# Patient Record
Sex: Female | Born: 1955 | Race: White | Hispanic: No | Smoking: Former smoker
Health system: Southern US, Community
[De-identification: ages and names within clinical notes are randomized; demographics above are authoritative.]

## PROBLEM LIST (undated history)

## (undated) DIAGNOSIS — Z803 Family history of malignant neoplasm of breast: Secondary | ICD-10-CM

## (undated) DIAGNOSIS — F329 Major depressive disorder, single episode, unspecified: Secondary | ICD-10-CM

## (undated) DIAGNOSIS — Z8051 Family history of malignant neoplasm of kidney: Secondary | ICD-10-CM

## (undated) DIAGNOSIS — F909 Attention-deficit hyperactivity disorder, unspecified type: Secondary | ICD-10-CM

## (undated) DIAGNOSIS — T4145XA Adverse effect of unspecified anesthetic, initial encounter: Secondary | ICD-10-CM

## (undated) DIAGNOSIS — D649 Anemia, unspecified: Secondary | ICD-10-CM

## (undated) DIAGNOSIS — T8859XA Other complications of anesthesia, initial encounter: Secondary | ICD-10-CM

## (undated) DIAGNOSIS — F419 Anxiety disorder, unspecified: Secondary | ICD-10-CM

## (undated) DIAGNOSIS — Z8041 Family history of malignant neoplasm of ovary: Secondary | ICD-10-CM

## (undated) DIAGNOSIS — Z800 Family history of malignant neoplasm of digestive organs: Secondary | ICD-10-CM

## (undated) DIAGNOSIS — R112 Nausea with vomiting, unspecified: Secondary | ICD-10-CM

## (undated) DIAGNOSIS — Z808 Family history of malignant neoplasm of other organs or systems: Secondary | ICD-10-CM

## (undated) DIAGNOSIS — Z9889 Other specified postprocedural states: Secondary | ICD-10-CM

## (undated) DIAGNOSIS — K589 Irritable bowel syndrome without diarrhea: Secondary | ICD-10-CM

## (undated) DIAGNOSIS — I10 Essential (primary) hypertension: Secondary | ICD-10-CM

## (undated) DIAGNOSIS — C801 Malignant (primary) neoplasm, unspecified: Secondary | ICD-10-CM

## (undated) DIAGNOSIS — F32A Depression, unspecified: Secondary | ICD-10-CM

## (undated) HISTORY — PX: FRACTURE SURGERY: SHX138

## (undated) HISTORY — DX: Anxiety disorder, unspecified: F41.9

## (undated) HISTORY — DX: Family history of malignant neoplasm of breast: Z80.3

## (undated) HISTORY — PX: APPENDECTOMY: SHX54

## (undated) HISTORY — DX: Major depressive disorder, single episode, unspecified: F32.9

## (undated) HISTORY — DX: Irritable bowel syndrome, unspecified: K58.9

## (undated) HISTORY — DX: Depression, unspecified: F32.A

## (undated) HISTORY — PX: ABDOMINAL HYSTERECTOMY: SHX81

## (undated) HISTORY — PX: BREAST SURGERY: SHX581

## (undated) HISTORY — DX: Family history of malignant neoplasm of ovary: Z80.41

## (undated) HISTORY — DX: Family history of malignant neoplasm of digestive organs: Z80.0

## (undated) HISTORY — DX: Family history of malignant neoplasm of other organs or systems: Z80.8

## (undated) HISTORY — PX: CHOLESTEATOMA EXCISION: SHX1345

## (undated) HISTORY — DX: Malignant (primary) neoplasm, unspecified: C80.1

## (undated) HISTORY — DX: Anemia, unspecified: D64.9

## (undated) HISTORY — DX: Family history of malignant neoplasm of kidney: Z80.51

## (undated) HISTORY — PX: TONSILLECTOMY: SUR1361

---
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
## 1993-04-19 HISTORY — PX: KIDNEY STONE SURGERY: SHX686

## 1993-04-19 HISTORY — PX: CHOLECYSTECTOMY: SHX55

## 1998-10-31 ENCOUNTER — Other Ambulatory Visit: Admission: RE | Admit: 1998-10-31 | Discharge: 1998-10-31 | Payer: Self-pay | Admitting: Obstetrics and Gynecology

## 1999-06-08 ENCOUNTER — Other Ambulatory Visit: Admission: RE | Admit: 1999-06-08 | Discharge: 1999-06-08 | Payer: Self-pay | Admitting: Obstetrics and Gynecology

## 1999-06-08 ENCOUNTER — Encounter (INDEPENDENT_AMBULATORY_CARE_PROVIDER_SITE_OTHER): Payer: Self-pay | Admitting: Specialist

## 1999-06-16 ENCOUNTER — Encounter (INDEPENDENT_AMBULATORY_CARE_PROVIDER_SITE_OTHER): Payer: Self-pay | Admitting: Specialist

## 1999-06-16 ENCOUNTER — Observation Stay (HOSPITAL_COMMUNITY): Admission: RE | Admit: 1999-06-16 | Discharge: 1999-06-17 | Payer: Self-pay | Admitting: Obstetrics and Gynecology

## 1999-11-17 ENCOUNTER — Other Ambulatory Visit: Admission: RE | Admit: 1999-11-17 | Discharge: 1999-11-17 | Payer: Self-pay | Admitting: Obstetrics and Gynecology

## 2000-03-17 ENCOUNTER — Encounter: Admission: RE | Admit: 2000-03-17 | Discharge: 2000-03-17 | Payer: Self-pay | Admitting: Obstetrics and Gynecology

## 2000-03-17 ENCOUNTER — Encounter: Payer: Self-pay | Admitting: Obstetrics and Gynecology

## 2000-03-18 ENCOUNTER — Encounter: Admission: RE | Admit: 2000-03-18 | Discharge: 2000-03-18 | Payer: Self-pay | Admitting: Obstetrics and Gynecology

## 2000-03-18 ENCOUNTER — Encounter: Payer: Self-pay | Admitting: Obstetrics and Gynecology

## 2000-08-12 ENCOUNTER — Encounter: Payer: Self-pay | Admitting: Obstetrics and Gynecology

## 2000-08-12 ENCOUNTER — Encounter: Admission: RE | Admit: 2000-08-12 | Discharge: 2000-08-12 | Payer: Self-pay | Admitting: Obstetrics and Gynecology

## 2000-09-07 ENCOUNTER — Encounter: Payer: Self-pay | Admitting: Obstetrics and Gynecology

## 2000-09-07 ENCOUNTER — Encounter: Admission: RE | Admit: 2000-09-07 | Discharge: 2000-09-07 | Payer: Self-pay | Admitting: Obstetrics and Gynecology

## 2001-01-09 ENCOUNTER — Encounter: Admission: RE | Admit: 2001-01-09 | Discharge: 2001-01-09 | Payer: Self-pay | Admitting: Surgery

## 2001-01-09 ENCOUNTER — Encounter: Payer: Self-pay | Admitting: Surgery

## 2007-09-22 ENCOUNTER — Encounter: Admission: RE | Admit: 2007-09-22 | Discharge: 2007-09-22 | Payer: Self-pay | Admitting: Obstetrics and Gynecology

## 2007-09-26 ENCOUNTER — Encounter: Admission: RE | Admit: 2007-09-26 | Discharge: 2007-09-26 | Payer: Self-pay | Admitting: Obstetrics and Gynecology

## 2007-09-27 ENCOUNTER — Ambulatory Visit (HOSPITAL_COMMUNITY): Admission: RE | Admit: 2007-09-27 | Discharge: 2007-09-28 | Payer: Self-pay | Admitting: Obstetrics and Gynecology

## 2008-10-16 ENCOUNTER — Encounter: Admission: RE | Admit: 2008-10-16 | Discharge: 2008-10-16 | Payer: Self-pay | Admitting: Obstetrics and Gynecology

## 2009-02-11 ENCOUNTER — Ambulatory Visit (HOSPITAL_COMMUNITY): Admission: RE | Admit: 2009-02-11 | Discharge: 2009-02-12 | Payer: Self-pay | Admitting: Orthopedic Surgery

## 2010-05-10 ENCOUNTER — Encounter: Payer: Self-pay | Admitting: Obstetrics and Gynecology

## 2010-07-23 LAB — CBC
HCT: 38.5 % (ref 36.0–46.0)
Hemoglobin: 13.3 g/dL (ref 12.0–15.0)
Platelets: 251 10*3/uL (ref 150–400)
WBC: 7.1 10*3/uL (ref 4.0–10.5)

## 2010-07-23 LAB — BASIC METABOLIC PANEL
CO2: 27 mEq/L (ref 19–32)
Calcium: 9.9 mg/dL (ref 8.4–10.5)
Creatinine, Ser: 0.76 mg/dL (ref 0.4–1.2)
GFR calc Af Amer: >60 mL/min (ref >60)
GFR calc non Af Amer: >60 mL/min (ref >60)
Potassium: 4 mEq/L (ref 3.5–5.1)
Sodium: 137 mEq/L (ref 135–145)

## 2010-09-01 NOTE — Op Note (Signed)
NAMELIBA, HULSEY              ACCOUNT NO.:  [id number]   MEDICAL RECORD NO.:  [id number]          PATIENT TYPE:  OIB   LOCATION:  9312                          FACILITY:  [facility]   PHYSICIAN:  Janine Limbo, M.D.DATE OF BIRTH:  02-17-1956   DATE OF PROCEDURE:  09/27/2007  DATE OF DISCHARGE:                               OPERATIVE REPORT   PREOPERATIVE DIAGNOSES:  1. 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  2. Stress urinary incontinence.   POSTOPERATIVE DIAGNOSES:  1. 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  2. Stress urinary incontinence.   PROCEDURE:  1. Anterior and posterior colporrhaphy.  2. Tension-free vaginal tape using the TVT Secur system.   SURGEON:  Janine Limbo, MD   FIRST ASSISTANT:  Naima A. Dillard, MD   ANESTHETIC:  General.   DISPOSITION:  Ms. Robertshaw is a 55 year old female, para 1-0-1-1, who  presents with the above-mentioned diagnosis.  She understands the  indications for her surgical procedure and she also understands the  alternative treatment options.  She accepts the risks, but not limited  to, anesthetic complications, bleeding, infections, and possible damage  to the surrounding organs.  She understands that there is a risk of  urinary retention and long-term catheterization.  She also understands  that there is a risk of mesh erosion.  The patient was given vaginal  estrogen to use prior to her surgery, but she did not use the medication  saying that she was too nervous.  We had previously discussed the risk  and benefits associated with vaginal estrogen and these risk and  benefits were again discussed immediately preoperatively.  The patient  agreed that she would use the medication postoperatively.   FINDINGS:  The patient is status post hysterectomy and therefore the  cervix and uterus were not present.  The patient had a moderate  to large  cystocele and a large rectocele present.  There was loss of  urethrovesical angle.  No adnexal masses were appreciated.   PROCEDURE:  The patient was taken to the operating room where a general  anesthetic was given.  The patient's abdomen, perineum, and vagina were  prepped with multiple layers of Betadine.  The patient was sterilely  draped.  Examination under anesthesia was performed.  A Foley catheter  was placed in the bladder.  The cystocele was then inspected.  The  vaginal mucosa beneath the bladder was then injected with a diluted  solution of Pitressin, saline, and 0.25% Marcaine.  An incision was made  in the vaginal mucosa and the vaginal mucosa was bluntly and sharply  separated from the underlying supporting fascia and bladder.  Care was  taken not to damage the bladder itself.  The dissection was carried up  to the level of urethrovesical angle.  The urethrovesical angle was then  supported using figure-of-eight sutures of 0 Vicryl.  The fascia was  reapproximated in the midline using interrupted sutures of 2-0 chromic  and 0 chromic.  At  the end of our procedure, the bladder was noted to be  well supported.  The excess vaginal mucosa was excised.  The anterior  vaginal mucosa was closed using interrupted sutures of 0 Vicryl.  Attention was then turned to the rectocele.  The vaginal mucosa that  supports the rectum was then injected with a diluted solution of  Pitressin, saline, and 0.25% Marcaine.  An incision was made in the  perineal body and the vaginal mucosa were sharply and bluntly dissected  from the underlying fascia that supports the rectum.  The fascia was  then reapproximated in the midline using interrupted sutures of 0  chromic.  The perineal body was supported using interrupted sutures of 0  Vicryl.  The excess vaginal mucosa was excised.  The vaginal mucosa was  then closed using a running suture of 0 Vicryl to the level of the  introitus.  We  then used interrupted sutures on the perineal body to  close the skin of the perineum.  The vagina was noted to be the width of  2 fingers.  Hemostasis was noted to be adequate.  We were now ready to  turn our attention to the tension-free vaginal tape portion of the  procedure.  An incision was made 1 cm from the urethral meatus for a  length of 1-1-1/2 cm.  The patient's hips were placed at a 90 degrees  angle.  The vagina was undermined at the level of the incision for  approximately 1-1/2 cm.  We then used the TVT Secur system and we angled  the first arm of the device toward the ischial pubic ramus.  We then  came in contact with the anterior edge of the pubic ramus and we  advanced the TVT Secur device into the obturator internus muscle.  An  identical procedure was carried out on the opposite side.  We checked  the tension of the vaginal tape and it was felt to be appropriate.  It  was noted to be not too loose nor too tight.  The device was then  disconnected from the inserting arms.  A final inspection was made and  again the device was felt to be in an appropriate position with  appropriate tension.  The vaginal mucosa was closed using interrupted  sutures of 0 Vicryl.  Hemostasis was noted to be adequate at this point.  The patient was given an ampule of indigo carmine dye.  The cystoscope  was inserted and the bladder was carefully inspected.  There was no  evidence of damage to the bladder and there was no evidence of mesh  material within the bladder.  Dye was noted to quickly and easily pass  through both ureteral orifices.  The Foley catheter was reinserted.  The  vagina was then packed with gauze that was soaked with estrogen vaginal  cream.  The patient was returned to the supine position.  She was  awakened from her anesthetic without difficulty.  She tolerated her  procedure well.  Sponge, needle, and instrument counts were correct on 2  occasions.  The estimated blood  loss for the procedure was 150 mL.  The  patient was noted to drain a slightly green tinged urine at this point  (blue dye plus yellow urine).  There were no specimens to be sent to  pathology.      Janine Limbo, M.D.  Electronically Signed     AVS/MEDQ  D:  09/27/2007  T:  09/28/2007  Job:  [id number]

## 2010-09-01 NOTE — H&P (Signed)
Catherine Cole, Catherine Cole              ACCOUNT NO.:  [id number]   MEDICAL RECORD NO.:  [id number]          PATIENT TYPE:  AMB   LOCATION:  SDC                           FACILITY:  [facility]   PHYSICIAN:  Janine Limbo, M.D.DATE OF BIRTH:  1955-08-31   DATE OF ADMISSION:  DATE OF DISCHARGE:                              HISTORY & PHYSICAL   HISTORY OF PRESENT ILLNESS:  Ms. Deupree is a 55 year old female, para  1-0-1-1, who presents for an anterior and posterior colporrhaphy with  placement of a TVT SECUR System .  The patient has been followed at the  [facility].  The patient complains of stress urinary incontinence  with pelvic pressure symptoms.  The patient is status post vaginal  hysterectomy from 2001.  The patient had a cholecystectomy in 1993.  She  has also had a dilatation and curettage in the past for irregular  bleeding.  The patient has had a negative urine culture.  Her gonorrhea  and chlamydia cultures are negative.   PAST MEDICAL HISTORY:  The patient has a history of anxiety and a  history of attention deficit disorder.  She has had a tonsillectomy in  the past.  The patient currently takes Valtrex for a herpes infection.   DRUG ALLERGIES:  The patient reports that she is allergic to DEMEROL,  CODEINE, and NEOSPORIN.   SOCIAL HISTORY:  The patient is a cigarette smoker.  She drinks alcohol  socially.  She denies other recreational drug uses.   OBSTETRICAL HISTORY:  The patient has had 1 vaginal delivery at term.   REVIEW OF SYSTEMS:  Please see history of present illness.   FAMILY HISTORY:  The patient's aunt had throat cancer.   PHYSICAL EXAM:  VITAL SIGNS: Weight is 167 pounds.  HEENT: Within normal limits.  CHEST: Clear.  HEART: Regular rate and rhythm.  BREASTS: Without masses.  ABDOMEN: Nontender.  EXTREMITIES: Grossly normal.  NEUROLOGIC: Grossly normal.  PELVIC: External genitalia  is normal. The vagina shows a cystocele and  rectocele with loss of urethrovesical angle. There are slight atrophic  changes present.  The cervix is surgically absent.  The uterus is  surgically absent.  Adnexa, no masses.  Rectovaginal exam confirmed and  there is good muscle tone to the rectal sphincter.   ASSESSMENT:  1. Pelvic relaxation.  2. Stress urinary incontinence.  3. Cystocele and rectocele with loss of urethrovesical angle.   PLAN:  The patient will undergo an anterior and posterior colporrhaphy  as well as placement of a tension free vaginal tape using the secured  system.  She understands the indications for her surgical procedure and  she accepts the risks, but not limited to, anesthetic complications,  bleeding, infections, and possible damage to the surrounding organs.  She understands that there is a risk for urinary retention and that she  may require a longer period of catheterization.  She also understands  that there is a risk of mesh erosion of the vagina associated with her  TVT SECUR System .  We have  started the patient on vaginal estrogen with  hopes of improving her surgical outcome and minimizing the chance of  vaginal erosion.  She will continue this postoperatively.  The patient  was also given her postoperative medications which include Darvocet-N  100 and she will take 1 tablet every 4 to 6 hours as needed for pain.  She was given 1 refill.  She was given a total of 50 tablets.  She was  given Motrin 800 mg and she will take 1 tablet q. 8h. as needed for mild  to moderate pain.  She was given 50 tablets of 1 refill.  She was given  Estrace vaginal cream 0.01%.  She was given 1 tube and she will use a  quarter an applicator each day for 2 weeks after her surgery and then  every other day for 2-4 weeks after her surgery.  She was given her  choice of Phenergan 25 mg q. 6h. p.r.n. nausea or Zofran 8 mg q. 8h.  p.r.n. nausea.      Janine Limbo,  M.D.  Electronically Signed     AVS/MEDQ  D:  09/26/2007  T:  09/27/2007  Job:  [id number]

## 2010-09-04 NOTE — Discharge Summary (Signed)
[facility]  Patient:    Catherine Cole, Catherine Cole                     MRN: [id number] Adm. Date:  [id number] Disc. Date: [id number] Attending:  Leonard Schwartz Dictator:   Henreitta Leber, P.A.                           Discharge Summary  DISCHARGE DIAGNOSES:          Fibroid uterus, menorrhagia, dysmenorrhea, and anemia.  PROCEDURE:                    On the day of admission, patient underwent a total vaginal hysterectomy with uterine morcellation.  HISTORY OF PRESENT ILLNESS:   Catherine Cole is a 55 year old female para 1-0-1-1 who presents for a vaginal hysterectomy because of menorrhagia and fibroids. Please see patients dictated History and Physical Examination for details.  PHYSICAL EXAMINATION:  HEIGHT AND WEIGHT:            Weight 152 pounds.  Height 5 feet six inches tall.  GENERAL:                      Within normal limits.  PELVIC:                       EG/BUS is within normal limits.  Vagina is normal  except there is a small cystocele and small rectocele.  The cervix is nontender and there is no lesion.  The uterus is 10-12 week size, irregular and firm.  Adnexa  without masses.  Rectovaginal exam confirms.  HOSPITAL COURSE:              On date of admission, patient underwent a total vaginal hysterectomy with uterine morcellation, tolerating procedure well. Postoperative course was unremarkable, with patient quickly tolerating a regular diet and resuming bowel and bladder functions by postoperative day #1. Postoperative hemoglobin was 9.0 (preoperative hemoglobin 11.1).  Patient was felt to have received maximum benefit of hospital stay and was therefore discharged n postoperative day #1 to home.  DISCHARGE MEDICATIONS:        1. Darvocet-N 100 1 tablet q.4-6h. for pain.                               2. Iron 325 mg 1 tablet b.i.d. for six weeks.                               3. Phenergan 25 mg 1 tablet q.6h. as needed                             for nausea.  FOLLOW-UP:                    Patient is to call [facility] for an appointment with Dr. Stefano Gaul in six weeks for postoperative evaluation.  DISCHARGE INSTRUCTIONS:       Patient was given a copy of [facility] postoperative instruction sheet.  She was further advised to call for a temperature greater 101 or any severe problems or questions.  She was further advised to avoid driving for one week, heavy lifting  for four weeks, and intercourse for six weeks.  FINAL PATHOLOGY:              A uterine cervix with benign ectocervical and endocervical tissue, benign endocervical inclusion cyst, uterine corpus with proliferative endometrium, intramural leiomyomata - multiple, and adenomyosis. DD:  07/20/99 TD:  07/20/99 Job: [id number]

## 2010-09-04 NOTE — Discharge Summary (Signed)
NAMESHELBI, VACCARO              ACCOUNT NO.:  [id number]   MEDICAL RECORD NO.:  [id number]          PATIENT TYPE:  OIB   LOCATION:  9312                          FACILITY:  [facility]   PHYSICIAN:  Janine Limbo, M.D.DATE OF BIRTH:  Jul 06, 1955   DATE OF ADMISSION:  09/27/2007  DATE OF DISCHARGE:  09/28/2007                               DISCHARGE SUMMARY   DISCHARGE DIAGNOSES:  1. Pelvic relaxation.  2. Stress urinary incontinence.  3. Cystocele and rectocele.   OPERATION:  On the day of admission, the patient underwent an anterior  and posterior colporrhaphy and placement of tension-free vaginal tape  using the SECUR systems tolerating all procedures well.   HISTORY OF PRESENT ILLNESS:  Catherine Cole is a 55 year old female para 1-  0-1-1, presenting for anterior-posterior colporrhaphy with placement of  tension-free vaginal tape using the SECUR system due to symptomatic  pelvic relaxation.  Please see the patient's dictated history and  physical examination for details.   PREOPERATIVE PHYSICAL EXAM:  Weight is 167 pounds.  General exam was  within normal limits.  Pelvic exam external genitalia was normal.  Vagina showed a cystocele and rectocele with loss of the urethrovesical  angle.  There was slight atrophic changes present.  The cervix was  surgically absent.  Uterus was surgically absent.  Adnexa did not reveal  any masses and the rectovaginal exam confirmed that there was good  muscle tone to the rectal sphincter.   HOSPITAL COURSE:  On the day of admission, the patient underwent  aforementioned procedures tolerating them all well.  The patient  tolerated postop hemoglobin of 11.2 (preop hemoglobin 13.9) and had  resumed bowel and bladder function by postop day #1.  Having received  the maximum benefit of her hospital stay, the patient was therefore  discharged on postop day #1.   DISCHARGE MEDICATIONS:  1. Motrin 800 mg 1 tablet every 8 hours as needed.  2.  Darvocet-N 100 one tablet every 4-6 hours as needed for pain.  3. Zofran 8 mg 1 tablet every 8 hours as needed for nausea.  4. Estrace vaginal cream, the patient is to use 1/4 applicator full      placed on her finger applied inside the vagina day and night for 2      weeks, then every other day for 4 weeks.   FOLLOWUP:  The patient is to call [facility] at [phone] to  schedule a 6 weeks postoperative visit with Dr. Stefano Cole.   DISCHARGE INSTRUCTIONS:  1. The patient was given a copy of [facility]      postoperative instruction sheet.  2. She was further advised to avoid driving for 1-2 weeks, heavy      lifting for 4 weeks, intercourse for 6 weeks, but      she may shower, she may walk up steps.  3. She should increase her activities slowly.  4. Her diet was without restriction.      Catherine Cole.      Janine Limbo, M.D.  Electronically Signed    EJP/MEDQ  D:  10/12/2007  T:  10/13/2007  Job:  [id number]

## 2010-09-04 NOTE — Op Note (Signed)
[facility]  Patient:    Catherine Cole, Catherine Cole                     MRN: [id number] Proc. Date: 06/16/99 Adm. Date:  [id number] Attending:  Leonard Schwartz                           Operative Report  PREOPERATIVE DIAGNOSIS:       Fibroid uterus.  Menorrhagia.  Dysmenorrhea. Anemia (hemoglobin 11.1).  POSTOPERATIVE DIAGNOSIS:      Fibroid uterus.  Menorrhagia.  Dysmenorrhea. Anemia (hemoglobin 11.1).  OPERATION:                    Total vaginal hysterectomy with uterine morsilation.  SURGEON:                      Janine Limbo, M.D.  ASSISTANT:                    Henreitta Leber, P.A.  ANESTHESIA:                   Epidural.  ESTIMATED BLOOD LOSS:  INDICATIONS:                  [name] is a 55 year old who presents with the abovementioned diagnoses.  Her endometrial biopsy was negative.  She understands the indications for her procedure and she accepts the risks of, but not limited to, anesthetic complications, bleeding, infection, and possible damage to the surrounding organs.  FINDINGS:                     The uterus was 12 to 14 weeks size and it contained multiple fibroids.  The fallopian tubes and ovaries appeared normal.  DESCRIPTION OF PROCEDURE:     The patient was taken to the operating room where an epidural catheter was placed.  The patients abdomen, perineum, and vagina were prepped with multiple layers of Betadine.  A Foley catheter was placed in the bladder.  The patient was sterilely draped.  The cervix was injected with a diluted solution of Pitressin and saline.  A circumferential incision was made around the cervix and the mucosa was advanced both anteriorly and posteriorly.  The anterior cul-de-sac and then the posterior cul-de-sac were sharply entered.  Alternating  from left to right, the uterosacral ligaments, paracervical tissues, parametrial tissues, and uterine arteries were clamped, cut,  sutured, and tied securely. We were unable to invert the uterus through the posterior colpotomy.  Progressive uterine morsilation was required with removal of fibroids and uterine tissue so  that we could remove the large uterus.  Bleeding was scant.  We were finally able to invert the uterus through the posterior colpotomy and the upper pedicles were secured.  The uterus was transected from our operative field.  The upper pedicles were then free-tied, and suture ligated.  Hemostasis was noted to be adequate. The sutures attached to the uterosacral ligaments were brought out through the vaginal angles and tied securely.  A McCall culdoplasty suture was placed in the posterior cul-de-sac incorporating the uterosacral ligaments and the posterior peritoneum.  A final check was made for hemostasis and again hemostasis was noted to be adequate. Sponge, needle, and instrument counts were correct x 2 occasions.  All instruments were removed.  The vaginal cuff was closed using figure-of-eight sutures.  The [employer]  culdoplasty suture was tied securely and the apex of the vagina was noted to elevate into the midpelvis.  0 Vicryl was the suture material used throughout the procedure.  The patient was noted to drain clear yellow urine at the end of her  procedure.  The patient was taken to the recovery room in stable condition. She tolerated her procedure well. DD:  06/16/99 TD:  06/16/99 Job: [id number]

## 2011-01-14 LAB — BASIC METABOLIC PANEL
BUN: 15
Chloride: 98
GFR calc Af Amer: >60
GFR calc non Af Amer: >60
Potassium: 4

## 2011-01-14 LAB — URINALYSIS, ROUTINE W REFLEX MICROSCOPIC
Nitrite: NEGATIVE
Protein, ur: NEGATIVE
Urobilinogen, UA: 0.2
pH: 5

## 2011-01-14 LAB — CBC
HCT: 39.3
Hemoglobin: 11.2 — ABNORMAL LOW
MCHC: 35.5
MCV: 90.8
RBC: 3.47 — ABNORMAL LOW
RDW: 12.4
WBC: 10.3
WBC: 6.7

## 2011-01-14 LAB — URINE MICROSCOPIC-ADD ON

## 2011-07-01 ENCOUNTER — Other Ambulatory Visit: Payer: Self-pay | Admitting: Physician Assistant

## 2011-07-01 MED ORDER — ESCITALOPRAM OXALATE 20 MG PO TABS: 20.0000 mg | ORAL_TABLET | Freq: Every day | ORAL | Status: DC

## 2011-07-07 ENCOUNTER — Ambulatory Visit (INDEPENDENT_AMBULATORY_CARE_PROVIDER_SITE_OTHER): Payer: BC Managed Care – PPO | Admitting: Physician Assistant

## 2011-07-07 VITALS — BP 155/92 | HR 74 | Temp 98.5°F | Resp 16 | Ht 65.0 in | Wt 180.2 lb

## 2011-07-07 DIAGNOSIS — F339 Major depressive disorder, recurrent, unspecified: Secondary | ICD-10-CM

## 2011-07-07 DIAGNOSIS — F909 Attention-deficit hyperactivity disorder, unspecified type: Secondary | ICD-10-CM

## 2011-07-07 DIAGNOSIS — F419 Anxiety disorder, unspecified: Secondary | ICD-10-CM

## 2011-07-07 DIAGNOSIS — F988 Other specified behavioral and emotional disorders with onset usually occurring in childhood and adolescence: Secondary | ICD-10-CM

## 2011-07-07 DIAGNOSIS — F411 Generalized anxiety disorder: Secondary | ICD-10-CM

## 2011-07-07 DIAGNOSIS — F329 Major depressive disorder, single episode, unspecified: Secondary | ICD-10-CM

## 2011-07-07 MED ORDER — AMPHETAMINE-DEXTROAMPHETAMINE 10 MG PO TABS: 10.0000 mg | ORAL_TABLET | Freq: Two times a day (BID) | ORAL | Status: DC

## 2011-07-07 NOTE — Progress Notes (Signed)
  Subjective:    Patient ID: Catherine Cole, female    DOB: 17-Jul-1955, 56 y.o.   MRN: [id number]  HPI  Catherine Cole comes in today to discuss reinitiating ADD meds. Currently on Lexapro and feels "happy" but overwhelmed all the time.  She had been tested ~ 20 yrs ago for ADD (no documentation provided) and put on Ritalin and then switched to Adderall up until ~ 6yrs ago.  Husband died 9 yrs ago and lost everything.  She had to stop all her meds for depression and ADD because of financial reasons.  She is now doing very well and is working and going to school at [employer] time.  She will graduate this May.  While she is doing well with lexapro, she feels that she has been unable to focus and accomplish tasks and assignments.  She is not sleeping well. In addition to Lexapro, she was also put on Wellbutrin and was on all 3 meds at one time. She had also used Vitamins that she says helped.  She had stopped taking HCTZ for pedal edema as she made diet changes that reduced her Sodium intake.  She states she has never had HTN.    Review of Systems  Constitutional: Positive for fatigue and unexpected weight change. Negative for appetite change.  Cardiovascular: Positive for leg swelling. Negative for chest pain and palpitations.  Neurological: Negative for dizziness and light-headedness.  Psychiatric/Behavioral: Positive for sleep disturbance. Negative for suicidal ideas and self-injury. The patient is nervous/anxious.        Objective:   Physical Exam  Constitutional: She is oriented to person, place, and time. She appears well-developed and well-nourished.  Cardiovascular: Normal rate and regular rhythm.   Pulmonary/Chest: Effort normal.  Neurological: She is alert and oriented to person, place, and time.  Skin: Skin is warm.        Assessment & Plan:  ADD Depression Anxiety  Continue Lexapro Add Adderall 10 mg bid.  Pt to call for RF in one month and notify us of her status.   She will need CPE in the next 2-3 months when classes finish. She is aggreable to this.

## 2011-07-07 NOTE — Patient Instructions (Signed)
Call in one month for refill and status check Plan on scheduling a complete physical in 2-3 months.

## 2011-07-15 ENCOUNTER — Telehealth: Payer: Self-pay

## 2011-07-15 NOTE — Telephone Encounter (Signed)
Pt pick up rx for adderall on Thursday of last week and went to pharmacy and pharmacy states they need authorization from her insurance this info was faxed to [facility], pt trying to figure out whats the hold up.Marland Kitchen

## 2011-07-19 NOTE — Telephone Encounter (Signed)
Called [employer] and prior Berkley Harvey was approved [id number]).  Patient notfied and will notify pharmacy.

## 2011-07-19 NOTE — Telephone Encounter (Signed)
Yes it is attached to her chart.

## 2011-08-22 ENCOUNTER — Telehealth: Payer: Self-pay

## 2011-08-22 ENCOUNTER — Ambulatory Visit (INDEPENDENT_AMBULATORY_CARE_PROVIDER_SITE_OTHER): Payer: BC Managed Care – PPO | Admitting: Family Medicine

## 2011-08-22 ENCOUNTER — Encounter: Payer: Self-pay | Admitting: Family Medicine

## 2011-08-22 VITALS — BP 131/79 | HR 87 | Temp 98.4°F | Resp 16 | Ht 66.0 in | Wt 177.0 lb

## 2011-08-22 DIAGNOSIS — R609 Edema, unspecified: Secondary | ICD-10-CM

## 2011-08-22 DIAGNOSIS — F411 Generalized anxiety disorder: Secondary | ICD-10-CM

## 2011-08-22 DIAGNOSIS — R600 Localized edema: Secondary | ICD-10-CM

## 2011-08-22 DIAGNOSIS — F419 Anxiety disorder, unspecified: Secondary | ICD-10-CM

## 2011-08-22 DIAGNOSIS — F988 Other specified behavioral and emotional disorders with onset usually occurring in childhood and adolescence: Secondary | ICD-10-CM

## 2011-08-22 MED ORDER — AMPHETAMINE-DEXTROAMPHETAMINE 10 MG PO TABS: 10.0000 mg | ORAL_TABLET | Freq: Two times a day (BID) | ORAL | Status: DC

## 2011-08-22 MED ORDER — HYDROCHLOROTHIAZIDE 12.5 MG PO CAPS: 12.5000 mg | ORAL_CAPSULE | Freq: Every day | ORAL | Status: DC

## 2011-08-22 NOTE — Telephone Encounter (Signed)
Pt was told she would need to come for a refill on adderall due to her having finals for her classed and working a ft job she has'nt been able to make an appt and would like to see if she could get a rx refilll on her adderall and make an appt at a later date.

## 2011-08-22 NOTE — Progress Notes (Signed)
  Subjective:    Patient ID: Catherine Cole, female    DOB: 05/07/1955, 56 y.o.   MRN: [id number]  HPI Catherine Cole is a 56 y.o. female Here for multiple concerns today;  See last ov 07/07/11: ADD, Depression, Anxiety.  Continued Lexapro Added Adderall 10 mg bid.    Per last office visit: overwhelmed all the time.  She had been tested ~ 20 yrs ago for ADD (no documentation provided) and put on Ritalin and then switched to Adderall up until ~ 36yrs ago.  Husband died 9 yrs ago and lost everything.  She had to stop all her meds for depression and ADD because of financial reasons.  She is now doing very well and is working and going to school at [employer] time.  She will graduate this May.  While she is doing well with lexapro, she feels that she has been unable to focus and accomplish tasks and assignments.  She is not sleeping well. In addition to Lexapro, she was also put on Wellbutrin and was on all 3 meds at one time. She had also used Vitamins that she says helped. Per last note plan for CPE as classes finish. Working and school full time. [employer]. Has been doing well on current dose of Adderall.  Able to focus much better, more calm.  No side effects.  No appetite difficulty. No insomnia, no chest pain/palpitations.  Sleeping better. Has been able to lose a few pounds as not eating late at night.  wiser food choices.  Fluid retention in legs - prior started on HCTZ for pedal edema, had normal doppler in 2010. No recent hctz - off x 6 months.  Noticing some fluid in ankles.  Trying to prop up feet during day.  Anxiety - on Lexapro 10mg  QD. Doing well.  No depression, no SI. Not feeling anxiety and panic symptoms as in past. No manic symptoms.    Review of Systems  Constitutional: Negative for fever and chills.  Respiratory: Negative for chest tightness and shortness of breath.   Cardiovascular: Positive for leg swelling. Negative for palpitations.       Swelling at ankles.   and as per HPI.    Objective:   Physical Exam  Constitutional: She is oriented to person, place, and time. She appears well-developed and well-nourished.  HENT:  Head: Normocephalic and atraumatic.  Cardiovascular: Normal rate, regular rhythm, normal heart sounds and intact distal pulses.   No murmur heard. Pulmonary/Chest: Effort normal and breath sounds normal.  Musculoskeletal:       Trace/minimal edema at ankles only. No pretibial edema or rash.  Neurological: She is alert and oriented to person, place, and time.  Skin: Skin is warm and dry.  Psychiatric: She has a normal mood and affect. Her behavior is normal.       Assessment & Plan:    ADD - well controlled  By report.  Cont adderall at current dose - 10mg  BID. 2 months Rx given.  Plan to follow up for CPE for continued RX's.  Can continue as helping at work as well.   Anxiety - well controlled by report.  Cont lexapro at current dose.  Continue healthy habits for weight loss.  Trace pedal edema - minimal sx's currently.  Can continue to elevate legs, and if not improving as less walking/standing can restart HCTZ 12.5mg  QD prn. SED, orthostatic precautions.  Recheck next 2 months for CPE.

## 2011-08-22 NOTE — Telephone Encounter (Signed)
ADVISED PT THAT SHE CAN COME IN TODAY AND BE SEEN. PT AGREED

## 2011-10-21 ENCOUNTER — Other Ambulatory Visit: Payer: Self-pay | Admitting: Family Medicine

## 2011-11-25 ENCOUNTER — Telehealth: Payer: Self-pay

## 2011-11-25 NOTE — Telephone Encounter (Signed)
PT REQUESTING ADDERALL REFILL  BEST PHONE [phone]

## 2011-11-26 MED ORDER — AMPHETAMINE-DEXTROAMPHETAMINE 10 MG PO TABS: 10.0000 mg | ORAL_TABLET | Freq: Two times a day (BID) | ORAL | Status: DC

## 2011-11-26 NOTE — Telephone Encounter (Signed)
Patient is due for follow up according to Dr Neva Seat note she needs a physical, I have spoken to her and she has no plans for this due to busy time at work, would be September before she could get in for this, I told her I will check and see if we can renew until then. Please advise.

## 2011-11-26 NOTE — Telephone Encounter (Signed)
Pt called again, she scheduled an appt with Dr. Audria Nine on September 4 for CPE, but would like to still get the Adderall refilled.  Pt has 2 numbers, [phone] or [phone].

## 2011-11-26 NOTE — Telephone Encounter (Signed)
LMOM notifying patient rx in pick up drawer.

## 2011-11-26 NOTE — Telephone Encounter (Signed)
At TL desk 

## 2011-12-07 ENCOUNTER — Telehealth: Payer: Self-pay

## 2011-12-07 ENCOUNTER — Ambulatory Visit (INDEPENDENT_AMBULATORY_CARE_PROVIDER_SITE_OTHER): Payer: BC Managed Care – PPO | Admitting: Family Medicine

## 2011-12-07 ENCOUNTER — Ambulatory Visit: Payer: BC Managed Care – PPO

## 2011-12-07 VITALS — BP 136/80 | HR 88 | Temp 98.7°F | Resp 18 | Ht 65.5 in | Wt 164.8 lb

## 2011-12-07 DIAGNOSIS — R5383 Other fatigue: Secondary | ICD-10-CM

## 2011-12-07 DIAGNOSIS — R61 Generalized hyperhidrosis: Secondary | ICD-10-CM

## 2011-12-07 DIAGNOSIS — R079 Chest pain, unspecified: Secondary | ICD-10-CM

## 2011-12-07 DIAGNOSIS — IMO0001 Reserved for inherently not codable concepts without codable children: Secondary | ICD-10-CM

## 2011-12-07 DIAGNOSIS — R5381 Other malaise: Secondary | ICD-10-CM

## 2011-12-07 DIAGNOSIS — R0989 Other specified symptoms and signs involving the circulatory and respiratory systems: Secondary | ICD-10-CM

## 2011-12-07 DIAGNOSIS — R0609 Other forms of dyspnea: Secondary | ICD-10-CM

## 2011-12-07 DIAGNOSIS — Z13220 Encounter for screening for lipoid disorders: Secondary | ICD-10-CM

## 2011-12-07 DIAGNOSIS — R0600 Dyspnea, unspecified: Secondary | ICD-10-CM

## 2011-12-07 DIAGNOSIS — R110 Nausea: Secondary | ICD-10-CM

## 2011-12-07 LAB — LIPID PANEL
Cholesterol: 219 mg/dL — ABNORMAL HIGH (ref 0–200)
HDL: 43 mg/dL (ref >39)
LDL Cholesterol: 142 mg/dL — ABNORMAL HIGH (ref 0–99)
Total CHOL/HDL Ratio: 5.1 ratio
Triglycerides: 170 mg/dL — ABNORMAL HIGH (ref <150)
VLDL: 34 mg/dL (ref 0–40)

## 2011-12-07 LAB — COMPREHENSIVE METABOLIC PANEL WITH GFR
BUN: 16 mg/dL (ref 6–23)
Calcium: 9.8 mg/dL (ref 8.4–10.5)
Chloride: 100 meq/L (ref 96–112)
Creat: 0.76 mg/dL (ref 0.50–1.10)
Sodium: 139 meq/L (ref 135–145)
Total Bilirubin: 0.5 mg/dL (ref 0.3–1.2)

## 2011-12-07 LAB — POCT CBC
Granulocyte percent: 61.9 % (ref 37–80)
HCT, POC: 41 % (ref 37.7–47.9)
Hemoglobin: 12.7 g/dL (ref 12.2–16.2)
Lymph, poc: 2.1 (ref 0.6–3.4)
MCH, POC: 27.7 pg (ref 27–31.2)
MCHC: 31 g/dL — AB (ref 31.8–35.4)
MCV: 89.6 fL (ref 80–97)
MID (cbc): 0.5 (ref 0–0.9)
MPV: 9.6 fL (ref 0–99.8)
POC Granulocyte: 4.2 (ref 2–6.9)
POC LYMPH PERCENT: 30.8 % (ref 10–50)
POC MID %: 7.3 %M (ref 0–12)
Platelet Count, POC: 282 10*3/uL (ref 142–424)
RBC: 4.58 M/uL (ref 4.04–5.48)
RDW, POC: 13.7 %
WBC: 6.8 10*3/uL (ref 4.6–10.2)

## 2011-12-07 LAB — COMPREHENSIVE METABOLIC PANEL
ALT: 12 U/L (ref 0–35)
AST: 11 U/L (ref 0–37)
Albumin: 4.5 g/dL (ref 3.5–5.2)
Alkaline Phosphatase: 79 U/L (ref 39–117)
CO2: 29 mEq/L (ref 19–32)
Glucose, Bld: 92 mg/dL (ref 70–99)
Potassium: 4.2 mEq/L (ref 3.5–5.3)
Total Protein: 6.8 g/dL (ref 6.0–8.3)

## 2011-12-07 LAB — TSH: TSH: 1.372 u[IU]/mL (ref 0.350–4.500)

## 2011-12-07 MED ORDER — CYCLOBENZAPRINE HCL 10 MG PO TABS: 10.0000 mg | ORAL_TABLET | Freq: Three times a day (TID) | ORAL | Status: AC | PRN

## 2011-12-07 MED ORDER — MELOXICAM 7.5 MG PO TABS: 7.5000 mg | ORAL_TABLET | Freq: Every day | ORAL | Status: DC

## 2011-12-07 NOTE — Progress Notes (Signed)
[facility]:  Office Visit  Chief Complaint:  Chief Complaint  Patient presents with  . Back Pain    Upper back tight, chills, nausea. Cold sweats. Think she had heart attack 1  week ago.    HPI: Catherine Cole is a 56 y.o. female who complains of multiple medical issues: 1. Back spasms, bilateral shoulder spasms. Described as intermittent tightness.  2. Woke up Friday night with HA, bilateral. HA is now dull ache.  3. High blood pressure 180/ 89 on CV[facility] BP cuff, however, here it is normal. This is associated with localized chest pressure at rest, x1, lasting seconds-minutes. Has had acid reflux symptoms.  + nausea, dizziness, HAs 1 week ago on Monday while she was working on a ladder. No recent illnesses. Denies stroke like sxs. Patient takes HCTZ for pedal edema Denies h/o hyperlipidemia, diabetes, thyroid disease FHx: Heart attack mother at age 24 or late 44s.   Past Medical History  Diagnosis Date  . Depression   . Anxiety    History reviewed. No pertinent past surgical history. History   Social History  . Marital Status: Widowed    Spouse Name: N/A    Number of Children: N/A  . Years of Education: N/A   Social History Main Topics  . Smoking status: Former Games developer  . Smokeless tobacco: None  . Alcohol Use: None  . Drug Use: None  . Sexually Active: None   Other Topics Concern  . None   Social History Narrative  . None   Family History  Problem Relation Age of Onset  . Heart disease Mother   . Alcohol abuse Father   . Diabetes Father    Allergies  Allergen Reactions  . Demerol Nausea And Vomiting  . Erythromycin Itching and Rash  . Neosporin (Neomycin-Polymyxin B Gu) Itching and Rash   Prior to Admission medications   Medication Sig Start Date End Date Taking? Authorizing Provider  amphetamine-dextroamphetamine (ADDERALL) 10 MG tablet Take 1 tablet (10 mg total) by mouth 2 (two) times daily. 11/26/11  Yes Morrell Riddle, PA-C    escitalopram (LEXAPRO) 20 MG tablet Take 20 mg by mouth daily.   Yes Historical Provider, MD  hydrochlorothiazide (MICROZIDE) 12.5 MG capsule TAKE ONE CAPSULE EVERY DAY 10/21/11  Yes Heather M Marte, PA-C  amphetamine-dextroamphetamine (ADDERALL, 10MG ,) 10 MG tablet Take 1 tablet (10 mg total) by mouth 2 (two) times daily. 07/07/11 08/06/11  Pattricia Boss, PA-C  escitalopram (LEXAPRO) 20 MG tablet Take 1 tablet (20 mg total) by mouth daily. 07/01/11 09/29/11  Raymon Mutton Dunn, PA-C     ROS: The patient denies fevers, chills, night sweats, unintentional weight loss, wheezing, dyspnea on exertion,  vomiting, abdominal pain, dysuria, hematuria, melena, numbness, weakness, or tingling.   All other systems have been reviewed and were otherwise negative with the exception of those mentioned in the HPI and as above.    PHYSICAL EXAM: Filed Vitals:   12/07/11 1354  BP: 136/80  Pulse: 88  Temp: 98.7 F (37.1 C)  Resp: 18   Filed Vitals:   12/07/11 1354  Height: 5' 5.5" (1.664 m)  Weight: 164 lb 12.8 oz (74.753 kg)   Body mass index is 27.01 kg/(m^2).  General: Alert, no acute distress HEENT:  Normocephalic, atraumatic, oropharynx patent. TM normal. EOMI, PERRLA, fundoscopic exam nl Cardiovascular:  Regular rate and rhythm, no rubs murmurs or gallops.  No Carotid bruits, radial pulse intact. No pedal edema.  Respiratory: Clear to  auscultation bilaterally.  No wheezes, rales, or rhonchi.  No cyanosis, no use of accessory musculature GI: No organomegaly, abdomen is soft and non-tender, positive bowel sounds.  No masses. Skin: No rashes. Neurologic: Facial musculature symmetric. CN 2-12 nl Psychiatric: Patient is appropriate throughout our interaction. Lymphatic: No cervical lymphadenopathy. No thyroid megaly Musculoskeletal: Gait intact.   LABS: Results for orders placed in visit on 12/07/11  POCT CBC      Component Value Range   WBC 6.8  4.6 - 10.2 K/uL   Lymph, poc 2.1  0.6 - 3.4   POC  LYMPH PERCENT 30.8  10 - 50 %L   MID (cbc) 0.5  0 - 0.9   POC MID % 7.3  0 - 12 %M   POC Granulocyte 4.2  2 - 6.9   Granulocyte percent 61.9  37 - 80 %G   RBC 4.58  4.04 - 5.48 M/uL   Hemoglobin 12.7  12.2 - 16.2 g/dL   HCT, POC 45.4  09.8 - 47.9 %   MCV 89.6  80 - 97 fL   MCH, POC 27.7  27 - 31.2 pg   MCHC 31.0 (*) 31.8 - 35.4 g/dL   RDW, POC 11.9     Platelet Count, POC 282  142 - 424 K/uL   MPV 9.6  0 - 99.8 fL     EKG/XRAY:   Primary read interpreted by Dr. Conley Rolls at [facility] Negative fx/dislocation on CXR EKG override read, no ST-T wave elevation/depression   ASSESSMENT/PLAN: Encounter Diagnoses  Name Primary?  . Chest pain Yes  . Dyspnea   . Fatigue   . Nausea   . Sweating   . Screening cholesterol level    56 year old white female with  multiple medical complaints. Her biggest concern is that she might have had a heart attack based on her sxs and " what I read on the internet" 1 week ago. She denies risk factors ie HTN, DM, XOl. + h/o prior tobacco use and ? Mother with MI in 50s/60s.    1. Msk sprain/strain shoulders-Rx Flexeril, Mobic 2. Chest Pain-EKG nl, and CXR nl. Possibly related to GERD, anxiety or msk pain. Advise that if she starts feeling like that again and/or have worsening sxs then she needs to go to ER.  Labs pending    Rockne Coons, DO 12/07/2011 3:56 PM

## 2011-12-10 ENCOUNTER — Telehealth: Payer: Self-pay | Admitting: Family Medicine

## 2011-12-10 NOTE — Telephone Encounter (Signed)
LM regarding lab results. Cholesterol high, would recommend diet and exercise. If no improvement in 6 months then consider statin

## 2011-12-21 ENCOUNTER — Encounter: Payer: Self-pay | Admitting: Family Medicine

## 2011-12-22 ENCOUNTER — Encounter: Payer: BC Managed Care – PPO | Admitting: Family Medicine

## 2012-01-11 ENCOUNTER — Other Ambulatory Visit: Payer: Self-pay | Admitting: Physician Assistant

## 2012-01-28 ENCOUNTER — Other Ambulatory Visit: Payer: Self-pay | Admitting: Obstetrics and Gynecology

## 2012-01-28 DIAGNOSIS — Z1231 Encounter for screening mammogram for malignant neoplasm of breast: Secondary | ICD-10-CM

## 2012-02-06 ENCOUNTER — Telehealth: Payer: Self-pay

## 2012-02-06 NOTE — Telephone Encounter (Signed)
Pt is requesting rx refill on adderall please call when ready for pick-up [phone]

## 2012-02-07 MED ORDER — AMPHETAMINE-DEXTROAMPHETAMINE 10 MG PO TABS: 10.0000 mg | ORAL_TABLET | Freq: Two times a day (BID) | ORAL | Status: DC

## 2012-02-07 NOTE — Telephone Encounter (Signed)
Patient advised. She will come in before this runs out.

## 2012-02-07 NOTE — Telephone Encounter (Signed)
Rx done and ready for pickup. Needs office visit for additional refills

## 2012-02-14 ENCOUNTER — Ambulatory Visit
Admission: RE | Admit: 2012-02-14 | Discharge: 2012-02-14 | Disposition: A | Discharge status: Home / Self Care | Payer: BC Managed Care – PPO | Source: Ambulatory Visit | Attending: Obstetrics and Gynecology | Admitting: Obstetrics and Gynecology

## 2012-02-14 DIAGNOSIS — Z1231 Encounter for screening mammogram for malignant neoplasm of breast: Secondary | ICD-10-CM

## 2012-02-16 ENCOUNTER — Encounter: Payer: Self-pay | Admitting: Obstetrics and Gynecology

## 2012-02-23 ENCOUNTER — Ambulatory Visit: Payer: Self-pay | Admitting: Obstetrics and Gynecology

## 2012-04-15 ENCOUNTER — Other Ambulatory Visit: Payer: Self-pay | Admitting: Family Medicine

## 2012-04-17 ENCOUNTER — Telehealth: Payer: Self-pay

## 2012-04-17 NOTE — Telephone Encounter (Signed)
Advised pt that she should have another 90 day RF of her Lexapro at pharmacy. Pt agreed to call pharmacy and let us know if there are any problems getting this.  Dr Audria Nine, I advised pt that she needs OV before more RFs of Adderall (she was notified of this in Oct when last RFd). Pt stated that she has not been able to RTC before now d/t financial reasons and has tried to make her Adderall last, but she is out. Pt has an appt set up w/you on 04/25/12, but requests a RF of the Adderall now, if possible.

## 2012-04-17 NOTE — Telephone Encounter (Signed)
Pt would like a refill on adderall and lexapro. Best# [phone]

## 2012-04-18 NOTE — Telephone Encounter (Signed)
I will wait to address Adderall refill at office visit (doing a refill now will create confusion re: refills going forward).

## 2012-04-19 NOTE — Telephone Encounter (Signed)
Called patient to advise  °

## 2012-04-25 ENCOUNTER — Encounter: Payer: Self-pay | Admitting: Family Medicine

## 2012-04-25 ENCOUNTER — Ambulatory Visit (INDEPENDENT_AMBULATORY_CARE_PROVIDER_SITE_OTHER): Payer: BC Managed Care – PPO | Admitting: Family Medicine

## 2012-04-25 VITALS — BP 144/90 | HR 90 | Temp 99.2°F | Resp 16 | Ht 65.75 in | Wt 167.8 lb

## 2012-04-25 DIAGNOSIS — F909 Attention-deficit hyperactivity disorder, unspecified type: Secondary | ICD-10-CM

## 2012-04-25 DIAGNOSIS — F341 Dysthymic disorder: Secondary | ICD-10-CM

## 2012-04-25 DIAGNOSIS — F418 Other specified anxiety disorders: Secondary | ICD-10-CM

## 2012-04-25 DIAGNOSIS — L299 Pruritus, unspecified: Secondary | ICD-10-CM

## 2012-04-25 MED ORDER — AMPHETAMINE-DEXTROAMPHETAMINE 10 MG PO TABS: 10.0000 mg | ORAL_TABLET | Freq: Two times a day (BID) | ORAL | Status: DC

## 2012-04-25 MED ORDER — ESCITALOPRAM OXALATE 20 MG PO TABS: ORAL_TABLET | ORAL | Status: DC

## 2012-04-25 MED ORDER — PRAMOXINE-HC-CHLOROXYLENOL AQ 10-10-1 MG/ML OT SOLN: OTIC | Status: DC

## 2012-04-26 DIAGNOSIS — F909 Attention-deficit hyperactivity disorder, unspecified type: Secondary | ICD-10-CM | POA: Insufficient documentation

## 2012-04-26 DIAGNOSIS — F418 Other specified anxiety disorders: Secondary | ICD-10-CM | POA: Insufficient documentation

## 2012-04-26 NOTE — Progress Notes (Signed)
S: his 57 y.o. Cauc female has Depression/Anxiety which is under good control w/ Lexapro (generic). Pt her husband died and she now lives alone; she recently took in a stray cat that her sister gave her.  She also has ADHD, treated w/ Adderall 10 mg bid most days of the week, depending on schedule. She works for [employer] in [employer]. She had no medication side effects such as fatigue, unexplained weight loss, CP or tightness, palpitations, SOB or DOE, GI upset or decreased appetite, HA, dizziness, weakness, tremor or syncope. Sleep is adequate.  Pt does reports some disorder and clutter in her home; she plans to address this by boxing up items that are scattered about on counters, table surfaces and elsewhere. She acknowledges that is is very disorganized in her home but states, 'It really doesn't bother me". This came about because she down-sized after her husband's death; she still needs to sort through her belongings to decide what to keep and what to discard.  ROS: As per HPI. 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  O:  Filed Vitals:   04/25/12 1621  BP: 144/90  Pulse: 90  Temp: 99.2 F (37.3 C)  Resp: 16   GEN: In NAD; WN,WD. HENT: [place]/AT; EOMI w/ clear conj and sclerae. Oroph clear and moist. EACs w/ no cerumen; TMs dull. NECK: Supple. COR: RRR. LUNGS: Normal resp rate and effort. NEURO: A&O x 3; CNs intact. Mentation- pt is conversant but does tend to jump from one topic to another. Behaviior is normal; she does not exhibit any abnormal thoughts; judgement is normal.  A/P:  1. ADHD (attention deficit hyperactivity disorder)   RFs: Adderall 10 mg  1 tab bid x 3 months.  2. Depression with anxiety    Continue Lexapro.  3. Localized pruritus              RX: Cortane -B Aqueous Otic gtts; pt plans to sch f/u w/ Dr. Ermalinda Barrios.   Pt requests Influenza vaccine but had "flu" at Christmas time; advised return in  2-3 weeks for immunization.

## 2012-04-26 NOTE — Progress Notes (Signed)
Completed prior auth for pt's adderall 10 mg over the phone and received approval from 04/19/12 - 05/10/13. Faxed approval notice to pharmacy and notified pt through reply to MyChart email.

## 2012-05-23 ENCOUNTER — Other Ambulatory Visit: Payer: Self-pay | Admitting: Family Medicine

## 2012-06-03 ENCOUNTER — Other Ambulatory Visit: Payer: Self-pay

## 2012-07-15 ENCOUNTER — Other Ambulatory Visit: Payer: Self-pay | Admitting: Physician Assistant

## 2012-07-25 ENCOUNTER — Ambulatory Visit: Payer: Self-pay | Admitting: Family Medicine

## 2012-07-27 ENCOUNTER — Ambulatory Visit (INDEPENDENT_AMBULATORY_CARE_PROVIDER_SITE_OTHER): Payer: BC Managed Care – PPO | Admitting: Family Medicine

## 2012-07-27 ENCOUNTER — Encounter: Payer: Self-pay | Admitting: Family Medicine

## 2012-07-27 VITALS — BP 142/78 | HR 89 | Temp 98.8°F | Resp 16 | Ht 65.5 in | Wt 170.0 lb

## 2012-07-27 DIAGNOSIS — L989 Disorder of the skin and subcutaneous tissue, unspecified: Secondary | ICD-10-CM

## 2012-07-27 DIAGNOSIS — Z760 Encounter for issue of repeat prescription: Secondary | ICD-10-CM

## 2012-07-27 DIAGNOSIS — F988 Other specified behavioral and emotional disorders with onset usually occurring in childhood and adolescence: Secondary | ICD-10-CM

## 2012-07-27 DIAGNOSIS — F909 Attention-deficit hyperactivity disorder, unspecified type: Secondary | ICD-10-CM

## 2012-07-27 MED ORDER — AMPHETAMINE-DEXTROAMPHETAMINE 10 MG PO TABS: 10.0000 mg | ORAL_TABLET | Freq: Two times a day (BID) | ORAL | Status: DC

## 2012-07-27 MED ORDER — HYDROCHLOROTHIAZIDE 12.5 MG PO CAPS: ORAL_CAPSULE | ORAL | Status: DC

## 2012-07-27 MED ORDER — VALACYCLOVIR HCL 500 MG PO TABS: ORAL_TABLET | ORAL | Status: DC

## 2012-07-27 MED ORDER — PRAMOXINE-HC-CHLOROXYLENOL AQ 10-10-1 MG/ML OT SOLN: OTIC | Status: DC

## 2012-07-30 NOTE — Progress Notes (Signed)
S:  This 57 y.o. Cauc female has ADHD, requiring medication for efficiency and productivity at work. She feels no adverse effects and maintains a positive and "upbeat" attitude in the workplace. She still has occasional difficulty staying focused because she has so many  Interests and a desire to pursue them all. She is progressing at home with organization. Nutrition is healthier as is sleep hygiene.  Pt  Has concerns about a chronic skin discoloration on bridge of her nose; her sister works in a [employer] office and she would like a referral for evaluation.  ROS: As per HPI.  O:  Filed Vitals:   07/27/12 1546  BP: 142/78  Pulse: 89  Temp: 98.8 F (37.1 C)  Resp: 16   GEN: In NAD; WN,WD. HENT: [place]/AT; EOMI w/ clear conj/ sclerae. EACs/ nose mormal. Oroph clear and moist. SKIN: Nasal bridge- faintly blue slightly raised lesion resembling a dilated vein on L. COR: RRR. LUNGS: Normal resp rate and effort. NEURO: A&O x 3; CNs intact. Nonfocal.  A/P: ADHD (attention deficit hyperactivity disorder)- stable on current medication dose; RFs x 4 months.  Issue of repeat prescriptions  Skin lesion of face - Plan: Ambulatory referral to Dermatology

## 2012-08-28 ENCOUNTER — Telehealth: Payer: Self-pay

## 2012-08-28 NOTE — Telephone Encounter (Signed)
Please advise on Dermatology appt

## 2012-08-28 NOTE — Telephone Encounter (Signed)
Patient states a referral to a dermatologist was being made. States that her sister,who is a Engineer, civil (consulting), told her the dermatology office would most likely call her to set up an appointment. I called the referrals desk and no one answered. Please call patient at [phone].

## 2012-11-28 ENCOUNTER — Encounter: Payer: Self-pay | Admitting: Family Medicine

## 2012-11-28 ENCOUNTER — Ambulatory Visit (INDEPENDENT_AMBULATORY_CARE_PROVIDER_SITE_OTHER): Payer: BC Managed Care – PPO | Admitting: Family Medicine

## 2012-11-28 VITALS — BP 140/92 | HR 93 | Temp 98.5°F | Resp 18 | Ht 66.0 in | Wt 172.0 lb

## 2012-11-28 DIAGNOSIS — F418 Other specified anxiety disorders: Secondary | ICD-10-CM

## 2012-11-28 DIAGNOSIS — F341 Dysthymic disorder: Secondary | ICD-10-CM

## 2012-11-28 DIAGNOSIS — Z760 Encounter for issue of repeat prescription: Secondary | ICD-10-CM

## 2012-11-28 DIAGNOSIS — F909 Attention-deficit hyperactivity disorder, unspecified type: Secondary | ICD-10-CM

## 2012-11-28 MED ORDER — VALACYCLOVIR HCL 500 MG PO TABS: ORAL_TABLET | ORAL | Status: DC

## 2012-11-28 MED ORDER — AMPHETAMINE-DEXTROAMPHETAMINE 10 MG PO TABS: 10.0000 mg | ORAL_TABLET | Freq: Two times a day (BID) | ORAL | Status: DC

## 2012-11-28 MED ORDER — ESCITALOPRAM OXALATE 20 MG PO TABS: ORAL_TABLET | ORAL | Status: DC

## 2012-11-28 NOTE — Progress Notes (Signed)
S:  This 57 y.o. Cauc female is here for medication refills for ADHD and depression w/ anxiety. Her family recently suffered a tragedy and pt reports other things going on that her made her feel "scattered". She does not want to change any medications at this time and hopes to be back to baseline within a few months. She is compliant w/ medications and does not report any adverse effects such as fatigue, loss of appetite, diaphoresis, CP or tightness, palpitations, SOB or DOE, GI problems, HA, dizziness, weakness, syncope, agitation, behavior problems or sleep disturbance.  Recent DERM evaluation resulted in referral to ENT specialist for nasal bridge lesion; pt had other skin lesions removed and is awaiting biopsy results. DERM suspects basal cell lesion as well as possible melanoma but path is pending.  Patient Active Problem List   Diagnosis Date Noted  . Depression with anxiety 04/26/2012  . ADHD (attention deficit hyperactivity disorder) 04/26/2012   PMHx, Soc Hx and Fam Hx reviewed. Medications reconciled.  ROS: As per HPI; otherwise noncontributory.  O:  Filed Vitals:   11/28/12 1627  BP: 140/92  Pulse: 93  Temp: 98.5 F (36.9 C)  Resp: 18   GEN: In NAD; WN,WD. HENT: [place]/AT; EOMI w/ clear conj/sclerae. Discolored lesion on nasal bridge. Otherwise unremarkable. COR: RRR. Trace ankle edema. LUNGS: Normal resp rate and effort. SKIN: W&D; no rashes or pallor. MS: MAEs; no joint deformities, cyanosis or clubbing. NEURO: A&O x 3; Cns intact. Nonfocal. PSYCH: Tearful but calm and attentive. Not labile or agitated. Speech pattern and thought content normal. Judgement sound.  A/P: ADHD (attention deficit hyperactivity disorder)- Refill medication x 4 months.  Depression with anxiety- Stable on current medications.  Issue of repeat prescriptions  Meds ordered this encounter  Medications  . escitalopram (LEXAPRO) 20 MG tablet    Sig: TAKE 1 TABLET (20 MG TOTAL) BY MOUTH DAILY.    Dispense:  30 tablet    Refill:  5  . valACYclovir (VALTREX) 500 MG tablet    Sig: Take 1 tablet daily or as directed.    Dispense:  30 tablet    Refill:  5  . DISCONTD: amphetamine-dextroamphetamine (ADDERALL) 10 MG tablet    Sig: Take 1 tablet (10 mg total) by mouth 2 (two) times daily.    Dispense:  60 tablet    Refill:  0  . DISCONTD: amphetamine-dextroamphetamine (ADDERALL) 10 MG tablet    Sig: Take 1 tablet (10 mg total) by mouth 2 (two) times daily.    Dispense:  60 tablet    Refill:  0    Do not fill before Sept 12, 2014.  Marland Kitchen DISCONTD: amphetamine-dextroamphetamine (ADDERALL) 10 MG tablet    Sig: Take 1 tablet (10 mg total) by mouth 2 (two) times daily.    Dispense:  60 tablet    Refill:  0    Do not fill before Jan 28, 2013.  Marland Kitchen amphetamine-dextroamphetamine (ADDERALL) 10 MG tablet    Sig: Take 1 tablet (10 mg total) by mouth 2 (two) times daily.    Dispense:  60 tablet    Refill:  0    Do not fill before Nov. 12, 2014.

## 2013-01-18 ENCOUNTER — Other Ambulatory Visit: Payer: Self-pay

## 2013-01-18 DIAGNOSIS — Z1231 Encounter for screening mammogram for malignant neoplasm of breast: Secondary | ICD-10-CM

## 2013-01-25 ENCOUNTER — Ambulatory Visit: Payer: BC Managed Care – PPO | Admitting: Family Medicine

## 2013-02-23 ENCOUNTER — Ambulatory Visit
Admission: RE | Admit: 2013-02-23 | Discharge: 2013-02-23 | Disposition: A | Discharge status: Home / Self Care | Payer: BC Managed Care – PPO | Source: Ambulatory Visit

## 2013-02-23 DIAGNOSIS — Z1231 Encounter for screening mammogram for malignant neoplasm of breast: Secondary | ICD-10-CM

## 2013-02-26 ENCOUNTER — Other Ambulatory Visit: Payer: Self-pay | Admitting: Family Medicine

## 2013-02-26 DIAGNOSIS — R928 Other abnormal and inconclusive findings on diagnostic imaging of breast: Secondary | ICD-10-CM

## 2013-03-12 ENCOUNTER — Other Ambulatory Visit: Payer: Self-pay

## 2013-03-12 ENCOUNTER — Other Ambulatory Visit: Payer: Self-pay | Admitting: Family Medicine

## 2013-03-12 DIAGNOSIS — R928 Other abnormal and inconclusive findings on diagnostic imaging of breast: Secondary | ICD-10-CM

## 2013-03-16 ENCOUNTER — Ambulatory Visit
Admission: RE | Admit: 2013-03-16 | Discharge: 2013-03-16 | Disposition: A | Discharge status: Home / Self Care | Payer: BC Managed Care – PPO | Source: Ambulatory Visit | Attending: Family Medicine | Admitting: Family Medicine

## 2013-03-16 DIAGNOSIS — R928 Other abnormal and inconclusive findings on diagnostic imaging of breast: Secondary | ICD-10-CM

## 2013-03-21 ENCOUNTER — Telehealth: Payer: Self-pay | Admitting: *Deleted

## 2013-03-21 NOTE — Telephone Encounter (Signed)
Faxed signed order to [facility] for diagnostic study, per Dr Audria Nine. Confirmation page received at 8:12 am.

## 2013-03-30 ENCOUNTER — Ambulatory Visit (INDEPENDENT_AMBULATORY_CARE_PROVIDER_SITE_OTHER): Payer: BC Managed Care – PPO | Admitting: Family Medicine

## 2013-03-30 ENCOUNTER — Encounter: Payer: Self-pay | Admitting: Family Medicine

## 2013-03-30 VITALS — BP 150/84 | HR 101 | Temp 98.2°F | Resp 16 | Ht 66.0 in | Wt 172.0 lb

## 2013-03-30 DIAGNOSIS — F418 Other specified anxiety disorders: Secondary | ICD-10-CM

## 2013-03-30 DIAGNOSIS — F909 Attention-deficit hyperactivity disorder, unspecified type: Secondary | ICD-10-CM

## 2013-03-30 DIAGNOSIS — K589 Irritable bowel syndrome without diarrhea: Secondary | ICD-10-CM

## 2013-03-30 DIAGNOSIS — F341 Dysthymic disorder: Secondary | ICD-10-CM

## 2013-03-30 MED ORDER — AMPHETAMINE-DEXTROAMPHETAMINE 10 MG PO TABS: 10.0000 mg | ORAL_TABLET | Freq: Two times a day (BID) | ORAL | Status: DC

## 2013-03-30 MED ORDER — ESCITALOPRAM OXALATE 20 MG PO TABS: ORAL_TABLET | ORAL | Status: DC

## 2013-03-30 MED ORDER — ESCITALOPRAM OXALATE 10 MG PO TABS: ORAL_TABLET | ORAL | Status: DC

## 2013-03-30 NOTE — Patient Instructions (Signed)
Irritable Bowel Syndrome °Irritable Bowel Syndrome (IBS) is caused by a disturbance of normal bowel function. Other terms used are spastic colon, mucous colitis, and irritable colon. It does not require surgery, nor does it lead to cancer. There is no cure for IBS. But with proper diet, stress reduction, and medication, you will find that your problems (symptoms) will gradually disappear or improve. IBS is a common digestive disorder. It usually appears in late adolescence or early adulthood. Women develop it twice as often as men. °CAUSES  °After food has been digested and absorbed in the small intestine, waste material is moved into the colon (large intestine). In the colon, water and salts are absorbed from the undigested products coming from the small intestine. The remaining residue, or fecal material, is held for elimination. Under normal circumstances, gentle, rhythmic contractions on the bowel walls push the fecal material along the colon towards the rectum. In IBS, however, these contractions are irregular and poorly coordinated. The fecal material is either retained too long, resulting in constipation, or expelled too soon, producing diarrhea. °SYMPTOMS  °The most common symptom of IBS is pain. It is typically in the lower left side of the belly (abdomen). But it may occur anywhere in the abdomen. It can be felt as heartburn, backache, or even as a dull pain in the arms or shoulders. The pain comes from excessive bowel-muscle spasms and from the buildup of gas and fecal material in the colon. This pain: °· Can range from sharp belly (abdominal) cramps to a dull, continuous ache. °· Usually worsens soon after eating. °· Is typically relieved by having a bowel movement or passing gas. °Abdominal pain is usually accompanied by constipation. But it may also produce diarrhea. The diarrhea typically occurs right after a meal or upon arising in the morning. The stools are typically soft and watery. They are often  flecked with secretions (mucus). °Other symptoms of IBS include: °· Bloating. °· Loss of appetite. °· Heartburn. °· Feeling sick to your stomach (nausea). °· Belching °· Vomiting °· Gas. °IBS may also cause a number of symptoms that are unrelated to the digestive system: °· Fatigue. °· Headaches. °· Anxiety °· Shortness of breath °· Difficulty in concentrating. °· Dizziness. °These symptoms tend to come and go. °DIAGNOSIS  °The symptoms of IBS closely mimic the symptoms of other, more serious digestive disorders. So your caregiver may wish to perform a variety of additional tests to exclude these disorders. He/she wants to be certain of learning what is wrong (diagnosis). The nature and purpose of each test will be explained to you. °TREATMENT °A number of medications are available to help correct bowel function and/or relieve bowel spasms and abdominal pain. Among the drugs available are: °· Mild, non-irritating laxatives for severe constipation and to help restore normal bowel habits. °· Specific anti-diarrheal medications to treat severe or prolonged diarrhea. °· Anti-spasmodic agents to relieve intestinal cramps. °· Your caregiver may also decide to treat you with a mild tranquilizer or sedative during unusually stressful periods in your life. °The important thing to remember is that if any drug is prescribed for you, make sure that you take it exactly as directed. Make sure that your caregiver knows how well it worked for you. °HOME CARE INSTRUCTIONS  °· Avoid foods that are high in fat or oils. Some examples are:heavy cream, butter, frankfurters, sausage, and other fatty meats. °· Avoid foods that have a laxative effect, such as fruit, fruit juice, and dairy products. °· Cut out   carbonated drinks, chewing gum, and "gassy" foods, such as beans and cabbage. This may help relieve bloating and belching. °· Bran taken with plenty of liquids may help relieve constipation. °· Keep track of what foods seem to trigger  your symptoms. °· Avoid emotionally charged situations or circumstances that produce anxiety. °· Start or continue exercising. °· Get plenty of rest and sleep. °MAKE SURE YOU:  °· Understand these instructions. °· Will watch your condition. °· Will get help right away if you are not doing well or get worse. °Document Released: 04/05/2005 Document Revised: 06/28/2011 Document Reviewed: 11/24/2007 °[employer]® Patient Information ©2014 [employer]. ° °

## 2013-04-03 ENCOUNTER — Other Ambulatory Visit: Payer: Self-pay

## 2013-04-03 DIAGNOSIS — K580 Irritable bowel syndrome with diarrhea: Secondary | ICD-10-CM | POA: Insufficient documentation

## 2013-04-03 MED ORDER — HYDROCHLOROTHIAZIDE 12.5 MG PO CAPS: ORAL_CAPSULE | ORAL | Status: DC

## 2013-04-03 NOTE — Progress Notes (Signed)
Subjective:    Patient ID: Catherine Cole, female    DOB: 1955-09-30, 57 y.o.   MRN: [id number]  HPI  This 57 y.o. Cauc female has Adult ADHD and is here for medication refills. Medication is good w/o adverse effects. Pt does not report diaphoresis, appetite loss, significant weight change, CP or tightness, palpitations, SOB or DOE, HA, dizziness, weakness or tremor.  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  Chronic depression and anxiety are stable on current medication; she and her family are still grieving the loss of her nephew to suicide earlier this year. The holidays are difficult but family are all supportive. Pt has minor sleep disturbance; she does not voice thoughts of self-harm, SI/HI. She thinks she would benefit from a dose increase of current medication.  Pt c/o GI upset w/ mild nausea and loose stools. She is afebrile, has no HA, dizziness, sore throat or sinus trouble, cough, vomiting or rashes. PMHx is remarkable for mild to moderate IBS. Pt cannot recall any particular food intolerances.   PMHx, Surg Hx, Soc and Fam Hx reviewed.  Medications reconciled.   Review of Systems  Constitutional: Positive for appetite change. Negative for fever, chills, diaphoresis and fatigue.  HENT: Negative.   Eyes: Negative.   Respiratory: Negative.   Cardiovascular: Negative.   Gastrointestinal: Positive for nausea, diarrhea and abdominal distention. Negative for vomiting, abdominal pain, constipation and blood in stool.  Endocrine: Negative.   Genitourinary: Negative.   Musculoskeletal: Negative.   Neurological: Negative.   Psychiatric/Behavioral: Positive for decreased concentration. Negative for suicidal ideas, confusion, self-injury and agitation. The patient is nervous/anxious. The patient is not hyperactive.       Objective:   Physical Exam  Nursing note and vitals reviewed. Constitutional: She is oriented to  person, place, and time. She appears well-developed and well-nourished. No distress.  HENT:  Head: Normocephalic and atraumatic.  Right Ear: External ear normal.  Left Ear: External ear normal.  Nose: Nose normal.  Mouth/Throat: Oropharynx is clear and moist. No oropharyngeal exudate.  Eyes: Conjunctivae and EOM are normal. Pupils are equal, round, and reactive to light. No scleral icterus.  Neck: Normal range of motion. Neck supple. No thyromegaly present.  Cardiovascular: Normal rate, regular rhythm and normal heart sounds.  Exam reveals no friction rub.   No murmur heard. Pulmonary/Chest: Effort normal and breath sounds normal. No respiratory distress.  Abdominal: Soft. Normal appearance. She exhibits no distension and no mass. Bowel sounds are increased. There is no hepatosplenomegaly. There is tenderness in the right lower quadrant, epigastric area and left lower quadrant. There is no rebound, no guarding and no CVA tenderness.  Musculoskeletal: Normal range of motion. She exhibits no tenderness.  Lymphadenopathy:       Head (right side): No submental, no submandibular, no tonsillar, no posterior auricular and no occipital adenopathy present.       Head (left side): No submental, no submandibular, no tonsillar, no posterior auricular and no occipital adenopathy present.    She has no cervical adenopathy.       Right cervical: No superficial cervical adenopathy present.      Left cervical: No superficial cervical adenopathy present.       Right: No supraclavicular adenopathy present.       Left: No supraclavicular adenopathy present.  Neurological: She is alert and oriented to person, place, and time. She has normal strength. She displays no atrophy and no tremor. No  cranial nerve deficit or sensory deficit. She exhibits normal muscle tone. Coordination and gait normal.  Skin: She is not diaphoretic.      Assessment & Plan:  Depression with anxiety- Stable given loss of family member;  continue current medication but increase Lexapro generic to 30 mg/daily (20 mg + 10 mg tablets).  ADHD (attention deficit hyperactivity disorder)- Stable, continue Adderall generic  IBS  (Irritable bowel syndrome)- Symptomatic treatment.   Meds ordered this encounter  Medications  . escitalopram (LEXAPRO) 20 MG tablet    Sig: Take a 20 mg tablet with a 10 mg tablet daily.    Dispense:  30 tablet    Refill:  5  . escitalopram (LEXAPRO) 10 MG tablet    Sig: Take a 10 mg tablet with a 20 mg tablet daily.    Dispense:  30 tablet    Refill:  5  . DISCONTD: amphetamine-dextroamphetamine (ADDERALL) 10 MG tablet    Sig: Take 1 tablet (10 mg total) by mouth 2 (two) times daily.    Dispense:  60 tablet    Refill:  0  . DISCONTD: amphetamine-dextroamphetamine (ADDERALL) 10 MG tablet    Sig: Take 1 tablet (10 mg total) by mouth 2 (two) times daily.    Dispense:  60 tablet    Refill:  0    Do not fill before an 10, 2015.  Marland Kitchen DISCONTD: amphetamine-dextroamphetamine (ADDERALL) 10 MG tablet    Sig: Take 1 tablet (10 mg total) by mouth 2 (two) times daily.    Dispense:  60 tablet    Refill:  0    Do not fill before Apr 28, 2013.  Marland Kitchen DISCONTD: amphetamine-dextroamphetamine (ADDERALL) 10 MG tablet    Sig: Take 1 tablet (10 mg total) by mouth 2 (two) times daily.    Dispense:  60 tablet    Refill:  0    Do not fill before May 28, 2013.  Marland Kitchen amphetamine-dextroamphetamine (ADDERALL) 10 MG tablet    Sig: Take 1 tablet (10 mg total) by mouth 2 (two) times daily.    Dispense:  60 tablet    Refill:  0    Do not fill before June 27, 2013.

## 2013-06-08 NOTE — Progress Notes (Signed)
PA approved for adderall 10 BID through 06/08/14. Notified pharm.

## 2013-06-26 ENCOUNTER — Telehealth: Payer: Self-pay

## 2013-06-26 ENCOUNTER — Ambulatory Visit (INDEPENDENT_AMBULATORY_CARE_PROVIDER_SITE_OTHER): Payer: BC Managed Care – PPO | Admitting: Emergency Medicine

## 2013-06-26 VITALS — BP 140/81 | HR 97 | Temp 99.5°F | Resp 18 | Wt 177.0 lb

## 2013-06-26 DIAGNOSIS — M549 Dorsalgia, unspecified: Secondary | ICD-10-CM

## 2013-06-26 DIAGNOSIS — L723 Sebaceous cyst: Secondary | ICD-10-CM

## 2013-06-26 NOTE — Telephone Encounter (Signed)
Pt states she had some "green thick" drainage from the cyst. Advised pt to RTC. Area seems to be irritated now. It is right under her bra strap. She states the area is "hollowed out" and might fill back up. Advised her of our walkin clinic hours. Pt states she will be in.

## 2013-06-26 NOTE — Progress Notes (Signed)
[facility] [address] [place] [address] [phone]  Date:  06/26/2013   Name:  Catherine Cole   DOB:  11/26/1955   MRN:  [id number]  PCP:  No primary provider on file.    Chief Complaint: Cyst   History of Present Illness:  Catherine Cole is a 58 y.o. very pleasant female patient who presents with the following:  Noticed a mass under her bra on the back.  Says it itches and is annoying and now painful due pressure when sitting.  No fever or chills.  Obtained moderate drainage on Sunday when she manipulated it.  History of previous lesion infected with MRSA.  No improvement with over the counter medications or other home remedies. Denies other complaint or health concern today.   Patient Active Problem List   Diagnosis Date Noted  . IBS (irritable bowel syndrome) 04/03/2013  . Depression with anxiety 04/26/2012  . ADHD (attention deficit hyperactivity disorder) 04/26/2012    Past Medical History  Diagnosis Date  . Depression   . Anxiety   . Anemia     Past Surgical History  Procedure Laterality Date  . Appendectomy    . Cholecystectomy    . Fracture surgery      History  Substance Use Topics  . Smoking status: Former Research scientist (life sciences)  . Smokeless tobacco: Not on file  . Alcohol Use: Not on file    Family History  Problem Relation Age of Onset  . Heart disease Mother   . Alcohol abuse Father   . Diabetes Father     Allergies  Allergen Reactions  . Codeine   . Demerol Nausea And Vomiting  . Erythromycin Itching and Rash  . Neosporin [Neomycin-Polymyxin B Gu] Itching and Rash    Medication list has been reviewed and updated.  Current Outpatient Prescriptions on File Prior to Visit  Medication Sig Dispense Refill  . amphetamine-dextroamphetamine (ADDERALL) 10 MG tablet Take 1 tablet (10 mg total) by mouth 2 (two) times daily.  60 tablet  0  . escitalopram (LEXAPRO) 10 MG tablet Take a 10 mg tablet with a 20 mg tablet daily.  30  tablet  5  . escitalopram (LEXAPRO) 20 MG tablet Take a 20 mg tablet with a 10 mg tablet daily.  30 tablet  5  . hydrochlorothiazide (MICROZIDE) 12.5 MG capsule TAKE ONE CAPSULE EVERY DAY  30 capsule  2  . Pramoxine-HC-Chloroxylenol Aq 10-10-1 MG/ML SOLN 4-5 drops in each ear twice a day for 7 days.  1 Bottle  0  . valACYclovir (VALTREX) 500 MG tablet Take 1 tablet daily or as directed.  30 tablet  5   No current facility-administered medications on file prior to visit.    Review of Systems:  As per HPI, otherwise negative.    Physical Examination: Filed Vitals:   06/26/13 1702  BP: 140/81  Pulse: 97  Temp: 99.5 F (37.5 C)  Resp: 18   Filed Vitals:   06/26/13 1702  Weight: 177 lb (80.287 kg)   Body mass index is 28.58 kg/(m^2). Ideal Body Weight:     GEN: WDWN, NAD, Non-toxic, Alert & Oriented x 3 HEENT: Atraumatic, Normocephalic.  Ears and Nose: No external deformity. EXTR: No clubbing/cyanosis/edema NEURO: Normal gait.  PSYCH: Normally interactive. Conversant. Not depressed or anxious appearing.  Calm demeanor.  BACK:  3 cm diameter sebaceous cyst mid back.  No cellulitis.  Not tender, some drainage.     Assessment and Plan:  Sebaceous cyst.  Signed,  Ellison Carwin, MD

## 2013-06-26 NOTE — Telephone Encounter (Signed)
PT STATES SHE HAVE A CYST ON HER BACK AND IT IS IRRITATED , WOULD LIKE TO SPEAK WITH DR [facility] OR HER ASST ABOUT IT [place] [phone]

## 2013-06-26 NOTE — Patient Instructions (Signed)
Clean daily with soap and water.  Keep drsg on the wound to prevent bleeding onto clothes.  Keep covered until healed.

## 2013-06-26 NOTE — Progress Notes (Signed)
Procedure:  Consent obtained.  Local anesthesia with 2% lido with epi.  Betadine prep.  3 mm punch biopsy around pore - purulence drainage and small amount of sebaceous material expressed.  Portion of the cyst wall is removed. Drsg placed.  Wound care d/w pt.

## 2013-07-27 ENCOUNTER — Ambulatory Visit (INDEPENDENT_AMBULATORY_CARE_PROVIDER_SITE_OTHER): Payer: BC Managed Care – PPO | Admitting: Family Medicine

## 2013-07-27 ENCOUNTER — Encounter: Payer: Self-pay | Admitting: Family Medicine

## 2013-07-27 VITALS — BP 142/84 | HR 90 | Resp 18 | Ht 65.5 in | Wt 177.0 lb

## 2013-07-27 DIAGNOSIS — F418 Other specified anxiety disorders: Secondary | ICD-10-CM

## 2013-07-27 DIAGNOSIS — F909 Attention-deficit hyperactivity disorder, unspecified type: Secondary | ICD-10-CM

## 2013-07-27 DIAGNOSIS — F341 Dysthymic disorder: Secondary | ICD-10-CM

## 2013-07-27 MED ORDER — VALACYCLOVIR HCL 500 MG PO TABS: ORAL_TABLET | ORAL | Status: DC

## 2013-07-27 MED ORDER — AMPHETAMINE-DEXTROAMPHETAMINE 10 MG PO TABS
10.0000 mg | ORAL_TABLET | Freq: Two times a day (BID) | ORAL | Status: DC
Start: 2013-07-27 — End: 2013-07-27

## 2013-07-27 MED ORDER — ESCITALOPRAM OXALATE 10 MG PO TABS: ORAL_TABLET | ORAL | Status: DC

## 2013-07-27 MED ORDER — AMPHETAMINE-DEXTROAMPHETAMINE 10 MG PO TABS: 10.0000 mg | ORAL_TABLET | Freq: Two times a day (BID) | ORAL | Status: DC

## 2013-07-31 NOTE — Progress Notes (Signed)
S: This 58 y.o. Cauc female is here for follow-up re: chronic depression w/ anxiety. This condition is well controlled on Citalopram 30 mg daily; pt requests medication be prescribed differently to save pharmacy costs. She continues to work full time and is productive with Adderall 10 mg taken twice daily. Pt has not adverse medication effects; she denies diaphoresis, fatigue, vision disturbances, CP or tightness, palpitations, SOB or DOE, GI problems, anorexia, agitation, confusion, lack of concentration, sleep disturbance or dysphoric mood.   Pt's sister and pt continue to grieve the death of pt's nephew (died of a drug OD). Pt' sister also experienced significant job related stress and harrassment; pt was major source of support through this difficult time. Sister now in a new employment situation and much happier. Pt and sister planning to move into an apartment which is located close to their mother. Pt is confident that this is a good solution at this time in their lives; they will be close to their mother who has severe DJD and has been postponing a much-needed joint replacement operation. With this move, the pt feels that she will be in a position to be the main support person for her mother and her sister.  Patient Active Problem List   Diagnosis Date Noted  . IBS (irritable bowel syndrome) 04/03/2013  . Depression with anxiety 04/26/2012  . ADHD (attention deficit hyperactivity disorder) 04/26/2012   PMHx, Surg Hx, Soc and Fam Hx reviewed.  MEDICATIONS reconciled.  ROS: As per HPI.  O: Filed Vitals:   07/27/13 1637  BP: 142/84  Pulse: 90  Resp: 18   GEN: In NAD; WN,WD. HENT: [place]/AT; EOMI w/ clear conj/sclerae. Wears corrective lenses. Otherwise unremarkable. NECK: Supple w/o LAN.  COR: RRR. No edema. LUNGS: Normal resp rate and effort. SKIN: W&D; intact w/o erythema, lesions or pallor. MS: MAEs; no deformities, c/c/e. NEURO: A&O x 3; CNs intact. Nonfocal. Speech somwhat pressured  and occasionally tangential thought content. Pt is pleasant and conversant, somewhat anxious but not inappropriate or labile. Cognition is sound w/o memory impairment and judgement is sound.  A/P: ADHD (attention deficit hyperactivity disorder)  Depression with anxiety- Stable on current medication dose; continue same.  Meds ordered this encounter  Medications  . escitalopram (LEXAPRO) 10 MG tablet    Sig: Take  3 10- mg tablets (=30 mg) once a day daily.    Dispense:  100 tablet    Refill:  5  . DISCONTD: amphetamine-dextroamphetamine (ADDERALL) 10 MG tablet    Sig: Take 1 tablet (10 mg total) by mouth 2 (two) times daily.    Dispense:  60 tablet    Refill:  0  . DISCONTD: amphetamine-dextroamphetamine (ADDERALL) 10 MG tablet    Sig: Take 1 tablet (10 mg total) by mouth 2 (two) times daily.    Dispense:  60 tablet    Refill:  0    May fill on or after Aug 26, 2013.  Marland Kitchen DISCONTD: amphetamine-dextroamphetamine (ADDERALL) 10 MG tablet    Sig: Take 1 tablet (10 mg total) by mouth 2 (two) times daily.    Dispense:  60 tablet    Refill:  0    May fill on or after September 26, 2013.  Marland Kitchen amphetamine-dextroamphetamine (ADDERALL) 10 MG tablet    Sig: Take 1 tablet (10 mg total) by mouth 2 (two) times daily.    Dispense:  60 tablet    Refill:  0    May fill on or after October 26, 2013.  Marland Kitchen  valACYclovir (VALTREX) 500 MG tablet    Sig: Take 1 tablet daily or as directed.    Dispense:  30 tablet    Refill:  5

## 2013-08-13 ENCOUNTER — Other Ambulatory Visit: Payer: Self-pay | Admitting: Family Medicine

## 2013-08-13 NOTE — Telephone Encounter (Signed)
Dr Leward Quan, you just saw pt for check-up but this med not discussed. Do you want to give RFs?

## 2013-08-13 NOTE — Telephone Encounter (Signed)
HCTZ 12.5 mg capsules refilled. Pt will have labs at CPE visit in Aug 2015.

## 2013-12-21 ENCOUNTER — Telehealth: Payer: Self-pay

## 2013-12-21 NOTE — Telephone Encounter (Signed)
Patient states she has misplaced her written prescription for "Adderral". Patient feels it is in her house somewhere but cant find it. Patient requesting another prescription to be written for her to pick up. Patient has 3 days left. Patients call back number [phone]

## 2013-12-21 NOTE — Telephone Encounter (Signed)
Patient called back to let our office know she just now found the original prescription. Please cancel request for new prescription for "Adderral"

## 2014-01-28 ENCOUNTER — Other Ambulatory Visit: Payer: Self-pay | Admitting: Family Medicine

## 2014-01-28 ENCOUNTER — Telehealth: Payer: Self-pay

## 2014-01-28 MED ORDER — AMPHETAMINE-DEXTROAMPHETAMINE 10 MG PO TABS: 10.0000 mg | ORAL_TABLET | Freq: Two times a day (BID) | ORAL | Status: DC

## 2014-01-28 NOTE — Telephone Encounter (Signed)
Adderall prescription at [facility] [address] for pick-up on or after Tuesday, Jan 29, 2014.

## 2014-01-28 NOTE — Telephone Encounter (Signed)
Pt would like a refill on her adderall, she does have an appt scheduled for 02/05/2014. Best# [phone]

## 2014-01-29 NOTE — Telephone Encounter (Signed)
Advised patient Rx ready for pick up .

## 2014-02-05 ENCOUNTER — Ambulatory Visit: Payer: BC Managed Care – PPO | Admitting: Family Medicine

## 2014-02-24 ENCOUNTER — Other Ambulatory Visit: Payer: Self-pay | Admitting: Family Medicine

## 2014-02-26 ENCOUNTER — Ambulatory Visit (INDEPENDENT_AMBULATORY_CARE_PROVIDER_SITE_OTHER): Payer: BC Managed Care – PPO | Admitting: Family Medicine

## 2014-02-26 ENCOUNTER — Encounter: Payer: Self-pay | Admitting: Family Medicine

## 2014-02-26 VITALS — BP 138/70 | HR 88 | Resp 16 | Ht 65.5 in | Wt 186.0 lb

## 2014-02-26 DIAGNOSIS — Z6831 Body mass index (BMI) 31.0-31.9, adult: Secondary | ICD-10-CM

## 2014-02-26 DIAGNOSIS — F902 Attention-deficit hyperactivity disorder, combined type: Secondary | ICD-10-CM

## 2014-02-26 MED ORDER — AMPHETAMINE-DEXTROAMPHETAMINE 10 MG PO TABS: 10.0000 mg | ORAL_TABLET | Freq: Two times a day (BID) | ORAL | Status: DC

## 2014-02-26 NOTE — Progress Notes (Signed)
S: This 58 y.o. Cauc female has Adult ADHD, effectively treated w/ Adderall 10 mg. She had been taking 1 tablet daily and wondering why she lost focus in the afternoon. She then looked at the label and realized that the dose is bid. She had been taking 2 tablets every morning but changed dosing for some reason. Since last visit, her mother had major hip surgery and has recovered w/o complications.  This past weekend, pt attended wedding of the daughter of her boss; it was quite an elaborate affair, bassd in the [place] tradition. Pt states she is exhausted from the event.   Patient Active Problem List   Diagnosis Date Noted  . IBS (irritable bowel syndrome) 04/03/2013  . Depression with anxiety 04/26/2012  . ADHD (attention deficit hyperactivity disorder) 04/26/2012    Prior to Admission medications   Medication Sig Start Date End Date Taking? Authorizing Provider  amphetamine-dextroamphetamine (ADDERALL) 10 MG tablet Take 1 tablet (10 mg total) by mouth 2 (two) times daily. 02/26/14  Yes Barton Fanny, MD  escitalopram (LEXAPRO) 10 MG tablet TAKE 3 TABLETS (30MG ) ONCE A DAY 02/24/14  Yes Mancel Bale, PA-C  Pramoxine-HC-Chloroxylenol Aq 10-10-1 MG/ML SOLN 4-5 drops in each ear twice a day for 7 days. 07/27/12  Yes Barton Fanny, MD  valACYclovir (VALTREX) 500 MG tablet Take 1 tablet daily or as directed. 07/27/13  Yes Barton Fanny, MD   ROS: Noncontributory. Negative for diaphoresis, anorexia, CP or palpitations, SOB, GI problems, HA, dizziness, tremors, weakness or syncope. She has no sleep problems.  O: Filed Vitals:   02/26/14 1432  BP: 138/70  Pulse: 88  Resp: 16   GEN: In NAD; WN/WD. HENT: [place]/AT; EOMI w/ clear conj/sclerae. Otherwise unremarkable. COR: RRR. LUNGS: Unlabored resp. SKIN; W&D. NEURO: A&O x 3; CNs intact. Nonfocal.  A/P: Attention deficit hyperactivity disorder (ADHD), combined type- Continue Adderall 10 mg 1 tab bid. RTC as sch for  CPE/labs.  Body mass index (BMI) of 31.0-31.9 in adult

## 2014-03-26 ENCOUNTER — Ambulatory Visit (INDEPENDENT_AMBULATORY_CARE_PROVIDER_SITE_OTHER): Payer: BC Managed Care – PPO | Admitting: Family Medicine

## 2014-03-26 ENCOUNTER — Ambulatory Visit (INDEPENDENT_AMBULATORY_CARE_PROVIDER_SITE_OTHER): Payer: BC Managed Care – PPO

## 2014-03-26 ENCOUNTER — Encounter: Payer: Self-pay | Admitting: Family Medicine

## 2014-03-26 VITALS — BP 158/84 | HR 85 | Temp 98.0°F | Resp 16 | Ht 66.0 in | Wt 183.0 lb

## 2014-03-26 DIAGNOSIS — I10 Essential (primary) hypertension: Secondary | ICD-10-CM

## 2014-03-26 DIAGNOSIS — Z1329 Encounter for screening for other suspected endocrine disorder: Secondary | ICD-10-CM

## 2014-03-26 DIAGNOSIS — Z13228 Encounter for screening for other metabolic disorders: Secondary | ICD-10-CM

## 2014-03-26 DIAGNOSIS — Z13820 Encounter for screening for osteoporosis: Secondary | ICD-10-CM

## 2014-03-26 DIAGNOSIS — Z1159 Encounter for screening for other viral diseases: Secondary | ICD-10-CM

## 2014-03-26 DIAGNOSIS — Z0000 Encounter for general adult medical examination without abnormal findings: Secondary | ICD-10-CM

## 2014-03-26 DIAGNOSIS — Z136 Encounter for screening for cardiovascular disorders: Secondary | ICD-10-CM

## 2014-03-26 DIAGNOSIS — Z23 Encounter for immunization: Secondary | ICD-10-CM

## 2014-03-26 DIAGNOSIS — Z131 Encounter for screening for diabetes mellitus: Secondary | ICD-10-CM

## 2014-03-26 DIAGNOSIS — Z130 Encounter for screening for diseases of the blood and blood-forming organs and certain disorders involving the immune mechanism: Secondary | ICD-10-CM

## 2014-03-26 DIAGNOSIS — E782 Mixed hyperlipidemia: Secondary | ICD-10-CM

## 2014-03-26 LAB — POCT UA - MICROSCOPIC ONLY
CRYSTALS, UR, HPF, POC: NEGATIVE
Casts, Ur, LPF, POC: NEGATIVE
MUCUS UA: NEGATIVE
WBC, Ur, HPF, POC: NEGATIVE
Yeast, UA: NEGATIVE

## 2014-03-26 LAB — POCT URINALYSIS DIPSTICK
Bilirubin, UA: NEGATIVE
GLUCOSE UA: NEGATIVE
Ketones, UA: NEGATIVE
Nitrite, UA: NEGATIVE
PROTEIN UA: NEGATIVE
RBC UA: NEGATIVE
SPEC GRAV UA: 1.015
Urobilinogen, UA: 0.2
pH, UA: 5

## 2014-03-26 LAB — COMPLETE METABOLIC PANEL WITH GFR
ALT: 16 U/L (ref 0–35)
AST: 16 U/L (ref 0–37)
Albumin: 4.2 g/dL (ref 3.5–5.2)
Alkaline Phosphatase: 76 U/L (ref 39–117)
BUN: 17 mg/dL (ref 6–23)
CALCIUM: 9.6 mg/dL (ref 8.4–10.5)
CO2: 25 mEq/L (ref 19–32)
Chloride: 103 mEq/L (ref 96–112)
Creat: 0.79 mg/dL (ref 0.50–1.10)
GFR, Est Non African American: 83 mL/min
Glucose, Bld: 93 mg/dL (ref 70–99)
POTASSIUM: 4.4 meq/L (ref 3.5–5.3)
SODIUM: 140 meq/L (ref 135–145)
TOTAL PROTEIN: 7.2 g/dL (ref 6.0–8.3)
Total Bilirubin: 0.4 mg/dL (ref 0.2–1.2)

## 2014-03-26 LAB — LIPID PANEL
Cholesterol: 200 mg/dL (ref 0–200)
HDL: 50 mg/dL (ref >39)
LDL CALC: 135 mg/dL — AB (ref 0–99)
Total CHOL/HDL Ratio: 4 Ratio
Triglycerides: 76 mg/dL (ref <150)
VLDL: 15 mg/dL (ref 0–40)

## 2014-03-26 LAB — POCT GLYCOSYLATED HEMOGLOBIN (HGB A1C): Hemoglobin A1C: 5.5

## 2014-03-26 LAB — CBC
HCT: 38 % (ref 36.0–46.0)
HEMOGLOBIN: 13.1 g/dL (ref 12.0–15.0)
MCH: 28.9 pg (ref 26.0–34.0)
MCHC: 34.5 g/dL (ref 30.0–36.0)
MCV: 83.9 fL (ref 78.0–100.0)
MPV: 9.6 fL (ref 9.4–12.4)
Platelets: 277 10*3/uL (ref 150–400)
RBC: 4.53 MIL/uL (ref 3.87–5.11)
RDW: 13.6 % (ref 11.5–15.5)
WBC: 6.7 10*3/uL (ref 4.0–10.5)

## 2014-03-26 LAB — THYROID PANEL WITH TSH
FREE THYROXINE INDEX: 1.8 (ref 1.4–3.8)
T3 Uptake: 26 % (ref 22–35)
T4, Total: 7.1 ug/dL (ref 4.5–12.0)
TSH: 1.535 u[IU]/mL (ref 0.350–4.500)

## 2014-03-26 MED ORDER — LISINOPRIL-HYDROCHLOROTHIAZIDE 10-12.5 MG PO TABS: 1.0000 | ORAL_TABLET | Freq: Every day | ORAL | Status: DC

## 2014-03-26 NOTE — Patient Instructions (Addendum)
You are being treated for hypertension.  You will take the lisinopril-hydrochlorothiazide 1 tablet daily.  Please return if you have any new dizziness, or shortness of breath, or chest pains.   Hypertension Hypertension, commonly called high blood pressure, is when the force of blood pumping through your arteries is too strong. Your arteries are the blood vessels that carry blood from your heart throughout your body. A blood pressure reading consists of a higher number over a lower number, such as 110/72. The higher number (systolic) is the pressure inside your arteries when your heart pumps. The lower number (diastolic) is the pressure inside your arteries when your heart relaxes. Ideally you want your blood pressure below 120/80. Hypertension forces your heart to work harder to pump blood. Your arteries may become narrow or stiff. Having hypertension puts you at risk for heart disease, stroke, and other problems.  RISK FACTORS Some risk factors for high blood pressure are controllable. Others are not.  Risk factors you cannot control include:  1. Race. You may be at higher risk if you are African American. 2. Age. Risk increases with age. 3. Gender. Men are at higher risk than women before age 96 years. After age 87, women are at higher risk than men. Risk factors you can control include: 1. Not getting enough exercise or physical activity. 2. Being overweight. 3. Getting too much fat, sugar, calories, or salt in your diet. 4. Drinking too much alcohol. SIGNS AND SYMPTOMS Hypertension does not usually cause signs or symptoms. Extremely high blood pressure (hypertensive crisis) may cause headache, anxiety, shortness of breath, and nosebleed. DIAGNOSIS  To check if you have hypertension, your health care provider will measure your blood pressure while you are seated, with your arm held at the level of your heart. It should be measured at least twice using the same arm. Certain conditions can cause  a difference in blood pressure between your right and left arms. A blood pressure reading that is higher than normal on one occasion does not mean that you need treatment. If one blood pressure reading is high, ask your health care provider about having it checked again. TREATMENT  Treating high blood pressure includes making lifestyle changes and possibly taking medicine. Living a healthy lifestyle can help lower high blood pressure. You may need to change some of your habits. Lifestyle changes may include: 1. Following the DASH diet. This diet is high in fruits, vegetables, and whole grains. It is low in salt, red meat, and added sugars. 2. Getting at least 2 hours of brisk physical activity every week. 3. Losing weight if necessary. 4. Not smoking. 5. Limiting alcoholic beverages. 6. Learning ways to reduce stress. If lifestyle changes are not enough to get your blood pressure under control, your health care provider may prescribe medicine. You may need to take more than one. Work closely with your health care provider to understand the risks and benefits. HOME CARE INSTRUCTIONS 1. Have your blood pressure rechecked as directed by your health care provider.  2. Take medicines only as directed by your health care provider. Follow the directions carefully. Blood pressure medicines must be taken as prescribed. The medicine does not work as well when you skip doses. Skipping doses also puts you at risk for problems.  3. Do not smoke.  4. Monitor your blood pressure at home as directed by your health care provider. SEEK MEDICAL CARE IF:   You think you are having a reaction to medicines taken.  You  have recurrent headaches or feel dizzy.  You have swelling in your ankles.  You have trouble with your vision. SEEK IMMEDIATE MEDICAL CARE IF:  You develop a severe headache or confusion.  You have unusual weakness, numbness, or feel faint.  You have severe chest or abdominal  pain.  You vomit repeatedly.  You have trouble breathing. MAKE SURE YOU:   Understand these instructions.  Will watch your condition.  Will get help right away if you are not doing well or get worse. Document Released: 04/05/2005 Document Revised: 08/20/2013 Document Reviewed: 01/26/2013 [facility] Patient Information 2015 [place], [place]. This information is not intended to replace advice given to you by your health care provider. Make sure you discuss any questions you have with your health care provider.     Keeping You Healthy  Get These Tests  Blood Pressure- Have your blood pressure checked by your healthcare provider at least once a year.  Normal blood pressure is 120/80.  Weight- Have your body mass index (BMI) calculated to screen for obesity.  BMI is a measure of body fat based on height and weight.  You can calculate your own BMI at [employer].it  Cholesterol- Have your cholesterol checked every year.  Diabetes- Have your blood sugar checked every year if you have high blood pressure, high cholesterol, a family history of diabetes or if you are overweight.  Pap Smear- Have a pap smear every 1 to 3 years if you have been sexually active.  If you are older than 65 and recent pap smears have been normal you may not need additional pap smears.  In addition, if you have had a hysterectomy  For benign disease additional pap smears are not necessary.  Mammogram-Yearly mammograms are essential for early detection of breast cancer  Screening for Colon Cancer- Colonoscopy starting at age 45. Screening may begin sooner depending on your family history and other health conditions.  Follow up colonoscopy as directed by your Gastroenterologist.  Screening for Osteoporosis- Screening begins at age 83 with bone density scanning, sooner if you are at higher risk for developing Osteoporosis.  Get these medicines  Calcium with Vitamin D- Your body requires 1200-1500 mg of  Calcium a day and [phone] IU of Vitamin D a day.  You can only absorb 500 mg of Calcium at a time therefore Calcium must be taken in 2 or 3 separate doses throughout the day.  Hormones- Hormone therapy has been associated with increased risk for certain cancers and heart disease.  Talk to your healthcare provider about if you need relief from menopausal symptoms.  Aspirin- Ask your healthcare provider about taking Aspirin to prevent Heart Disease and Stroke.  Get these Immuniztions  Flu shot- Every fall  Pneumonia shot- Once after the age of 52; if you are younger ask your healthcare provider if you need a pneumonia shot.  Tetanus- Every ten years.  Zostavax- Once after the age of 70 to prevent shingles.  Take these steps  Don't smoke- Your healthcare provider can help you quit. For tips on how to quit, ask your healthcare provider or go to www.smokefree.gov or call 1-800 QUIT-NOW.  Be physically active- Exercise 5 days a week for a minimum of 30 minutes.  If you are not already physically active, start slow and gradually work up to 30 minutes of moderate physical activity.  Try walking, dancing, bike riding, swimming, etc.  Eat a healthy diet- Eat a variety of healthy foods such as fruits, vegetables, whole grains, low  fat milk, low fat cheeses, yogurt, lean meats, chicken, fish, eggs, dried beans, tofu, etc.  For more information go to www.thenutritionsource.org  Dental visit- Brush and floss teeth twice daily; visit your dentist twice a year.  Eye exam- Visit your Optometrist or Ophthalmologist yearly.  Drink alcohol in moderation- Limit alcohol intake to one drink or less a day.  Never drink and drive.  Depression- Your emotional health is as important as your physical health.  If you're feeling down or losing interest in things you normally enjoy, please talk to your healthcare provider.  Seat Belts- can save your life; always wear one  Smoke/Carbon Monoxide detectors- These  detectors need to be installed on the appropriate level of your home.  Replace batteries at least once a year.  Violence- If anyone is threatening or hurting you, please tell your healthcare provider.  Living Will/ Health care power of attorney- Discuss with your healthcare provider and family.

## 2014-03-26 NOTE — Progress Notes (Addendum)
Subjective:    Patient ID: Catherine Cole, female    DOB: Apr 19, 1956, 58 y.o.   MRN: [id number]  HPI This 58 y.o. Cauc female is here for CPE. She has ADHD for which she takes adderall 10 mg; she was taking 2 tablets every morning w/ good results but changed dosing to 10 mg twice a day and finds herself unable to stay on task and "feels scattered". No adverse effects are reported.  Pt reports an episode of pain in her back after loading supplies into her car after craft expo this past weekend. She was busy the entire weekend and nutrition suffered; she tried to maintain hydration but over-exerted herself. Once she got in the car, she felt relief that the busy weekend was done but then felt a twinge under her ribs and in her mid-back. Pt reports feeling mildly but briefly diaphoretic but no nausea, CP or tightness, palpitations, SOB, cough, n/v, HA, dizziness, lightheadedness or weakness. Current mattress > 57 years old. Recent DEXA shows osteoporosis/ low bone mass per pt.   HCM:  PAP- GYN (Dr. Raphael Gibney).            MMG- Pt to schedule.            CRS-  Current (2012 w/ recall @ 10 years).            IMM- Current.            Vision- Current; wears corrective lenses.  Patient Active Problem List   Diagnosis Date Noted  . Mixed dyslipidemia 03/26/2014  . IBS (irritable bowel syndrome) 04/03/2013  . Depression with anxiety 04/26/2012  . ADHD (attention deficit hyperactivity disorder) 04/26/2012    Prior to Admission medications   Medication Sig Start Date End Date Taking? Authorizing Provider  amphetamine-dextroamphetamine (ADDERALL) 10 MG tablet Take 1 tablet (10 mg total) by mouth 2 (two) times daily. 02/26/14  Yes Barton Fanny, MD  calcium-vitamin D 250-100 MG-UNIT per tablet Take 1 tablet by mouth 2 (two) times daily.   Yes Historical Provider, MD  escitalopram (LEXAPRO) 10 MG tablet TAKE 3 TABLETS (30MG ) ONCE A DAY 02/24/14  Yes Mancel Bale, PA-C  valACYclovir (VALTREX) 500  MG tablet Take 1 tablet daily or as directed. 07/27/13  Yes Barton Fanny, MD    History   Social History  . Marital Status: Widowed    Spouse Name: N/A    Number of Children: N/A  . Years of Education: N/A   Occupational History  . Not on file.   Social History Main Topics  . Smoking status: Former Research scientist (life sciences)  . Smokeless tobacco: Not on file  . Alcohol Use: No  . Drug Use: No  . Sexual Activity: No   Other Topics Concern  . Not on file   Social History Narrative    Family History  Problem Relation Age of Onset  . Heart disease Mother 74    Heart attack  . Alcohol abuse Father   . Diabetes Father                 Review of Systems  Constitutional: Negative.   HENT: Negative.   Eyes: Negative.   Respiratory: Negative.   Cardiovascular: Negative.   Gastrointestinal: Positive for abdominal pain.       Chronic IBS.  Endocrine: Negative.   Genitourinary: Negative.        Exam per GYN.  Musculoskeletal: Positive for back pain and arthralgias. Negative for myalgias, joint swelling  and gait problem.       Minor joint discomfort in hands.  Skin: Negative.   Allergic/Immunologic: Negative.   Psychiatric/Behavioral: Positive for decreased concentration. Negative for suicidal ideas, behavioral problems, confusion, sleep disturbance, self-injury and dysphoric mood. The patient is nervous/anxious.       Objective:   Physical Exam  Constitutional: She is oriented to person, place, and time. She appears well-developed and well-nourished. No distress.  HENT:  Head: Normocephalic and atraumatic.  Right Ear: Hearing and external ear normal.  Left Ear: Hearing and external ear normal.  Nose: Nose normal. No nasal deformity or septal deviation.  Mouth/Throat: Oropharynx is clear and moist and mucous membranes are normal. Normal dentition.  Eyes: Conjunctivae, EOM and lids are normal. Pupils are equal, round, and reactive to light. No scleral icterus.  Neck: Trachea normal,  normal range of motion, full passive range of motion without pain and phonation normal. Neck supple. No spinous process tenderness and no muscular tenderness present. No thyroid mass and no thyromegaly present.  Cardiovascular: Normal rate, S1 normal, S2 normal, intact distal pulses and normal pulses.   No extrasystoles are present. PMI is not displaced.  Exam reveals no gallop and no friction rub.   Murmur heard.  Crescendo systolic murmur is present with a grade of 2/6  SOft blowing murmur heard at RUSB; nonradiating.  Pulmonary/Chest: Effort normal and breath sounds normal. No respiratory distress. She has no decreased breath sounds. She has no wheezes. She has no rales.  Abdominal: Soft. Normal appearance and bowel sounds are normal. She exhibits no distension, no abdominal bruit, no pulsatile midline mass and no mass. There is no hepatosplenomegaly. There is generalized tenderness. There is no rigidity, no rebound, no guarding and no CVA tenderness.  Genitourinary:  Deferred.  Musculoskeletal:       Cervical back: Normal.       Thoracic back: She exhibits tenderness and spasm. She exhibits no bony tenderness, no deformity and no pain.       Lumbar back: Normal.  Remainder of exam unremarkable.  Lymphadenopathy:       Head (right side): No submental, no submandibular, no tonsillar, no preauricular, no posterior auricular and no occipital adenopathy present.       Head (left side): No submental, no submandibular, no tonsillar, no preauricular, no posterior auricular and no occipital adenopathy present.    She has no cervical adenopathy.       Right: No supraclavicular adenopathy present.       Left: No supraclavicular adenopathy present.  Neurological: She is alert and oriented to person, place, and time. She has normal strength. She displays no atrophy and no tremor. No cranial nerve deficit or sensory deficit. She exhibits normal muscle tone. Coordination and gait normal.  Skin: Skin is  warm, dry and intact. No ecchymosis and no rash noted. She is not diaphoretic. No cyanosis or erythema. No pallor. Nails show no clubbing.  Thinning scalp hair.  Psychiatric: She has a normal mood and affect. Her behavior is normal. Judgment and thought content normal. Her speech is tangential. Her speech is not rapid and/or pressured. Cognition and memory are normal. She is communicative.  Nursing note and vitals reviewed.   Results for orders placed or performed in visit on 03/26/14  POCT UA - Microscopic Only  Result Value Ref Range   WBC, Ur, HPF, POC Negative    RBC, urine, microscopic 1-4    Bacteria, U Microscopic 1+    Mucus, UA Negative  Epithelial cells, urine per micros 1-2    Crystals, Ur, HPF, POC Negative    Casts, Ur, LPF, POC Negative    Yeast, UA Negative   POCT urinalysis dipstick  Result Value Ref Range   Color, UA Yellow    Clarity, UA Slightly Cloudy    Glucose, UA Negative    Bilirubin, UA Negative    Ketones, UA Negative    Spec Grav, UA 1.015    Blood, UA Negative    pH, UA 5.0    Protein, UA Negative    Urobilinogen, UA 0.2    Nitrite, UA Negative    Leukocytes, UA Trace   POCT glycosylated hemoglobin (Hb A1C)  Result Value Ref Range   Hemoglobin A1C 5.5    ECG: NSR; poor R wave progression.  No acute ST-TW changes.    Assessment & Plan:  Annual physical exam - Plan: DG Chest 2 View  Laboratory examination ordered as part of a routine general medical examination - Plan: POCT urinalysis dipstick  Screening for deficiency anemia - Plan: CBC  Essential hypertension - Elevated w/ mild symptoms. Start anti-hypertensive. Plan: DG Chest 2 View, Ambulatory referral to Cardiology, lisinopril-hydrochlorothiazide (PRINZIDE,ZESTORETIC) 10-12.5 MG per tablet  Screening for metabolic disorder - Plan: COMPLETE METABOLIC PANEL WITH GFR  Screening for thyroid disorder - Plan: Thyroid Panel With TSH  Screening for diabetes mellitus (DM) - Normal A1c;  encourage improved lifestyle. Plan: POCT UA - Microscopic Only, POCT glycosylated hemoglobin (Hb A1C)  Screening for cardiovascular condition - Plan: EKG 12-Lead  Need for Tdap vaccination - Plan: Tdap vaccine greater than or equal to 7yo IM  Need for hepatitis C screening test - Plan: Hepatitis C antibody  Screening for osteoporosis - Plan: Vit D  25 hydroxy (rtn osteoporosis monitoring)  Mixed dyslipidemia - Plan: Lipid panel  Meds ordered this encounter  Medications  . lisinopril-hydrochlorothiazide (PRINZIDE,ZESTORETIC) 10-12.5 MG per tablet    Sig: Take 1 tablet by mouth daily.    Dispense:  30 tablet    Refill:  2    Barton Fanny, MD [facility]

## 2014-03-26 NOTE — Progress Notes (Signed)
MRN: [id number] DOB: [birth year]  Subjective:   Catherine Cole is a 58 y.o. female presenting for a complete physical exam.  She complains of pain and decreased strength, specifically with opening jars.  She has tenderness with her left hand, and along the dorsal hand.  She had crushed her hand about 3 years ago in a skating accident.  She notes no masses or current bony deformities. She also complains of foot pain when she is working.  She wears flats, and sandals that had little soles.  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    She also complains that her adderrall may not be working.  She takes 10mg  BID, and notes that she feels scattered, as if she can not get anything done.  The adderrall worked better when she was taking the 20mg  in the morning.  She denies abnormal appetite, n/v, dizziness, or chest pains.  She also complains of an anxiety attack yesterday, after loading her car with boxes.  Moments after, she had an intense back pain.  This was sharp and caused sob, though she denies pain with inspiration.  She denies diaphoresis, nausea/vomiting with this episode.  She does endorse episodes of indigestion.  She claims that over the 1.5 months, she has increased agitation.     Exercise: None, she states that she walks about 272ft every day.  Diet consist of some grilled and steamed vegetables.  She eats a lot of gluten free chips, and salty foods.     Works at [employer] and [employer] and [employer].    She is currently not sexually active.  Last eye exam was this year.  She did a flu vaccine in late October at her job.    No smoking, no alcohol.  Meds LAMIRACLE CHAIDEZ has a current medication list which includes the following prescription(s): amphetamine-dextroamphetamine, calcium-vitamin d, escitalopram, and valacyclovir.  Allergies DAYJAH SELMAN is allergic to codeine; demerol; erythromycin; and neosporin.  PMH GORDIE BELVIN   has a past medical history of Depression; Anxiety; and Anemia.   SH Also has past surgical history that includes Appendectomy; Cholecystectomy; and Fracture surgery. She does not consume etoh, or alcohol.  FH  Problem Relation Age of Onset  . Heart disease Mother 23    Heart attack  . Alcohol abuse Father   . Diabetes Father     ROS As in subjective.  Objective:   Vitals: BP 158/84 mmHg  Pulse 85  Temp(Src) 98 F (36.7 C)  Resp 16  Ht 5\' 6"  (1.676 m)  Wt 183 lb (83.008 kg)  BMI 29.55 kg/m2  SpO2 96%  Physical Exam  Constitutional: She is oriented to person, place, and time and well-developed, well-nourished, and in no distress.  HENT:  Head: Normocephalic and atraumatic.  Eyes: Conjunctivae are normal. Pupils are equal, round, and reactive to light.  Neck: Normal range of motion. Neck supple. No thyromegaly present.  Cardiovascular: Normal rate, regular rhythm, normal heart sounds and intact distal pulses.  Exam reveals no gallop and no friction rub.   Pulmonary/Chest: Effort normal and breath sounds normal. No respiratory distress. She has no wheezes.  Abdominal: Soft. Bowel sounds are normal. There is no tenderness.  Lymphadenopathy:    She has no cervical adenopathy.  Neurological: She is alert and oriented to person, place, and time. No cranial nerve deficit. Gait normal. Coordination normal.  Skin: Skin is warm and dry. No  erythema.  Psychiatric: Mood, memory, affect and judgment normal.  Tangential     Wt Readings from Last 3 Encounters:  03/26/14 183 lb (83.008 kg)  02/26/14 186 lb (84.369 kg)  07/27/13 177 lb (80.287 kg)   Results for orders placed or performed in visit on 03/26/14  POCT UA - Microscopic Only  Result Value Ref Range   WBC, Ur, HPF, POC Negative    RBC, urine, microscopic 1-4    Bacteria, U Microscopic 1+    Mucus, UA Negative    Epithelial cells, urine per micros 1-2    Crystals, Ur, HPF, POC Negative    Casts, Ur, LPF, POC  Negative    Yeast, UA Negative   POCT urinalysis dipstick  Result Value Ref Range   Color, UA Yellow    Clarity, UA Slightly Cloudy    Glucose, UA Negative    Bilirubin, UA Negative    Ketones, UA Negative    Spec Grav, UA 1.015    Blood, UA Negative    pH, UA 5.0    Protein, UA Negative    Urobilinogen, UA 0.2    Nitrite, UA Negative    Leukocytes, UA Trace   POCT glycosylated hemoglobin (Hb A1C)  Result Value Ref Range   Hemoglobin A1C 5.5    EKG reviewed by Dr. Leward Quan: Normal sinus rhythm, no acute ST changes, Poor R wave progression. [facility] reading (PRIMARY) by  Dr. Leward Quan: No acute cardiac/pulmonary findings. Cardiac silhouette is clear.  Vertebral appears osteopenic.   Assessment and Plan :  58 year old female with PMH of IBS, ADHD, and depression is here for a CPE  Annual Physical Exam  Essential hypertension While chest xray and ekgs did not demonstrate any acute changes, elevated BPs in this older overweight female with familial cardiac disease warrants further work up.  Cardiology consult would be appreciated at this time.  DG Chest 2 View, Ambulatory referral to Cardiology, lisinopril-hydrochlorothiazide (PRINZIDE,ZESTORETIC) 10-12.5 MG per tablet  EKG 12-Lead Other back pain  Screening for cardiovascular condition Chest 2 View, Ambulatory referral to Cardiology  Need for Tdap vaccination  Tdap vaccine greater than or equal to 7yo IM  Laboratory examination ordered as part of a routine general medical examination POCT urinalysis dipstick  Screening for deficiency anemia  CBC  Essential hypertension Ambulatory referral to Cardiology  Screening for metabolic disorder  Plan: COMPLETE METABOLIC PANEL WITH GFR  Screening for thyroid disorder  Thyroid Panel With TSH  Mixed dyslipidemia Screening for lipid disorders Lipid panel  Screening for diabetes mellitus (DM)  POCT UA - Microscopic Only, POCT glycosylated hemoglobin (Hb A1C)  Need for  hepatitis C screening test  Hepatitis C antibody  Screening for osteoporosis Vit D  25 hydroxy (rtn osteoporosis monitoring)    Ivar Drape, PA-C [facility] 12/8/20153:55 PM

## 2014-03-27 LAB — VITAMIN D 25 HYDROXY (VIT D DEFICIENCY, FRACTURES): Vit D, 25-Hydroxy: 20 ng/mL — ABNORMAL LOW (ref 30–100)

## 2014-03-27 LAB — HEPATITIS C ANTIBODY: HCV AB: NEGATIVE

## 2014-03-27 MED ORDER — VITAMIN D (ERGOCALCIFEROL) 1.25 MG (50000 UNIT) PO CAPS: ORAL_CAPSULE | ORAL | Status: DC

## 2014-03-27 NOTE — Addendum Note (Signed)
Addended by: Ellsworth Lennox B on: 03/27/2014 03:18 PM   Modules accepted: Orders

## 2014-03-27 NOTE — Progress Notes (Signed)
Quick Note:  Please advise pt regarding following labs... Recent lab result are as follows; LDL ("bad") cholesterol is above normal but remainder of lipid panel is normal. Work on nutritional modifications and regular physical activity. Hepatitis C antibody test is negative. Thyroid tests are normal.  Blood counts are normal. Metabolic labs are normal (sodium, potassium, chloride, calcium, blood sugar, kidney and liver functions). Vitamin D level is low; I am prescribing Vitamin D 50000 units to be taken once a week for several months.  Contact the clinic if you have questions or concerns.  Copy to pt. ______

## 2014-03-28 ENCOUNTER — Telehealth: Payer: Self-pay | Admitting: Radiology

## 2014-03-28 ENCOUNTER — Encounter: Payer: Self-pay | Admitting: Radiology

## 2014-03-28 NOTE — Telephone Encounter (Signed)
-----   Message from Barton Fanny, MD sent at 03/27/2014  3:18 PM EST ----- Please advise pt regarding following labs... Recent lab result are as follows; LDL ("bad") cholesterol is above normal but remainder of lipid panel is normal. Work on nutritional modifications and regular physical activity. Hepatitis C antibody test is negative. Thyroid tests are normal.  Blood counts are normal. Metabolic labs are normal (sodium, potassium, chloride, calcium, blood sugar, kidney and liver functions). Vitamin D level is low; I am prescribing Vitamin D 50000 units to be taken once a week for several months.  Contact the clinic if you have questions or concerns.  Copy to pt.

## 2014-04-01 ENCOUNTER — Telehealth: Payer: Self-pay | Admitting: *Deleted

## 2014-04-01 NOTE — Telephone Encounter (Signed)
Called patient no answer left message in voice mail to call back.

## 2014-04-01 NOTE — Telephone Encounter (Signed)
Patient called back after receiving the message. Patient would like an appointment sooner than Dec 28th with the Cardiologist because she is having panic attacks. She is afraid something will happen because of the question Dr Leward Quan asked her; has anyone in her family suddenly dropped dead. Patient will call Cardiologist office to see if she can get an appointment sooner. She had symptoms this past weekend of nausea, SOB, and light headiness but did not go to the ED.

## 2014-04-01 NOTE — Telephone Encounter (Signed)
Dr. Leward Quan referred Patient to Cardiologist.  Cardiologist 1st appt was on Dec 28th with a PA.   She is very concerned, since Dr. Leward Quan did not like the results of the EKG she had.  This weekend patient was having nausea, light headiness, and shortness of breath, but decided to not go to the ER.  Pt [phone]

## 2014-04-02 ENCOUNTER — Other Ambulatory Visit: Payer: Self-pay

## 2014-04-02 ENCOUNTER — Other Ambulatory Visit: Payer: Self-pay | Admitting: Radiology

## 2014-04-02 DIAGNOSIS — N6002 Solitary cyst of left breast: Secondary | ICD-10-CM

## 2014-04-02 DIAGNOSIS — N6320 Unspecified lump in the left breast, unspecified quadrant: Secondary | ICD-10-CM

## 2014-04-03 NOTE — Telephone Encounter (Signed)
Spoke to pt she states she has an appointment 04/04/2014 in the morning

## 2014-04-04 ENCOUNTER — Telehealth: Payer: Self-pay

## 2014-04-04 NOTE — Telephone Encounter (Signed)
Pt called and wants to speak with Dr. Leward Quan about the appt she had with her cardiologist today. Please return call and advise. CB # [id number]

## 2014-04-04 NOTE — Telephone Encounter (Signed)
Cardiology facility thought she had an appt for her BP instead of her abnormal EKG. Pt saw Dr. Einar Gip. Pt would really like to have Dr. Leward Quan call her back.

## 2014-04-05 NOTE — Telephone Encounter (Signed)
I called and left a message for pt; I have reviewed the office note faxed to [place] after pt's cardiology evaluation. Pt was seen by NP in consultation w/ Dr. Einar Gip. I expressed confidence in the management plan and agreed w/ PPI treatment for reflux. Advised evaluation at 102 if symptoms recur. Will try to contact pt  within the next 3 days.

## 2014-04-09 NOTE — Telephone Encounter (Signed)
Pt states that she was disappointed about Cardiology [facility]; it seemed unclear to staff there as to why she needed evaluation. Pt was evaluated by nurse practitioner and then seen by Dr. Einar Gip. (Referral diagnosis entered by PA >> HTN). Pt not sure why Dr. Einar Gip insisted that she take PPI; she denies reflux symptoms. Pt states she insisted that stress test be done, otherwise she feels that she would not have been fully evaluated. She needs repeat fasting lipids done; Dr. Einar Gip thinks she may need medication for elevated lipids. Pt states she "started freaking out" when she had panic attack and thought she had to wait too long for referral visit. She still gets anxious and family is concerned that she is not getting appropriately evaluated. I reviewed notes w/ her and reassured her that evaluation was appropriate, though the referral diagnosis was not.   Pt also c/o sore throat, congestion and low grade fever. Denies snoring or mouth-breathing. Took dose of Nyquil but no other remedies. Advised Tylenol, honey and lemon or salt water gargle and OTC elderberry lozenges for sore throat.

## 2014-04-16 ENCOUNTER — Ambulatory Visit (INDEPENDENT_AMBULATORY_CARE_PROVIDER_SITE_OTHER): Payer: BC Managed Care – PPO | Admitting: Family Medicine

## 2014-04-16 ENCOUNTER — Other Ambulatory Visit: Payer: Self-pay | Admitting: Family Medicine

## 2014-04-16 ENCOUNTER — Telehealth: Payer: Self-pay

## 2014-04-16 ENCOUNTER — Ambulatory Visit (INDEPENDENT_AMBULATORY_CARE_PROVIDER_SITE_OTHER): Payer: BC Managed Care – PPO

## 2014-04-16 VITALS — BP 138/80 | HR 90 | Temp 97.8°F | Resp 18 | Ht 66.0 in | Wt 180.0 lb

## 2014-04-16 DIAGNOSIS — J3489 Other specified disorders of nose and nasal sinuses: Secondary | ICD-10-CM

## 2014-04-16 DIAGNOSIS — N6002 Solitary cyst of left breast: Secondary | ICD-10-CM

## 2014-04-16 DIAGNOSIS — I10 Essential (primary) hypertension: Secondary | ICD-10-CM

## 2014-04-16 DIAGNOSIS — R059 Cough, unspecified: Secondary | ICD-10-CM

## 2014-04-16 DIAGNOSIS — R05 Cough: Secondary | ICD-10-CM

## 2014-04-16 DIAGNOSIS — J988 Other specified respiratory disorders: Secondary | ICD-10-CM

## 2014-04-16 DIAGNOSIS — J22 Unspecified acute lower respiratory infection: Secondary | ICD-10-CM

## 2014-04-16 MED ORDER — HYDROCOD POLST-CHLORPHEN POLST 10-8 MG/5ML PO LQCR: 5.0000 mL | Freq: Two times a day (BID) | ORAL | Status: DC | PRN

## 2014-04-16 MED ORDER — BENZONATATE 100 MG PO CAPS: 200.0000 mg | ORAL_CAPSULE | Freq: Two times a day (BID) | ORAL | Status: DC | PRN

## 2014-04-16 MED ORDER — LEVOFLOXACIN 500 MG PO TABS: 500.0000 mg | ORAL_TABLET | Freq: Every day | ORAL | Status: DC

## 2014-04-16 NOTE — Telephone Encounter (Signed)
Spoke to pt. Told pt she should come to the clinic to be evaluated.

## 2014-04-16 NOTE — Telephone Encounter (Signed)
Patient has questions regarding her medication and she has a cough and was told to call immediately as the cough could be caused by the medication.    Thinks she may have the flu.  Does not know.      [phone]

## 2014-04-16 NOTE — Patient Instructions (Signed)
Levofloxacin tablets What is this medicine? LEVOFLOXACIN (lee voe FLOX a sin) is a quinolone antibiotic. It is used to treat certain kinds of bacterial infections. It will not work for colds, flu, or other viral infections. This medicine may be used for other purposes; ask your health care provider or pharmacist if you have questions. COMMON BRAND NAME(S): Levaquin, Levaquin Leva-Pak What should I tell my health care provider before I take this medicine? They need to know if you have any of these conditions: -cerebral disease -irregular heartbeat -kidney disease -seizure disorder -an unusual or allergic reaction to levofloxacin, other antibiotics or medicines, foods, dyes, or preservatives -pregnant or trying to get pregnant -breast-feeding How should I use this medicine? Take this medicine by mouth with a full glass of water. Follow the directions on the prescription label. This medicine can be taken with or without food. Take your medicine at regular intervals. Do not take your medicine more often than directed. Do not skip doses or stop your medicine early even if you feel better. Do not stop taking except on your doctor's advice. A special MedGuide will be given to you by the pharmacist with each prescription and refill. Be sure to read this information carefully each time. Talk to your pediatrician regarding the use of this medicine in children. While this drug may be prescribed for children as young as 6 months for selected conditions, precautions do apply. Overdosage: If you think you have taken too much of this medicine contact a poison control center or emergency room at once. NOTE: This medicine is only for you. Do not share this medicine with others. What if I miss a dose? If you miss a dose, take it as soon as you remember. If it is almost time for your next dose, take only that dose. Do not take double or extra doses. What may interact with this medicine? Do not take this medicine  with any of the following medications: -arsenic trioxide -chloroquine -droperidol -medicines for irregular heart rhythm like amiodarone, disopyramide, dofetilide, flecainide, quinidine, procainamide, sotalol -some medicines for depression or mental problems like phenothiazines, pimozide, and ziprasidone This medicine may also interact with the following medications: -amoxapine -antacids -birth control pills -cisapride -dairy products -didanosine (ddI) buffered tablets or powder -haloperidol -multivitamins -NSAIDS, medicines for pain and inflammation, like ibuprofen or naproxen -retinoid products like tretinoin or isotretinoin -risperidone -some other antibiotics like clarithromycin or erythromycin -sucralfate -theophylline -warfarin This list may not describe all possible interactions. Give your health care provider a list of all the medicines, herbs, non-prescription drugs, or dietary supplements you use. Also tell them if you smoke, drink alcohol, or use illegal drugs. Some items may interact with your medicine. What should I watch for while using this medicine? Tell your doctor or health care professional if your symptoms do not improve or if they get worse. Drink several glasses of water a day and cut down on drinks that contain caffeine. You must not get dehydrated while taking this medicine. You may get drowsy or dizzy. Do not drive, use machinery, or do anything that needs mental alertness until you know how this medicine affects you. Do not sit or stand up quickly, especially if you are an older patient. This reduces the risk of dizzy or fainting spells. This medicine can make you more sensitive to the sun. Keep out of the sun. If you cannot avoid being in the sun, wear protective clothing and use a sunscreen. Do not use sun lamps or tanning beds/booths.   Contact your doctor if you get a sunburn. If you are a diabetic monitor your blood glucose carefully. If you get an unusual  reading stop taking this medicine and call your doctor right away. Do not treat diarrhea with over-the-counter products. Contact your doctor if you have diarrhea that lasts more than 2 days or if the diarrhea is severe and watery. Avoid antacids, calcium, iron, and zinc products for 2 hours before and 2 hours after taking a dose of this medicine. What side effects may I notice from receiving this medicine? Side effects that you should report to your doctor or health care professional as soon as possible: -allergic reactions like skin rash or hives, swelling of the face, lips, or tongue -changes in vision -confusion, nightmares or hallucinations -difficulty breathing -irregular heartbeat, chest pain -joint, muscle or tendon pain -pain or difficulty passing urine -persistent headache with or without blurred vision -redness, blistering, peeling or loosening of the skin, including inside the mouth -seizures -unusual pain, numbness, tingling, or weakness -vaginal irritation, discharge Side effects that usually do not require medical attention (report to your doctor or health care professional if they continue or are bothersome): -diarrhea -dry mouth -headache -stomach upset, nausea -trouble sleeping This list may not describe all possible side effects. Call your doctor for medical advice about side effects. You may report side effects to FDA at 1-800-FDA-1088. Where should I keep my medicine? Keep out of the reach of children. Store at room temperature between 15 and 30 degrees C (59 and 86 degrees F). Keep in a tightly closed container. Throw away any unused medicine after the expiration date. NOTE: This sheet is a summary. It may not cover all possible information. If you have questions about this medicine, talk to your doctor, pharmacist, or health care provider.  2015, Elsevier/Gold Standard. (2012-11-10 07:45:07)    Pneumonia Pneumonia is an infection of the lungs.   CAUSES Pneumonia may be caused by bacteria or a virus. Usually, these infections are caused by breathing infectious particles into the lungs (respiratory tract). SIGNS AND SYMPTOMS   Cough.  Fever.  Chest pain.  Increased rate of breathing.  Wheezing.  Mucus production. DIAGNOSIS  If you have the common symptoms of pneumonia, your health care provider will typically confirm the diagnosis with a chest X-ray. The X-ray will show an abnormality in the lung (pulmonary infiltrate) if you have pneumonia. Other tests of your blood, urine, or sputum may be done to find the specific cause of your pneumonia. Your health care provider may also do tests (blood gases or pulse oximetry) to see how well your lungs are working. TREATMENT  Some forms of pneumonia may be spread to other people when you cough or sneeze. You may be asked to wear a mask before and during your exam. Pneumonia that is caused by bacteria is treated with antibiotic medicine. Pneumonia that is caused by the influenza virus may be treated with an antiviral medicine. Most other viral infections must run their course. These infections will not respond to antibiotics.  HOME CARE INSTRUCTIONS   Cough suppressants may be used if you are losing too much rest. However, coughing protects you by clearing your lungs. You should avoid using cough suppressants if you can.  Your health care provider may have prescribed medicine if he or she thinks your pneumonia is caused by bacteria or influenza. Finish your medicine even if you start to feel better.  Your health care provider may also prescribe an expectorant. This loosens   the mucus to be coughed up.  Take medicines only as directed by your health care provider.  Do not smoke. Smoking is a common cause of bronchitis and can contribute to pneumonia. If you are a smoker and continue to smoke, your cough may last several weeks after your pneumonia has cleared.  A cold steam vaporizer or  humidifier in your room or home may help loosen mucus.  Coughing is often worse at night. Sleeping in a semi-upright position in a recliner or using a couple pillows under your head will help with this.  Get rest as you feel it is needed. Your body will usually let you know when you need to rest. PREVENTION A pneumococcal shot (vaccine) is available to prevent a common bacterial cause of pneumonia. This is usually suggested for:  People over 65 years old.  Patients on chemotherapy.  People with chronic lung problems, such as bronchitis or emphysema.  People with immune system problems. If you are over 65 or have a high risk condition, you may receive the pneumococcal vaccine if you have not received it before. In some countries, a routine influenza vaccine is also recommended. This vaccine can help prevent some cases of pneumonia.You may be offered the influenza vaccine as part of your care. If you smoke, it is time to quit. You may receive instructions on how to stop smoking. Your health care provider can provide medicines and counseling to help you quit. SEEK MEDICAL CARE IF: You have a fever. SEEK IMMEDIATE MEDICAL CARE IF:   Your illness becomes worse. This is especially true if you are elderly or weakened from any other disease.  You cannot control your cough with suppressants and are losing sleep.  You begin coughing up blood.  You develop pain which is getting worse or is uncontrolled with medicines.  Any of the symptoms which initially brought you in for treatment are getting worse rather than better.  You develop shortness of breath or chest pain. MAKE SURE YOU:   Understand these instructions.  Will watch your condition.  Will get help right away if you are not doing well or get worse. Document Released: 04/05/2005 Document Revised: 08/20/2013 Document Reviewed: 06/25/2010 ExitCare Patient Information 2015 [employer]. This information is not intended to replace  advice given to you by your health care provider. Make sure you discuss any questions you have with your health care provider.  

## 2014-04-16 NOTE — Progress Notes (Signed)
Chief Complaint:  Chief Complaint  Patient presents with  . Cough    x 1 week    HPI: Catherine Cole is a 58 y.o. female who is here for  7 day history of sinus draiange and tenderess and productive green now clear cough , she has had no SOB or wheezing or CP but feels her chest is tight, recenly put on lisinopril hctz, not sure if cough related to ACEI, she has tried otc meds, she ahs tried honey and cough drops withotu releif. Wants to make sure she doe snot have any walking PNA.   BP Readings from Last 3 Encounters:  04/16/14 138/80  03/26/14 158/84  02/26/14 138/70   SpO2 Readings from Last 3 Encounters:  04/16/14 95%  03/26/14 96%  02/26/14 96%     Past Medical History  Diagnosis Date  . Depression   . Anxiety   . Anemia    Past Surgical History  Procedure Laterality Date  . Appendectomy    . Cholecystectomy    . Fracture surgery     History   Social History  . Marital Status: Widowed    Spouse Name: N/A    Number of Children: N/A  . Years of Education: N/A   Social History Main Topics  . Smoking status: Former Research scientist (life sciences)  . Smokeless tobacco: None  . Alcohol Use: No  . Drug Use: No  . Sexual Activity: No   Other Topics Concern  . None   Social History Narrative   Family History  Problem Relation Age of Onset  . Heart disease Mother 40    Heart attack  . Alcohol abuse Father   . Diabetes Father    Allergies  Allergen Reactions  . Codeine   . Demerol Nausea And Vomiting  . Erythromycin Itching and Rash  . Neosporin [Neomycin-Polymyxin B Gu] Itching and Rash   Prior to Admission medications   Medication Sig Start Date End Date Taking? Authorizing Provider  amphetamine-dextroamphetamine (ADDERALL) 10 MG tablet Take 1 tablet (10 mg total) by mouth 2 (two) times daily. 02/26/14  Yes Barton Fanny, MD  calcium-vitamin D 250-100 MG-UNIT per tablet Take 1 tablet by mouth 2 (two) times daily.   Yes Historical Provider, MD  escitalopram  (LEXAPRO) 10 MG tablet TAKE 3 TABLETS (30MG ) ONCE A DAY 02/24/14  Yes Mancel Bale, PA-C  lisinopril-hydrochlorothiazide (PRINZIDE,ZESTORETIC) 10-12.5 MG per tablet Take 1 tablet by mouth daily. 03/26/14  Yes Dorian Heckle English, PA  valACYclovir (VALTREX) 500 MG tablet Take 1 tablet daily or as directed. 07/27/13  Yes Barton Fanny, MD  Vitamin D, Ergocalciferol, (DRISDOL) 50000 UNITS CAPS capsule Take 1 capsule by mouth once a week. 03/27/14  Yes Barton Fanny, MD     ROS: The patient denies fevers, chills, night sweats, unintentional weight loss, chest pain, palpitations, wheezing, dyspnea on exertion, nausea, vomiting, abdominal pain, dysuria, hematuria, melena, numbness, weakness, or tingling.   All other systems have been reviewed and were otherwise negative with the exception of those mentioned in the HPI and as above.    PHYSICAL EXAM: Filed Vitals:   04/16/14 1810  BP: 138/80  Pulse: 90  Temp: 97.8 F (36.6 C)  Resp: 18   Filed Vitals:   04/16/14 1810  Height: 5\' 6"  (1.676 m)  Weight: 180 lb (81.647 kg)   Body mass index is 29.07 kg/(m^2).  General: Alert, no acute distress HEENT:  Normocephalic, atraumatic, oropharynx patent. EOMI, PERRLA  Erythematous throat, no exudates, TM normal, + sinus tenderness, + erythematous/boggy nasal mucosa Cardiovascular:  Regular rate and rhythm, no rubs murmurs or gallops.  No Carotid bruits, radial pulse intact. No pedal edema.  Respiratory: Clear to auscultation bilaterally.  No wheezes, rales, or rhonchi.  No cyanosis, no use of accessory musculature GI: No organomegaly, abdomen is soft and non-tender, positive bowel sounds.  No masses. Skin: No rashes. Neurologic: Facial musculature symmetric. Psychiatric: Patient is appropriate throughout our interaction. Lymphatic: No cervical lymphadenopathy Musculoskeletal: Gait intact.   LABS: Results for orders placed or performed in visit on 03/26/14  Lipid panel  Result Value  Ref Range   Cholesterol 200 0 - 200 mg/dL   Triglycerides 76 <150 mg/dL   HDL 50 >39 mg/dL   Total CHOL/HDL Ratio 4.0 Ratio   VLDL 15 0 - 40 mg/dL   LDL Cholesterol 135 (H) 0 - 99 mg/dL  Thyroid Panel With TSH  Result Value Ref Range   T4, Total 7.1 4.5 - 12.0 ug/dL   T3 Uptake 26 22 - 35 %   Free Thyroxine Index 1.8 1.4 - 3.8   TSH 1.535 0.350 - 4.500 uIU/mL  CBC  Result Value Ref Range   WBC 6.7 4.0 - 10.5 K/uL   RBC 4.53 3.87 - 5.11 MIL/uL   Hemoglobin 13.1 12.0 - 15.0 g/dL   HCT 38.0 36.0 - 46.0 %   MCV 83.9 78.0 - 100.0 fL   MCH 28.9 26.0 - 34.0 pg   MCHC 34.5 30.0 - 36.0 g/dL   RDW 13.6 11.5 - 15.5 %   Platelets 277 150 - 400 K/uL   MPV 9.6 9.4 - 12.4 fL  COMPLETE METABOLIC PANEL WITH GFR  Result Value Ref Range   Sodium 140 135 - 145 mEq/L   Potassium 4.4 3.5 - 5.3 mEq/L   Chloride 103 96 - 112 mEq/L   CO2 25 19 - 32 mEq/L   Glucose, Bld 93 70 - 99 mg/dL   BUN 17 6 - 23 mg/dL   Creat 0.79 0.50 - 1.10 mg/dL   Total Bilirubin 0.4 0.2 - 1.2 mg/dL   Alkaline Phosphatase 76 39 - 117 U/L   AST 16 0 - 37 U/L   ALT 16 0 - 35 U/L   Total Protein 7.2 6.0 - 8.3 g/dL   Albumin 4.2 3.5 - 5.2 g/dL   Calcium 9.6 8.4 - 10.5 mg/dL   GFR, Est African American >89 mL/min   GFR, Est Non African American 83 mL/min  Hepatitis C antibody  Result Value Ref Range   HCV Ab NEGATIVE NEGATIVE  Vit D  25 hydroxy (rtn osteoporosis monitoring)  Result Value Ref Range   Vit D, 25-Hydroxy 20 (L) 30 - 100 ng/mL  POCT UA - Microscopic Only  Result Value Ref Range   WBC, Ur, HPF, POC Negative    RBC, urine, microscopic 1-4    Bacteria, U Microscopic 1+    Mucus, UA Negative    Epithelial cells, urine per micros 1-2    Crystals, Ur, HPF, POC Negative    Casts, Ur, LPF, POC Negative    Yeast, UA Negative   POCT urinalysis dipstick  Result Value Ref Range   Color, UA Yellow    Clarity, UA Slightly Cloudy    Glucose, UA Negative    Bilirubin, UA Negative    Ketones, UA Negative     Spec Grav, UA 1.015    Blood, UA Negative    pH, UA  5.0    Protein, UA Negative    Urobilinogen, UA 0.2    Nitrite, UA Negative    Leukocytes, UA Trace   POCT glycosylated hemoglobin (Hb A1C)  Result Value Ref Range   Hemoglobin A1C 5.5      EKG/XRAY:   Primary read interpreted by Dr. Marin Comment at [facility]. RIght lower lobe opacity suspicious for PNA No effusion or penumothorax   ASSESSMENT/PLAN: Encounter Diagnoses  Name Primary?  . Cough   . Sinus drainage   . Lower respiratory infection (e.g., bronchitis, pneumonia, pneumonitis, pulmonitis) Yes   Rx Levaquin for treatment of PNA Rx Tessalon Perles, Tussionex May be ACEI related but doubtful sicne she has opacity in Right mid-lower lung fields on xray F/u in 3-4 weeks for repeat chest xray or prn if worse  Gross sideeffects, risk and benefits, and alternatives of medications d/w patient. Patient is aware that all medications have potential sideeffects and we are unable to predict every sideeffect or drug-drug interaction that may occur.  Edge Mauger, [place], DO 04/16/2014 7:35 PM

## 2014-04-26 ENCOUNTER — Telehealth: Payer: Self-pay

## 2014-04-26 NOTE — Telephone Encounter (Signed)
PA approved for Adderall 10 mg through 04/24/15. Notified pharm

## 2014-04-30 NOTE — Telephone Encounter (Signed)
Old message °

## 2014-05-02 ENCOUNTER — Ambulatory Visit
Admission: RE | Admit: 2014-05-02 | Discharge: 2014-05-02 | Disposition: A | Discharge status: Home / Self Care | Payer: BC Managed Care – PPO | Source: Ambulatory Visit | Attending: Family Medicine | Admitting: Family Medicine

## 2014-05-02 DIAGNOSIS — N6320 Unspecified lump in the left breast, unspecified quadrant: Secondary | ICD-10-CM

## 2014-05-02 DIAGNOSIS — N6002 Solitary cyst of left breast: Secondary | ICD-10-CM

## 2014-05-03 ENCOUNTER — Ambulatory Visit (INDEPENDENT_AMBULATORY_CARE_PROVIDER_SITE_OTHER): Payer: BC Managed Care – PPO | Admitting: Family Medicine

## 2014-05-03 VITALS — BP 134/82 | HR 89 | Temp 98.9°F | Resp 18 | Ht 66.0 in | Wt 177.0 lb

## 2014-05-03 DIAGNOSIS — Z8701 Personal history of pneumonia (recurrent): Secondary | ICD-10-CM

## 2014-05-03 DIAGNOSIS — R059 Cough, unspecified: Secondary | ICD-10-CM

## 2014-05-03 DIAGNOSIS — R05 Cough: Secondary | ICD-10-CM

## 2014-05-03 MED ORDER — HYDROCOD POLST-CHLORPHEN POLST 10-8 MG/5ML PO LQCR: 5.0000 mL | Freq: Two times a day (BID) | ORAL | Status: DC | PRN

## 2014-05-03 MED ORDER — AZITHROMYCIN 250 MG PO TABS: ORAL_TABLET | ORAL | Status: DC

## 2014-05-03 MED ORDER — ALBUTEROL SULFATE HFA 108 (90 BASE) MCG/ACT IN AERS: 2.0000 | INHALATION_SPRAY | RESPIRATORY_TRACT | Status: DC | PRN

## 2014-05-03 NOTE — Patient Instructions (Signed)
Drink plenty of fluids  Get enough rest  Return if not substantially better one week from now  Take the benzonatate cough pills when needed for cough in the daytime  Use the cough syrup primarily at night or when you're not going to be working  Plan to return to work on Tuesday  Take the azithromycin 2 tablets initially, then 1 daily for 4 days for infection  Use the inhaler 2 inhalations 4 times daily as directed  Return at anytime if worse

## 2014-05-03 NOTE — Progress Notes (Signed)
Subjective: Patient was treated a couple of weeks ago for a pneumonia. She finished the course of Levaquin. She continued with a cough. The last few days she has done a little worse, with coughing and had a low-grade fever documented this morning when she went to give blood. She works 2 jobs, including working with [employer] a and [employer] on the weekdays andsecondary job on the weekends. She does not smoke. She has continued to work this week.  Objective: No major distress. TMs are normal. Throat not erythematous. Neck supple without significant nodes. Chest is clear to auscultation. No wheezing could be heard but she is a little tight on forced expiration. Heart regular without any murmurs.  Assessment: Status post pneumonia with persistent cough  Plan: Will try to retreat with a course of azithromycin in case this is an atypical organism such as mycoplasma. Cough medications. See if a bronchodilator, albuterol, will help open up the lung some. She is not doing much better over the next week I would recommend we get a follow-up chest x-ray and consider a course of prednisone.  See instructions

## 2014-05-06 ENCOUNTER — Other Ambulatory Visit: Payer: Self-pay | Admitting: Family Medicine

## 2014-05-28 ENCOUNTER — Ambulatory Visit: Payer: BC Managed Care – PPO | Admitting: Family Medicine

## 2014-05-28 ENCOUNTER — Ambulatory Visit (INDEPENDENT_AMBULATORY_CARE_PROVIDER_SITE_OTHER): Payer: BC Managed Care – PPO | Admitting: Family Medicine

## 2014-05-28 ENCOUNTER — Ambulatory Visit (INDEPENDENT_AMBULATORY_CARE_PROVIDER_SITE_OTHER): Payer: BC Managed Care – PPO

## 2014-05-28 VITALS — BP 122/80 | HR 92 | Temp 98.4°F | Resp 18 | Ht 66.0 in | Wt 175.4 lb

## 2014-05-28 DIAGNOSIS — I10 Essential (primary) hypertension: Secondary | ICD-10-CM

## 2014-05-28 DIAGNOSIS — R05 Cough: Secondary | ICD-10-CM

## 2014-05-28 DIAGNOSIS — R059 Cough, unspecified: Secondary | ICD-10-CM

## 2014-05-28 DIAGNOSIS — J189 Pneumonia, unspecified organism: Secondary | ICD-10-CM

## 2014-05-28 LAB — COMPLETE METABOLIC PANEL WITHOUT GFR
ALT: 14 U/L (ref 0–35)
AST: 16 U/L (ref 0–37)
Creat: 0.74 mg/dL (ref 0.50–1.10)
Glucose, Bld: 93 mg/dL (ref 70–99)

## 2014-05-28 LAB — COMPLETE METABOLIC PANEL WITH GFR
Albumin: 4.4 g/dL (ref 3.5–5.2)
Alkaline Phosphatase: 86 U/L (ref 39–117)
BUN: 12 mg/dL (ref 6–23)
CO2: 29 mEq/L (ref 19–32)
Calcium: 9.7 mg/dL (ref 8.4–10.5)
Chloride: 100 mEq/L (ref 96–112)
GFR, Est African American: >89 mL/min
GFR, Est Non African American: >89 mL/min
Potassium: 4.8 mEq/L (ref 3.5–5.3)
Sodium: 136 mEq/L (ref 135–145)
Total Bilirubin: 0.5 mg/dL (ref 0.2–1.2)
Total Protein: 7.4 g/dL (ref 6.0–8.3)

## 2014-05-28 LAB — POCT CBC
Granulocyte percent: 71.9 %G (ref 37–80)
HCT, POC: 39.9 % (ref 37.7–47.9)
Hemoglobin: 12.8 g/dL (ref 12.2–16.2)
Lymph, poc: 2.7 (ref 0.6–3.4)
MCH, POC: 25.8 pg — AB (ref 27–31.2)
MCHC: 32 g/dL (ref 31.8–35.4)
MCV: 89.2 fL (ref 80–97)
MID (cbc): 0.4 (ref 0–0.9)
MPV: 7.8 fL (ref 0–99.8)
POC Granulocyte: 7.8 — AB (ref 2–6.9)
POC LYMPH PERCENT: 24.6 %L (ref 10–50)
POC MID %: 3.5 %M (ref 0–12)
Platelet Count, POC: 274 10*3/uL (ref 142–424)
RBC: 4.47 M/uL (ref 4.04–5.48)
RDW, POC: 14.2 %
WBC: 10.8 10*3/uL — AB (ref 4.6–10.2)

## 2014-05-28 MED ORDER — DOXYCYCLINE HYCLATE 100 MG PO TABS: 100.0000 mg | ORAL_TABLET | Freq: Two times a day (BID) | ORAL | Status: DC

## 2014-05-28 MED ORDER — IPRATROPIUM BROMIDE 0.02 % IN SOLN
0.5000 mg | Freq: Once | RESPIRATORY_TRACT | Status: AC
Start: 2014-05-28 — End: 2014-05-28
  Administered 2014-05-28: 0.5 mg via RESPIRATORY_TRACT

## 2014-05-28 MED ORDER — ALBUTEROL SULFATE (2.5 MG/3ML) 0.083% IN NEBU
2.5000 mg | INHALATION_SOLUTION | Freq: Once | RESPIRATORY_TRACT | Status: AC
  Administered 2014-05-28: 2.5 mg via RESPIRATORY_TRACT

## 2014-05-28 NOTE — Progress Notes (Signed)
 Chief Complaint:  Chief Complaint  Patient presents with  . Cough    Pt. has a history pneumonia. Still coughing. Pt. does not want to be placed on steroids.     HPI: Catherine Cole is a 59 y.o. female who is here for  Recheck of her PNA which was dx at the end of December She is doing ok, not great. She is still coughing, not bring much up.  Feels feverish and chills She is not having any body aches She was given vaquin  Past Medical History  Diagnosis Date  . Depression   . Anxiety   . Anemia    Past Surgical History  Procedure Laterality Date  . Appendectomy    . Cholecystectomy    . Fracture surgery     History   Social History  . Marital Status: Widowed    Spouse Name: N/A    Number of Children: N/A  . Years of Education: N/A   Social History Main Topics  . Smoking status: Former Research scientist (life sciences)  . Smokeless tobacco: None  . Alcohol Use: No  . Drug Use: No  . Sexual Activity: No   Other Topics Concern  . None   Social History Narrative   Family History  Problem Relation Age of Onset  . Heart disease Mother 52    Heart attack  . Alcohol abuse Father   . Diabetes Father    Allergies  Allergen Reactions  . Codeine   . Demerol Nausea And Vomiting  . Erythromycin Itching and Rash  . Neosporin [Neomycin-Polymyxin B Gu] Itching and Rash   Prior to Admission medications   Medication Sig Start Date End Date Taking? Authorizing Provider  albuterol (PROVENTIL HFA;VENTOLIN HFA) 108 (90 BASE) MCG/ACT inhaler Inhale 2 puffs into the lungs every 4 (four) hours as needed for wheezing or shortness of breath (cough, shortness of breath or wheezing.). 05/03/14  Yes Posey Boyer, MD  amphetamine-dextroamphetamine (ADDERALL) 10 MG tablet Take 1 tablet (10 mg total) by mouth 2 (two) times daily. 02/26/14  Yes Barton Fanny, MD  escitalopram (LEXAPRO) 10 MG tablet TAKE 3 TABLETS (30 MG) ONCE A DAY. 05/06/14  Yes Chelle S Jeffery, PA-C    lisinopril-hydrochlorothiazide (PRINZIDE,ZESTORETIC) 10-12.5 MG per tablet Take 1 tablet by mouth daily. 03/26/14  Yes Dorian Heckle English, PA  valACYclovir (VALTREX) 500 MG tablet Take 1 tablet daily or as directed. 07/27/13  Yes Barton Fanny, MD  Vitamin D, Ergocalciferol, (DRISDOL) 50000 UNITS CAPS capsule Take 1 capsule by mouth once a week. 03/27/14  Yes Barton Fanny, MD  benzonatate (TESSALON) 100 MG capsule Take 2 capsules (200 mg total) by mouth 2 (two) times daily as needed. Patient not taking: Reported on 05/28/2014 04/16/14    P , DO  calcium-vitamin D 250-100 MG-UNIT per tablet Take 1 tablet by mouth 2 (two) times daily.    Historical Provider, MD  chlorpheniramine-HYDROcodone (TUSSIONEX PENNKINETIC ER) 10-8 MG/5ML LQCR Take 5 mLs by mouth every 12 (twelve) hours as needed for cough. She has had this before 05/03/14   Posey Boyer, MD  escitalopram (LEXAPRO) 10 MG tablet TAKE 3 TABLETS (30MG ) ONCE A DAY Patient not taking: Reported on 05/28/2014 02/24/14   Mancel Bale, PA-C     ROS: The patient denies fevers, chills, night sweats, unintentional weight loss, chest pain, palpitations, wheezing, dyspnea on exertion, nausea, vomiting, abdominal pain, dysuria, hematuria, melena, numbness, weakness, or tingling.   All other systems  have been reviewed and were otherwise negative with the exception of those mentioned in the HPI and as above.    PHYSICAL EXAM: Filed Vitals:   05/28/14 1628  BP: 122/80  Pulse: 92  Temp: 98.4 F (36.9 C)  Resp: 18   Filed Vitals:   05/28/14 1628  Height: 5\' 6"  (1.676 m)  Weight: 175 lb 6.4 oz (79.561 kg)   Body mass index is 28.32 kg/(m^2).  General: Alert, no acute distress HEENT:  Normocephalic, atraumatic, oropharynx patent. EOMI, PERRLA Cardiovascular:  Regular rate and rhythm, no rubs murmurs or gallops.  No Carotid bruits, radial pulse intact. No pedal edema.  Respiratory: Clear to auscultation bilaterally.  No wheezes,  rales, or rhonchi.  No cyanosis, no use of accessory musculature GI: No organomegaly, abdomen is soft and non-tender, positive bowel sounds.  No masses. Skin: No rashes. Neurologic: Facial musculature symmetric. Psychiatric: Patient is appropriate throughout our interaction. Lymphatic: No cervical lymphadenopathy Musculoskeletal: Gait intact.   LABS: Results for orders placed or performed in visit on 05/28/14  POCT CBC  Result Value Ref Range   WBC 10.8 (A) 4.6 - 10.2 K/uL   Lymph, poc 2.7 0.6 - 3.4   POC LYMPH PERCENT 24.6 10 - 50 %L   MID (cbc) 0.4 0 - 0.9   POC MID % 3.5 0 - 12 %M   POC Granulocyte 7.8 (A) 2 - 6.9   Granulocyte percent 71.9 37 - 80 %G   RBC 4.47 4.04 - 5.48 M/uL   Hemoglobin 12.8 12.2 - 16.2 g/dL   HCT, POC 39.9 37.7 - 47.9 %   MCV 89.2 80 - 97 fL   MCH, POC 25.8 (A) 27 - 31.2 pg   MCHC 32.0 31.8 - 35.4 g/dL   RDW, POC 14.2 %   Platelet Count, POC 274 142 - 424 K/uL   MPV 7.8 0 - 99.8 fL     EKG/XRAY:   Primary read interpreted by [facility] Marin [facility] at [facility]. RIght LL PNA , I am not sure if resolved   ASSESSMENT/PLAN: Encounter Diagnoses  Name Primary?  . CAP (community acquired pneumonia) Yes  . Essential hypertension   . Cough    Rx Doxycycline Declined steroids Monitor if cough related to ACEI or if realted to PND since started about the same time Fu with Dr Leward Quan Will get labs for HTN checkup on Friday  Gross sideeffects, risk and benefits, and alternatives of medications d/w patient. Patient is aware that all medications have potential sideeffects and we are unable to predict every sideeffect or drug-drug interaction that may occur.  , [place], DO 05/28/2014 5:53 PM

## 2014-05-30 ENCOUNTER — Telehealth: Payer: Self-pay

## 2014-05-30 NOTE — Telephone Encounter (Signed)
Pt called wanting to know if she can come as soon as possible for a steroid shot. CB#[phone]

## 2014-05-30 NOTE — Telephone Encounter (Signed)
Spoke with pt and she would like a round of steroids. She has no fever but her cough is not better. She spoke with her sister and she recommends she get a steroid shot. She has appt with Dr. Leward Quan tomorrow and she will consult this with her.

## 2014-05-31 ENCOUNTER — Ambulatory Visit (INDEPENDENT_AMBULATORY_CARE_PROVIDER_SITE_OTHER): Payer: BC Managed Care – PPO | Admitting: Family Medicine

## 2014-05-31 ENCOUNTER — Encounter: Payer: Self-pay | Admitting: Family Medicine

## 2014-05-31 VITALS — BP 140/84 | HR 88 | Temp 98.0°F | Resp 16 | Ht 66.0 in | Wt 177.0 lb

## 2014-05-31 DIAGNOSIS — F418 Other specified anxiety disorders: Secondary | ICD-10-CM

## 2014-05-31 DIAGNOSIS — Z8249 Family history of ischemic heart disease and other diseases of the circulatory system: Secondary | ICD-10-CM

## 2014-05-31 DIAGNOSIS — R058 Other specified cough: Secondary | ICD-10-CM

## 2014-05-31 DIAGNOSIS — E789 Disorder of lipoprotein metabolism, unspecified: Secondary | ICD-10-CM

## 2014-05-31 DIAGNOSIS — R0789 Other chest pain: Secondary | ICD-10-CM

## 2014-05-31 DIAGNOSIS — I10 Essential (primary) hypertension: Secondary | ICD-10-CM

## 2014-05-31 DIAGNOSIS — R059 Cough, unspecified: Secondary | ICD-10-CM

## 2014-05-31 DIAGNOSIS — R05 Cough: Secondary | ICD-10-CM

## 2014-05-31 MED ORDER — HYDROCOD POLST-CHLORPHEN POLST 10-8 MG/5ML PO LQCR: 5.0000 mL | Freq: Two times a day (BID) | ORAL | Status: DC | PRN

## 2014-05-31 MED ORDER — PREDNISONE 10 MG PO TABS: ORAL_TABLET | ORAL | Status: DC

## 2014-05-31 MED ORDER — ESCITALOPRAM OXALATE 10 MG PO TABS: ORAL_TABLET | ORAL | Status: DC

## 2014-05-31 NOTE — Patient Instructions (Signed)
I have prescribed Prednisone- 10 mg tablets. Take as follows:  Day 1- Take 2 tablets with 3 meals then take 1 less tablet each day, always with food.  I have ordered a new Cardiology referral (probably to the [facility] practice).  It may take about 2 weeks for the appointment to be scheduled.  I want to see you again in 4 weeks. Just take the medication that you have for BP (NOT the Lisinopril- HCTZ). Continue eating well and gradually increase your activity as you see fit. Try to get plenty of rest.

## 2014-06-01 ENCOUNTER — Encounter: Payer: Self-pay | Admitting: *Deleted

## 2014-06-04 NOTE — Progress Notes (Signed)
Subjective:    Patient ID: Catherine Cole, female    DOB: 07-Jul-1955, 59 y.o.   MRN: [id number]  HPI This 59 y.o. Female returns for follow-up of treatment for CAP on 04/16/2014,  05/03/2014 and 05/28/2014. Initially treated w/ Levaquin followed by [facility] and currently on Doxycycline. Pt did not want to take steroids but now wiling to take Prednisone. COough in mostly NP and increased at night. 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 CXR was repeated on 05/28/2013; pt told pneumonia had not resolved > Doxycycline.  HTN- Pt has not been taking Lisinopril-HCTZ (it was discontinued in mid-Dec 2015 to see if this ACEI was causing cough). Cough has persisted as noted above. Temporary quieting w/  Tussionex and tessalon perles. Cepacol lozenges and Mucinex have been helpful.  Depression w/ anxiety- Pt doing well on escitalopram; she is anxious about cough and lack of sleep. No anorexia, dry mouth, GI upset or increased sweating.  Pt was seen by Dr. Einar Gip in consultation re: risk factors for CAD; she has family hx as well as atypical episode of CP w/ palpitations. She did not have a good experience at that specialist office and would like another referral to [facility] for full evaluation. She also wants lipid panel values reviewed; she has made some changes in lifestyle and nutrition since results from 2 months ago. No reports of new episodes of CP, palpitations, SOB or DOE, HA or dizziness.  Patient Active Problem List   Diagnosis Date Noted  . Mixed dyslipidemia 03/26/2014  . IBS (irritable bowel syndrome) 04/03/2013  . Depression with anxiety 04/26/2012  . ADHD (attention deficit hyperactivity disorder) 04/26/2012    Prior to Admission medications   Medication Sig Start Date End Date Taking? Authorizing Provider  albuterol (PROVENTIL HFA;VENTOLIN HFA) 108 (90 BASE) MCG/ACT inhaler Inhale 2 puffs into the lungs every 4 (four) hours as needed for  wheezing or shortness of breath (cough, shortness of breath or wheezing.). 05/03/14  Yes Posey Boyer, MD  amphetamine-dextroamphetamine (ADDERALL) 10 MG tablet Take 1 tablet (10 mg total) by mouth 2 (two) times daily. 02/26/14  Yes Barton Fanny, MD  calcium-vitamin D 250-100 MG-UNIT per tablet Take 1 tablet by mouth 2 (two) times daily.   Yes Historical Provider, MD  chlorpheniramine-HYDROcodone (TUSSIONEX PENNKINETIC ER) 10-8 MG/5ML LQCR Take 5 mLs by mouth every 12 (twelve) hours as needed for cough. She has had this before   Yes- needs refill   doxycycline (VIBRA-TABS) 100 MG tablet Take 1 tablet (100 mg total) by mouth 2 (two) times daily. 05/28/14  Yes Thao P Le, DO  escitalopram (LEXAPRO) 10 MG tablet TAKE 3 TABLETS (30 MG) ONCE A DAY.   Yes Barton Fanny, MD  valACYclovir (VALTREX) 500 MG tablet Take 1 tablet daily or as directed. 07/27/13  Yes Barton Fanny, MD  Vitamin D, Ergocalciferol, (DRISDOL) 50000 UNITS CAPS capsule Take 1 capsule by mouth once a week. 03/27/14  Yes Barton Fanny, MD  benzonatate (TESSALON) 100 MG capsule Take 2 capsules (200 mg total) by mouth 2 (two) times daily as needed. Patient not taking: Reported on 05/28/2014 04/16/14   Glenford Bayley, DO    History   Social History  . Marital Status: Widowed    Spouse Name: N/A  . Number of Children: N/A  . Years of Education: N/A   Occupational History  . Not on file.   Social History Main Topics  .  Smoking status: Former Research scientist (life sciences)  . Smokeless tobacco: Not on file  . Alcohol Use: No  . Drug Use: No  . Sexual Activity: No   Other Topics Concern  . Not on file   Social History Narrative    Family History  Problem Relation Age of Onset  . Heart disease Mother 62    Heart attack  . Alcohol abuse Father   . Diabetes Father     Review of Systems As per HPI.     Objective:   Physical Exam  Constitutional: She is oriented to person, place, and time. She appears well-developed and  well-nourished. No distress.  Blood pressure 140/84, pulse 88, temperature 98 F (36.7 C), temperature source Oral, resp. rate 16, height 5\' 6"  (1.676 m), weight 177 lb (80.287 kg), SpO2 94 %.   HENT:  Head: Normocephalic and atraumatic.  Right Ear: External ear normal.  Left Ear: External ear normal.  Nose: Nose normal.  Mouth/Throat: Oropharynx is clear and moist. No oropharyngeal exudate.  Eyes: Conjunctivae and EOM are normal. Pupils are equal, round, and reactive to light. No scleral icterus.  Neck: Normal range of motion. Neck supple. No thyromegaly present.  Cardiovascular: Normal rate, regular rhythm, normal heart sounds and intact distal pulses.  Exam reveals no gallop and no friction rub.   No murmur heard. Pulmonary/Chest: Effort normal and breath sounds normal. No respiratory distress. She has no wheezes. She has no rales.  Musculoskeletal: Normal range of motion. She exhibits no edema.  Lymphadenopathy:    She has no cervical adenopathy.  Neurological: She is alert and oriented to person, place, and time. No cranial nerve deficit. Coordination normal.  Skin: Skin is warm and dry. No rash noted. She is not diaphoretic. No erythema.  Psychiatric: She has a normal mood and affect. Her behavior is normal. Judgment and thought content normal.  Nursing note and vitals reviewed.   05/28/2014 CXR impression: No active cardiopulmonary disease.     Assessment & Plan:  Upper airway cough syndrome- Short course of Prednisone; continue Mucinex and refill Tussionex cough elixir.  Benign essential HTN - Monitor; will not resume BP medication at this time. Plan: Ambulatory referral to Cardiology  Depression with anxiety- Stable on escitalopram; no reported adverse effects.  Family history of heart disease in female family member before age 68 - Plan: Ambulatory referral to Cardiology  Lipid disorder - Plan: Ambulatory referral to Cardiology  Atypical chest pain - Plan: Ambulatory  referral to Cardiology  Cough - Plan: chlorpheniramine-HYDROcodone (TUSSIONEX PENNKINETIC ER) 10-8 MG/5ML [facility]

## 2014-06-12 ENCOUNTER — Other Ambulatory Visit: Payer: Self-pay | Admitting: Physician Assistant

## 2014-06-28 ENCOUNTER — Ambulatory Visit (INDEPENDENT_AMBULATORY_CARE_PROVIDER_SITE_OTHER): Payer: BC Managed Care – PPO | Admitting: Family Medicine

## 2014-06-28 ENCOUNTER — Encounter: Payer: Self-pay | Admitting: Family Medicine

## 2014-06-28 VITALS — BP 128/86 | HR 88 | Temp 98.6°F | Resp 16 | Ht 66.0 in | Wt 172.4 lb

## 2014-06-28 DIAGNOSIS — E782 Mixed hyperlipidemia: Secondary | ICD-10-CM

## 2014-06-28 DIAGNOSIS — F902 Attention-deficit hyperactivity disorder, combined type: Secondary | ICD-10-CM

## 2014-06-28 DIAGNOSIS — E559 Vitamin D deficiency, unspecified: Secondary | ICD-10-CM | POA: Diagnosis not present

## 2014-06-28 LAB — LDL CHOLESTEROL, DIRECT: Direct LDL: 160 mg/dL — ABNORMAL HIGH

## 2014-06-28 MED ORDER — AMPHETAMINE-DEXTROAMPHETAMINE 10 MG PO TABS: 10.0000 mg | ORAL_TABLET | Freq: Every day | ORAL | Status: DC

## 2014-06-28 MED ORDER — AMPHETAMINE-DEXTROAMPHETAMINE 10 MG PO TABS: 10.0000 mg | ORAL_TABLET | Freq: Two times a day (BID) | ORAL | Status: DC

## 2014-06-28 NOTE — Patient Instructions (Signed)
I have refilled Adderall for 4 months. Contact the office to schedule a visit with your new physician in July 2016.

## 2014-06-29 ENCOUNTER — Encounter: Payer: Self-pay | Admitting: Family Medicine

## 2014-06-29 LAB — VITAMIN D 25 HYDROXY (VIT D DEFICIENCY, FRACTURES): Vit D, 25-Hydroxy: 36 ng/mL (ref 30–100)

## 2014-06-29 NOTE — Progress Notes (Signed)
S:  This 59 y.o. Female has ADHD, treated with Adderall 10 mg 1 tablet twice a day. No adverse effects reported; appetite is good though pt has made some changes that have resulted in 11-pound weight loss since Dec 2015. She denies diaphoresis, recurrent episodes of CP or tightness, palpitations, HA, tremor, dizziness, weakness or syncope. Pt  Was treated for upper airway cough syndrome; she completed Prednisone and has minimal cough or SOB.  Patient Active Problem List   Diagnosis Date Noted  . Mixed dyslipidemia 03/26/2014  . IBS (irritable bowel syndrome) 04/03/2013  . Depression with anxiety 04/26/2012  . ADHD (attention deficit hyperactivity disorder) 04/26/2012    Prior to Admission medications   Medication Sig Start Date End Date Taking? Authorizing Provider  albuterol (PROVENTIL HFA;VENTOLIN HFA) 108 (90 BASE) MCG/ACT inhaler Inhale 2 puffs into the lungs every 4 (four) hours as needed for wheezing or shortness of breath (cough, shortness of breath or wheezing.). 05/03/14  Yes Posey Boyer, MD  amphetamine-dextroamphetamine (ADDERALL) 10 MG tablet Take 1 tablet (10 mg total) by mouth 2 (two) times daily.   Yes Barton Fanny, MD  escitalopram (LEXAPRO) 10 MG tablet TAKE 3 TABLETS (30 MG) ONCE A DAY. 05/31/14  Yes Barton Fanny, MD  valACYclovir (VALTREX) 500 MG tablet Take 1 tablet daily or as directed. 07/27/13  Yes Barton Fanny, MD  Vitamin D, Ergocalciferol, (DRISDOL) 50000 UNITS CAPS capsule Take 1 capsule by mouth once a week. 03/27/14  Yes Barton Fanny, MD  amphetamine-dextroamphetamine (ADDERALL) 10 MG tablet Take 1 tablet (10 mg total) by mouth daily with breakfast.    Barton Fanny, MD  amphetamine-dextroamphetamine (ADDERALL) 10 MG tablet Take 1 tablet (10 mg total) by mouth 2 (two) times daily.    Barton Fanny, MD    [facility] and FAM Hx reviewed.  ROS: As per HPI.  O: Filed Vitals:   06/28/14 1537  BP: 128/86  Pulse: 88  Temp: 98.6 F  (37 C)  Resp: 16    GEN: In NAD; WN,WD. HENT: [place]/AT. EOMI w/ clear conj/sclerae. Otherwise unremarkable. COR: RRR. LUNGS: CTA; no wheezes or rhonchi. SKIN: W&D; intact w/o diaphoresis, erythema or pallor. NEURO: A&O x 3; CNs intact. Nonfocal. PSYCH:  Pleasant and calm demeanor and appropriate affect. Speech is direct and not tangential; thought content is normal and judgement is sound.  A/P: Vitamin D deficiency - Plan: Vitamin D, 25-hydroxy  Attention deficit hyperactivity disorder (ADHD), combined type  Mixed dyslipidemia - Plan: LDL Cholesterol, Direct  Meds ordered this encounter  Medications  . amphetamine-dextroamphetamine (ADDERALL) 10 MG tablet    Sig: Take 1 tablet (10 mg total) by mouth 2 (two) times daily.    Dispense:  60 tablet    Refill:  0  . amphetamine-dextroamphetamine (ADDERALL) 10 MG tablet    Sig: Take 1 tablet (10 mg total) by mouth daily with breakfast.    Dispense:  30 tablet    Refill:  0    May fill on or after July 28, 2014.  Marland Kitchen amphetamine-dextroamphetamine (ADDERALL) 10 MG tablet    Sig: Take 1 tablet (10 mg total) by mouth 2 (two) times daily.    Dispense:  60 tablet    Refill:  0    May fill on or after Aug 27, 2014.  Marland Kitchen amphetamine-dextroamphetamine (ADDERALL) 10 MG tablet    Sig: Take 1 tablet (10 mg total) by mouth 2 (two) times daily.    Dispense:  60 tablet  Refill:  0    May fill on or after September 27, 2014.

## 2014-07-01 NOTE — Progress Notes (Signed)
Quick Note:  Please advise pt regarding following labs...  Your Vitamin D level is improved but still lower than recommended. Continue taking a daily supplement and try to get some sun exposure if you can. Natural sunlight helps your body use the Vitamin D. LDL cholesterol is still above normal. Maintain a healthy lifestyle with good nutrition and regular exercise.  Copy to pt. ______

## 2014-07-28 ENCOUNTER — Other Ambulatory Visit: Payer: Self-pay | Admitting: Family Medicine

## 2014-09-10 ENCOUNTER — Other Ambulatory Visit: Payer: Self-pay | Admitting: Family Medicine

## 2014-09-10 NOTE — Telephone Encounter (Signed)
I refilled prescription Vit D.

## 2014-09-10 NOTE — Telephone Encounter (Signed)
Patient has lost her adderal scrpt - she needs one month for NOW - she must have packed the script and hopes to find the remaining scripts after the move.    [phone]

## 2014-09-10 NOTE — Telephone Encounter (Signed)
Please advise 

## 2014-09-10 NOTE — Telephone Encounter (Signed)
Notes Recorded by Barton Fanny, MD on 07/01/2014 at 4:09 PM Please advise pt regarding following labs...  Your Vitamin D level is improved but still lower than recommended. Continue taking a daily supplement and try to get some sun exposure if you can. Natural sunlight helps your body use the Vitamin D. LDL cholesterol is still above normal. Maintain a healthy lifestyle with good nutrition and regular exercise.  Do you want me to send in Rx for Vit D? Please advise.

## 2014-09-11 ENCOUNTER — Telehealth: Payer: Self-pay

## 2014-09-11 NOTE — Telephone Encounter (Signed)
Pt put message in on 5/24 for refill on adderall, she states that the pharmacist only kept one of her hard copies, and she no longer has the rest.  Pt would need new print out for refills starting on 6/10

## 2014-09-11 NOTE — Telephone Encounter (Signed)
Pt called back and spoke to operator about status of Adderall script. Dr Leward Quan, please see first message below about missing Adderall script.

## 2014-09-12 ENCOUNTER — Telehealth: Payer: Self-pay | Admitting: *Deleted

## 2014-09-12 ENCOUNTER — Other Ambulatory Visit: Payer: Self-pay | Admitting: Family Medicine

## 2014-09-12 MED ORDER — AMPHETAMINE-DEXTROAMPHETAMINE 10 MG PO TABS: 10.0000 mg | ORAL_TABLET | Freq: Two times a day (BID) | ORAL | Status: DC

## 2014-09-12 MED ORDER — AMPHETAMINE-DEXTROAMPHETAMINE 10 MG PO TABS: 10.0000 mg | ORAL_TABLET | Freq: Every day | ORAL | Status: DC

## 2014-09-12 NOTE — Telephone Encounter (Signed)
Adderall prescriptions were printed out for pt; she has been notified that she has 4 months of refills. She has an appt with Dr. Brigitte Pulse in July.

## 2014-09-12 NOTE — Telephone Encounter (Signed)
Patient was called Catherine Cole to up prescriptions for Adderall (May, June, July and August) at [facility]. Patient will come 09-13-14 when office opens at 7:45 am.

## 2014-09-12 NOTE — Telephone Encounter (Signed)
Pt can pick up 3 separate prescriptions for Adderall at [facility] for pick-up on Friday, Sep 13, 2014.

## 2014-09-12 NOTE — Telephone Encounter (Signed)
Patient called earlier because she had 4 RXs on one sheet, the pharmacist filled one of them and gave her the others back. She put them in a box and do not remember which one, she is moving the end of June. Dr Leward Quan has been advised of the situation and will give another RX for May, June and July. I left message in patient's voice mail.

## 2014-11-01 ENCOUNTER — Ambulatory Visit (INDEPENDENT_AMBULATORY_CARE_PROVIDER_SITE_OTHER): Payer: BC Managed Care – PPO | Admitting: Family Medicine

## 2014-11-01 ENCOUNTER — Encounter: Payer: Self-pay | Admitting: Family Medicine

## 2014-11-01 VITALS — BP 158/87 | HR 80 | Temp 98.4°F | Resp 16 | Ht 67.0 in | Wt 174.2 lb

## 2014-11-01 DIAGNOSIS — R0789 Other chest pain: Secondary | ICD-10-CM | POA: Diagnosis not present

## 2014-11-01 DIAGNOSIS — Z8249 Family history of ischemic heart disease and other diseases of the circulatory system: Secondary | ICD-10-CM

## 2014-11-01 DIAGNOSIS — F902 Attention-deficit hyperactivity disorder, combined type: Secondary | ICD-10-CM | POA: Diagnosis not present

## 2014-11-01 MED ORDER — AMPHETAMINE-DEXTROAMPHETAMINE 12.5 MG PO TABS: 12.5000 mg | ORAL_TABLET | Freq: Two times a day (BID) | ORAL | Status: DC

## 2014-11-01 NOTE — Progress Notes (Signed)
Subjective:  This chart was scribed for [name], MD by Thea Alken, ED Scribe. This patient was seen in room 22 and the patient's care was started at 5:26 PM.   Patient ID: Catherine Cole, female    DOB: 10-02-1955, 59 y.o.   MRN: [id number]  HPI  Chief Complaint  Patient presents with  . follow up medications   HPI Comments: Catherine Cole is a 59 y.o. female who presents to the [facility] for a follow up On adderall 10 bid. Had 11 lb weight loss. Seen every 4 mos for refills.  On vit D as was low  Working on tlc for mildly elev LDL.  Was on lisinpril-hctz for blood pressure buyt stopped 03/2014 due to chronic cough  lexapro for depression and anxiety  Saw Dr. Einar Gip due to risk factors for CAD inc FHx and atypical CP/palp but then wanted second opinion elsewhere.  Pt has been on adderall for about 4 years. She takes adderall 10mg  twice a days. She has had more life stressor including currently moving and work stress and would like to increase dose of adderall. Pt thought she was having an anxiety attack which she states was sudden with sharp back pain and SOB. She was referred to Dr. Einar Gip for what she that was for abnormal EKG but instead states he focused primarily on her BP. Pt states 1 week ago she had nausea and somewhat similar pain to what she during panic attack. She has anxiety in the past when her husband passed away.   She takes vitamin D 50000 units once a week. She is not taking calcium. She has been trying to make healthier meal choices, not eating red meat.  Pt has BP cuff at home.  Past Medical History  Diagnosis Date  . Depression   . Anxiety   . Anemia    Past Surgical History  Procedure Laterality Date  . Appendectomy    . Cholecystectomy    . Fracture surgery     Prior to Admission medications   Medication Sig Start Date End Date Taking? Authorizing Provider  albuterol (PROVENTIL HFA;VENTOLIN HFA) 108 (90 BASE) MCG/ACT inhaler  Inhale 2 puffs into the lungs every 4 (four) hours as needed for wheezing or shortness of breath (cough, shortness of breath or wheezing.). 05/03/14  Yes Posey Boyer, MD  amphetamine-dextroamphetamine (ADDERALL) 10 MG tablet Take 1 tablet (10 mg total) by mouth 2 (two) times daily. 09/12/14  Yes Barton Fanny, MD  amphetamine-dextroamphetamine (ADDERALL) 10 MG tablet Take 1 tablet (10 mg total) by mouth 2 (two) times daily. 09/12/14  Yes Barton Fanny, MD  amphetamine-dextroamphetamine (ADDERALL) 10 MG tablet Take 1 tablet (10 mg total) by mouth 2 (two) times daily. 09/12/14  Yes Barton Fanny, MD  amphetamine-dextroamphetamine (ADDERALL) 10 MG tablet Take 1 tablet (10 mg total) by mouth 2 (two) times daily with a meal. 09/12/14  Yes Barton Fanny, MD  escitalopram (LEXAPRO) 10 MG tablet TAKE 3 TABLETS (30 MG) ONCE A DAY. 05/31/14  Yes Barton Fanny, MD  valACYclovir (VALTREX) 500 MG tablet TAKE 1 TABLET BY MOUTH EVERY DAY OR AS DIRECTED. 07/29/14  Yes Chelle Jeffery, PA-C  Vitamin D, Ergocalciferol, (DRISDOL) 50000 UNITS CAPS capsule TAKE 1 CAPSULE BY MOUTH ONCE A WEEK. 09/10/14  Yes Barton Fanny, MD   Depression screen [facility] 2/9 11/01/2014 02/26/2014 07/27/2013  Decreased Interest 1 0 0  Down, Depressed, Hopeless 0 1 1  PHQ - 2 Score 1 1 1      Review of Systems  Constitutional: Negative for fever, chills, diaphoresis, activity change, appetite change, fatigue and unexpected weight change.  Respiratory: Negative for cough, chest tightness, shortness of breath and wheezing.   Cardiovascular: Positive for chest pain and palpitations. Negative for leg swelling.  Gastrointestinal: Negative for nausea and vomiting.  Allergic/Immunologic: Negative for immunocompromised state.  Neurological: Negative for tremors, weakness and numbness.  Hematological: Does not bruise/bleed easily.  Psychiatric/Behavioral: Positive for decreased concentration and agitation. Negative for  behavioral problems, confusion, sleep disturbance and dysphoric mood. The patient is nervous/anxious. The patient is not hyperactive.     Objective:   Physical Exam  Constitutional: She is oriented to person, place, and time. She appears well-developed and well-nourished. No distress.  HENT:  Head: Normocephalic and atraumatic.  Eyes: Conjunctivae and EOM are normal.  Neck: Neck supple. Carotid bruit is not present. No thyromegaly present.  Cardiovascular: Normal rate, regular rhythm, S1 normal, S2 normal and normal heart sounds.   No murmur heard. BP-140/86  Pulmonary/Chest: Effort normal and breath sounds normal. No respiratory distress. She has no wheezes. She has no rales. She exhibits no tenderness.  Musculoskeletal: Normal range of motion.  Lymphadenopathy:    She has no cervical adenopathy.  Neurological: She is alert and oriented to person, place, and time.  Skin: Skin is warm and dry.  Psychiatric: She has a normal mood and affect. Her behavior is normal.  Nursing note and vitals reviewed.   Filed Vitals:   11/01/14 1623  BP: 158/87  Pulse: 80  Temp: 98.4 F (36.9 C)  TempSrc: Oral  Resp: 16  Height: 5\' 7"  (1.702 m)  Weight: 174 lb 3.2 oz (79.017 kg)   Assessment & Plan:   1. Other chest pain   2. Family history of cardiac disorder in mother - pt has been having a ton of trouble getting appropriate referral to see a cardiologist in a second opinion other than [facility] so re-entered referral to [facility].  3. Attention deficit hyperactivity disorder (ADHD), combined type - increased from 10 bid which she has been on for sev yrs to 12.5 bid.  F/u in 4 mos for additional refills.    Orders Placed This Encounter  Procedures  . Ambulatory referral to Cardiology    Referral Priority:  Routine    Referral Type:  Consultation    Referral Reason:  Specialty Services Required    Requested Specialty:  Cardiology    Number of Visits Requested:  1     Meds ordered this encounter  Medications  . amphetamine-dextroamphetamine (ADDERALL) 12.5 MG tablet    Sig: Take 1 tablet by mouth 2 (two) times daily with a meal.    Dispense:  60 tablet    Refill:  0    May fill on or after October 27, 2014.  Marland Kitchen amphetamine-dextroamphetamine (ADDERALL) 12.5 MG tablet    Sig: Take 1 tablet by mouth 2 (two) times daily.    Dispense:  60 tablet    Refill:  0    May fill on or after November 27, 2014.  Marland Kitchen amphetamine-dextroamphetamine (ADDERALL) 12.5 MG tablet    Sig: Take 1 tablet by mouth 2 (two) times daily.    Dispense:  60 tablet    Refill:  0    May fill on or after Sept 10, 2016.  Marland Kitchen amphetamine-dextroamphetamine (ADDERALL) 12.5 MG tablet    Sig: Take 1 tablet by mouth 2 (two)  times daily.    Dispense:  60 tablet    Refill:  0    May fill on or after January 27, 2015.    I personally performed the services described in this documentation, which was scribed in my presence. The recorded information has been reviewed and considered, and addended by me as needed.  [name], MD MPH

## 2014-11-01 NOTE — Progress Notes (Deleted)
On adderall 10 bid.  Had 11 lb weight loss. Seen every 4 mos for refills. On vit D as was low Working on tlc for mildly elev LDL. Was on lisinpril-hctz for blood pressure buyt stopped 03/2014 due to chronic cough lexapro for depression and anxiety Saw Dr. Einar Gip due to risk factors for CAD inc FHx and atypical CP/palp but then wanted second opinion elsewhere.

## 2014-11-05 ENCOUNTER — Telehealth: Payer: Self-pay

## 2014-11-05 NOTE — Telephone Encounter (Signed)
Got faxed req for a new Rx for HCTZ 12.5 mg QD #30. Dr Brigitte Pulse, you just saw pt, but I don't see this med on her med list at [place]. Do you want pt taking it?

## 2014-11-06 MED ORDER — HYDROCHLOROTHIAZIDE 25 MG PO TABS: 25.0000 mg | ORAL_TABLET | Freq: Every day | ORAL | Status: DC

## 2014-11-06 NOTE — Telephone Encounter (Signed)
Yes, she was prev on lisinopril-hctz but it was stopped due to cough and BP borderline so lets restart - hctz 25 qd sent to pharmacy.  Pt can continue to monitor her BP at home and cut it in half if she develops orthostatic sxs from it.  High K diet.

## 2014-11-11 NOTE — Telephone Encounter (Signed)
LMOM for pt to CB to give directions from Dr Brigitte Pulse below.

## 2014-11-11 NOTE — Telephone Encounter (Signed)
Pt CB and reported that she had been taking the 12.5 mg when she saw Dr Brigitte Pulse, but did not discuss it bc she didn't need RFs at the time. She agreed to inc to 25 mg though and cut in 1/2 if BP drops too low. Discussed high K diet.

## 2014-12-03 ENCOUNTER — Ambulatory Visit (INDEPENDENT_AMBULATORY_CARE_PROVIDER_SITE_OTHER): Payer: BC Managed Care – PPO

## 2014-12-03 ENCOUNTER — Ambulatory Visit (INDEPENDENT_AMBULATORY_CARE_PROVIDER_SITE_OTHER): Payer: BC Managed Care – PPO | Admitting: Family Medicine

## 2014-12-03 VITALS — BP 160/92 | HR 83 | Temp 98.7°F | Resp 18 | Ht 67.0 in | Wt 173.0 lb

## 2014-12-03 DIAGNOSIS — R0781 Pleurodynia: Secondary | ICD-10-CM

## 2014-12-03 DIAGNOSIS — M546 Pain in thoracic spine: Secondary | ICD-10-CM

## 2014-12-03 DIAGNOSIS — M25539 Pain in unspecified wrist: Secondary | ICD-10-CM

## 2014-12-03 DIAGNOSIS — R519 Headache, unspecified: Secondary | ICD-10-CM

## 2014-12-03 DIAGNOSIS — R51 Headache: Secondary | ICD-10-CM

## 2014-12-03 DIAGNOSIS — S62501A Fracture of unspecified phalanx of right thumb, initial encounter for closed fracture: Secondary | ICD-10-CM | POA: Diagnosis not present

## 2014-12-03 DIAGNOSIS — T148XXA Other injury of unspecified body region, initial encounter: Secondary | ICD-10-CM

## 2014-12-03 DIAGNOSIS — T148 Other injury of unspecified body region: Secondary | ICD-10-CM | POA: Diagnosis not present

## 2014-12-03 DIAGNOSIS — M810 Age-related osteoporosis without current pathological fracture: Secondary | ICD-10-CM

## 2014-12-03 DIAGNOSIS — S060X0A Concussion without loss of consciousness, initial encounter: Secondary | ICD-10-CM | POA: Diagnosis not present

## 2014-12-03 DIAGNOSIS — R079 Chest pain, unspecified: Secondary | ICD-10-CM

## 2014-12-03 MED ORDER — TIZANIDINE HCL 2 MG PO CAPS: 2.0000 mg | ORAL_CAPSULE | Freq: Three times a day (TID) | ORAL | Status: DC | PRN

## 2014-12-03 MED ORDER — NAPROXEN 500 MG PO TABS: 500.0000 mg | ORAL_TABLET | Freq: Two times a day (BID) | ORAL | Status: DC

## 2014-12-03 NOTE — Progress Notes (Signed)
 Chief Complaint:  Chief Complaint  Patient presents with  . Fall    Last night hanging shower curtain    HPI: Catherine Cole is a 59 y.o. female who reports to [facility] today complaining of fall last night while tryign to hang new curtains up in her new apt. She is not confused but has bruises and also has been wanting to sleep more. She has SOB and chest pain with deep breaths. She has osteoporosis. She did hit her face on the left side, she fell on the bathroom ceramic and did hit her head.She is not sure if she had LOC. Currently no nausea, slight headache, no vomiting, no confusion, no blurred or double vision. She has no gait changes. She works a [employer] and is on the computer a lot and is wondering if she can go back to work.   Past Medical History  Diagnosis Date  . Depression   . Anxiety   . Anemia    Past Surgical History  Procedure Laterality Date  . Appendectomy    . Cholecystectomy    . Fracture surgery     Social History   Social History  . Marital Status: Widowed    Spouse Name: N/A  . Number of Children: N/A  . Years of Education: N/A   Social History Main Topics  . Smoking status: Former Research scientist (life sciences)  . Smokeless tobacco: None  . Alcohol Use: No  . Drug Use: No  . Sexual Activity: No   Other Topics Concern  . None   Social History Narrative   Family History  Problem Relation Age of Onset  . Heart disease Mother 61    Heart attack  . Alcohol abuse Father   . Diabetes Father    Allergies  Allergen Reactions  . Codeine   . Demerol Nausea And Vomiting  . Erythromycin Itching and Rash  . Neosporin [Neomycin-Polymyxin B Gu] Itching and Rash   Prior to Admission medications   Medication Sig Start Date End Date Taking? Authorizing Provider  amphetamine-dextroamphetamine (ADDERALL) 12.5 MG tablet Take 1 tablet by mouth 2 (two) times daily with a meal. 11/01/14  Yes Shawnee Knapp, MD  amphetamine-dextroamphetamine (ADDERALL) 12.5 MG tablet Take 1 tablet by  mouth 2 (two) times daily. 11/01/14  Yes Shawnee Knapp, MD  amphetamine-dextroamphetamine (ADDERALL) 12.5 MG tablet Take 1 tablet by mouth 2 (two) times daily. 11/01/14  Yes Shawnee Knapp, MD  amphetamine-dextroamphetamine (ADDERALL) 12.5 MG tablet Take 1 tablet by mouth 2 (two) times daily. 11/01/14  Yes Shawnee Knapp, MD  escitalopram (LEXAPRO) 10 MG tablet TAKE 3 TABLETS (30 MG) ONCE A DAY. 05/31/14  Yes Barton Fanny, MD  hydrochlorothiazide (HYDRODIURIL) 25 MG tablet Take 1 tablet (25 mg total) by mouth daily. 11/06/14  Yes Shawnee Knapp, MD  valACYclovir (VALTREX) 500 MG tablet TAKE 1 TABLET BY MOUTH EVERY DAY OR AS DIRECTED. 07/29/14  Yes Chelle Jeffery, PA-C  Vitamin D, Ergocalciferol, (DRISDOL) 50000 UNITS CAPS capsule TAKE 1 CAPSULE BY MOUTH ONCE A WEEK. 09/10/14  Yes Barton Fanny, MD  albuterol (PROVENTIL HFA;VENTOLIN HFA) 108 (90 BASE) MCG/ACT inhaler Inhale 2 puffs into the lungs every 4 (four) hours as needed for wheezing or shortness of breath (cough, shortness of breath or wheezing.). Patient not taking: Reported on 12/03/2014 05/03/14   Posey Boyer, MD     ROS: The patient denies fevers, chills, night sweats, unintentional weight loss, chest pain, palpitations, wheezing, dyspnea  on exertion, nausea, vomiting, abdominal pain, dysuria, hematuria, melena, numbness, weakness, or tingling.   All other systems have been reviewed and were otherwise negative with the exception of those mentioned in the HPI and as above.    PHYSICAL EXAM: Filed Vitals:   12/03/14 1145  BP: 160/92  Pulse: 83  Temp: 98.7 F (37.1 C)  Resp: 18   Body mass index is 27.09 kg/(m^2).   General: Alert, no acute distress HEENT:  Normocephalic, atraumatic, oropharynx patent. EOMI, PERRLA, fundoscopic exam  normal Cardiovascular:  Regular rate and rhythm, no rubs murmurs or gallops.  No Carotid bruits, radial pulse intact. No pedal edema.  Respiratory: Clear to auscultation bilaterally.  No wheezes, rales, or  rhonchi.  No cyanosis, no use of accessory musculature Abdominal: No organomegaly, abdomen is soft and non-tender, positive bowel sounds. No masses. Skin: No rashes. Neurologic: Facial musculature symmetric. Psychiatric: Patient acts appropriately throughout our interaction. Lymphatic: No cervical or submandibular lymphadenopathy Musculoskeletal: Gait intact. No edema, tenderness Normal neck exam Neg shoudler tenderness, nl shoudler exam She has a left eyebrow ecchymosis, she has a nasal bridge hematoma Thoracic tendreness, left rib tenderness diffusely along lower rib 7-10 + ecchymosis CN 2-12 grossly normal   LABS: Results for orders placed or performed in visit on 06/28/14  Vitamin D, 25-hydroxy  Result Value Ref Range   Vit D, 25-Hydroxy 36 30 - 100 ng/mL  LDL Cholesterol, Direct  Result Value Ref Range   Direct LDL 160 (H) mg/dL     EKG/XRAY:   Primary read interpreted by Dr. Marin Comment at [facility]. Facial bones-neg fracture Thoracic, and chest and ribs arll normal Right wrist -normal Right thumb-fracture  ft hand-normal   ASSESSMENT/PLAN: Encounter Diagnoses  Name Primary?  . Chest pain, unspecified chest pain type Yes  . Facial pain   . Wrist pain, acute, unspecified laterality   . Rib pain on left side   . Midline thoracic back pain   . Osteoporosis   . Thumb fracture, right, closed, initial encounter   . Contusion   . Concussion without loss of consciousness, initial encounter    Rx Naprosyn and zanaflex Aluminum thumb splint Concussion sxs and precautions given, avoid TV, computers, cell phone for next 12-24 hours Fu in 1.5-2 weeks FU  Prn   Gross sideeffects, risk and benefits, and alternatives of medications d/w patient. Patient is aware that all medications have potential sideeffects and we are unable to predict every sideeffect or drug-drug interaction that may occur.    DO  12/03/2014 2:07 PM   12/06/14-LM for patient about xrays and also to  return to office in 1.5-2 weeks or sooner prn  for recheck.

## 2014-12-03 NOTE — Patient Instructions (Addendum)
Contusion °A contusion is a deep bruise. Contusions are the result of an injury that caused bleeding under the skin. The contusion may turn blue, purple, or yellow. Minor injuries will give you a painless contusion, but more severe contusions may stay painful and swollen for a few weeks.  °CAUSES  °A contusion is usually caused by a blow, trauma, or direct force to an area of the body. °SYMPTOMS  °· Swelling and redness of the injured area. °· Bruising of the injured area. °· Tenderness and soreness of the injured area. °· Pain. °DIAGNOSIS  °The diagnosis can be made by taking a history and physical exam. An X-ray, CT scan, or MRI may be needed to determine if there were any associated injuries, such as fractures. °TREATMENT  °Specific treatment will depend on what area of the body was injured. In general, the best treatment for a contusion is resting, icing, elevating, and applying cold compresses to the injured area. Over-the-counter medicines may also be recommended for pain control. Ask your caregiver what the best treatment is for your contusion. °HOME CARE INSTRUCTIONS  °· Put ice on the injured area. °· Put ice in a plastic bag. °· Place a towel between your skin and the bag. °· Leave the ice on for 15-20 minutes, 3-4 times a day, or as directed by your health care provider. °· Only take over-the-counter or prescription medicines for pain, discomfort, or fever as directed by your caregiver. Your caregiver may recommend avoiding anti-inflammatory medicines (aspirin, ibuprofen, and naproxen) for 48 hours because these medicines may increase bruising. °· Rest the injured area. °· If possible, elevate the injured area to reduce swelling. °SEEK IMMEDIATE MEDICAL CARE IF:  °· You have increased bruising or swelling. °· You have pain that is getting worse. °· Your swelling or pain is not relieved with medicines. °MAKE SURE YOU:  °· Understand these instructions. °· Will watch your condition. °· Will get help right  away if you are not doing well or get worse. °Document Released: 01/13/2005 Document Revised: 04/10/2013 Document Reviewed: 02/08/2011 °ExitCare® Patient Information ©2015 [employer]. This information is not intended to replace advice given to you by your health care provider. Make sure you discuss any questions you have with your health care provider. ° ° °Head Injury °You have received a head injury. It does not appear serious at this time. Headaches and vomiting are common following head injury. It should be easy to awaken from sleeping. Sometimes it is necessary for you to stay in the emergency department for a while for observation. Sometimes admission to the hospital may be needed. After injuries such as yours, most problems occur within the first 24 hours, but side effects may occur up to 7-10 days after the injury. It is important for you to carefully monitor your condition and contact your health care provider or seek immediate medical care if there is a change in your condition. °WHAT ARE THE TYPES OF HEAD INJURIES? °Head injuries can be as minor as a bump. Some head injuries can be more severe. More severe head injuries include: °· A jarring injury to the brain (concussion). °· A bruise of the brain (contusion). This mean there is bleeding in the brain that can cause swelling. °· A cracked skull (skull fracture). °· Bleeding in the brain that collects, clots, and forms a bump (hematoma). °WHAT CAUSES A HEAD INJURY? °A serious head injury is most likely to happen to someone who is in a car wreck and is not   wearing a seat belt. Other causes of major head injuries include bicycle or motorcycle accidents, sports injuries, and falls. °HOW ARE HEAD INJURIES DIAGNOSED? °A complete history of the event leading to the injury and your current symptoms will be helpful in diagnosing head injuries. Many times, pictures of the brain, such as CT or MRI are needed to see the extent of the injury. Often, an overnight  hospital stay is necessary for observation.  °WHEN SHOULD I SEEK IMMEDIATE MEDICAL CARE?  °You should get help right away if: °· You have confusion or drowsiness. °· You feel sick to your stomach (nauseous) or have continued, forceful vomiting. °· You have dizziness or unsteadiness that is getting worse. °· You have severe, continued headaches not relieved by medicine. Only take over-the-counter or prescription medicines for pain, fever, or discomfort as directed by your health care provider. °· You do not have normal function of the arms or legs or are unable to walk. °· You notice changes in the black spots in the center of the colored part of your eye (pupil). °· You have a clear or bloody fluid coming from your nose or ears. °· You have a loss of vision. °During the next 24 hours after the injury, you must stay with someone who can watch you for the warning signs. This person should contact local emergency services (911 in the [place] if you have seizures, you become unconscious, or you are unable to wake up. °HOW CAN I PREVENT A HEAD INJURY IN THE FUTURE? °The most important factor for preventing major head injuries is avoiding motor vehicle accidents.  To minimize the potential for damage to your head, it is crucial to wear seat belts while riding in motor vehicles. Wearing helmets while bike riding and playing collision sports (like football) is also helpful. Also, avoiding dangerous activities around the house will further help reduce your risk of head injury.  °WHEN CAN I RETURN TO NORMAL ACTIVITIES AND ATHLETICS? °You should be reevaluated by your health care provider before returning to these activities. If you have any of the following symptoms, you should not return to activities or contact sports until 1 week after the symptoms have stopped: °· Persistent headache. °· Dizziness or vertigo. °· Poor attention and concentration. °· Confusion. °· Memory problems. °· Nausea or vomiting. °· Fatigue or tire  easily. °· Irritability. °· Intolerant of bright lights or loud noises. °· Anxiety or depression. °· Disturbed sleep. °MAKE SURE YOU:  °· Understand these instructions. °· Will watch your condition. °· Will get help right away if you are not doing well or get worse. °Document Released: 04/05/2005 Document Revised: 04/10/2013 Document Reviewed: 12/11/2012 °ExitCare® Patient Information ©2015 [employer]. This information is not intended to replace advice given to you by your health care provider. Make sure you discuss any questions you have with your health care provider. ° °

## 2014-12-11 ENCOUNTER — Other Ambulatory Visit: Payer: Self-pay

## 2014-12-11 MED ORDER — ESCITALOPRAM OXALATE 10 MG PO TABS: ORAL_TABLET | ORAL | Status: DC

## 2014-12-12 ENCOUNTER — Telehealth: Payer: Self-pay

## 2014-12-12 NOTE — Telephone Encounter (Signed)
Pt is wanting to talk with someone about what the status of her arm problem it has started to swell up again

## 2014-12-14 NOTE — Telephone Encounter (Signed)
LM to call us back about arm, I will try calling her again.

## 2014-12-14 NOTE — Telephone Encounter (Signed)
Attempted to call again, no pickup

## 2014-12-14 NOTE — Telephone Encounter (Signed)
Gross sideeffects, risk and benefits, and alternatives of medications d/w patient. Patient is aware that all medications have potential sideeffects and we are unable to predict every sideeffect or drug-drug interaction that may occur.  Thao Le DO  12/03/2014 2:07 PM   12/06/14-LM for patient about xrays and also to return to office in 1.5-2 weeks or sooner prn for recheck.

## 2014-12-17 ENCOUNTER — Ambulatory Visit (INDEPENDENT_AMBULATORY_CARE_PROVIDER_SITE_OTHER): Payer: BC Managed Care – PPO

## 2014-12-17 ENCOUNTER — Ambulatory Visit (INDEPENDENT_AMBULATORY_CARE_PROVIDER_SITE_OTHER): Payer: BC Managed Care – PPO | Admitting: Family Medicine

## 2014-12-17 VITALS — BP 122/80 | HR 78 | Temp 99.5°F | Resp 16 | Ht 67.0 in | Wt 173.8 lb

## 2014-12-17 DIAGNOSIS — F05 Delirium due to known physiological condition: Secondary | ICD-10-CM | POA: Diagnosis not present

## 2014-12-17 DIAGNOSIS — S62501D Fracture of unspecified phalanx of right thumb, subsequent encounter for fracture with routine healing: Secondary | ICD-10-CM | POA: Diagnosis not present

## 2014-12-17 DIAGNOSIS — R42 Dizziness and giddiness: Secondary | ICD-10-CM

## 2014-12-17 DIAGNOSIS — R410 Disorientation, unspecified: Secondary | ICD-10-CM

## 2014-12-17 DIAGNOSIS — H539 Unspecified visual disturbance: Secondary | ICD-10-CM | POA: Diagnosis not present

## 2014-12-17 NOTE — Patient Instructions (Signed)
Concussion  A concussion, or closed-head injury, is a brain injury caused by a direct blow to the head or by a quick and sudden movement (jolt) of the head or neck. Concussions are usually not life-threatening. Even so, the effects of a concussion can be serious. If you have had a concussion before, you are more likely to experience concussion-like symptoms after a direct blow to the head.   CAUSES  · Direct blow to the head, such as from running into another player during a soccer game, being hit in a fight, or hitting your head on a hard surface.  · A jolt of the head or neck that causes the brain to move back and forth inside the skull, such as in a car crash.  SIGNS AND SYMPTOMS  The signs of a concussion can be hard to notice. Early on, they may be missed by you, family members, and health care providers. You may look fine but act or feel differently.  Symptoms are usually temporary, but they may last for days, weeks, or even longer. Some symptoms may appear right away while others may not show up for hours or days. Every head injury is different. Symptoms include:  · Mild to moderate headaches that will not go away.  · A feeling of pressure inside your head.  · Having more trouble than usual:  ¨ Learning or remembering things you have heard.  ¨ Answering questions.  ¨ Paying attention or concentrating.  ¨ Organizing daily tasks.  ¨ Making decisions and solving problems.  · Slowness in thinking, acting or reacting, speaking, or reading.  · Getting lost or being easily confused.  · Feeling tired all the time or lacking energy (fatigued).  · Feeling drowsy.  · Sleep disturbances.  ¨ Sleeping more than usual.  ¨ Sleeping less than usual.  ¨ Trouble falling asleep.  ¨ Trouble sleeping (insomnia).  · Loss of balance or feeling lightheaded or dizzy.  · Nausea or vomiting.  · Numbness or tingling.  · Increased sensitivity to:  ¨ Sounds.  ¨ Lights.  ¨ Distractions.  · Vision problems or eyes that tire  easily.  · Diminished sense of taste or smell.  · Ringing in the ears.  · Mood changes such as feeling sad or anxious.  · Becoming easily irritated or angry for little or no reason.  · Lack of motivation.  · Seeing or hearing things other people do not see or hear (hallucinations).  DIAGNOSIS  Your health care provider can usually diagnose a concussion based on a description of your injury and symptoms. He or she will ask whether you passed out (lost consciousness) and whether you are having trouble remembering events that happened right before and during your injury.  Your evaluation might include:  · A brain scan to look for signs of injury to the brain. Even if the test shows no injury, you may still have a concussion.  · Blood tests to be sure other problems are not present.  TREATMENT  · Concussions are usually treated in an emergency department, in urgent care, or at a clinic. You may need to stay in the hospital overnight for further treatment.  · Tell your health care provider if you are taking any medicines, including prescription medicines, over-the-counter medicines, and natural remedies. Some medicines, such as blood thinners (anticoagulants) and aspirin, may increase the chance of complications. Also tell your health care provider whether you have had alcohol or are taking illegal drugs. This information   may affect treatment.  · Your health care provider will send you home with important instructions to follow.  · How fast you will recover from a concussion depends on many factors. These factors include how severe your concussion is, what part of your brain was injured, your age, and how healthy you were before the concussion.  · Most people with mild injuries recover fully. Recovery can take time. In general, recovery is slower in older persons. Also, persons who have had a concussion in the past or have other medical problems may find that it takes longer to recover from their current injury.  HOME  CARE INSTRUCTIONS  General Instructions  · Carefully follow the directions your health care provider gave you.  · Only take over-the-counter or prescription medicines for pain, discomfort, or fever as directed by your health care provider.  · Take only those medicines that your health care provider has approved.  · Do not drink alcohol until your health care provider says you are well enough to do so. Alcohol and certain other drugs may slow your recovery and can put you at risk of further injury.  · If it is harder than usual to remember things, write them down.  · If you are easily distracted, try to do one thing at a time. For example, do not try to watch TV while fixing dinner.  · Talk with family members or close friends when making important decisions.  · Keep all follow-up appointments. Repeated evaluation of your symptoms is recommended for your recovery.  · Watch your symptoms and tell others to do the same. Complications sometimes occur after a concussion. Older adults with a brain injury may have a higher risk of serious complications, such as a blood clot on the brain.  · Tell your teachers, school nurse, school counselor, coach, athletic trainer, or work manager about your injury, symptoms, and restrictions. Tell them about what you can or cannot do. They should watch for:  ¨ Increased problems with attention or concentration.  ¨ Increased difficulty remembering or learning new information.  ¨ Increased time needed to complete tasks or assignments.  ¨ Increased irritability or decreased ability to cope with stress.  ¨ Increased symptoms.  · Rest. Rest helps the brain to heal. Make sure you:  ¨ Get plenty of sleep at night. Avoid staying up late at night.  ¨ Keep the same bedtime hours on weekends and weekdays.  ¨ Rest during the day. Take daytime naps or rest breaks when you feel tired.  · Limit activities that require a lot of thought or concentration. These include:  ¨ Doing homework or job-related  work.  ¨ Watching TV.  ¨ Working on the computer.  · Avoid any situation where there is potential for another head injury (football, hockey, soccer, basketball, martial arts, downhill snow sports and horseback riding). Your condition will get worse every time you experience a concussion. You should avoid these activities until you are evaluated by the appropriate follow-up health care providers.  Returning To Your Regular Activities  You will need to return to your normal activities slowly, not all at once. You must give your body and brain enough time for recovery.  · Do not return to sports or other athletic activities until your health care provider tells you it is safe to do so.  · Ask your health care provider when you can drive, ride a bicycle, or operate heavy machinery. Your ability to react may be slower after a   brain injury. Never do these activities if you are dizzy.  · Ask your health care provider about when you can return to work or school.  Preventing Another Concussion  It is very important to avoid another brain injury, especially before you have recovered. In rare cases, another injury can lead to permanent brain damage, brain swelling, or death. The risk of this is greatest during the first 7-10 days after a head injury. Avoid injuries by:  · Wearing a seat belt when riding in a car.  · Drinking alcohol only in moderation.  · Wearing a helmet when biking, skiing, skateboarding, skating, or doing similar activities.  · Avoiding activities that could lead to a second concussion, such as contact or recreational sports, until your health care provider says it is okay.  · Taking safety measures in your home.  ¨ Remove clutter and tripping hazards from floors and stairways.  ¨ Use grab bars in bathrooms and handrails by stairs.  ¨ Place non-slip mats on floors and in bathtubs.  ¨ Improve lighting in dim areas.  SEEK MEDICAL CARE IF:  · You have increased problems paying attention or  concentrating.  · You have increased difficulty remembering or learning new information.  · You need more time to complete tasks or assignments than before.  · You have increased irritability or decreased ability to cope with stress.  · You have more symptoms than before.  Seek medical care if you have any of the following symptoms for more than 2 weeks after your injury:  · Lasting (chronic) headaches.  · Dizziness or balance problems.  · Nausea.  · Vision problems.  · Increased sensitivity to noise or light.  · Depression or mood swings.  · Anxiety or irritability.  · Memory problems.  · Difficulty concentrating or paying attention.  · Sleep problems.  · Feeling tired all the time.  SEEK IMMEDIATE MEDICAL CARE IF:  · You have severe or worsening headaches. These may be a sign of a blood clot in the brain.  · You have weakness (even if only in one hand, leg, or part of the face).  · You have numbness.  · You have decreased coordination.  · You vomit repeatedly.  · You have increased sleepiness.  · One pupil is larger than the other.  · You have convulsions.  · You have slurred speech.  · You have increased confusion. This may be a sign of a blood clot in the brain.  · You have increased restlessness, agitation, or irritability.  · You are unable to recognize people or places.  · You have neck pain.  · It is difficult to wake you up.  · You have unusual behavior changes.  · You lose consciousness.  MAKE SURE YOU:  · Understand these instructions.  · Will watch your condition.  · Will get help right away if you are not doing well or get worse.  Document Released: 06/26/2003 Document Revised: 04/10/2013 Document Reviewed: 10/26/2012  ExitCare® Patient Information ©2015 [employer]. This information is not intended to replace advice given to you by your health care provider. Make sure you discuss any questions you have with your health care provider.

## 2014-12-17 NOTE — Progress Notes (Signed)
Chief Complaint:  Chief Complaint  Patient presents with  . Follow-up    right wrist    HPI: Catherine Cole is a 59 y.o. female who [facility] complaining of:  1. Head concussion sxs after fall, She has had some fuzzinees, is not thinking as quickly as she normally does, she had an increase in [place] but is actually only on 25 mg. So not sure if that is related to it. SHe has had some difficulty putting words in order, for example would want to say backyard but would say yardback. She will She has had 2 floaters and now there are more floaters, she has no double vision. She has had some vision fuzziness. Her eye burn. She ahs gone back to work full time , she works in TEFL teacher at [employer] and has not really taken any time off, she has to be looking t th computer screena nd crunching numbers and lookinga t spreadsheets. She has been more clumsy than normal. She was standing in her slippers and she felt she slid to the side of it. Denies any increase lethargy,   2. She has a right thumb avulsion fracture, doing well in splint. She is here for a 2 week post injury recheck.   Past Medical History  Diagnosis Date  . Depression   . Anxiety   . Anemia    Past Surgical History  Procedure Laterality Date  . Appendectomy    . Cholecystectomy    . Fracture surgery     Social History   Social History  . Marital Status: Widowed    Spouse Name: N/A  . Number of Children: N/A  . Years of Education: N/A   Social History Main Topics  . Smoking status: Former Research scientist (life sciences)  . Smokeless tobacco: None  . Alcohol Use: No  . Drug Use: No  . Sexual Activity: No   Other Topics Concern  . None   Social History Narrative   Family History  Problem Relation Age of Onset  . Heart disease Mother 44    Heart attack  . Alcohol abuse Father   . Diabetes Father    Allergies  Allergen Reactions  . Codeine   . Demerol Nausea And Vomiting  .  Erythromycin Itching and Rash  . Neosporin [Neomycin-Polymyxin B Gu] Itching and Rash   Prior to Admission medications   Medication Sig Start Date End Date Taking? Authorizing Provider  amphetamine-dextroamphetamine (ADDERALL) 12.5 MG tablet Take 1 tablet by mouth 2 (two) times daily with a meal. 11/01/14  Yes Shawnee Knapp, MD  escitalopram (LEXAPRO) 10 MG tablet TAKE 3 TABLETS (30 MG) ONCE A DAY. 12/11/14  Yes Shawnee Knapp, MD  hydrochlorothiazide (HYDRODIURIL) 25 MG tablet Take 1 tablet (25 mg total) by mouth daily. 11/06/14  Yes Shawnee Knapp, MD  naproxen (NAPROSYN) 500 MG tablet Take 1 tablet (500 mg total) by mouth 2 (two) times daily with a meal. No other NSAIDs, take with food. 12/03/14  Yes Flora Parks P Deontrey Massi, DO  tizanidine (ZANAFLEX) 2 MG capsule Take 1 capsule (2 mg total) by mouth 3 (three) times daily as needed for muscle spasms. 12/03/14  Yes Henrique Parekh P Aster Screws, DO  valACYclovir (VALTREX) 500 MG tablet TAKE 1 TABLET BY MOUTH EVERY DAY OR AS DIRECTED. 07/29/14  Yes Chelle Jeffery, PA-C  Vitamin D, Ergocalciferol, (DRISDOL) 50000 UNITS CAPS capsule TAKE 1 CAPSULE BY MOUTH ONCE A WEEK. 09/10/14  Yes  Barton Fanny, MD  albuterol (PROVENTIL HFA;VENTOLIN HFA) 108 (90 BASE) MCG/ACT inhaler Inhale 2 puffs into the lungs every 4 (four) hours as needed for wheezing or shortness of breath (cough, shortness of breath or wheezing.). Patient not taking: Reported on 12/03/2014 05/03/14   Posey Boyer, MD  amphetamine-dextroamphetamine (ADDERALL) 12.5 MG tablet Take 1 tablet by mouth 2 (two) times daily. Patient not taking: Reported on 12/17/2014 11/01/14   Shawnee Knapp, MD  amphetamine-dextroamphetamine (ADDERALL) 12.5 MG tablet Take 1 tablet by mouth 2 (two) times daily. Patient not taking: Reported on 12/17/2014 11/01/14   Shawnee Knapp, MD  amphetamine-dextroamphetamine (ADDERALL) 12.5 MG tablet Take 1 tablet by mouth 2 (two) times daily. Patient not taking: Reported on 12/17/2014 11/01/14   Shawnee Knapp, MD     ROS: The patient  denies fevers, chills, night sweats, unintentional weight loss, chest pain, palpitations, wheezing, dyspnea on exertion, nausea, vomiting, abdominal pain, dysuria, hematuria, melena, numbness, weakness, or tingling.   All other systems have been reviewed and were otherwise negative with the exception of those mentioned in the HPI and as above.    PHYSICAL EXAM: Filed Vitals:   12/17/14 1133  BP: 122/80  Pulse: 78  Temp: 99.5 F (37.5 C)  Resp: 16   Body mass index is 27.21 kg/(m^2).   General: Alert, no acute distress HEENT:  Normocephalic, atraumatic, oropharynx patent. EOMI, PERRLA,fundcoscopic exam nl Cardiovascular:  Regular rate and rhythm, no rubs murmurs or gallops.  No Carotid bruits, radial pulse intact. No pedal edema.  Respiratory: Clear to auscultation bilaterally.  No wheezes, rales, or rhonchi.  No cyanosis, no use of accessory musculature Abdominal: No organomegaly, abdomen is soft and non-tender, positive bowel sounds. No masses. Skin: No rashes. Neurologic: Facial musculature symmetric. Psychiatric: Patient acts appropriately throughout our interaction. Lymphatic: No cervical or submandibular lymphadenopathy Musculoskeletal: Gait intact. No edema, tenderness CN 2-12 grossly intact, was dizzy with EOMI Recall 3/3  NEg finger to nose, + romberg  LABS: Results for orders placed or performed in visit on 06/28/14  Vitamin D, 25-hydroxy  Result Value Ref Range   Vit D, 25-Hydroxy 36 30 - 100 ng/mL  LDL Cholesterol, Direct  Result Value Ref Range   Direct LDL 160 (H) mg/dL     EKG/XRAY:   Primary read interpreted by Dr. Marin Comment at [facility]. Avulsion fracture is healing well   ASSESSMENT/PLAN: Encounter Diagnoses  Name Primary?  . Thumb fracture, right, with routine healing, subsequent encounter Yes  . Vision changes   . Subacute confusional state   . Dizziness and giddiness    Suspect just concussion sxs but she does have some neuro deficitis 2 weeks out, she  will see if backing up on her work duties will help with sxs, if no improvement thenneed to get CT scan Will call insurance and get status of noncontrast head CT, will let me know and I can order it Wrist splint with thumb spica, aluminum splint prn since rubbing against skin Fu in 4 weeks or prn  Gross sideeffects, risk and benefits, and alternatives of medications d/w patient. Patient is aware that all medications have potential sideeffects and we are unable to predict every sideeffect or drug-drug interaction that may occur.  Jakyiah Briones DO  12/17/2014 12:29 PM

## 2014-12-24 ENCOUNTER — Telehealth: Payer: Self-pay

## 2014-12-24 DIAGNOSIS — R410 Disorientation, unspecified: Secondary | ICD-10-CM

## 2014-12-24 DIAGNOSIS — F05 Delirium due to known physiological condition: Principal | ICD-10-CM

## 2014-12-24 NOTE — Telephone Encounter (Signed)
Patient called to ask if the order for a CT scan had been put in after her OV on 12/17/14.  She said Dr Marin Comment wanted her to have a post-concussion CT scan. She had not heard anything from the facility she requested or from [place].  She wants to go to [facility] to have this done.  Please advise.  Thank you.

## 2014-12-24 NOTE — Telephone Encounter (Signed)
Dr.Le please advise.

## 2014-12-25 NOTE — Telephone Encounter (Signed)
Can we try to do this today or tomorrow if possible at [facility]. She was supposed to call me to let me know if her insurance approved but I guess she forgot. Anyhow today would be better but tomorrow ok too.

## 2014-12-25 NOTE — Telephone Encounter (Signed)
I called and spoke to Referrals to make sure they will look for the message Jonelle Sidle forwarded for this pt since Dr Marin Comment would like a procedure done today or tomorrow. Referrals advised they will work on it.

## 2014-12-25 NOTE — Telephone Encounter (Signed)
Referrals can you work on this today?

## 2014-12-27 ENCOUNTER — Telehealth: Payer: Self-pay

## 2014-12-27 NOTE — Telephone Encounter (Signed)
Appt with [place] [facility] Monday, She did not have any confusion over the phone. She has had cholesteatoma surgery x 3

## 2014-12-27 NOTE — Telephone Encounter (Signed)
Patient wanted [place] to inform Dr. Marin Comment that she has had three brain surgeries in the past, and she didn't know if that would affect her appt for the CT scan.  She called [employer] and found that the procedure is covered by her insurance at [employer], so she has made an appt with them.

## 2014-12-30 ENCOUNTER — Ambulatory Visit
Admission: RE | Admit: 2014-12-30 | Discharge: 2014-12-30 | Disposition: A | Discharge status: Home / Self Care | Payer: BC Managed Care – PPO | Source: Ambulatory Visit | Attending: Family Medicine | Admitting: Family Medicine

## 2014-12-30 DIAGNOSIS — R410 Disorientation, unspecified: Secondary | ICD-10-CM

## 2014-12-30 DIAGNOSIS — F05 Delirium due to known physiological condition: Principal | ICD-10-CM

## 2014-12-31 ENCOUNTER — Ambulatory Visit: Payer: BC Managed Care – PPO | Admitting: Cardiology

## 2014-12-31 ENCOUNTER — Telehealth: Payer: Self-pay | Admitting: Family Medicine

## 2014-12-31 NOTE — Telephone Encounter (Signed)
Spoke with patient about Ct scan

## 2015-02-07 ENCOUNTER — Encounter: Payer: Self-pay | Admitting: Cardiology

## 2015-02-07 ENCOUNTER — Ambulatory Visit (INDEPENDENT_AMBULATORY_CARE_PROVIDER_SITE_OTHER): Payer: BC Managed Care – PPO | Admitting: Cardiology

## 2015-02-07 VITALS — BP 128/86 | HR 78 | Ht 66.0 in | Wt 176.5 lb

## 2015-02-07 DIAGNOSIS — R0609 Other forms of dyspnea: Secondary | ICD-10-CM

## 2015-02-07 DIAGNOSIS — R002 Palpitations: Secondary | ICD-10-CM | POA: Diagnosis not present

## 2015-02-07 DIAGNOSIS — R600 Localized edema: Secondary | ICD-10-CM

## 2015-02-07 DIAGNOSIS — R0789 Other chest pain: Secondary | ICD-10-CM | POA: Diagnosis not present

## 2015-02-07 NOTE — Patient Instructions (Signed)
Recommend using support stocking  15- 20 mm hg  (during the  Today)  If you any symptoms during exercise,you may call the office - we can do a stress test.  No changes at current time.  Your physician recommends that you schedule a follow-up appointment as needed.

## 2015-02-07 NOTE — Progress Notes (Signed)
PATIENT: Catherine Cole MRN: [id number] DOB: November 01, 1955 PCP: [name], MD  Clinic Note: Chief Complaint  Patient presents with  . New Evaluation    Refer by Dr Brigitte Pulse, pt c/o of anixety attack  . Shortness of Breath    on exertion    HPI: Catherine Cole is a 59 y.o. female with a PMH below who presents today for evaluation of chest pain and exertional dyspnea.    Interval History: Very pleasant 47 you what-year-old woman referred here for some very atypical sounding chest discomfort episodes. Apparently, last December she had an episode of chest discomfort that really began as discomfort in her back associated with shortness of breath that was felt to be related to his anxiety attack. She was referred to see Dr. Einar Gip (cardiology). Apparently she was not happy with this clinic visit at Dr. Glennis Brink office. She felt scouted that nobody listened to her symptoms and was told that it was GERD related. This was despite her not mentioning any GERD symptoms .  There is recommendation of a treadmill stress test, but she declined to go forward with it. Apparently the consult was placed incorrectly for hypertension and not for chest pain.  Since that episode she is maybe had 2-3 episodes similar but not as severe -- most recent occurrence was in roughly June timeframe. During the episode she had she noted pain that went from her back radiating to her shoulders. It was associated with nausea, cold sweats and clamminess. It was not associated with any particular activity, and occurred really at rest. She notes that when she had the episode she was very intensely concerned and stressed. She declined having the treadmill stress test recommended by Dr. Einar Gip  because of financial concerns and because she did not think that he listened to her symptoms.  She describes some exertional dyspnea, but is not overall very physically active.  She does not mention that she's really had any more of these episodes  since the summertime. She is only on HCTZ for blood pressure and is on Adderall for ADD. She probably had a fall in August where she fractured her thumb and bruised her ribs. This was not at all associated with loss of consciousness, she simply lost her balance while hanging a shower curtain.  She does note intermittent lower extremity swelling and has some mild little discoloration spots and spider veins. These are occasionally uncomfortable but nothing worrisome to her because the swelling goes down at night.  Cardiovascular ROS: positive for - Atypical sharp chest pain that is rarely occurring. Mild exertional dyspnea, not limiting. Rare palpitations, but not long-lasting. Mild lower extremity edema with spider veins negative for - loss of consciousness, orthopnea, paroxysmal nocturnal dyspnea, rapid heart rate or Syncope/near syncope, TIA/amaurosis fugax :  Past Medical History  Diagnosis Date  . Depression   . Anxiety   . Anemia   . IBS (irritable bowel syndrome)     Prior Cardiac Evaluation and Past Surgical History: Past Surgical History  Procedure Laterality Date  . Appendectomy      12 yrs  . Cholecystectomy  1995  . Fracture surgery    . Tonsillectomy      15 yrs  . Cholesteatoma excision      13 and 22 yrs  . Kidney stone surgery  1995    Allergies  Allergen Reactions  . Codeine   . Demerol Nausea And Vomiting  . Erythromycin Itching and Rash  . Neosporin [Neomycin-Polymyxin B Gu]  Itching and Rash    Current Outpatient Prescriptions  Medication Sig Dispense Refill  . albuterol (PROVENTIL HFA;VENTOLIN HFA) 108 (90 BASE) MCG/ACT inhaler Inhale 2 puffs into the lungs every 4 (four) hours as needed for wheezing or shortness of breath (cough, shortness of breath or wheezing.). 1 Inhaler 1  . amphetamine-dextroamphetamine (ADDERALL) 12.5 MG tablet Take 1 tablet by mouth 2 (two) times daily with a meal. 60 tablet 0  . escitalopram (LEXAPRO) 10 MG tablet TAKE 3 TABLETS  (30 MG) ONCE A DAY. 90 tablet 4  . hydrochlorothiazide (HYDRODIURIL) 25 MG tablet Take 1 tablet (25 mg total) by mouth daily. 90 tablet 1  . naproxen (NAPROSYN) 500 MG tablet Take 1 tablet (500 mg total) by mouth 2 (two) times daily with a meal. No other NSAIDs, take with food. 30 tablet 0  . tizanidine (ZANAFLEX) 2 MG capsule Take 1 capsule (2 mg total) by mouth 3 (three) times daily as needed for muscle spasms. 30 capsule 0  . valACYclovir (VALTREX) 500 MG tablet TAKE 1 TABLET BY MOUTH EVERY DAY OR AS DIRECTED. 30 tablet 0   No current facility-administered medications for this visit.    Social History   Social History Narrative    family history includes Alcohol abuse in her father; Diabetes in her father; Heart disease (age of onset: 63) in her mother.  ROS: A comprehensive Review of Systems - was performed Review of Systems  Constitutional: Negative for malaise/fatigue and diaphoresis.  HENT: Positive for nosebleeds.   Gastrointestinal: Positive for melena. Negative for blood in stool.  Genitourinary: Negative for hematuria.  Neurological: Positive for dizziness (Sometimes with change of position). Negative for weakness and headaches.  Endo/Heme/Allergies: Does not bruise/bleed easily.  Psychiatric/Behavioral: Positive for depression. The patient is nervous/anxious and has insomnia.   All other systems reviewed and are negative.   PHYSICAL EXAM BP 128/86 mmHg  Pulse 78  Ht 5\' 6"  (1.676 m)  Wt 176 lb 8 oz (80.06 kg)  BMI 28.50 kg/m2 General appearance: alert, cooperative, appears stated age, no distress and very talkative. well nourished & well groomed. Neck: no adenopathy, no carotid bruit, no JVD and supple, symmetrical, trachea midline Lungs: clear to auscultation bilaterally, normal percussion bilaterally and good air movement.  Non-labored Heart: regular rate and rhythm, S1, S2 normal, no murmur, click, rub or gallop and normal apical impulse Abdomen: soft, non-tender;  bowel sounds normal; no masses,  no organomegaly Extremities: extremities normal, atraumatic, no cyanosis or edema Pulses: 2+ and symmetric Skin: Skin color, texture, turgor normal. No rashes or lesions or Mild punctate lesions associated with small spider veins on both lower legs. Neurologic: Alert and oriented X 3, normal strength and tone. Normal symmetric reflexes. Normal coordination and gait Mental status: Alert, oriented, thought content appropriate, Somewhat anxious appearing sometimes has flight of ideas. Tends to digressed from main topic  Cranial nerves: normal   Adult ECG Report  Rate: 78 ;  Rhythm: normal sinus rhythm and Poor anterior R-wave progression --> cannot exclude in anterior infarct, age undetermined.  QRS Axis: -26 (leftward) ;  PR Interval: 154 ;  QRS Duration: 90 ; QTc: 449; Voltages: Borderline low  Narrative Interpretation: Mildly abnormal EKG but no acute findings.  Recent Labs: No labs available  ASSESSMENT / PLAN: Very pleasant woman with clear anxiety issues. She has intermittent episodes that really sound like panic attacks by her description. They're associated with a sense of doom and sense of closing in on her. These are relatively  rare. She has not had any exertional chest discomfort. She has some mild exertional dyspnea but this is probably related to deconditioning. We talked about potential evaluation. Her EKG is relatively normal. Poor R-wave progression is not a very significant finding in not usually indicative of anything in a relatively symmetric patient. We talked about possibly doing treadmill stress test or even an echocardiogram, but in the absence of active recurrent anginal/chest pain symptoms, we decided that this was not necessary.  My recommendation would be to monitor her I gave her reassurance that this is probably mostly stress related and not cardiac in nature. I wanted her to pay attention to her symptoms and if she has any worsening  exertional dyspnea or any chest discomfort associated with exertion, to please contact our office to be reevaluated and consider a stress test at that time.  Defer management of her stress/anxiety and ADHD to her primary physician.   Problem List Items Addressed This Visit    Palpitations    Rare, not overly worrisome. Simply noted when she becomes somewhat stressed. I'm reluctant to really treat these with anything besides reassurance. She is on Adderall and I'm not sure of the interaction Adderall and beta blockers. If she were to notice rapid palpitations, would consider calcium channel blocker.      Relevant Orders   EKG 12-Lead   Lower extremity edema (Chronic)    For small amount of edema and spider veins, I recommended that she wear support stockings during the day since she is up on her feet quite a bit of time during the day.      Exertional dyspnea (Chronic)    We talked about this is probably deconditioning related since it is not a new thing for her. Recommended exercise reassessing. If she does have worsening dyspnea or chest discomfort with exertion, would then consider stress testing with plus or minus echocardiogram      Relevant Orders   EKG 12-Lead   Atypical chest pain - Primary    Likely not anginal in nature. Not frequent enough to consider this to be an anginal equivalent. Not necessarily associated with exertion. Atypical features could potentially be musculoskeletal or simply resulting from stress. It is mostly actually back pain not chest pain.        No orders of the defined types were placed in this encounter.    Recommend using support stocking  15- 20 mm hg  (during the  Today)  If you any symptoms during exercise,you may call the office - we can do a stress test.  No changes at current time.  Your physician recommends that you schedule a follow-up appointment as needed.   Leonie Man, M.D., M.S. Interventional Cardiologist   Pager #  [phone]

## 2015-02-08 ENCOUNTER — Encounter: Payer: Self-pay | Admitting: Cardiology

## 2015-02-08 DIAGNOSIS — R002 Palpitations: Secondary | ICD-10-CM | POA: Insufficient documentation

## 2015-02-08 DIAGNOSIS — R600 Localized edema: Secondary | ICD-10-CM | POA: Insufficient documentation

## 2015-02-08 DIAGNOSIS — R0609 Other forms of dyspnea: Secondary | ICD-10-CM

## 2015-02-08 DIAGNOSIS — R0789 Other chest pain: Secondary | ICD-10-CM | POA: Insufficient documentation

## 2015-02-08 NOTE — Assessment & Plan Note (Signed)
We talked about this is probably deconditioning related since it is not a new thing for her. Recommended exercise reassessing. If she does have worsening dyspnea or chest discomfort with exertion, would then consider stress testing with plus or minus echocardiogram

## 2015-02-08 NOTE — Assessment & Plan Note (Signed)
For small amount of edema and spider veins, I recommended that she wear support stockings during the day since she is up on her feet quite a bit of time during the day.

## 2015-02-08 NOTE — Assessment & Plan Note (Signed)
Rare, not overly worrisome. Simply noted when she becomes somewhat stressed. I'm reluctant to really treat these with anything besides reassurance. She is on Adderall and I'm not sure of the interaction Adderall and beta blockers. If she were to notice rapid palpitations, would consider calcium channel blocker.

## 2015-02-08 NOTE — Assessment & Plan Note (Signed)
Likely not anginal in nature. Not frequent enough to consider this to be an anginal equivalent. Not necessarily associated with exertion. Atypical features could potentially be musculoskeletal or simply resulting from stress. It is mostly actually back pain not chest pain.

## 2015-02-20 ENCOUNTER — Ambulatory Visit: Payer: BC Managed Care – PPO | Admitting: Family Medicine

## 2015-02-21 ENCOUNTER — Ambulatory Visit: Payer: BC Managed Care – PPO | Admitting: Family Medicine

## 2015-03-27 ENCOUNTER — Ambulatory Visit: Payer: BC Managed Care – PPO | Admitting: Family Medicine

## 2015-04-07 ENCOUNTER — Telehealth: Payer: Self-pay

## 2015-04-07 NOTE — Telephone Encounter (Signed)
Had to cancel last appointment due to IBS.   Is rescheduling now for future appointmenet  Needs refill on amphetamine-dextroamphetamine (ADDERALL) 12.5 MG tablet   [phone]

## 2015-04-07 NOTE — Telephone Encounter (Signed)
Will refill on wed when i am in the office

## 2015-04-08 NOTE — Telephone Encounter (Signed)
Patient said she is completely out of her Adderrall. She is worried that she will not be ok at work tomorrow without her meds. I relayed Dr. Raul Del message to her.

## 2015-04-09 MED ORDER — AMPHETAMINE-DEXTROAMPHETAMINE 12.5 MG PO TABS: 12.5000 mg | ORAL_TABLET | Freq: Two times a day (BID) | ORAL | Status: DC

## 2015-04-09 NOTE — Telephone Encounter (Signed)
Refill printed and ready for pick up

## 2015-04-10 NOTE — Telephone Encounter (Signed)
Called pt and advised RX ready for pick up  

## 2015-05-01 ENCOUNTER — Telehealth: Payer: Self-pay

## 2015-05-01 ENCOUNTER — Ambulatory Visit (INDEPENDENT_AMBULATORY_CARE_PROVIDER_SITE_OTHER): Payer: BC Managed Care – PPO | Admitting: Family Medicine

## 2015-05-01 ENCOUNTER — Encounter: Payer: Self-pay | Admitting: Family Medicine

## 2015-05-01 VITALS — BP 140/85 | HR 93 | Temp 98.6°F | Resp 16 | Ht 66.0 in | Wt 178.0 lb

## 2015-05-01 DIAGNOSIS — F902 Attention-deficit hyperactivity disorder, combined type: Secondary | ICD-10-CM

## 2015-05-01 DIAGNOSIS — N920 Excessive and frequent menstruation with regular cycle: Secondary | ICD-10-CM | POA: Diagnosis not present

## 2015-05-01 DIAGNOSIS — Z9889 Other specified postprocedural states: Secondary | ICD-10-CM | POA: Diagnosis not present

## 2015-05-01 DIAGNOSIS — I10 Essential (primary) hypertension: Secondary | ICD-10-CM

## 2015-05-01 DIAGNOSIS — E782 Mixed hyperlipidemia: Secondary | ICD-10-CM | POA: Diagnosis not present

## 2015-05-01 DIAGNOSIS — K589 Irritable bowel syndrome without diarrhea: Secondary | ICD-10-CM | POA: Diagnosis not present

## 2015-05-01 DIAGNOSIS — Z87442 Personal history of urinary calculi: Secondary | ICD-10-CM | POA: Diagnosis not present

## 2015-05-01 DIAGNOSIS — L723 Sebaceous cyst: Secondary | ICD-10-CM

## 2015-05-01 DIAGNOSIS — N3001 Acute cystitis with hematuria: Secondary | ICD-10-CM

## 2015-05-01 DIAGNOSIS — R319 Hematuria, unspecified: Secondary | ICD-10-CM

## 2015-05-01 DIAGNOSIS — R3915 Urgency of urination: Secondary | ICD-10-CM

## 2015-05-01 DIAGNOSIS — N200 Calculus of kidney: Secondary | ICD-10-CM

## 2015-05-01 LAB — POCT URINALYSIS DIP (MANUAL ENTRY)
BILIRUBIN UA: NEGATIVE
BILIRUBIN UA: NEGATIVE
GLUCOSE UA: NEGATIVE
NITRITE UA: NEGATIVE
Protein Ur, POC: NEGATIVE
Spec Grav, UA: 1.02
Urobilinogen, UA: 0.2
pH, UA: 5.5

## 2015-05-01 MED ORDER — MELATONIN 5 MG PO TABS: 1.0000 | ORAL_TABLET | Freq: Every day | ORAL | Status: DC

## 2015-05-01 MED ORDER — ESCITALOPRAM OXALATE 10 MG PO TABS: ORAL_TABLET | ORAL | Status: DC

## 2015-05-01 MED ORDER — DIPHENOXYLATE-ATROPINE 2.5-0.025 MG PO TABS
1.0000 | ORAL_TABLET | Freq: Four times a day (QID) | ORAL | Status: DC | PRN
Start: 2015-05-01 — End: 2015-09-18

## 2015-05-01 MED ORDER — HYDROCHLOROTHIAZIDE 25 MG PO TABS: 25.0000 mg | ORAL_TABLET | Freq: Every day | ORAL | Status: DC

## 2015-05-01 MED ORDER — DEPEND EASY FIT UNDERGARMENTS MISC: Status: DC

## 2015-05-01 MED ORDER — AMPHETAMINE-DEXTROAMPHET ER 30 MG PO CP24: 30.0000 mg | ORAL_CAPSULE | Freq: Every day | ORAL | Status: DC

## 2015-05-01 NOTE — Telephone Encounter (Signed)
Pt saw dr Brigitte Pulse today and saw a little blood in her urine and is supposed to give blood in the morning and wants to know if she should go ahead with that   Please call [phone]

## 2015-05-01 NOTE — Telephone Encounter (Signed)
You are correct, pt does not have any sign of bacteria in her blood or in her urine and it is completely fine for her to give blood. Drink lots of water.

## 2015-05-01 NOTE — Patient Instructions (Addendum)
[address], [place], [place] [address]  [phone]  [place]  Address: [address], [place], [place] [address]  [phone]  [facility] Address: [address] [place], [place] [address]  Phone:(336) [place], [id number] Phone: [place], [place], [place] [address], [place], [place] [address] Phone: [place] [address] [place], [place], [place] [address]  Phone: [phone]  Look into the [place]. Consider trying the product IBGuard.  Diet for Irritable Bowel Syndrome When you have irritable bowel syndrome (IBS), the foods you eat and your eating habits are very important. IBS may cause various symptoms, such as abdominal pain, constipation, or diarrhea. Choosing the right foods can help ease discomfort caused by these symptoms. Work with your health care provider and dietitian to find the best eating plan to help control your symptoms. WHAT GENERAL GUIDELINES DO I NEED TO FOLLOW?  Keep a food diary. This will help you identify foods that cause symptoms. Write down:  What you eat and when.  What symptoms you have.  When symptoms occur in relation to your meals.  Avoid foods that cause symptoms. Talk with your dietitian about other ways to get the same nutrients that are in these foods.  Eat more foods that contain fiber. Take a fiber supplement if directed by your dietitian.  Eat your meals slowly, in a relaxed setting.  Aim to eat 5-6 small meals per day. Do not skip meals.  Drink enough fluids to keep your urine clear or pale yellow.  Ask your health care provider if you should take an over-the-counter probiotic during flare-ups to help restore healthy gut bacteria.  If you have cramping or diarrhea, try making your  meals low in fat and high in carbohydrates. Examples of carbohydrates are pasta, rice, whole grain breads and cereals, fruits, and vegetables.  If dairy products cause your symptoms to flare up, try eating less of them. You might be able to handle yogurt better than other dairy products because it contains bacteria that help with digestion. WHAT FOODS ARE NOT RECOMMENDED? The following are some foods and drinks that may worsen your symptoms:  Fatty foods, such as [place] fries.  Milk products, such as cheese or ice cream.  Chocolate.  Alcohol.  Products with caffeine, such as coffee.  Carbonated drinks, such as soda. The items listed above may not be a complete list of foods and beverages to avoid. Contact your dietitian for more information. WHAT FOODS ARE GOOD SOURCES OF FIBER? Your health care provider or dietitian may recommend that you eat more foods that contain fiber. Fiber can help reduce constipation and other IBS symptoms. Add foods with fiber to your diet a little at a time so that your body can get used to them. Too much fiber at once might cause gas and swelling of your abdomen. The following are some foods that are good sources of fiber:  Apples.  Peaches.  Pears.  Berries.  Figs.  Broccoli (raw).  Cabbage.  Carrots.  Raw peas.  Kidney beans.  Lima beans.  Whole grain bread.  Whole grain cereal. FOR MORE INFORMATION  International Foundation for Functional Gastrointestinal Disorders: www.iffgd.[employer] and Digestive [employer] Kidney [employer]: NetworkAffair.co.za.aspx   This information is not intended to replace advice given to you by your health care  provider. Make sure you discuss any questions you have with your health care provider.   Document Released: 06/26/2003 Document Revised: 04/26/2014 Document Reviewed: 07/06/2013 Elsevier Interactive Patient Education  [employer].

## 2015-05-01 NOTE — Progress Notes (Addendum)
Subjective:    Patient ID: Catherine Cole, female    DOB: 1955/10/10, 60 y.o.   MRN: [id number] Chief Complaint  Patient presents with  . Medication Refill  . Urinary Frequency    HPI  I last saw pt 6 mos prior. HPL:  Direct LDL 160 10 mos prior - advsed to work on tlc. Vit D def: was improved to 36 when last checked 28mos ago and advised to start on daily supp. ADD: had been on 10 bid for years which we increased to 12.5 bid at last visit.  She reports she has not seen any benefit from this and would like to increase this further.  For a while she misunderstood the instructions and was taking 20mg  IR qd (rather than 10 bid) in the morning.  She gets very down on herself and then gets overwhelmed and depressed.  She has not been feeling well, not sleeping. She has not been keeping structure in her life.  She wonders if this could be due to her concussion. [place] and her sisters walk a lot so she knows she should - would like to increase her lexapro but is on 30mg  - she was on prozac, lexapro, and wellbutrin for years from a psychiatrist but then got lexapro "burnout" so the stimulant was added in. She is not sleeping.  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  Having to wear depends due to some fecal incontinence. She does not use any anti-diarrheals. She requests a prescription for depends.  She c/o urinary freq, pressure, urgency.  She needs to have something cut off her nose - the sebaceous cyst lining removed - does not have a dermatologist - she would like a referral to  one  Saw cardiology Dr. Ellyn Hack for atypical CP 10 wks prior. Has a  BP cuff at home - not checked lately but will restart  Past Medical History  Diagnosis Date  . Depression   . Anxiety   . Anemia   . IBS (irritable bowel syndrome)    Past Surgical History  Procedure Laterality Date  . Appendectomy      12 yrs  . Cholecystectomy  1995  . Fracture surgery    . Tonsillectomy     15 yrs  . Cholesteatoma excision      13 and 22 yrs  . Kidney stone surgery  1995   Current Outpatient Prescriptions on File Prior to Visit  Medication Sig Dispense Refill  . valACYclovir (VALTREX) 500 MG tablet TAKE 1 TABLET BY MOUTH EVERY DAY OR AS DIRECTED. 30 tablet 0   No current facility-administered medications on file prior to visit.   Allergies  Allergen Reactions  . Codeine   . Demerol Nausea And Vomiting  . Erythromycin Itching and Rash  . Neosporin [Neomycin-Polymyxin B Gu] Itching and Rash   Family History  Problem Relation Age of Onset  . Heart disease Mother 78    Heart attack  . Alcohol abuse Father   . Diabetes Father    Social History   Social History  . Marital Status: Widowed    Spouse Name: N/A  . Number of Children: N/A  . Years of Education: N/A   Social History Main Topics  . Smoking status: Former Research scientist (life sciences)  . Smokeless tobacco: None  . Alcohol Use: No  . Drug Use: No  . Sexual Activity: No   Other Topics Concern  . None   Social History Narrative     Review  of Systems  Constitutional: Positive for diaphoresis and fatigue. Negative for fever, chills, activity change, appetite change and unexpected weight change.  Respiratory: Negative for chest tightness.   Cardiovascular: Negative for chest pain and palpitations.  Gastrointestinal: Positive for abdominal pain and diarrhea. Negative for nausea, vomiting and constipation.  Genitourinary: Positive for urgency, frequency and pelvic pain. Negative for dysuria, hematuria, flank pain, decreased urine volume and difficulty urinating.  Skin: Positive for color change.  Psychiatric/Behavioral: Positive for behavioral problems, sleep disturbance, dysphoric mood, decreased concentration and agitation. Negative for confusion and self-injury. The patient is nervous/anxious. The patient is not hyperactive.        Objective:  BP 157/82 mmHg  Pulse 93  Temp(Src) 98.6 F (37 C)  Resp 16  Ht 5\' 6"   (1.676 m)  Wt 178 lb (80.74 kg)  BMI 28.74 kg/m2  Physical Exam  Constitutional: She is oriented to person, place, and time. She appears well-developed and well-nourished. No distress.  HENT:  Head: Normocephalic and atraumatic.  Right Ear: External ear normal.  Left Ear: External ear normal.  Eyes: Conjunctivae are normal. No scleral icterus.  Neck: Normal range of motion. Neck supple. No thyromegaly present.  Cardiovascular: Normal rate, regular rhythm, normal heart sounds and intact distal pulses.   Pulmonary/Chest: Effort normal and breath sounds normal. No respiratory distress.  Musculoskeletal: She exhibits no edema.  Lymphadenopathy:    She has no cervical adenopathy.  Neurological: She is alert and oriented to person, place, and time.  Skin: Skin is warm and dry. She is not diaphoretic. No erythema.  Psychiatric: She has a normal mood and affect. Her behavior is normal.          Results for orders placed or performed in visit on 05/01/15  Urine culture  Result Value Ref Range   Colony Count >=100,000 COLONIES/ML    Preliminary Report ESCHERICHIA COLI   POCT urinalysis dipstick  Result Value Ref Range   Color, UA yellow yellow   Clarity, UA clear clear   Glucose, UA negative negative   Bilirubin, UA negative negative   Ketones, POC UA negative negative   Spec Grav, UA 1.020    Blood, UA moderate (A) negative   pH, UA 5.5    Protein Ur, POC negative negative   Urobilinogen, UA 0.2    Nitrite, UA Negative Negative   Leukocytes, UA small (1+) (A) Negative    Assessment & Plan:   Over 40 min spent in face-to-face evaluation of and consultation with patient and coordination of care.  Over 50% of this time was spent counseling this patient.  1. Essential hypertension, benign - likely worsening with increasing stimulant use - hopefully less elevation on XR stimulant  2. Mixed dyslipidemia -  Needs flp at f/u  3. Attention deficit hyperactivity disorder (ADHD),  combined type - Needs to get psych c/s - contact info given. Due to wide variety of some conflicting sxs, I suspect she is going to require mult medications and pt states she also usu needs quite high doses.  For now will try switching Adderall IR 12.5 bid to Adderall XR 30mg  qd.  Call in several wks to let me know how she is dong on this change before refilling. She has not tried any other stimulant - could try ritalin?  After establishing with psych, she will need to get any stimulant therapy from there. I think she will likely need to wean off lexapro 30 to try another ssri - consider prozac instead  she she did do ok on that prior. Wellbutrin may help with her concentration, fatigue, and anhedonia but concern it will exacerbate her anxiety and insomnia. Reluctant to use any sedative-hypnotic meds for sleep since on stimulant therapy and could cause a.m. Hangover - could try TCA/trazodone but on such a high dose of lexapro currently would need to wean down before adding in med that could contribute to serotonin syndrome.  Pt reports to much morning grogginess with benadryl so she will try melatonin instead  4. Urinary urgency   5. Excessive or frequent menstruation   6. 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  req rx for depends pads which she needs due to fecal incontinence.  7. Sebaceous cyst  - refer to derm for removal as large blue hued mass on bridge of nose  8. Hematuria - due to UTI - recheck at next routine OV  9. H/O nephrolithotomy with removal of calculi   10.  UTI  - Bactrim DS bid x 5d  F/u 4 mos  Orders Placed This Encounter  Procedures  . Urine culture  . Ambulatory referral to Dermatology    Referral Priority:  Routine    Referral Type:  Consultation    Referral Reason:  Specialty Services Required    Requested Specialty:  Dermatology    Number of Visits  Requested:  1  . POCT urinalysis dipstick  . POCT Microscopic Urinalysis ([facility])    Meds ordered this encounter  Medications  . amphetamine-dextroamphetamine (ADDERALL XR) 30 MG 24 hr capsule    Sig: Take 1 capsule (30 mg total) by mouth daily.    Dispense:  30 capsule    Refill:  0  . diphenoxylate-atropine (LOMOTIL) 2.5-0.025 MG tablet    Sig: Take 1 tablet by mouth 4 (four) times daily as needed for diarrhea or loose stools.    Dispense:  30 tablet    Refill:  0  . escitalopram (LEXAPRO) 10 MG tablet    Sig: TAKE 3 TABLETS (30 MG) ONCE A DAY.    Dispense:  90 tablet    Refill:  4  . hydrochlorothiazide (HYDRODIURIL) 25 MG tablet    Sig: Take 1 tablet (25 mg total) by mouth daily.    Dispense:  90 tablet    Refill:  1  . DISCONTD: Incontinence Supply Disposable (DEPEND EASY FIT UNDERGARMENTS) MISC    Sig: Use daily as directed. Change several times daily as needed.    Dispense:  120 each    Refill:  99  . DISCONTD: Melatonin 5 MG TABS    Sig: Take 1 tablet (5 mg total) by mouth at bedtime.    Dispense:  90 tablet    Refill:  3  . Incontinence Supply Disposable (DEPEND EASY FIT UNDERGARMENTS) MISC    Sig: Use daily as directed. Change several times daily as needed.    Dispense:  120 each    Refill:  99  . Melatonin 5 MG TABS    Sig: Take 1 tablet (5 mg total) by mouth at bedtime.    Dispense:  90 tablet    Refill:  3     [name], MD MPH

## 2015-05-01 NOTE — Telephone Encounter (Signed)
SPoke with pt, she wants to know if it is ok to give blood at a blood draw tomorrow at work? She didn't know if she had bacteria in her blood, please advise. I feel it still should be ok but need approval. Thanks.

## 2015-05-02 DIAGNOSIS — N200 Calculus of kidney: Secondary | ICD-10-CM | POA: Insufficient documentation

## 2015-05-02 MED ORDER — SULFAMETHOXAZOLE-TRIMETHOPRIM 800-160 MG PO TABS: 1.0000 | ORAL_TABLET | Freq: Two times a day (BID) | ORAL | Status: DC

## 2015-05-02 NOTE — Addendum Note (Signed)
Addended by: [name] on: 05/02/2015 11:50 PM   Modules accepted: Orders, SmartSet

## 2015-05-02 NOTE — Telephone Encounter (Signed)
Pt advised.

## 2015-05-03 LAB — URINE CULTURE: Colony Count: >=100000

## 2015-05-05 ENCOUNTER — Ambulatory Visit (INDEPENDENT_AMBULATORY_CARE_PROVIDER_SITE_OTHER): Payer: BC Managed Care – PPO

## 2015-05-05 ENCOUNTER — Ambulatory Visit (INDEPENDENT_AMBULATORY_CARE_PROVIDER_SITE_OTHER): Payer: BC Managed Care – PPO | Admitting: Family Medicine

## 2015-05-05 VITALS — BP 124/82 | HR 92 | Temp 98.4°F | Resp 18 | Ht 66.0 in | Wt 179.0 lb

## 2015-05-05 DIAGNOSIS — N200 Calculus of kidney: Secondary | ICD-10-CM | POA: Diagnosis not present

## 2015-05-05 DIAGNOSIS — Z87442 Personal history of urinary calculi: Secondary | ICD-10-CM

## 2015-05-05 DIAGNOSIS — R319 Hematuria, unspecified: Secondary | ICD-10-CM | POA: Diagnosis not present

## 2015-05-05 DIAGNOSIS — N3001 Acute cystitis with hematuria: Secondary | ICD-10-CM | POA: Diagnosis not present

## 2015-05-05 DIAGNOSIS — L723 Sebaceous cyst: Secondary | ICD-10-CM | POA: Diagnosis not present

## 2015-05-05 DIAGNOSIS — Z9889 Other specified postprocedural states: Secondary | ICD-10-CM

## 2015-05-05 LAB — POCT CBC
Granulocyte percent: 72 %G (ref 37–80)
HCT, POC: 39 % (ref 37.7–47.9)
HEMOGLOBIN: 13.4 g/dL (ref 12.2–16.2)
LYMPH, POC: 2.3 (ref 0.6–3.4)
MCH, POC: 28.8 pg (ref 27–31.2)
MCHC: 34.4 g/dL (ref 31.8–35.4)
MCV: 83.8 fL (ref 80–97)
MID (CBC): 0.4 (ref 0–0.9)
MPV: 7.7 fL (ref 0–99.8)
POC Granulocyte: 6.8 (ref 2–6.9)
POC LYMPH PERCENT: 24.3 %L (ref 10–50)
POC MID %: 3.7 %M (ref 0–12)
Platelet Count, POC: 253 10*3/uL (ref 142–424)
RBC: 4.65 M/uL (ref 4.04–5.48)
RDW, POC: 12.8 %
WBC: 9.5 10*3/uL (ref 4.6–10.2)

## 2015-05-05 LAB — BASIC METABOLIC PANEL
BUN: 18 mg/dL (ref 7–25)
CHLORIDE: 96 mmol/L — AB (ref 98–110)
CO2: 32 mmol/L — AB (ref 20–31)
Calcium: 9.6 mg/dL (ref 8.6–10.4)
Creat: 1.06 mg/dL — ABNORMAL HIGH (ref 0.50–1.05)
Glucose, Bld: 102 mg/dL — ABNORMAL HIGH (ref 65–99)
Potassium: 3.5 mmol/L (ref 3.5–5.3)
Sodium: 136 mmol/L (ref 135–146)

## 2015-05-05 MED ORDER — CIPROFLOXACIN HCL 500 MG PO TABS: 500.0000 mg | ORAL_TABLET | Freq: Two times a day (BID) | ORAL | Status: DC

## 2015-05-05 MED ORDER — CEFTRIAXONE SODIUM 1 G IJ SOLR
1.0000 g | Freq: Once | INTRAMUSCULAR | Status: AC
  Administered 2015-05-05: 1 g via INTRAMUSCULAR

## 2015-05-05 NOTE — Addendum Note (Signed)
Addended by: [name] on: 05/05/2015 01:52 PM   Modules accepted: Orders, Medications, SmartSet

## 2015-05-05 NOTE — Progress Notes (Addendum)
Subjective:    Patient ID: Catherine Cole, female    DOB: 01/03/1956, 60 y.o.   MRN: [id number] By signing my name below, I, Zola Button, attest that this documentation has been prepared under the direction and in the presence of [name], MD.  Electronically Signed: Zola Button, Medical Scribe. 05/05/2015. 4:59 PM.  Chief Complaint  Patient presents with  . Follow-up  . Hematuria  . Depression    see screen    HPI HPI Comments: Catherine Cole is a 60 y.o. female with a history of IBS and kidney stone who presents to the [facility] for a follow-up. She had isolated hematuria at the time of her visit. With a history of kidney stones so I recommended that she drink lots of water and come back for a repeat UA and if persistent, can do imaging for kidney stones. However, during the interim, her urine culture was positive for E.Coli. I initially prescribed Bactrim which it was resistant to, so she was switched to Cipro today.  Patient had tried the Lomotil I prescribed last week which helped the diarrhea, but it seemed to make her abdominal cramping worse. She has never tried Bentyl or Levsin for cramping. She also reports having some fatigue. She is inquiring about a B12 shot for her fatigue. She would like to proceed with imaging to check for kidney stones.  Past Medical History  Diagnosis Date  . Depression   . Anxiety   . Anemia   . IBS (irritable bowel syndrome)    Past Surgical History  Procedure Laterality Date  . Appendectomy      12 yrs  . Cholecystectomy  1995  . Fracture surgery    . Tonsillectomy      15 yrs  . Cholesteatoma excision      13 and 22 yrs  . Kidney stone surgery  1995   Current Outpatient Prescriptions on File Prior to Visit  Medication Sig Dispense Refill  . amphetamine-dextroamphetamine (ADDERALL XR) 30 MG 24 hr capsule Take 1 capsule (30 mg total) by mouth daily. 30 capsule 0  . diphenoxylate-atropine (LOMOTIL) 2.5-0.025 MG  tablet Take 1 tablet by mouth 4 (four) times daily as needed for diarrhea or loose stools. 30 tablet 0  . escitalopram (LEXAPRO) 10 MG tablet TAKE 3 TABLETS (30 MG) ONCE A DAY. 90 tablet 4  . hydrochlorothiazide (HYDRODIURIL) 25 MG tablet Take 1 tablet (25 mg total) by mouth daily. 90 tablet 1  . Incontinence Supply Disposable (DEPEND EASY FIT UNDERGARMENTS) MISC Use daily as directed. Change several times daily as needed. 120 each 99  . Melatonin 5 MG TABS Take 1 tablet (5 mg total) by mouth at bedtime. 90 tablet 3  . valACYclovir (VALTREX) 500 MG tablet TAKE 1 TABLET BY MOUTH EVERY DAY OR AS DIRECTED. 30 tablet 0  . ciprofloxacin (CIPRO) 500 MG tablet Take 1 tablet (500 mg total) by mouth 2 (two) times daily. (Patient not taking: Reported on 05/05/2015) 14 tablet 0   No current facility-administered medications on file prior to visit.   Allergies  Allergen Reactions  . Codeine   . Demerol Nausea And Vomiting  . Erythromycin Itching and Rash  . Neosporin [Neomycin-Polymyxin B Gu] Itching and Rash   Family History  Problem Relation Age of Onset  . Heart disease Mother 20    Heart attack  . Alcohol abuse Father   . Diabetes Father    Social History   Social History  .  Marital Status: Widowed    Spouse Name: N/A  . Number of Children: N/A  . Years of Education: N/A   Social History Main Topics  . Smoking status: Former Research scientist (life sciences)  . Smokeless tobacco: None  . Alcohol Use: No  . Drug Use: No  . Sexual Activity: No   Other Topics Concern  . None   Social History Narrative     Review of Systems  Constitutional: Positive for fatigue. Negative for fever, chills, activity change, appetite change and unexpected weight change.  Gastrointestinal: Positive for abdominal pain, diarrhea and constipation. Negative for nausea, vomiting, blood in stool, abdominal distention and anal bleeding.  Endocrine: Negative for polydipsia, polyphagia and polyuria.  Genitourinary: Positive for flank  pain and pelvic pain. Negative for dysuria, urgency, hematuria, vaginal discharge, difficulty urinating and genital sores.       Objective:  BP 124/82 mmHg  Pulse 92  Temp(Src) 98.4 F (36.9 C) (Oral)  Resp 18  Ht 5\' 6"  (1.676 m)  Wt 179 lb (81.194 kg)  BMI 28.91 kg/m2  SpO2 98%  Physical Exam  Constitutional: She is oriented to person, place, and time. She appears well-developed and well-nourished. No distress.  HENT:  Head: Normocephalic and atraumatic.  Mouth/Throat: Oropharynx is clear and moist. No oropharyngeal exudate.  Eyes: Pupils are equal, round, and reactive to light.  Neck: Neck supple.  Cardiovascular: Normal rate.   Pulmonary/Chest: Effort normal.  Musculoskeletal: She exhibits no edema.  Neurological: She is alert and oriented to person, place, and time. No cranial nerve deficit.  Skin: Skin is warm and dry. No rash noted.  3 cm area of erythema under her left axilla with central 1 cm of fluctuance, raised nodule.  Psychiatric: She has a normal mood and affect. Her behavior is normal.  Nursing note and vitals reviewed.  [facility] (PRIMARY) x-ray report read by Dr. Brigitte Pulse: Abdomen - No acute abnormality. No stone seen. Dg Abd 1 View  05/05/2015  CLINICAL DATA:  Abdominal pain.  History of kidney stones. EXAM: ABDOMEN - 1 VIEW COMPARISON:  None. FINDINGS: The bowel gas pattern is normal. No findings for obstruction or perforation. The soft tissue shadows are maintained. Right renal calculi are noted. There is a calcification near the right transverse process of L3 which could be a ureteral calculus. Possible dilated right ureter above this. No pelvic calcifications. The bony structures are intact. IMPRESSION: Right renal calculi. Possible 6 mm right upper ureteral calculus and right hydroureter. CT or ultrasound may be helpful for further evaluation. These results will be called to the ordering clinician or representative by the Radiologist Assistant, and communication  documented in the PACS or zVision Dashboard. Electronically Signed   By: Marijo Sanes M.D.   On: 05/05/2015 17:39   Results for orders placed or performed in visit on [id number]  Basic metabolic panel  Result Value Ref Range   Sodium 136 135 - 146 mmol/L   Potassium 3.5 3.5 - 5.3 mmol/L   Chloride 96 (L) 98 - 110 mmol/L   CO2 32 (H) 20 - 31 mmol/L   Glucose, Bld 102 (H) 65 - 99 mg/dL   BUN 18 7 - 25 mg/dL   Creat 1.06 (H) 0.50 - 1.05 mg/dL   Calcium 9.6 8.6 - 10.4 mg/dL  POCT CBC  Result Value Ref Range   WBC 9.5 4.6 - 10.2 K/uL   Lymph, poc 2.3 0.6 - 3.4   POC LYMPH PERCENT 24.3 10 - 50 %L   MID (cbc)  0.4 0 - 0.9   POC MID % 3.7 0 - 12 %M   POC Granulocyte 6.8 2 - 6.9   Granulocyte percent 72.0 37 - 80 %G   RBC 4.65 4.04 - 5.48 M/uL   Hemoglobin 13.4 12.2 - 16.2 g/dL   HCT, POC 39.0 37.7 - 47.9 %   MCV 83.8 80 - 97 fL   MCH, POC 28.8 27 - 31.2 pg   MCHC 34.4 31.8 - 35.4 g/dL   RDW, POC 12.8 %   Platelet Count, POC 253 142 - 424 K/uL   MPV 7.7 0 - 99.8 fL       Assessment & Plan:   1. H/O nephrolithotomy with removal of calculi   2. Hematuria   3. Acute cystitis with hematuria   4. 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  Now with new 66mm right renal stone seen - pt with some new flank pain and hematuria but I am quite concerned as also is + for E.coli UTI with signs of hydronephrosis seen on KUB and Cr bump on labs today - Given 1g Rocephin x 1 IM in office today, start cipro 500 bid x 1 wk tomorrow. Stat abd/pelvic CT to better characterize size of stone, hydronephrosis, and any obstruction with stat referral back to urology.  Pt will RTC immed if she is starting to feel worse - any f/c/flank pain, urine changes, etc.  5.   Inflammed sebaceous cyst under left axilla - lateral thorax - newly inflammed - induration but no  sig fluctuance.  Warm wet compresses freq and rocephin inj today may help.  If worsening at all, RTC for I&D.  Orders Placed This Encounter  Procedures  . DG Abd 1 View    Standing Status: Future     Number of Occurrences: 1     Standing Expiration Date: 05/04/2016    Order Specific Question:  Reason for Exam (SYMPTOM  OR DIAGNOSIS REQUIRED)    Answer:  H/O KIDNEY STONE, HEMATURIA    Order Specific Question:  Is the patient pregnant?    Answer:  No    Order Specific Question:  Preferred imaging location?    Answer:  External  . CT Abdomen Pelvis Wo Contrast    Standing Status: Future     Number of Occurrences:      Standing Expiration Date: 08/02/2016    Order Specific Question:  Reason for Exam (SYMPTOM  OR DIAGNOSIS REQUIRED)    Answer:  eval ~6 mm right kidney stone    Order Specific Question:  Is the patient pregnant?    Answer:  No    Order Specific Question:  Preferred imaging location?    Answer:  [facility]  . Basic metabolic panel    Order Specific Question:  Has the patient fasted?    Answer:  No  . Ambulatory referral to Urology    Referral Priority:  Urgent    Referral Type:  Consultation    Referral Reason:  Specialty Services Required    Requested Specialty:  Urology    Number of Visits Requested:  1  . Care order/instruction    Pt has AVS - can go after her 15 min after Rocephin shot  . POCT CBC    Meds ordered this encounter  Medications  . cefTRIAXone (ROCEPHIN) injection 1 g  Sig:     Order Specific Question:  Antibiotic Indication:    Answer:  UTI    I personally performed the services described in this documentation, which was scribed in my presence. The recorded information has been reviewed and considered, and addended by me as needed.  [name], MD MPH

## 2015-05-05 NOTE — Patient Instructions (Signed)

## 2015-05-06 ENCOUNTER — Telehealth: Payer: Self-pay

## 2015-05-06 NOTE — Telephone Encounter (Signed)
PA needed for adderall ER 30 mg caps. Started on covermymeds, but waiting for clin questions to load.

## 2015-05-07 NOTE — Telephone Encounter (Signed)
PA completed on covermymeds and approved. Pharm notified.

## 2015-05-09 ENCOUNTER — Other Ambulatory Visit: Payer: BC Managed Care – PPO

## 2015-05-14 ENCOUNTER — Other Ambulatory Visit: Payer: Self-pay | Admitting: Family Medicine

## 2015-06-09 ENCOUNTER — Telehealth: Payer: Self-pay

## 2015-06-09 NOTE — Telephone Encounter (Signed)
Pt wants Dr Brigitte Pulse to know the Adderal is working great and she needs refill   Best phone for pt is [phone]

## 2015-06-12 ENCOUNTER — Other Ambulatory Visit (INDEPENDENT_AMBULATORY_CARE_PROVIDER_SITE_OTHER): Payer: BC Managed Care – PPO | Admitting: *Deleted

## 2015-06-12 ENCOUNTER — Other Ambulatory Visit: Payer: Self-pay | Admitting: Physician Assistant

## 2015-06-12 DIAGNOSIS — R319 Hematuria, unspecified: Secondary | ICD-10-CM

## 2015-06-12 LAB — POC MICROSCOPIC URINALYSIS (UMFC): Mucus: ABSENT

## 2015-06-12 LAB — POCT URINALYSIS DIP (MANUAL ENTRY)
GLUCOSE UA: NEGATIVE
NITRITE UA: NEGATIVE
Protein Ur, POC: 30 — AB
SPEC GRAV UA: 1.02
UROBILINOGEN UA: 0.2
pH, UA: 6

## 2015-06-12 MED ORDER — AMPHETAMINE-DEXTROAMPHET ER 30 MG PO CP24: 30.0000 mg | ORAL_CAPSULE | Freq: Every day | ORAL | Status: DC

## 2015-06-12 NOTE — Telephone Encounter (Signed)
LMOM that RF is ready and asked for CB to report about psych appt.

## 2015-06-12 NOTE — Telephone Encounter (Signed)
Pt called back and reported that she has had some major issues with her home this month and having to have some construction done, etc that she had completely forgotten to make psych appt. She stated that she has taken a couple of days off to get caught up on things and will get an appt scheduled w/someone. She did report that the new Adderall is helping and will come p/up the refill to continue. Dr Brigitte Pulse, [place]

## 2015-06-12 NOTE — Telephone Encounter (Addendum)
Does pt have an appt with a psychiatrist yet? Refill printed.

## 2015-07-02 ENCOUNTER — Other Ambulatory Visit: Payer: Self-pay

## 2015-07-02 MED ORDER — ESCITALOPRAM OXALATE 10 MG PO TABS: ORAL_TABLET | ORAL | Status: DC

## 2015-07-23 ENCOUNTER — Telehealth: Payer: Self-pay

## 2015-07-23 NOTE — Telephone Encounter (Signed)
Pt would like to speak with Dr Brigitte Pulse or her nurse about her medication, states she doesn't want to change it. Please call [phone]

## 2015-07-24 NOTE — Telephone Encounter (Signed)
SPoke with pt, she states Dr. Brigitte Pulse was thinking about changing her Adderall but she does not want to change.

## 2015-07-24 NOTE — Telephone Encounter (Signed)
She would like to go to the short acting 12.5mg  in the morning and another one around 2pm.

## 2015-07-24 NOTE — Telephone Encounter (Signed)
Pt was supposed to schedule an appointment with a psychiatrist for medication review.  Has she done this?  Also, it has been 3 months since her last visit so she is due for an appointment if she wants me to cont rxing her stimulant.

## 2015-07-25 NOTE — Telephone Encounter (Signed)
Fine, will put pt back on to her original dose that she "was on for years" of 10 bid with 1 mo supply.

## 2015-07-25 NOTE — Telephone Encounter (Signed)
Spoke with pt, she has an appt with you on 5/11. She has not found a psychiatrist because she does not want to go up on her dose.anymore. She wants to go on the dose she was on for years. No change.

## 2015-07-25 NOTE — Telephone Encounter (Signed)
Dr. Brigitte Pulse can we please give pt an answer by tomorrow. She stated she called on Monday but there was no message put in. Pt is a little upset about how this process went. She wants to wait to see you on 5/11 but still needs an Rx. Please advise. I will not be here tomorrow, if you can get the TL to call patient with a response tomorrow. Please and thank you.

## 2015-07-25 NOTE — Telephone Encounter (Signed)
Left message for pt to call back  °

## 2015-07-25 NOTE — Telephone Encounter (Signed)
Please write the rx and call pt when ready.  She will pick up on Sat 4/8.

## 2015-07-26 MED ORDER — AMPHETAMINE-DEXTROAMPHETAMINE 10 MG PO TABS: 10.0000 mg | ORAL_TABLET | Freq: Two times a day (BID) | ORAL | Status: DC

## 2015-07-26 NOTE — Telephone Encounter (Signed)
rx ready for pickup 

## 2015-08-28 ENCOUNTER — Ambulatory Visit: Payer: BC Managed Care – PPO | Admitting: Family Medicine

## 2015-09-18 ENCOUNTER — Ambulatory Visit (INDEPENDENT_AMBULATORY_CARE_PROVIDER_SITE_OTHER): Payer: BC Managed Care – PPO | Admitting: Family Medicine

## 2015-09-18 ENCOUNTER — Encounter: Payer: Self-pay | Admitting: Family Medicine

## 2015-09-18 VITALS — BP 134/82 | HR 82 | Temp 98.2°F | Resp 16 | Ht 66.0 in | Wt 183.5 lb

## 2015-09-18 DIAGNOSIS — F902 Attention-deficit hyperactivity disorder, combined type: Secondary | ICD-10-CM

## 2015-09-18 DIAGNOSIS — K589 Irritable bowel syndrome without diarrhea: Secondary | ICD-10-CM | POA: Diagnosis not present

## 2015-09-18 DIAGNOSIS — R319 Hematuria, unspecified: Secondary | ICD-10-CM | POA: Diagnosis not present

## 2015-09-18 DIAGNOSIS — F418 Other specified anxiety disorders: Secondary | ICD-10-CM | POA: Diagnosis not present

## 2015-09-18 DIAGNOSIS — I10 Essential (primary) hypertension: Secondary | ICD-10-CM

## 2015-09-18 DIAGNOSIS — R600 Localized edema: Secondary | ICD-10-CM

## 2015-09-18 LAB — POCT URINALYSIS DIP (MANUAL ENTRY)
Bilirubin, UA: NEGATIVE
GLUCOSE UA: NEGATIVE
Ketones, POC UA: NEGATIVE
NITRITE UA: NEGATIVE
Protein Ur, POC: NEGATIVE
Spec Grav, UA: <=1.005
UROBILINOGEN UA: 0.2
pH, UA: 5.5

## 2015-09-18 LAB — POC MICROSCOPIC URINALYSIS (UMFC): Mucus: ABSENT

## 2015-09-18 MED ORDER — VALACYCLOVIR HCL 500 MG PO TABS: ORAL_TABLET | ORAL | Status: DC

## 2015-09-18 MED ORDER — AMPHETAMINE-DEXTROAMPHETAMINE 10 MG PO TABS: 10.0000 mg | ORAL_TABLET | Freq: Two times a day (BID) | ORAL | Status: DC

## 2015-09-18 MED ORDER — HYDROCHLOROTHIAZIDE 25 MG PO TABS: 25.0000 mg | ORAL_TABLET | Freq: Every day | ORAL | Status: DC

## 2015-09-18 MED ORDER — ESCITALOPRAM OXALATE 10 MG PO TABS: ORAL_TABLET | ORAL | Status: DC

## 2015-09-18 MED ORDER — DIPHENOXYLATE-ATROPINE 2.5-0.025 MG PO TABS: 1.0000 | ORAL_TABLET | Freq: Four times a day (QID) | ORAL | Status: DC | PRN

## 2015-09-18 NOTE — Patient Instructions (Addendum)
IF you received an x-ray today, you will receive an invoice from [facility]. Please contact [facility] at [phone] with questions or concerns regarding your invoice.   IF you received labwork today, you will receive an invoice from [employer]. Please contact [facility] at [phone] with questions or concerns regarding your invoice.   Our billing staff will not be able to assist you with questions regarding bills from these companies.  You will be contacted with the lab results as soon as they are available. The fastest way to get your results is to activate your My Chart account. Instructions are located on the last page of this paperwork. If you have not heard from [place] regarding the results in 2 weeks, please contact this office.    Managing Your High Blood Pressure Blood pressure is a measurement of how forceful your blood is pressing against the walls of the arteries. Arteries are muscular tubes within the circulatory system. Blood pressure does not stay the same. Blood pressure rises when you are active, excited, or nervous; and it lowers during sleep and relaxation. If the numbers measuring your blood pressure stay above normal most of the time, you are at risk for health problems. High blood pressure (hypertension) is a long-term (chronic) condition in which blood pressure is elevated. A blood pressure reading is recorded as two numbers, such as 120 over 80 (or 120/80). The first, higher number is called the systolic pressure. It is a measure of the pressure in your arteries as the heart beats. The second, lower number is called the diastolic pressure. It is a measure of the pressure in your arteries as the heart relaxes between beats.  Keeping your blood pressure in a normal range is important to your overall health and prevention of health problems, such as heart disease and stroke. When your blood pressure is uncontrolled, your heart has  to work harder than normal. High blood pressure is a very common condition in adults because blood pressure tends to rise with age. Men and women are equally likely to have hypertension but at different times in life. Before age 14, men are more likely to have hypertension. After 60 years of age, women are more likely to have it. Hypertension is especially common in African Americans. This condition often has no signs or symptoms. The cause of the condition is usually not known. Your caregiver can help you come up with a plan to keep your blood pressure in a normal, healthy range. BLOOD PRESSURE STAGES Blood pressure is classified into four stages: normal, prehypertension, stage 1, and stage 2. Your blood pressure reading will be used to determine what type of treatment, if any, is necessary. Appropriate treatment options are tied to these four stages:  Normal  Systolic pressure (mm Hg): below 120.  Diastolic pressure (mm Hg): below 80. Prehypertension  Systolic pressure (mm Hg): 120 to 139.  Diastolic pressure (mm Hg): 80 to 89. Stage1  Systolic pressure (mm Hg): 140 to 159.  Diastolic pressure (mm Hg): 90 to 99. Stage2  Systolic pressure (mm Hg): 160 or above.  Diastolic pressure (mm Hg): 100 or above. RISKS RELATED TO HIGH BLOOD PRESSURE Managing your blood pressure is an important responsibility. Uncontrolled high blood pressure can lead to:  A heart attack.  A stroke.  A weakened blood vessel (aneurysm).  Heart failure.  Kidney damage.  Eye damage.  Metabolic syndrome.  Memory and concentration problems. HOW TO MANAGE YOUR BLOOD PRESSURE Blood  pressure can be managed effectively with lifestyle changes and medicines (if needed). Your caregiver will help you come up with a plan to bring your blood pressure within a normal range. Your plan should include the following: Education  Read all information provided by your caregivers about how to control blood  pressure.  Educate yourself on the latest guidelines and treatment recommendations. New research is always being done to further define the risks and treatments for high blood pressure. Lifestylechanges  Control your weight.  Avoid smoking.  Stay physically active.  Reduce the amount of salt in your diet.  Reduce stress.  Control any chronic conditions, such as high cholesterol or diabetes.  Reduce your alcohol intake. Medicines  Several medicines (antihypertensive medicines) are available, if needed, to bring blood pressure within a normal range. Communication  Review all the medicines you take with your caregiver because there may be side effects or interactions.  Talk with your caregiver about your diet, exercise habits, and other lifestyle factors that may be contributing to high blood pressure.  See your caregiver regularly. Your caregiver can help you create and adjust your plan for managing high blood pressure. RECOMMENDATIONS FOR TREATMENT AND FOLLOW-UP  The following recommendations are based on current guidelines for managing high blood pressure in nonpregnant adults. Use these recommendations to identify the proper follow-up period or treatment option based on your blood pressure reading. You can discuss these options with your caregiver.  Systolic pressure of 123456 to XX123456 or diastolic pressure of 80 to 89: Follow up with your caregiver as directed.  Systolic pressure of XX123456 to 0000000 or diastolic pressure of 90 to 100: Follow up with your caregiver within 2 months.  Systolic pressure above 0000000 or diastolic pressure above 123XX123: Follow up with your caregiver within 1 month.  Systolic pressure above 99991111 or diastolic pressure above A999333: Consider antihypertensive therapy; follow up with your caregiver within 1 week.  Systolic pressure above A999333 or diastolic pressure above 123456: Begin antihypertensive therapy; follow up with your caregiver within 1 week.   This information is  not intended to replace advice given to you by your health care provider. Make sure you discuss any questions you have with your health care provider.   Document Released: 12/29/2011 Document Reviewed: 12/29/2011 Elsevier Interactive Patient Education [employer].

## 2015-09-18 NOTE — Progress Notes (Signed)
Subjective:    Patient ID: Catherine Cole, female    DOB: 1955-11-18, 60 y.o.   MRN: [id number] Chief Complaint  Patient presents with  . Hypertension  . Medication Refill    adderall 10 mg bid    HPI  prozac and wellbutrin and ritalin -> adderall and then she lost her insurance and had to go to [facility] for everything. She has a fitbit that is going to remind her to get up and walk, tracks her sleep She has an adjustable bed that wasn't set [facility] More headaches THe dermatologist had referred her but wasn't informed of the referral. She has changed to contacts just today so that she can prep for surgery -   Does not take stimulant on the weekends and somedays she just takes morning dose when she forgets to take the afternoon dose as she gets to busy. She is making more notes and to do list - has started unpacking boxes.  We had tried to switch pt to addreall XR 30mg   IBS-D is improved - uses just occ when she is gassy and when she is out. She is having some more RLQ pain Using salt rock lamps which have helped a lot.  She does have a stone down in her RLQ and she thinks it is a piece of stone from prior lithotripsy "schrapnel" fro when she had percutaneous drainage.  Trying to eat healtheir and walking more  Past Medical History  Diagnosis Date  . Depression   . Anxiety   . Anemia   . IBS (irritable bowel syndrome)    Past Surgical History  Procedure Laterality Date  . Appendectomy      12 yrs  . Cholecystectomy  1995  . Fracture surgery    . Tonsillectomy      15 yrs  . Cholesteatoma excision      13 and 22 yrs  . Kidney stone surgery  1995   Current Outpatient Prescriptions on File Prior to Visit  Medication Sig Dispense Refill  . Incontinence Supply Disposable (DEPEND EASY FIT UNDERGARMENTS) MISC Use daily as directed. Change several times daily as needed. 120 each 99  . Melatonin 5 MG TABS Take 1 tablet (5 mg total) by mouth at bedtime. 90 tablet 3   No  current facility-administered medications on file prior to visit.   Allergies  Allergen Reactions  . Codeine   . Demerol Nausea And Vomiting  . Erythromycin Itching and Rash  . Neosporin [Neomycin-Polymyxin B Gu] Itching and Rash   Family History  Problem Relation Age of Onset  . Heart disease Mother 79    Heart attack  . Alcohol abuse Father   . Diabetes Father    Social History   Social History  . Marital Status: Widowed    Spouse Name: N/A  . Number of Children: N/A  . Years of Education: N/A   Social History Main Topics  . Smoking status: Former Research scientist (life sciences)  . Smokeless tobacco: None  . Alcohol Use: No  . Drug Use: No  . Sexual Activity: No   Other Topics Concern  . None   Social History Narrative    Review of Systems See hpi    Objective:  BP 134/82 mmHg  Pulse 82  Temp(Src) 98.2 F (36.8 C) (Oral)  Resp 16  Ht 5\' 6"  (1.676 m)  Wt 183 lb 8 oz (83.235 kg)  BMI 29.63 kg/m2  Physical Exam  Constitutional: She is oriented to person, place, and  time. She appears well-developed and well-nourished. No distress.  HENT:  Head: Normocephalic and atraumatic.  Right Ear: External ear normal.  Left Ear: External ear normal.  Eyes: Conjunctivae are normal. No scleral icterus.  Neck: Normal range of motion. Neck supple. No thyromegaly present.  Cardiovascular: Normal rate, regular rhythm, normal heart sounds and intact distal pulses.   Pulmonary/Chest: Effort normal and breath sounds normal. No respiratory distress.  Musculoskeletal: She exhibits no edema.  Lymphadenopathy:    She has no cervical adenopathy.  Neurological: She is alert and oriented to person, place, and time.  Skin: Skin is warm and dry. She is not diaphoretic. No erythema.  Psychiatric: She has a normal mood and affect. Her behavior is normal.          Assessment & Plan:   1. Hematuria   2. IBS (irritable bowel syndrome)   3. Bilateral edema of lower extremity   4. Essential hypertension,  benign   5. Attention deficit hyperactivity disorder (ADHD), combined type   6. Depression with anxiety      Orders Placed This Encounter  Procedures  . Comprehensive metabolic panel  . TSH  . CBC  . POCT urinalysis dipstick  . POCT Microscopic Urinalysis ([facility])    Meds ordered this encounter  Medications  . amphetamine-dextroamphetamine (ADDERALL) 10 MG tablet    Sig: Take 1 tablet (10 mg total) by mouth 2 (two) times daily.    Dispense:  60 tablet    Refill:  0    May fill 60 days from date written  . amphetamine-dextroamphetamine (ADDERALL) 10 MG tablet    Sig: Take 1 tablet (10 mg total) by mouth 2 (two) times daily.    Dispense:  60 tablet    Refill:  0  . amphetamine-dextroamphetamine (ADDERALL) 10 MG tablet    Sig: Take 1 tablet (10 mg total) by mouth 2 (two) times daily.    Dispense:  60 tablet    Refill:  0    May fill 30 days from date written  . diphenoxylate-atropine (LOMOTIL) 2.5-0.025 MG tablet    Sig: Take 1 tablet by mouth 4 (four) times daily as needed for diarrhea or loose stools.    Dispense:  30 tablet    Refill:  0  . hydrochlorothiazide (HYDRODIURIL) 25 MG tablet    Sig: Take 1 tablet (25 mg total) by mouth daily.    Dispense:  90 tablet    Refill:  1  . escitalopram (LEXAPRO) 10 MG tablet    Sig: TAKE 3 TABLETS (30 MG) ONCE A DAY.    Dispense:  270 tablet    Refill:  1  . valACYclovir (VALTREX) 500 MG tablet    Sig: TAKE 1 TABLET BY MOUTH EVERY DAY OR AS DIRECTED.    Dispense:  30 tablet    Refill:  0     [name], M.D.  [facility] [address] [place], [place] [address] [phone] phone [phone] fax  10/15/2015 8:42 PM  Results for orders placed or performed in visit on 09/18/15  Comprehensive metabolic panel  Result Value Ref Range   Sodium 139 135 - 146 mmol/L   Potassium 3.9 3.5 - 5.3 mmol/L   Chloride 100 98 - 110 mmol/L   CO2 26 20 - 31 mmol/L   Glucose, Bld 97 65 - 99 mg/dL   BUN  14 7 - 25 mg/dL   Creat 0.79 0.50 - 1.05 mg/dL  Total Bilirubin 0.4 0.2 - 1.2 mg/dL   Alkaline Phosphatase 77 33 - 130 U/L   AST 15 10 - 35 U/L   ALT 15 6 - 29 U/L   Total Protein 7.3 6.1 - 8.1 g/dL   Albumin 4.7 3.6 - 5.1 g/dL   Calcium 9.4 8.6 - 10.4 mg/dL  TSH  Result Value Ref Range   TSH 2.97 mIU/L  CBC  Result Value Ref Range   WBC 7.0 3.8 - 10.8 K/uL   RBC 4.57 3.80 - 5.10 MIL/uL   Hemoglobin 12.9 11.7 - 15.5 g/dL   HCT 38.1 35.0 - 45.0 %   MCV 83.4 80.0 - 100.0 fL   MCH 28.2 27.0 - 33.0 pg   MCHC 33.9 32.0 - 36.0 g/dL   RDW 13.7 11.0 - 15.0 %   Platelets 276 140 - 400 K/uL   MPV 10.2 7.5 - 12.5 fL  POCT urinalysis dipstick  Result Value Ref Range   Color, UA yellow yellow   Clarity, UA clear clear   Glucose, UA negative negative   Bilirubin, UA negative negative   Ketones, POC UA negative negative   Spec Grav, UA <=1.005    Blood, UA small (A) negative   pH, UA 5.5    Protein Ur, POC negative negative   Urobilinogen, UA 0.2    Nitrite, UA Negative Negative   Leukocytes, UA small (1+) (A) Negative  POCT Microscopic Urinalysis ([facility])  Result Value Ref Range   WBC,UR,HPF,POC Few (A) None WBC/hpf   RBC,UR,HPF,POC Few (A) None RBC/hpf   Bacteria None None, Too numerous to count   Mucus Absent Absent   Epithelial Cells, UR Per Microscopy Few (A) None, Too numerous to count cells/hpf

## 2015-09-19 LAB — COMPREHENSIVE METABOLIC PANEL
ALK PHOS: 77 U/L (ref 33–130)
ALT: 15 U/L (ref 6–29)
AST: 15 U/L (ref 10–35)
Albumin: 4.7 g/dL (ref 3.6–5.1)
BUN: 14 mg/dL (ref 7–25)
CO2: 26 mmol/L (ref 20–31)
Calcium: 9.4 mg/dL (ref 8.6–10.4)
Chloride: 100 mmol/L (ref 98–110)
Creat: 0.79 mg/dL (ref 0.50–1.05)
GLUCOSE: 97 mg/dL (ref 65–99)
Potassium: 3.9 mmol/L (ref 3.5–5.3)
SODIUM: 139 mmol/L (ref 135–146)
TOTAL PROTEIN: 7.3 g/dL (ref 6.1–8.1)
Total Bilirubin: 0.4 mg/dL (ref 0.2–1.2)

## 2015-09-19 LAB — CBC
HCT: 38.1 % (ref 35.0–45.0)
Hemoglobin: 12.9 g/dL (ref 11.7–15.5)
MCH: 28.2 pg (ref 27.0–33.0)
MCHC: 33.9 g/dL (ref 32.0–36.0)
MCV: 83.4 fL (ref 80.0–100.0)
MPV: 10.2 fL (ref 7.5–12.5)
PLATELETS: 276 10*3/uL (ref 140–400)
RBC: 4.57 MIL/uL (ref 3.80–5.10)
RDW: 13.7 % (ref 11.0–15.0)
WBC: 7 10*3/uL (ref 3.8–10.8)

## 2015-09-19 LAB — TSH: TSH: 2.97 mIU/L

## 2015-10-15 DIAGNOSIS — I10 Essential (primary) hypertension: Secondary | ICD-10-CM | POA: Insufficient documentation

## 2015-12-01 ENCOUNTER — Encounter (HOSPITAL_BASED_OUTPATIENT_CLINIC_OR_DEPARTMENT_OTHER): Payer: Self-pay | Admitting: *Deleted

## 2015-12-02 ENCOUNTER — Ambulatory Visit (INDEPENDENT_AMBULATORY_CARE_PROVIDER_SITE_OTHER): Payer: BC Managed Care – PPO | Admitting: Physician Assistant

## 2015-12-02 VITALS — BP 142/78 | HR 90 | Temp 98.0°F | Resp 16 | Ht 66.0 in | Wt 185.0 lb

## 2015-12-02 DIAGNOSIS — R230 Cyanosis: Secondary | ICD-10-CM | POA: Diagnosis not present

## 2015-12-02 NOTE — Progress Notes (Signed)
12/02/2015 2:16 PM   DOB: Nov 10, 1955 / MRN: [id number]  SUBJECTIVE:  Catherine Cole is a 60 y.o. female presenting for a bruise like rash on her palmar aspect of left thumb.  She immediately googled this and found that she may have cancer or a blood clot.  She associates mild tenderness with the rash.  She is not taking any medications that would thin her blood. Reports that she feels overall well.  She has a proceudre tomorrow to remove a vascular mass from her nose and she states "i've been worried about dying all day."  She has a history of anxiety.   She is allergic to codeine; demerol; erythromycin; and neosporin [neomycin-polymyxin b gu].   She  has a past medical history of Adult ADHD; Anemia; Anxiety; Depression; Hypertension; and IBS (irritable bowel syndrome).    She  reports that she has quit smoking. She has never used smokeless tobacco. She reports that she does not drink alcohol or use drugs. She  reports that she does not engage in sexual activity. The patient  has a past surgical history that includes Appendectomy; Cholecystectomy (1995); Fracture surgery; Tonsillectomy; Cholesteatoma excision; and Kidney stone surgery (1995).  Her family history includes Alcohol abuse in her father; Diabetes in her father; Heart disease (age of onset: 16) in her mother.  Review of Systems  Constitutional: Negative for fever.  Respiratory: Negative for cough.   Musculoskeletal: Negative for myalgias.  Skin: Positive for rash. Negative for itching.  Neurological: Negative for focal weakness and headaches.  Endo/Heme/Allergies: Does not bruise/bleed easily.    The problem list and medications were reviewed and updated by myself where necessary and exist elsewhere in the encounter.   OBJECTIVE:  BP (!) 142/78 (BP Location: Right Arm, Patient Position: Sitting, Cuff Size: Normal)   Pulse 90   Temp 98 F (36.7 C) (Oral)   Resp 16   Ht 5\' 6"  (1.676 m)   Wt 185 lb (83.9 kg)   SpO2 98%    BMI 29.86 kg/m   Physical Exam  Constitutional: She is oriented to person, place, and time. She appears well-developed and well-nourished. No distress.  Cardiovascular: Normal rate and regular rhythm.   Pulmonary/Chest: Effort normal and breath sounds normal.  Musculoskeletal: Normal range of motion. She exhibits tenderness. She exhibits no edema or deformity.       Arms: Neurological: She is alert and oriented to person, place, and time. She has normal reflexes. She displays normal reflexes. No cranial nerve deficit. She exhibits normal muscle tone. Coordination normal.  Skin: Skin is warm and dry. Rash (mildy tender ecchymotic rash about the left palmar thumb) noted. She is not diaphoretic.        Lab Results  Component Value Date   CHOL 200 03/26/2014   HDL 50 03/26/2014   LDLCALC 135 (H) 03/26/2014   LDLDIRECT 160 (H) 06/28/2014   TRIG 76 03/26/2014   CHOLHDL 4.0 03/26/2014     No results found for this or any previous visit (from the past 72 hour(s)).  No results found.  ASSESSMENT AND PLAN  Catherine Cole was seen today for hand pain.  Diagnoses and all orders for this visit:  Bluish skin discoloration: Senile purpura versus acute injury.  The latter makes more sense given she is having mild tenderness. She denies IV drug use.     The patient is advised to call or return to clinic if she does not see an improvement in symptoms, or to seek the  care of the closest emergency department if she worsens with the above plan.   Philis Fendt, MHS, PA-C [facility] 12/02/2015 2:16 PM

## 2015-12-02 NOTE — Patient Instructions (Signed)
     IF you received an x-ray today, you will receive an invoice from [facility]. Please contact [facility] at [phone] with questions or concerns regarding your invoice.   IF you received labwork today, you will receive an invoice from [facility]. Please contact [facility] at [phone] with questions or concerns regarding your invoice.   Our billing staff will not be able to assist you with questions regarding bills from these companies.  You will be contacted with the lab results as soon as they are available. The fastest way to get your results is to activate your My Chart account. Instructions are located on the last page of this paperwork. If you have not heard from us regarding the results in 2 weeks, please contact this office.      

## 2015-12-03 ENCOUNTER — Ambulatory Visit (HOSPITAL_BASED_OUTPATIENT_CLINIC_OR_DEPARTMENT_OTHER)
Admission: RE | Admit: 2015-12-03 | Discharge: 2015-12-03 | Disposition: A | Discharge status: Home / Self Care | Payer: BC Managed Care – PPO | Source: Ambulatory Visit | Attending: Plastic Surgery | Admitting: Plastic Surgery

## 2015-12-03 ENCOUNTER — Ambulatory Visit (HOSPITAL_BASED_OUTPATIENT_CLINIC_OR_DEPARTMENT_OTHER): Payer: BC Managed Care – PPO | Admitting: Anesthesiology

## 2015-12-03 ENCOUNTER — Encounter (HOSPITAL_BASED_OUTPATIENT_CLINIC_OR_DEPARTMENT_OTHER)
Admission: RE | Disposition: A | Discharge status: Home / Self Care | Payer: Self-pay | Source: Ambulatory Visit | Attending: Plastic Surgery

## 2015-12-03 ENCOUNTER — Encounter (HOSPITAL_BASED_OUTPATIENT_CLINIC_OR_DEPARTMENT_OTHER): Payer: Self-pay | Admitting: *Deleted

## 2015-12-03 DIAGNOSIS — Q279 Congenital malformation of peripheral vascular system, unspecified: Secondary | ICD-10-CM | POA: Diagnosis not present

## 2015-12-03 DIAGNOSIS — F419 Anxiety disorder, unspecified: Secondary | ICD-10-CM | POA: Diagnosis not present

## 2015-12-03 DIAGNOSIS — Z79899 Other long term (current) drug therapy: Secondary | ICD-10-CM | POA: Insufficient documentation

## 2015-12-03 DIAGNOSIS — F909 Attention-deficit hyperactivity disorder, unspecified type: Secondary | ICD-10-CM | POA: Diagnosis not present

## 2015-12-03 DIAGNOSIS — F329 Major depressive disorder, single episode, unspecified: Secondary | ICD-10-CM | POA: Insufficient documentation

## 2015-12-03 DIAGNOSIS — I10 Essential (primary) hypertension: Secondary | ICD-10-CM | POA: Insufficient documentation

## 2015-12-03 DIAGNOSIS — Z87891 Personal history of nicotine dependence: Secondary | ICD-10-CM | POA: Insufficient documentation

## 2015-12-03 DIAGNOSIS — L989 Disorder of the skin and subcutaneous tissue, unspecified: Secondary | ICD-10-CM | POA: Diagnosis present

## 2015-12-03 HISTORY — PX: LESION DESTRUCTION: SHX5236

## 2015-12-03 HISTORY — DX: Essential (primary) hypertension: I10

## 2015-12-03 HISTORY — DX: Attention-deficit hyperactivity disorder, unspecified type: F90.9

## 2015-12-03 SURGERY — DESTRUCTION, LESION, FACE
Anesthesia: General | Site: Nose

## 2015-12-03 MED ORDER — LACTATED RINGERS IV SOLN
INTRAVENOUS | Status: DC
  Administered 2015-12-03: 10 mL/h via INTRAVENOUS
  Administered 2015-12-03: 11:00:00 via INTRAVENOUS

## 2015-12-03 MED ORDER — CEFAZOLIN SODIUM-DEXTROSE 2-3 GM-% IV SOLR
INTRAVENOUS | Status: DC | PRN
Start: 2015-12-03 — End: 2015-12-03
  Administered 2015-12-03: 2 g via INTRAVENOUS

## 2015-12-03 MED ORDER — SCOPOLAMINE 1 MG/3DAYS TD PT72
1.0000 | MEDICATED_PATCH | TRANSDERMAL | Status: DC
  Administered 2015-12-03: 1.5 mg via TRANSDERMAL

## 2015-12-03 MED ORDER — MIDAZOLAM HCL 2 MG/2ML IJ SOLN
INTRAMUSCULAR | Status: AC
  Filled 2015-12-03: qty 2

## 2015-12-03 MED ORDER — DEXAMETHASONE SODIUM PHOSPHATE 4 MG/ML IJ SOLN
INTRAMUSCULAR | Status: DC | PRN
  Administered 2015-12-03: 10 mg via INTRAVENOUS

## 2015-12-03 MED ORDER — ONDANSETRON HCL 4 MG/2ML IJ SOLN
INTRAMUSCULAR | Status: DC | PRN
  Administered 2015-12-03: 4 mg via INTRAVENOUS

## 2015-12-03 MED ORDER — FENTANYL CITRATE (PF) 100 MCG/2ML IJ SOLN
50.0000 ug | INTRAMUSCULAR | Status: DC | PRN
  Administered 2015-12-03: 100 ug via INTRAVENOUS

## 2015-12-03 MED ORDER — FENTANYL CITRATE (PF) 100 MCG/2ML IJ SOLN
INTRAMUSCULAR | Status: AC
  Filled 2015-12-03: qty 2

## 2015-12-03 MED ORDER — HYDROMORPHONE HCL 1 MG/ML IJ SOLN
0.2500 mg | INTRAMUSCULAR | Status: DC | PRN
  Administered 2015-12-03: 0.5 mg via INTRAVENOUS

## 2015-12-03 MED ORDER — CEFAZOLIN SODIUM-DEXTROSE 2-4 GM/100ML-% IV SOLN
INTRAVENOUS | Status: AC
  Filled 2015-12-03: qty 100

## 2015-12-03 MED ORDER — ONDANSETRON HCL 4 MG/2ML IJ SOLN
INTRAMUSCULAR | Status: AC
  Filled 2015-12-03: qty 2

## 2015-12-03 MED ORDER — LIDOCAINE-EPINEPHRINE 1 %-1:100000 IJ SOLN
INTRAMUSCULAR | Status: DC | PRN
  Administered 2015-12-03: 1 mL

## 2015-12-03 MED ORDER — SCOPOLAMINE 1 MG/3DAYS TD PT72
MEDICATED_PATCH | TRANSDERMAL | Status: AC
  Filled 2015-12-03: qty 1

## 2015-12-03 MED ORDER — GLYCOPYRROLATE 0.2 MG/ML IJ SOLN: 0.2000 mg | Freq: Once | INTRAMUSCULAR | Status: DC | PRN

## 2015-12-03 MED ORDER — HYDROMORPHONE HCL 1 MG/ML IJ SOLN
INTRAMUSCULAR | Status: AC
  Filled 2015-12-03: qty 1

## 2015-12-03 MED ORDER — DEXAMETHASONE SODIUM PHOSPHATE 10 MG/ML IJ SOLN
INTRAMUSCULAR | Status: AC
  Filled 2015-12-03: qty 1

## 2015-12-03 MED ORDER — MIDAZOLAM HCL 2 MG/2ML IJ SOLN
1.0000 mg | INTRAMUSCULAR | Status: DC | PRN
  Administered 2015-12-03: 2 mg via INTRAVENOUS

## 2015-12-03 MED ORDER — PROPOFOL 10 MG/ML IV BOLUS
INTRAVENOUS | Status: DC | PRN
  Administered 2015-12-03: 250 mg via INTRAVENOUS

## 2015-12-03 MED ORDER — SCOPOLAMINE 1 MG/3DAYS TD PT72: 1.0000 | MEDICATED_PATCH | Freq: Once | TRANSDERMAL | Status: DC | PRN

## 2015-12-03 MED ORDER — PROMETHAZINE HCL 25 MG/ML IJ SOLN: 6.2500 mg | INTRAMUSCULAR | Status: DC | PRN

## 2015-12-03 SURGICAL SUPPLY — 55 items
BLADE CLIPPER SURG (BLADE) IMPLANT
BLADE HEX COATED 2.75 (ELECTRODE) IMPLANT
BLADE SURG 15 STRL LF DISP TIS (BLADE) ×2 IMPLANT
BLADE SURG 15 STRL SS (BLADE) ×2
BNDG CONFORM 2 STRL LF (GAUZE/BANDAGES/DRESSINGS) IMPLANT
BNDG ELASTIC 2X5.8 VLCR STR LF (GAUZE/BANDAGES/DRESSINGS) IMPLANT
CANISTER SUCT 1200ML W/VALVE (MISCELLANEOUS) ×4 IMPLANT
CLOSURE WOUND 1/2 X4 (GAUZE/BANDAGES/DRESSINGS)
COVER BACK TABLE 60X90IN (DRAPES) ×4 IMPLANT
COVER MAYO STAND STRL (DRAPES) ×4 IMPLANT
DERMABOND ADVANCED (GAUZE/BANDAGES/DRESSINGS)
DERMABOND ADVANCED .7 DNX12 (GAUZE/BANDAGES/DRESSINGS) IMPLANT
DRAPE U-SHAPE 76X120 STRL (DRAPES) ×4 IMPLANT
ELECT NEEDLE BLADE 2-5/6 (NEEDLE) ×4 IMPLANT
ELECT REM PT RETURN 9FT ADLT (ELECTROSURGICAL) ×4
ELECT REM PT RETURN 9FT PED (ELECTROSURGICAL)
ELECTRODE REM PT RETRN 9FT PED (ELECTROSURGICAL) IMPLANT
ELECTRODE REM PT RTRN 9FT ADLT (ELECTROSURGICAL) ×2 IMPLANT
GAUZE XEROFORM 1X8 LF (GAUZE/BANDAGES/DRESSINGS) IMPLANT
GLOVE BIO SURGEON STRL SZ 6.5 (GLOVE) ×6 IMPLANT
GLOVE BIO SURGEONS STRL SZ 6.5 (GLOVE) ×2
GLOVE BIOGEL PI IND STRL 7.0 (GLOVE) ×4 IMPLANT
GLOVE BIOGEL PI INDICATOR 7.0 (GLOVE) ×4
GLOVE ECLIPSE 6.5 STRL STRAW (GLOVE) ×4 IMPLANT
GOWN STRL REUS W/ TWL LRG LVL3 (GOWN DISPOSABLE) ×4 IMPLANT
GOWN STRL REUS W/TWL LRG LVL3 (GOWN DISPOSABLE) ×4
KIT SPLINT NASAL DENVER MIN BE (GAUZE/BANDAGES/DRESSINGS) ×4 IMPLANT
NEEDLE HYPO 30GX1 BEV (NEEDLE) ×4 IMPLANT
NEEDLE PRECISIONGLIDE 27X1.5 (NEEDLE) ×4 IMPLANT
NS IRRIG 1000ML POUR BTL (IV SOLUTION) IMPLANT
PACK BASIN DAY SURGERY FS (CUSTOM PROCEDURE TRAY) ×4 IMPLANT
PENCIL BUTTON HOLSTER BLD 10FT (ELECTRODE) ×4 IMPLANT
SHEET MEDIUM DRAPE 40X70 STRL (DRAPES) IMPLANT
SLEEVE SCD COMPRESS KNEE MED (MISCELLANEOUS) ×4 IMPLANT
SPONGE GAUZE 2X2 8PLY STER LF (GAUZE/BANDAGES/DRESSINGS)
SPONGE GAUZE 2X2 8PLY STRL LF (GAUZE/BANDAGES/DRESSINGS) IMPLANT
SPONGE GAUZE 4X4 12PLY STER LF (GAUZE/BANDAGES/DRESSINGS) IMPLANT
STRIP CLOSURE SKIN 1/2X4 (GAUZE/BANDAGES/DRESSINGS) IMPLANT
SUCTION FRAZIER HANDLE 10FR (MISCELLANEOUS) ×2
SUCTION TUBE FRAZIER 10FR DISP (MISCELLANEOUS) ×2 IMPLANT
SUT ETHILON 4 0 PS 2 18 (SUTURE) IMPLANT
SUT ETHILON 5 0 P 3 18 (SUTURE)
SUT MNCRL 6-0 UNDY P1 1X18 (SUTURE) ×2 IMPLANT
SUT MNCRL AB 4-0 PS2 18 (SUTURE) IMPLANT
SUT MON AB 5-0 P3 18 (SUTURE) IMPLANT
SUT MONOCRYL 6-0 P1 1X18 (SUTURE) ×2
SUT NYLON ETHILON 5-0 P-3 1X18 (SUTURE) IMPLANT
SUT VIC AB 5-0 P-3 18X BRD (SUTURE) ×2 IMPLANT
SUT VIC AB 5-0 P3 18 (SUTURE) ×2
SYR BULB 3OZ (MISCELLANEOUS) IMPLANT
SYR CONTROL 10ML LL (SYRINGE) ×4 IMPLANT
TOWEL OR 17X24 6PK STRL BLUE (TOWEL DISPOSABLE) ×4 IMPLANT
TRAY DSU PREP LF (CUSTOM PROCEDURE TRAY) ×4 IMPLANT
TUBE CONNECTING 20'X1/4 (TUBING) ×1
TUBE CONNECTING 20X1/4 (TUBING) ×3 IMPLANT

## 2015-12-03 NOTE — Discharge Instructions (Addendum)
Call your surgeon if you experience:   1.  Fever over 101.0. 3.  Nausea and/or vomiting. 4.  Extreme swelling or bruising at the surgical site. 5.  Continued bleeding from the incision. 6.  Increased pain, redness or drainage from the incision. 7.  Problems related to your pain medication. 8.  Any problems and/or concerns  Keep splint in place Follow up Monday at 3pm No heavy lifting or bending over  Post Anesthesia Home Care Instructions  Activity: Get plenty of rest for the remainder of the day. A responsible adult should stay with you for 24 hours following the procedure.  For the next 24 hours, DO NOT: -Drive a car -Paediatric nurse -Drink alcoholic beverages -Take any medication unless instructed by your physician -Make any legal decisions or sign important papers.  Meals: Start with liquid foods such as gelatin or soup. Progress to regular foods as tolerated. Avoid greasy, spicy, heavy foods. If nausea and/or vomiting occur, drink only clear liquids until the nausea and/or vomiting subsides. Call your physician if vomiting continues.  Special Instructions/Symptoms: Your throat may feel dry or sore from the anesthesia or the breathing tube placed in your throat during surgery. If this causes discomfort, gargle with warm salt water. The discomfort should disappear within 24 hours.  If you had a scopolamine patch placed behind your ear for the management of post- operative nausea and/or vomiting:  1. The medication in the patch is effective for 72 hours, after which it should be removed.  Wrap patch in a tissue and discard in the trash. Wash hands thoroughly with soap and water. 2. You may remove the patch earlier than 72 hours if you experience unpleasant side effects which may include dry mouth, dizziness or visual disturbances. 3. Avoid touching the patch. Wash your hands with soap and water after contact with the patch.

## 2015-12-03 NOTE — Anesthesia Preprocedure Evaluation (Signed)
Anesthesia Evaluation  Patient identified by MRN, date of birth, ID band Patient awake    Reviewed: Allergy & Precautions, NPO status , Patient's Chart, lab work & pertinent test results  Airway Mallampati: II  TM Distance: >3 FB Neck ROM: Full    Dental no notable dental hx. (+) Dental Advisory Given   Pulmonary neg pulmonary ROS, former smoker,    Pulmonary exam normal        Cardiovascular hypertension, Normal cardiovascular exam     Neuro/Psych PSYCHIATRIC DISORDERS Anxiety Depression negative neurological ROS  negative psych ROS   GI/Hepatic negative GI ROS, Neg liver ROS,   Endo/Other  negative endocrine ROS  Renal/GU negative Renal ROS  negative genitourinary   Musculoskeletal negative musculoskeletal ROS (+)   Abdominal   Peds negative pediatric ROS (+)  Hematology negative hematology ROS (+)   Anesthesia Other Findings   Reproductive/Obstetrics negative OB ROS                             Anesthesia Physical Anesthesia Plan  ASA: III  Anesthesia Plan: General   Post-op Pain Management:    Induction: Intravenous  Airway Management Planned: LMA and Oral ETT  Additional Equipment:   Intra-op Plan:   Post-operative Plan: Extubation in OR  Informed Consent: I have reviewed the patients History and Physical, chart, labs and discussed the procedure including the risks, benefits and alternatives for the proposed anesthesia with the patient or authorized representative who has indicated his/her understanding and acceptance.   Dental advisory given  Plan Discussed with: CRNA, Anesthesiologist and Surgeon  Anesthesia Plan Comments:         Anesthesia Quick Evaluation

## 2015-12-03 NOTE — H&P (Signed)
Catherine Cole is an 60 y.o. female.   Chief Complaint: lesion of the nose HPI:The patient is a 60 y.o. yrs old wf here for treatment of a lesion on her nose.  She states that she notice a blue spot on her nose for the past several years.  It is now 2 cm in size and seems to be getting larger. There is a small area that is similar that is 5 mm in size inferior.  It is on the dorsal aspect of the nose.  It seems to get bigger when she stresses.  She does not have any other concerning areas on her nose.  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  That one has not changed recently.  She has hypertension but is otherwise in good health.  She had her facial person puncture the area in the past which seemed to help temporarily.  Nothing else seems to help.  Past Medical History:  Diagnosis Date  . Adult ADHD   . Anemia   . Anxiety   . Depression   . Hypertension   . IBS (irritable bowel syndrome)     Past Surgical History:  Procedure Laterality Date  . APPENDECTOMY     12 yrs  . CHOLECYSTECTOMY  1995  . CHOLESTEATOMA EXCISION     13 and 22 yrs  . FRACTURE SURGERY    . [place]  . TONSILLECTOMY     15 yrs    Family History  Problem Relation Age of Onset  . Heart disease Mother 22    Heart attack  . Alcohol abuse Father   . Diabetes Father    Social History:  reports that she has quit smoking. She has never used smokeless tobacco. She reports that she does not drink alcohol or use drugs.  Allergies:  Allergies  Allergen Reactions  . Codeine   . Demerol Nausea And Vomiting  . Erythromycin Itching and Rash  . Neosporin [Neomycin-Polymyxin B Gu] Itching and Rash    No prescriptions prior to admission.    No results found for this or any previous visit (from the past 48 hour(s)). No results found.  Review of Systems  Constitutional: Negative.   HENT: Negative.   Eyes: Negative.   Respiratory: Negative.    Cardiovascular: Negative.   Gastrointestinal: Negative.   Genitourinary: Negative.   Musculoskeletal: Negative.   Skin: Negative.   Neurological: Negative.   Endo/Heme/Allergies: Negative.   Psychiatric/Behavioral: Negative.     Height 5\' 6"  (1.676 m), weight 81.6 kg (180 lb). Physical Exam  Constitutional: She is oriented to person, place, and time. She appears well-developed and well-nourished.  HENT:  Head: Normocephalic and atraumatic.  Eyes: EOM are normal. Pupils are equal, round, and reactive to light.  Cardiovascular: Normal rate.   Respiratory: Effort normal.  Neurological: She is alert and oriented to person, place, and time.  Skin: Skin is warm.  Psychiatric: She has a normal mood and affect. Her behavior is normal. Judgment and thought content normal.     Assessment/Plan Excision of a lesion of the nose dorsum.  [name], DO 12/03/2015, 8:35 AM

## 2015-12-03 NOTE — Anesthesia Postprocedure Evaluation (Signed)
Anesthesia Post Note  Patient: Catherine Cole  Procedure(s) Performed: Procedure(s) (LRB): EXCISION  and Destruction nasal vascular malformation (N/A)  Patient location during evaluation: PACU Anesthesia Type: General Level of consciousness: sedated Pain management: pain level controlled Vital Signs Assessment: post-procedure vital signs reviewed and stable Respiratory status: spontaneous breathing and respiratory function stable Cardiovascular status: stable Anesthetic complications: no    Last Vitals:  Vitals:   12/03/15 1204 12/03/15 1215  BP:  130/74  Pulse: 81 79  Resp: 14 18  Temp:      Last Pain:  Vitals:   12/03/15 1215  TempSrc:   PainSc: 5                  Adlyn Fife DANIEL

## 2015-12-03 NOTE — Brief Op Note (Signed)
12/03/2015  11:31 AM  PATIENT:  Catherine Cole  60 y.o. female  PRE-OPERATIVE DIAGNOSIS:  nasal vascular malformation  POST-OPERATIVE DIAGNOSIS:  nasal vascular malformation  PROCEDURE:  Procedure(s): EXCISION  and Destruction nasal vascular malformation (N/A)  SURGEON:  Surgeon(s) and Role:    * Marolyn Urschel S Idamae Coccia, DO - Primary  PHYSICIAN ASSISTANT: None  ASSISTANTS: none   ANESTHESIA:   general  EBL:  Total I/O In: 1000 [I.V.:1000] Out: 5 [Blood:5]  BLOOD ADMINISTERED:none  DRAINS: none   LOCAL MEDICATIONS USED:  LIDOCAINE   SPECIMEN:  Source of Specimen:  nasal dorsal tissue  DISPOSITION OF SPECIMEN:  PATHOLOGY  COUNTS:  YES  TOURNIQUET:  * No tourniquets in log *  DICTATION: .Dragon Dictation  PLAN OF CARE: Discharge to home after PACU  PATIENT DISPOSITION:  PACU - hemodynamically stable.   Delay start of Pharmacological VTE agent (>24hrs) due to surgical blood loss or risk of bleeding: no

## 2015-12-03 NOTE — Op Note (Signed)
Operative Note   DATE OF OPERATION: 12/03/2015  LOCATION: [facility]  SURGICAL DIVISION: Plastic Surgery  PREOPERATIVE DIAGNOSES:  Nasal lesion / vascular malformation  POSTOPERATIVE DIAGNOSES:  same  PROCEDURE:  Excision 5 mm and destruction of nasal dorsal vascular malformation 2 cm  SURGEON: Claire Sanger Dillingham, DO  ANESTHESIA:  General.   COMPLICATIONS: None.   INDICATIONS FOR PROCEDURE:  The patient, Catherine Cole is a 60 y.o. female born on 04/13/1956, is here for treatment of a lesion on her nose believed to be a vascular malformation.  It has been present for years and getting larger. MRN: [id number]  CONSENT:  Informed consent was obtained directly from the patient. Risks, benefits and alternatives were fully discussed. Specific risks including but not limited to bleeding, infection, hematoma, seroma, scarring, pain, infection, contracture, asymmetry, wound healing problems, and need for further surgery were all discussed. The patient did have an ample opportunity to have questions answered to satisfaction.   DESCRIPTION OF PROCEDURE:  The patient was taken to the operating room. SCDs were placed and IV antibiotics were given. The patient's operative site was prepped and draped in a sterile fashion. A time out was performed and all information was confirmed to be correct.  General anesthesia was administered.  The skin was injected with local but not deep for intraoperative hemostasis.  The #15 blade was used to make an incision in the skin over the dorsal aspect of the nose.  The scissors were used to dissect around the lesion.  It appeared to be coming from the left lateral angular vessel.  This was coagulated with the bovie 2 cm and had lasting results as it was observed in the OR.  A portion of the tissue directly under the skin was excised 5 mm and sent to pathology.  The nose was closed with 6-0 Monocryl and steri strips applied with a splint  on top. The patient tolerated the procedure well.  There were no complications. The patient was allowed to wake from anesthesia, extubated and taken to the recovery room in satisfactory condition.

## 2015-12-03 NOTE — Transfer of Care (Signed)
Immediate Anesthesia Transfer of Care Note  Patient: Catherine Cole  Procedure(s) Performed: Procedure(s): EXCISION  and Destruction nasal vascular malformation (N/A)  Patient Location: PACU  Anesthesia Type:General  Level of Consciousness: awake, alert  and patient cooperative  Airway & Oxygen Therapy: Patient Spontanous Breathing and Patient connected to face mask oxygen  Post-op Assessment: Report given to RN, Post -op Vital signs reviewed and stable and Patient moving all extremities  Post vital signs: Reviewed and stable  Last Vitals:  Vitals:   12/03/15 1014 12/03/15 1140  BP: 140/85 (!) (P) 141/77  Pulse: 74   Resp: 18   Temp: 36.8 C (P) 36.6 C    Last Pain:  Vitals:   12/03/15 1014  TempSrc: Oral         Complications: No apparent anesthesia complications

## 2015-12-03 NOTE — Anesthesia Procedure Notes (Signed)
Procedure Name: LMA Insertion Date/Time: 12/03/2015 10:48 AM Performed by: Maryella Shivers Pre-anesthesia Checklist: Patient identified, Emergency Drugs available, Suction available and Patient being monitored Patient Re-evaluated:Patient Re-evaluated prior to inductionOxygen Delivery Method: Circle system utilized Preoxygenation: Pre-oxygenation with 100% oxygen Intubation Type: IV induction Ventilation: Mask ventilation without difficulty LMA: LMA flexible inserted LMA Size: 4.0 Number of attempts: 1 Airway Equipment and Method: Bite block Placement Confirmation: positive ETCO2 Tube secured with: Tape Dental Injury: Teeth and Oropharynx as per pre-operative assessment

## 2015-12-04 ENCOUNTER — Encounter (HOSPITAL_BASED_OUTPATIENT_CLINIC_OR_DEPARTMENT_OTHER): Payer: Self-pay | Admitting: Plastic Surgery

## 2015-12-12 DIAGNOSIS — K589 Irritable bowel syndrome without diarrhea: Secondary | ICD-10-CM

## 2015-12-23 ENCOUNTER — Other Ambulatory Visit: Payer: Self-pay | Admitting: Family Medicine

## 2015-12-23 DIAGNOSIS — Z1231 Encounter for screening mammogram for malignant neoplasm of breast: Secondary | ICD-10-CM

## 2016-01-01 ENCOUNTER — Ambulatory Visit
Admission: RE | Admit: 2016-01-01 | Discharge: 2016-01-01 | Disposition: A | Discharge status: Home / Self Care | Payer: BC Managed Care – PPO | Source: Ambulatory Visit | Attending: Family Medicine | Admitting: Family Medicine

## 2016-01-01 DIAGNOSIS — Z1231 Encounter for screening mammogram for malignant neoplasm of breast: Secondary | ICD-10-CM

## 2016-01-05 ENCOUNTER — Encounter: Payer: Self-pay | Admitting: Family Medicine

## 2016-01-05 LAB — HM COLONOSCOPY

## 2016-02-17 ENCOUNTER — Ambulatory Visit (INDEPENDENT_AMBULATORY_CARE_PROVIDER_SITE_OTHER): Payer: BC Managed Care – PPO | Admitting: Family Medicine

## 2016-02-17 VITALS — BP 102/66 | HR 82 | Temp 98.6°F | Resp 18 | Ht 66.0 in | Wt 186.0 lb

## 2016-02-17 DIAGNOSIS — D1800 Hemangioma unspecified site: Secondary | ICD-10-CM | POA: Diagnosis not present

## 2016-02-17 DIAGNOSIS — F902 Attention-deficit hyperactivity disorder, combined type: Secondary | ICD-10-CM | POA: Diagnosis not present

## 2016-02-17 MED ORDER — AMPHETAMINE-DEXTROAMPHETAMINE 10 MG PO TABS: 10.0000 mg | ORAL_TABLET | Freq: Two times a day (BID) | ORAL | 0 refills | Status: DC

## 2016-02-17 NOTE — Progress Notes (Signed)
Subjective:  This chart was scribed for [name] MD, by [name], at [facility].  This patient was seen in room 2 and the patient's care was started at 9:20 AM.   Chief Complaint  Patient presents with  . Medication Refill    ADDERALL     Patient ID: Catherine Cole, female    DOB: December 27, 1955, 60 y.o.   MRN: [id number]  HPI HPI Comments: Catherine Cole is a 60 y.o. female who presents to the [facility] for a refill of her Adderall.  She has "backed off" of her adderall recently due to her blood pressure.  She has been keeping track of her blood pressure which has been running in the "normal ranges".     She has been having difficulty emptying her bladder/ intermittent frequency as well as occasional right lower quadrant pain which has been ocurring "randomly".  She does have bladder incontinence if she waits too long to go to the restroom and keeps adult depends close by to her. Patient had an ultrasound completed for this and was referred to a urologist but could not be seen by them as she had a balance to pay.  Patient also had a colonoscopy completed in which benign polyps were found. She still has her ovaries. Patient is still taking her HCTZ and lexapro. She has noticed that if she has a soft drink at night time (no diet drinks), she gets up more frequently to urinate. When patient has water with her dinner, she wakes up about 1-2 times per night. She denies any dysuria.   Patient recently had surgery on her nose (December 03 2015) - excision and destruction for a nasal vascular malformation. She is going in for  a laser treatment on her nose tomorrow.    Patient has received her flu shot this season.   Past Medical History:  Diagnosis Date  . Adult ADHD   . Anemia   . Anxiety   . Depression   . Hypertension   . IBS (irritable bowel syndrome)     Current Outpatient Prescriptions on File Prior to Visit  Medication Sig  Dispense Refill  . amphetamine-dextroamphetamine (ADDERALL) 10 MG tablet Take 1 tablet (10 mg total) by mouth 2 (two) times daily. 60 tablet 0  . diphenoxylate-atropine (LOMOTIL) 2.5-0.025 MG tablet Take 1 tablet by mouth 4 (four) times daily as needed for diarrhea or loose stools. 30 tablet 0  . escitalopram (LEXAPRO) 10 MG tablet TAKE 3 TABLETS (30 MG) ONCE A DAY. 270 tablet 1  . hydrochlorothiazide (HYDRODIURIL) 25 MG tablet Take 1 tablet (25 mg total) by mouth daily. 90 tablet 1  . Incontinence Supply Disposable (DEPEND EASY FIT UNDERGARMENTS) MISC Use daily as directed. Change several times daily as needed. 120 each 99  . Melatonin 5 MG TABS Take 1 tablet (5 mg total) by mouth at bedtime. 90 tablet 3  . Multiple Vitamin (MULTIVITAMIN WITH MINERALS) TABS tablet Take 1 tablet by mouth daily.    . valACYclovir (VALTREX) 500 MG tablet TAKE 1 TABLET BY MOUTH EVERY DAY OR AS DIRECTED. 30 tablet 0   No current facility-administered medications on file prior to visit.     Allergies  Allergen Reactions  . Demerol Nausea And Vomiting  . Codeine Nausea And Vomiting  . Erythromycin Itching and Rash  . Neosporin [Neomycin-Polymyxin B Gu] Itching and Rash    Review of Systems  Constitutional: Negative for chills and fever.  Eyes: Negative for pain, redness and itching.  Respiratory: Negative for cough and shortness of breath.   Gastrointestinal: Negative for nausea and vomiting.  Genitourinary: Positive for frequency.  Musculoskeletal: Negative for neck pain and neck stiffness.  Neurological: Negative for speech difficulty.       Objective:   Physical Exam  Constitutional: She is oriented to person, place, and time. She appears well-developed and well-nourished. No distress.  HENT:  Head: Normocephalic and atraumatic.  Eyes: Pupils are equal, round, and reactive to light.  Cardiovascular: Normal rate, regular rhythm and normal heart sounds.  Exam reveals no friction rub.   No murmur  heard. Pulmonary/Chest: Effort normal and breath sounds normal. No respiratory distress. She has no wheezes. She has no rales.  Neurological: She is alert and oriented to person, place, and time.  Skin: Skin is warm and dry.  Psychiatric: She has a normal mood and affect.    Vitals:   02/17/16 0854  BP: 102/66  Pulse: 82  Resp: 18  Temp: 98.6 F (37 C)  TempSrc: Oral  SpO2: 95%  Weight: 186 lb (84.4 kg)  Height: 5\' 6"  (1.676 m)       Assessment & Plan:   1. Hemangioma   Removed from nose, pt upset about outcome but rec to be pt and cont to f/u with plastics prn.  2.  ADD - refilled   Meds ordered this encounter  Medications  . amphetamine-dextroamphetamine (ADDERALL) 10 MG tablet    Sig: Take 1 tablet (10 mg total) by mouth 2 (two) times daily.    Dispense:  60 tablet    Refill:  0    May fill 60 days from date written  . amphetamine-dextroamphetamine (ADDERALL) 10 MG tablet    Sig: Take 1 tablet (10 mg total) by mouth 2 (two) times daily.    Dispense:  60 tablet    Refill:  0    May fill 30 days after date wrriten  . amphetamine-dextroamphetamine (ADDERALL) 10 MG tablet    Sig: Take 1 tablet (10 mg total) by mouth 2 (two) times daily.    Dispense:  60 tablet    Refill:  0    I personally performed the services described in this documentation, which was scribed in my presence. The recorded information has been reviewed and considered, and addended by me as needed.   [name], M.D.  [facility] [address] [place], [place] [address] [phone] phone [phone] fax  02/20/16 10:05 PM

## 2016-02-17 NOTE — Patient Instructions (Signed)
     IF you received an x-ray today, you will receive an invoice from [facility]. Please contact [facility] at [phone] with questions or concerns regarding your invoice.   IF you received labwork today, you will receive an invoice from [facility]. Please contact [facility] at [phone] with questions or concerns regarding your invoice.   Our billing staff will not be able to assist you with questions regarding bills from these companies.  You will be contacted with the lab results as soon as they are available. The fastest way to get your results is to activate your My Chart account. Instructions are located on the last page of this paperwork. If you have not heard from us regarding the results in 2 weeks, please contact this office.      

## 2016-04-08 ENCOUNTER — Other Ambulatory Visit: Payer: Self-pay | Admitting: Family Medicine

## 2016-04-11 ENCOUNTER — Other Ambulatory Visit: Payer: Self-pay | Admitting: Family Medicine

## 2016-05-19 NOTE — Progress Notes (Signed)
Subjective:    Patient ID: Catherine Cole, female    DOB: 12-23-1955, 61 y.o.   MRN: [id number] Chief Complaint  Patient presents with  . Medication Refill    ADDERALL, DEPEND EASY FIT UNDERGARMENTS,LEXAPRO, and HCTZ         HPI  Catherine Cole is a 61 yo woman here today for a follow-up on her chronic medical conditions and medication refills.  ADD: on Adderall 10 bid. We had tried to switch pt to addreall XR 30mg . Was on ritalin prior. Was having some elevated blood pressure on higher dose prior. Does not take stimulant on the weekends and somedays she just takes morning dose when she forgets to take the afternoon dose as she gets to busy. Gets overwhelmed and shut down.  Her memory is fantastic but ever since she fell she has had trouble remember certain things but is rare.  She does spread sheets at work and she will sometimes catches errors that she wouldn't have made before.  Trying to be proactive with prayer and positive thought.  She has trouble getting out of bed even when she is already awake in the morning. She got a new computer at work and has been able to use it more effectively that she expected herself to be able to  Urinary incontinence: She has been having difficulty emptying her bladder/ intermittent frequency as well as occasional right lower quadrant pain which has been ocurring "randomly".  She does have bladder incontinence if she waits too long to go to the restroom and keeps adult depends close by to her. Patient had an ultrasound completed for this and was referred to a urologist but could not be seen by them as she had a balance to pay.  Nocturia of 1-2x/night.  HTN: hctz 25  Mood d/o: lexapo 10. Has been on prozac and wellbutrin prior.  IBS-D: pn lomotil really depent on her diet like coffee. Uses the depends for this.  Her nose on the hemogioma is doingwell on laser tgretments.  Past Medical History:  Diagnosis Date  . Adult ADHD   . Anemia   . Anxiety   .  Depression   . Hypertension   . IBS (irritable bowel syndrome)    Past Surgical History:  Procedure Laterality Date  . APPENDECTOMY     12 yrs  . CHOLECYSTECTOMY  1995  . CHOLESTEATOMA EXCISION     13 and 22 yrs  . FRACTURE SURGERY    . [place]  . LESION DESTRUCTION N/A 12/03/2015   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;  Surgeon: [name], DO;  Location: [facility];  Service: Plastics;  Laterality: N/A;  . TONSILLECTOMY     15 yrs   Current Outpatient Prescriptions on File Prior to Visit  Medication Sig Dispense Refill  . diphenoxylate-atropine (LOMOTIL) 2.5-0.025 MG tablet Take 1 tablet by mouth 4 (four) times daily as needed for diarrhea or loose stools. 30 tablet 0  . Multiple Vitamin (MULTIVITAMIN WITH MINERALS) TABS tablet Take 1 tablet by mouth daily.    . valACYclovir (VALTREX) 500 MG tablet TAKE 1 TABLET BY MOUTH EVERY DAY OR AS DIRECTED. 30 tablet 0  . Melatonin 5 MG TABS Take 1 tablet (5 mg total) by mouth at bedtime. (Patient not taking: Reported on 05/20/2016) 90 tablet 3   No current facility-administered medications on file prior to visit.    Allergies  Allergen Reactions  . Demerol Nausea And  Vomiting  . Codeine Nausea And Vomiting  . Erythromycin Itching and Rash  . Neosporin [Neomycin-Polymyxin B Gu] Itching and Rash   Family History  Problem Relation Age of Onset  . Heart disease Mother 40    Heart attack  . Alcohol abuse Father   . Diabetes Father    Social History   Social History  . Marital status: Widowed    Spouse name: N/A  . Number of children: N/A  . Years of education: N/A   Social History Main Topics  . Smoking status: Former Research scientist (life sciences)  . Smokeless tobacco: Never Used  . Alcohol use No  . Drug use: No  . Sexual activity: No   Other Topics Concern  . None   Social History Narrative  . None   Depression screen [facility] 2/9 05/20/2016 02/17/2016 12/02/2015 09/18/2015  05/05/2015  Decreased Interest 0 0 0 0 3  Down, Depressed, Hopeless 0 0 0 0 2  PHQ - 2 Score 0 0 0 0 5  Altered sleeping - - - - 2  Tired, decreased energy - - - - 3  Change in appetite - - - - 2  Feeling bad or failure about yourself  - - - - 1  Trouble concentrating - - - - 2  Moving slowly or fidgety/restless - - - - 2  Suicidal thoughts - - - - 0  PHQ-9 Score - - - - 17  Difficult doing work/chores - - - - Somewhat difficult    Review of Systems  Constitutional: Negative for activity change, appetite change, chills and fever.  Gastrointestinal: Positive for diarrhea.  Genitourinary: Positive for enuresis and frequency.  Psychiatric/Behavioral: Positive for dysphoric mood. Negative for decreased concentration and sleep disturbance. The patient is not nervous/anxious.        Objective:   Physical Exam  Constitutional: She is oriented to person, place, and time. She appears well-developed and well-nourished. No distress.  HENT:  Head: Normocephalic and atraumatic.  Right Ear: External ear normal.  Left Ear: External ear normal.  Eyes: Conjunctivae are normal. No scleral icterus.  Neck: Normal range of motion. Neck supple. No thyromegaly present.  Cardiovascular: Normal rate, regular rhythm, normal heart sounds and intact distal pulses.   Pulmonary/Chest: Effort normal and breath sounds normal. No respiratory distress.  Musculoskeletal: She exhibits no edema.  Lymphadenopathy:    She has no cervical adenopathy.  Neurological: She is alert and oriented to person, place, and time.  Skin: Skin is warm and dry. She is not diaphoretic. No erythema.  Psychiatric: She has a normal mood and affect. Her behavior is normal.      BP 136/72 (BP Location: Left Arm, Cuff Size: Normal)   Pulse 89   Temp 98.9 F (37.2 C) (Oral)   Resp 16   Ht 5\' 6"  (1.676 m)   Wt 184 lb 9.6 oz (83.7 kg)   SpO2 96%   BMI 29.80 kg/m      Assessment & Plan:   1. Essential hypertension, benign     2. Depression with anxiety   3. Attention deficit hyperactivity disorder (ADHD), unspecified ADHD type      Meds ordered this encounter  Medications  . amphetamine-dextroamphetamine (ADDERALL) 10 MG tablet    Sig: Take 1 tablet (10 mg total) by mouth 2 (two) times daily.    Dispense:  60 tablet    Refill:  0    May fill 60 days from date written  . amphetamine-dextroamphetamine (ADDERALL) 10 MG  tablet    Sig: Take 1 tablet (10 mg total) by mouth 2 (two) times daily.    Dispense:  60 tablet    Refill:  0    May fill 30 days after date wrriten  . amphetamine-dextroamphetamine (ADDERALL) 10 MG tablet    Sig: Take 1 tablet (10 mg total) by mouth 2 (two) times daily.    Dispense:  60 tablet    Refill:  0  . escitalopram (LEXAPRO) 10 MG tablet    Sig: Take 3 tablets (30 mg total) by mouth daily.    Dispense:  270 tablet    Refill:  3  . hydrochlorothiazide (HYDRODIURIL) 25 MG tablet    Sig: Take 1 tablet (25 mg total) by mouth daily.    Dispense:  90 tablet    Refill:  1  . Incontinence Supply Disposable (DEPEND EASY FIT UNDERGARMENTS) MISC    Sig: Use daily as directed. Change several times daily as needed.    Dispense:  120 each    Refill:  99    [name], M.D.  Primary Care at [facility] [address] [place], [place] [address] [phone] phone [phone] fax  06/08/16 10:06 PM

## 2016-05-20 ENCOUNTER — Ambulatory Visit (INDEPENDENT_AMBULATORY_CARE_PROVIDER_SITE_OTHER): Payer: BC Managed Care – PPO | Admitting: Family Medicine

## 2016-05-20 ENCOUNTER — Encounter: Payer: Self-pay | Admitting: Family Medicine

## 2016-05-20 VITALS — BP 136/72 | HR 89 | Temp 98.9°F | Resp 16 | Ht 66.0 in | Wt 184.6 lb

## 2016-05-20 DIAGNOSIS — I10 Essential (primary) hypertension: Secondary | ICD-10-CM | POA: Diagnosis not present

## 2016-05-20 DIAGNOSIS — F909 Attention-deficit hyperactivity disorder, unspecified type: Secondary | ICD-10-CM

## 2016-05-20 DIAGNOSIS — F418 Other specified anxiety disorders: Secondary | ICD-10-CM

## 2016-05-20 MED ORDER — HYDROCHLOROTHIAZIDE 25 MG PO TABS: 25.0000 mg | ORAL_TABLET | Freq: Every day | ORAL | 1 refills | Status: DC

## 2016-05-20 MED ORDER — DEPEND EASY FIT UNDERGARMENTS MISC: 99 refills | Status: DC

## 2016-05-20 MED ORDER — ESCITALOPRAM OXALATE 10 MG PO TABS: 30.0000 mg | ORAL_TABLET | Freq: Every day | ORAL | 3 refills | Status: DC

## 2016-05-20 MED ORDER — AMPHETAMINE-DEXTROAMPHETAMINE 10 MG PO TABS: 10.0000 mg | ORAL_TABLET | Freq: Two times a day (BID) | ORAL | 0 refills | Status: DC

## 2016-05-20 NOTE — Patient Instructions (Signed)
     IF you received an x-ray today, you will receive an invoice from [facility]. Please contact [facility] at [phone] with questions or concerns regarding your invoice.   IF you received labwork today, you will receive an invoice from [facility]. Please contact [facility] at [phone] with questions or concerns regarding your invoice.   Our billing staff will not be able to assist you with questions regarding bills from these companies.  You will be contacted with the lab results as soon as they are available. The fastest way to get your results is to activate your My Chart account. Instructions are located on the last page of this paperwork. If you have not heard from us regarding the results in 2 weeks, please contact this office.     

## 2016-07-01 ENCOUNTER — Ambulatory Visit (INDEPENDENT_AMBULATORY_CARE_PROVIDER_SITE_OTHER): Payer: BC Managed Care – PPO | Admitting: Physician Assistant

## 2016-07-01 VITALS — BP 138/80 | HR 81 | Temp 98.5°F | Resp 16 | Ht 66.0 in | Wt 188.0 lb

## 2016-07-01 DIAGNOSIS — T3695XA Adverse effect of unspecified systemic antibiotic, initial encounter: Secondary | ICD-10-CM

## 2016-07-01 DIAGNOSIS — L0291 Cutaneous abscess, unspecified: Secondary | ICD-10-CM

## 2016-07-01 DIAGNOSIS — B379 Candidiasis, unspecified: Secondary | ICD-10-CM | POA: Diagnosis not present

## 2016-07-01 MED ORDER — DOXYCYCLINE HYCLATE 100 MG PO CAPS
100.0000 mg | ORAL_CAPSULE | Freq: Two times a day (BID) | ORAL | 0 refills | Status: DC
Start: 2016-07-01 — End: 2016-07-12

## 2016-07-01 MED ORDER — FLUCONAZOLE 150 MG PO TABS: 150.0000 mg | ORAL_TABLET | Freq: Once | ORAL | 0 refills | Status: AC

## 2016-07-01 NOTE — Progress Notes (Signed)
[name]  MRN: [id number] DOB: [birth year]  Subjective:  Catherine Cole is a 61 y.o. female seen in office today for a chief complaint cyst on nose x 2 days. Has associated discomfort and redness. Denies fever, chills, diaphoresis, purulent drainage, and eye pain. She wears glasses and notes they typically rub this area but cannot think of anything else that would have irritated it. Has tried warm compresses with mild relief as she notes they cyst has finally come to a head. She has no hx of MRSA.   Review of Systems  Eyes: Negative for photophobia, redness, itching and visual disturbance.  Gastrointestinal: Negative for nausea and vomiting.    Patient Active Problem List   Diagnosis Date Noted  . Essential hypertension, benign 10/15/2015  . Kidney stone 05/02/2015  . Palpitations 02/08/2015  . Atypical chest pain 02/08/2015  . Exertional dyspnea 02/08/2015  . Lower extremity edema 02/08/2015  . Mixed dyslipidemia 03/26/2014  . IBS (irritable bowel syndrome) 04/03/2013  . Depression with anxiety 04/26/2012  . ADHD (attention deficit hyperactivity disorder) 04/26/2012    Current Outpatient Prescriptions on File Prior to Visit  Medication Sig Dispense Refill  . amphetamine-dextroamphetamine (ADDERALL) 10 MG tablet Take 1 tablet (10 mg total) by mouth 2 (two) times daily. 60 tablet 0  . amphetamine-dextroamphetamine (ADDERALL) 10 MG tablet Take 1 tablet (10 mg total) by mouth 2 (two) times daily. 60 tablet 0  . amphetamine-dextroamphetamine (ADDERALL) 10 MG tablet Take 1 tablet (10 mg total) by mouth 2 (two) times daily. 60 tablet 0  . diphenoxylate-atropine (LOMOTIL) 2.5-0.025 MG tablet Take 1 tablet by mouth 4 (four) times daily as needed for diarrhea or loose stools. 30 tablet 0  . escitalopram (LEXAPRO) 10 MG tablet Take 3 tablets (30 mg total) by mouth daily. 270 tablet 3  . hydrochlorothiazide (HYDRODIURIL) 25 MG tablet Take 1 tablet (25 mg total) by mouth daily. 90  tablet 1  . Incontinence Supply Disposable (DEPEND EASY FIT UNDERGARMENTS) MISC Use daily as directed. Change several times daily as needed. 120 each 99  . Melatonin 5 MG TABS Take 1 tablet (5 mg total) by mouth at bedtime. 90 tablet 3  . Multiple Vitamin (MULTIVITAMIN WITH MINERALS) TABS tablet Take 1 tablet by mouth daily.    . valACYclovir (VALTREX) 500 MG tablet TAKE 1 TABLET BY MOUTH EVERY DAY OR AS DIRECTED. 30 tablet 0   No current facility-administered medications on file prior to visit.     Allergies  Allergen Reactions  . Demerol Nausea And Vomiting  . Codeine Nausea And Vomiting  . Erythromycin Itching and Rash  . Neosporin [Neomycin-Polymyxin B Gu] Itching and Rash     Objective:  BP 138/80   Pulse 81   Temp 98.5 F (36.9 C) (Oral)   Resp 16   Ht 5\' 6"  (1.676 m)   Wt 188 lb (85.3 kg)   SpO2 96%   BMI 30.34 kg/m   Physical Exam  Constitutional: She is oriented to person, place, and time and well-developed, well-nourished, and in no distress.  HENT:  Head: Normocephalic and atraumatic.  Nose:    Eyes: Conjunctivae, EOM and lids are normal. Pupils are equal, round, and reactive to light.  Neck: Normal range of motion.  Pulmonary/Chest: Effort normal.  Neurological: She is alert and oriented to person, place, and time. Gait normal.  Skin: Skin is warm and dry.  Psychiatric: Affect normal.  Vitals reviewed.  PROCEDURE NOTE: Incision and Drainage Verbal consent obtained.  Area cleansed with alcohol pad An 18 gauge needed was inserted superficially at the central punctate. Copious amounts of purulent drainage lightly expressed. Wound culture obtained. Cleansed and dressed.  Assessment and Plan :  1. Abscess Wound care instructions given to patient. Encouraged to continue warm compresses daily. Instructed to return to clinic if symptoms worsen or as needed - WOUND CULTURE - doxycycline (VIBRAMYCIN) 100 MG capsule; Take 1 capsule (100 mg total) by mouth 2 (two)  times daily.  Dispense: 20 capsule; Refill: 0  2. Antibiotic-induced yeast infection - fluconazole (DIFLUCAN) 150 MG tablet; Take 1 tablet (150 mg total) by mouth once.  Dispense: 1 tablet; Refill: 0  Tenna Delaine PA-C  [facility] 07/01/2016 11:55 AM

## 2016-07-01 NOTE — Patient Instructions (Addendum)
WOUND CARE -Take antibiotics as prescribed for the full course and continue daily warm compresses. I recommend applying a warm compress for 20 min at a time 4-5 x a day.  Marland Kitchen Keep area clean and dry for 24 hours. Do not remove bandage, if applied. . After 24 hours, remove bandage and wash wound gently with mild soap and warm water. Reapply a new bandage after cleaning wound, if directed. . Do not apply any ointments or creams.  . Notify the office if you experience any of the following signs of infection: Swelling, redness, pus drainage, streaking, fever >101.0 F . Notify the office if you experience excessive bleeding that does not stop after 15-20 minutes of constant, firm pressure.    IF you received an x-ray today, you will receive an invoice from [facility]. Please contact [facility] at [phone] with questions or concerns regarding your invoice.   IF you received labwork today, you will receive an invoice from [place]. Please contact [facility] at [phone] with questions or concerns regarding your invoice.   Our billing staff will not be able to assist you with questions regarding bills from these companies.  You will be contacted with the lab results as soon as they are available. The fastest way to get your results is to activate your My Chart account. Instructions are located on the last page of this paperwork. If you have not heard from [place] regarding the results in 2 weeks, please contact this office.

## 2016-07-04 LAB — WOUND CULTURE: Organism ID, Bacteria: NONE SEEN

## 2016-07-06 ENCOUNTER — Telehealth: Payer: Self-pay | Admitting: Emergency Medicine

## 2016-07-06 NOTE — Telephone Encounter (Signed)
-----   Message from Leonie Douglas, PA-C sent at 07/05/2016 12:32 PM EDT ----- Please call pt and report her culture grew staph epidermis which is part of the normal human flora and is suscptable to the antibiotic she was placed on. Please complete antibiotic course. Thanks!

## 2016-07-12 ENCOUNTER — Ambulatory Visit (INDEPENDENT_AMBULATORY_CARE_PROVIDER_SITE_OTHER): Payer: BC Managed Care – PPO | Admitting: Family Medicine

## 2016-07-12 ENCOUNTER — Encounter: Payer: Self-pay | Admitting: Family Medicine

## 2016-07-12 VITALS — BP 145/83 | HR 90 | Temp 98.5°F | Resp 16 | Ht 66.0 in | Wt 188.0 lb

## 2016-07-12 DIAGNOSIS — D1800 Hemangioma unspecified site: Secondary | ICD-10-CM

## 2016-07-12 DIAGNOSIS — H00036 Abscess of eyelid left eye, unspecified eyelid: Secondary | ICD-10-CM

## 2016-07-12 MED ORDER — MUPIROCIN 2 % EX OINT: 1.0000 "application " | TOPICAL_OINTMENT | Freq: Four times a day (QID) | CUTANEOUS | 1 refills | Status: DC

## 2016-07-12 NOTE — Progress Notes (Signed)
Subjective:    Patient ID: Catherine Cole, female    DOB: [birth year], 61 y.o.   MRN: [id number] Chief Complaint  Patient presents with  . Follow-up  . incision    on nose that is not healing well    HPI  [name] is a 61 yo woman who is to follow up on her last visit 10d prior. 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 She is still undergoing laser treatments to improve the discoloration. She had swelling and drainage from the area when she was seen 10d ago by [employer]. It appeared to be a cyst which was punctured with an 18g needle and copious purulence   She was put on doxycycline 100 bid x 10d. Clx grew staph epidermidis that was pan-sensitive. Pt doing well until this morning she noticed she was able to milk a bit more drainage from the site.  Past Medical History:  Diagnosis Date  . Adult ADHD   . Anemia   . Anxiety   . Depression   . Hypertension   . IBS (irritable bowel syndrome)    Past Surgical History:  Procedure Laterality Date  . APPENDECTOMY     12 yrs  . CHOLECYSTECTOMY  1995  . CHOLESTEATOMA EXCISION     13 and 22 yrs  . FRACTURE SURGERY    . [place]  . 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 TONSILLECTOMY     15 yrs   Current Outpatient Prescriptions on File Prior to Visit  Medication Sig Dispense Refill  . amphetamine-dextroamphetamine (ADDERALL) 10 MG tablet Take 1 tablet (10 mg total) by mouth 2 (two) times daily. 60 tablet 0  . amphetamine-dextroamphetamine (ADDERALL) 10 MG tablet Take 1 tablet (10 mg total) by mouth 2 (two) times daily. 60 tablet 0  . amphetamine-dextroamphetamine (ADDERALL) 10 MG tablet Take 1 tablet (10 mg total) by mouth 2 (two) times daily. 60 tablet 0  . B Complex Vitamins  (VITAMIN B COMPLEX) TABS Take by mouth.    . diphenoxylate-atropine (LOMOTIL) 2.5-0.025 MG tablet Take 1 tablet by mouth 4 (four) times daily as needed for diarrhea or loose stools. 30 tablet 0  . escitalopram (LEXAPRO) 10 MG tablet Take 3 tablets (30 mg total) by mouth daily. 270 tablet 3  . hydrochlorothiazide (HYDRODIURIL) 25 MG tablet Take 1 tablet (25 mg total) by mouth daily. 90 tablet 1  . Incontinence Supply Disposable (DEPEND EASY FIT UNDERGARMENTS) MISC Use daily as directed. Change several times daily as needed. 120 each 99  . Melatonin 5 MG TABS Take 1 tablet (5 mg total) by mouth at bedtime. 90 tablet 3  . Multiple Vitamin (MULTIVITAMIN WITH MINERALS) TABS tablet Take 1 tablet by mouth daily.    . valACYclovir (VALTREX) 500 MG tablet TAKE 1 TABLET BY MOUTH EVERY DAY OR AS DIRECTED. 30 tablet 0   No current facility-administered medications on file prior to visit.    Allergies  Allergen Reactions  . Demerol Nausea And Vomiting  . Codeine Nausea And Vomiting  . Erythromycin Itching and Rash  . Neosporin [Neomycin-Polymyxin B Gu] Itching and Rash   Family History  Problem Relation Age of Onset  . Heart disease Mother 80    Heart attack  . Alcohol  abuse Father   . Diabetes Father    Social History   Social History  . Marital status: Widowed    Spouse name: N/A  . Number of children: N/A  . Years of education: N/A   Social History Main Topics  . Smoking status: Former Research scientist (life sciences)  . Smokeless tobacco: Never Used  . Alcohol use No  . Drug use: No  . Sexual activity: No   Other Topics Concern  . None   Social History Narrative  . None   Depression screen [facility] 2/9 07/12/2016 07/12/2016 07/01/2016 05/20/2016 02/17/2016  Decreased Interest 0 0 0 0 0  Down, Depressed, Hopeless 3 0 0 0 0  PHQ - 2 Score 3 0 0 0 0  Altered sleeping 0 - - - -  Tired, decreased energy 0 - - - -  Change in appetite 0 - - - -  Feeling bad or failure about yourself  0 - - - -  Trouble  concentrating 0 - - - -  Moving slowly or fidgety/restless 0 - - - -  Suicidal thoughts 0 - - - -  PHQ-9 Score 3 - - - -  Difficult doing work/chores - - - - -      Review of Systems  Constitutional: Negative for activity change, appetite change, chills and fever.  HENT: Positive for dental problem and facial swelling. Negative for congestion, ear discharge, mouth sores, nosebleeds, postnasal drip, rhinorrhea, sinus pain, sinus pressure and sneezing.   Skin: Positive for color change and wound. Negative for pallor.  Hematological: Negative for adenopathy. Does not bruise/bleed easily.       Objective:   Physical Exam  Constitutional: She is oriented to person, place, and time. She appears well-developed and well-nourished. No distress.  HENT:  Head: Normocephalic and atraumatic.  Right Ear: External ear normal.  Nose: No sinus tenderness.    Bluish discoloration over nasal bridge and extending to the left inner lower epicanthal fold.  Area of slight induration approx 3-4 mm milked with a scant amount of green serous discharge expressed. No warmth, erythema, or tenderness  Eyes: Conjunctivae are normal. No scleral icterus.  Pulmonary/Chest: Effort normal.  Neurological: She is alert and oriented to person, place, and time.  Skin: Skin is warm and dry. She is not diaphoretic. No erythema.  Psychiatric: She has a normal mood and affect. Her behavior is normal.      BP (!) 145/83 (BP Location: Right Arm, Patient Position: Sitting, Cuff Size: Small)   Pulse 90   Temp 98.5 F (36.9 C) (Oral)   Resp 16   Ht 5\' 6"  (1.676 m)   Wt 188 lb (85.3 kg)   SpO2 96%   BMI 30.34 kg/m     Assessment & Plan:   1. Abscess of left eyelid   2. Hemangioma   Cont warm compresses and try topical abx qid.   Orders Placed This Encounter  Procedures  . WOUND CULTURE    Order Specific Question:   Source    Answer:   left upper nasal bridge    Meds ordered this encounter  Medications  .  mupirocin ointment (BACTROBAN) 2 %    Sig: Apply 1 application topically 4 (four) times daily.    Dispense:  30 g    Refill:  1     [name], M.D.  Primary Care at [facility] [address] [place], [place] [address] [phone] phone [phone] fax  07/12/16 6:00 PM

## 2016-07-12 NOTE — Patient Instructions (Addendum)
     IF you received an x-ray today, you will receive an invoice from [facility]. Please contact [facility] at [phone] with questions or concerns regarding your invoice.   IF you received labwork today, you will receive an invoice from [facility]. Please contact [facility] at [phone] with questions or concerns regarding your invoice.   Our billing staff will not be able to assist you with questions regarding bills from these companies.  You will be contacted with the lab results as soon as they are available. The fastest way to get your results is to activate your My Chart account. Instructions are located on the last page of this paperwork. If you have not heard from us regarding the results in 2 weeks, please contact this office.     Incision and Drainage, Care After Refer to this sheet in the next few weeks. These instructions provide you with information about caring for yourself after your procedure. Your health care provider may also give you more specific instructions. Your treatment has been planned according to current medical practices, but problems sometimes occur. Call your health care provider if you have any problems or questions after your procedure. What can I expect after the procedure? After the procedure, it is common to have:  Pain or discomfort around your incision site.  Drainage from your incision.  Follow these instructions at home:  Take over-the-counter and prescription medicines only as told by your health care provider.  If you were prescribed an antibiotic medicine, take it as told by your health care provider.Do not stop taking the antibiotic even if you start to feel better.  Followinstructions from your health care provider about: ? How to take care of your incision. ? When and how you should change your packing and bandage (dressing). Wash your hands with soap and water before you change your dressing. If soap and water are  not available, use hand sanitizer. ? When you should remove your dressing.  Do not take baths, swim, or use a hot tub until your health care provider approves.  Keep all follow-up visits as told by your health care provider. This is important.  Check your incision area every day for signs of infection. Check for: ? More redness, swelling, or pain. ? More fluid or blood. ? Warmth. ? Pus or a bad smell. Contact a health care provider if:  Your cyst or abscess returns.  You have a fever.  You have more redness, swelling, or pain around your incision.  You have more fluid or blood coming from your incision.  Your incision feels warm to the touch.  You have pus or a bad smell coming from your incision. Get help right away if:  You have severe pain or bleeding.  You cannot eat or drink without vomiting.  You have decreased urine output.  You become short of breath.  You have chest pain.  You cough up blood.  The area where the incision and drainage occurred becomes numb or it tingles. This information is not intended to replace advice given to you by your health care provider. Make sure you discuss any questions you have with your health care provider. Document Released: 06/28/2011 Document Revised: 09/05/2015 Document Reviewed: 01/24/2015 Elsevier Interactive Patient Education  2017 Elsevier Inc.  

## 2016-07-15 LAB — GRAM STAIN: Organism ID, Bacteria: NONE SEEN

## 2016-07-15 LAB — UPPER RESPIRATORY CULTURE, ROUTINE

## 2016-07-15 LAB — TEST CODE CHANGE

## 2016-08-26 ENCOUNTER — Encounter: Payer: Self-pay | Admitting: Family Medicine

## 2016-08-26 ENCOUNTER — Ambulatory Visit (INDEPENDENT_AMBULATORY_CARE_PROVIDER_SITE_OTHER): Payer: BC Managed Care – PPO | Admitting: Family Medicine

## 2016-08-26 VITALS — BP 144/74 | HR 91 | Temp 97.9°F | Resp 16 | Ht 66.0 in | Wt 185.2 lb

## 2016-08-26 DIAGNOSIS — I10 Essential (primary) hypertension: Secondary | ICD-10-CM

## 2016-08-26 DIAGNOSIS — K589 Irritable bowel syndrome without diarrhea: Secondary | ICD-10-CM | POA: Diagnosis not present

## 2016-08-26 DIAGNOSIS — F909 Attention-deficit hyperactivity disorder, unspecified type: Secondary | ICD-10-CM | POA: Diagnosis not present

## 2016-08-26 MED ORDER — AMPHETAMINE-DEXTROAMPHETAMINE 10 MG PO TABS: ORAL_TABLET | ORAL | 0 refills | Status: DC

## 2016-08-26 MED ORDER — LISINOPRIL-HYDROCHLOROTHIAZIDE 20-25 MG PO TABS: 1.0000 | ORAL_TABLET | Freq: Every day | ORAL | 1 refills | Status: DC

## 2016-08-26 NOTE — Patient Instructions (Signed)
     IF you received an x-ray today, you will receive an invoice from [facility]. Please contact [facility] at [phone] with questions or concerns regarding your invoice.   IF you received labwork today, you will receive an invoice from [facility]. Please contact [facility] at [phone] with questions or concerns regarding your invoice.   Our billing staff will not be able to assist you with questions regarding bills from these companies.  You will be contacted with the lab results as soon as they are available. The fastest way to get your results is to activate your My Chart account. Instructions are located on the last page of this paperwork. If you have not heard from us regarding the results in 2 weeks, please contact this office.     

## 2016-08-26 NOTE — Progress Notes (Signed)
Subjective:    Patient ID: Catherine Cole, female    DOB: Feb 25, 1956, 61 y.o.   MRN: [id number] Chief Complaint  Patient presents with  . Follow-up  . Hypertension    her for follow up on her blood pressure    HPI  Catherine Cole is a 61 yo woman here today for a follow-up on her chronic medical conditions and medication refills.  Has been under a tons of stress at work - very demending.  SHe is noticing that her mind is all over the place and wondering about seeing a counselor/someone to talk to - tries to stay focused but she can't get over the hump - house is a mess.   ADD: on Adderall 10 bid. We had tried to switch pt to addreall XR 30mg . Was on ritalin prior. Was having some elevated blood pressure on higher dose prior. Does not take stimulant on the weekends and somedays she just takes morning dose when she forgets to take the afternoon dose as she gets to busy. Gets overwhelmed and shut down.  Her memory is fantastic but ever since she fell she has had trouble remember certain things but is rare.  She does spread sheets at work and she will sometimes catches errors that she wouldn't have made before.  Trying to be proactive with prayer and positive thought.  She has trouble getting out of bed even when she is already awake in the morning. She got a new computer at work and has been able to use it more effectively that she expected herself to be able to. SHe his having a lot of turmoil in her hdreams.  She is not forgetting to take   Urinary incontinence: She has been having difficulty emptying her bladder/ intermittent frequency as well as occasional right lower quadrant pain which has been ocurring "randomly". She does have bladder incontinence if she waits too long to go to the restroom and keeps adult depends close by to her. Patient had an ultrasound completed for this and was referred to a urologist but could not be seen by them as she had a balance to pay.  Nocturia of  1-2x/night.  HTN: hctz 25 - did try change her diet and has had some cramping with this (though with stress) and perhaps that is making her go up.  Mood d/o: lexapo 10. Has been on prozac and wellbutrin prior.  IBS-D: pn lomotil really depent on her diet like coffee. Uses the depends for this.  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    Review of Systems     Objective:   Physical Exam       BP (!) 144/74   Pulse 91   Temp 97.9 F (36.6 C) (Oral)   Resp 16   Ht 5\' 6"  (1.676 m)   Wt 185 lb 3.2 oz (84 kg)   SpO2 95%   BMI 29.89 kg/m   Assessment & Plan:   1. Irritable bowel syndrome, unspecified type   2. Essential hypertension, benign   3. Attention deficit hyperactivity disorder (ADHD), unspecified ADHD type     No orders of the defined types were placed in this encounter.   Meds ordered this encounter  Medications  . amphetamine-dextroamphetamine (ADDERALL) 10 MG tablet    Sig: Take 2 tabs in the morning and 1 tab with lunch    Dispense:  90 tablet    Refill:  0    May fill 60 days from  date written  . amphetamine-dextroamphetamine (ADDERALL) 10 MG tablet    Sig: Take 2 tabs in the morning and 1 tab with lunch    Dispense:  90 tablet    Refill:  0    May fill 30 days after date wrriten  . amphetamine-dextroamphetamine (ADDERALL) 10 MG tablet    Sig: Take 2 tabs in the morning and 1 tab in the afternoon    Dispense:  90 tablet    Refill:  0  . lisinopril-hydrochlorothiazide (PRINZIDE,ZESTORETIC) 20-25 MG tablet    Sig: Take 1 tablet by mouth daily.    Dispense:  90 tablet    Refill:  1      [name], M.D.  Primary Care at [facility] [address] [place], [place] [address] [phone] phone [phone] fax  08/28/16 11:30 PM

## 2016-11-18 ENCOUNTER — Telehealth: Payer: Self-pay | Admitting: Family Medicine

## 2016-11-18 NOTE — Telephone Encounter (Signed)
LMOM TO CALL BACK TO RESCHEDULE APPOINTMENT THAT WAS WITH [facility] ON 11-29-16 

## 2016-11-29 ENCOUNTER — Ambulatory Visit: Payer: BC Managed Care – PPO | Admitting: Family Medicine

## 2016-12-15 ENCOUNTER — Ambulatory Visit (INDEPENDENT_AMBULATORY_CARE_PROVIDER_SITE_OTHER): Payer: BC Managed Care – PPO | Admitting: Family Medicine

## 2016-12-15 ENCOUNTER — Encounter: Payer: Self-pay | Admitting: Family Medicine

## 2016-12-15 VITALS — BP 119/76 | HR 93 | Temp 98.2°F | Resp 18 | Ht 66.0 in | Wt 189.4 lb

## 2016-12-15 DIAGNOSIS — F909 Attention-deficit hyperactivity disorder, unspecified type: Secondary | ICD-10-CM

## 2016-12-15 DIAGNOSIS — Z23 Encounter for immunization: Secondary | ICD-10-CM | POA: Diagnosis not present

## 2016-12-15 DIAGNOSIS — F418 Other specified anxiety disorders: Secondary | ICD-10-CM

## 2016-12-15 DIAGNOSIS — I10 Essential (primary) hypertension: Secondary | ICD-10-CM | POA: Diagnosis not present

## 2016-12-15 MED ORDER — AMPHETAMINE-DEXTROAMPHETAMINE 10 MG PO TABS: ORAL_TABLET | ORAL | 0 refills | Status: DC

## 2016-12-15 MED ORDER — HYDROCHLOROTHIAZIDE 25 MG PO TABS: 25.0000 mg | ORAL_TABLET | Freq: Every day | ORAL | 3 refills | Status: DC

## 2016-12-15 MED ORDER — ESCITALOPRAM OXALATE 10 MG PO TABS: 30.0000 mg | ORAL_TABLET | Freq: Every day | ORAL | 3 refills | Status: DC

## 2016-12-15 NOTE — Progress Notes (Addendum)
Subjective:  By signing my name below, I, [name], attest that this documentation has been prepared under the direction and in the presence of [name], MD Electronically Signed: Ladene Artist, ED Scribe 12/15/2016 at 6:31 PM.   Patient ID: Catherine Cole, female    DOB: 1955-05-27, 61 y.o.   MRN: [id number]  Chief Complaint  Patient presents with  . Medication Refill    Adderall, Lexapro and bp med   HPI  Catherine Cole is a 61 y.o. female who presents to Primary Care at [facility] for a medication refill of Adderall, Lexapro and BP medications. Reports side-effect of cough with Lisinopril-HCTZ so she wants to be switched to just HCTZ.  Anxiety  Pt has had a lot going on at the office so she has started exercising as a result of panic attacks. States Adderall has worked well for her; she takes 2 in the morning and 1 in the afternoon. States she has been experiencing a lot of stress and anxiety since going through safety training since May. Pt states that it got to a point where she had no desire to do laundry for at least a month. She further reports that she was mentally and emotionally drained and would just come home and go to bed. Pt takes Melatonin at night PRN to assist with sleep. She was given the number to a state assistance program that she can call 24/7 but has not utilized it yet.  Past Medical History:  Diagnosis Date  . Adult ADHD   . Anemia   . Anxiety   . Depression   . Hypertension   . IBS (irritable bowel syndrome)    Current Outpatient Prescriptions on File Prior to Visit  Medication Sig Dispense Refill  . amphetamine-dextroamphetamine (ADDERALL) 10 MG tablet Take 2 tabs in the morning and 1 tab with lunch 90 tablet 0  . amphetamine-dextroamphetamine (ADDERALL) 10 MG tablet Take 2 tabs in the morning and 1 tab with lunch 90 tablet 0  . amphetamine-dextroamphetamine (ADDERALL) 10 MG tablet Take 2 tabs in the morning and 1 tab in the afternoon 90 tablet 0  .  escitalopram (LEXAPRO) 10 MG tablet Take 3 tablets (30 mg total) by mouth daily. 270 tablet 3  . Melatonin 5 MG TABS Take 1 tablet (5 mg total) by mouth at bedtime. 90 tablet 3  . valACYclovir (VALTREX) 500 MG tablet TAKE 1 TABLET BY MOUTH EVERY DAY OR AS DIRECTED. 30 tablet 0  . B Complex Vitamins (VITAMIN B COMPLEX) TABS Take by mouth.    . diphenoxylate-atropine (LOMOTIL) 2.5-0.025 MG tablet Take 1 tablet by mouth 4 (four) times daily as needed for diarrhea or loose stools. (Patient not taking: Reported on 12/15/2016) 30 tablet 0  . Incontinence Supply Disposable (DEPEND EASY FIT UNDERGARMENTS) MISC Use daily as directed. Change several times daily as needed. 120 each 99  . lisinopril-hydrochlorothiazide (PRINZIDE,ZESTORETIC) 20-25 MG tablet Take 1 tablet by mouth daily. (Patient not taking: Reported on 12/15/2016) 90 tablet 1  . Multiple Vitamin (MULTIVITAMIN WITH MINERALS) TABS tablet Take 1 tablet by mouth daily.     No current facility-administered medications on file prior to visit.    Allergies  Allergen Reactions  . Demerol Nausea And Vomiting  . Codeine Nausea And Vomiting  . Erythromycin Itching and Rash  . Neosporin [Neomycin-Polymyxin B Gu] Itching and Rash   Past Surgical History:  Procedure Laterality Date  . APPENDECTOMY     12 yrs  . CHOLECYSTECTOMY  [place]     13 and 22 yrs  . FRACTURE SURGERY    . [place]  . 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 TONSILLECTOMY     15 yrs   Social History   Social History  . Marital status: Widowed    Spouse name: N/A  . Number of children: N/A  . Years of education: N/A   Social History Main Topics  . Smoking status: Former Research scientist (life sciences)  . Smokeless tobacco: Never Used  . Alcohol use No  . Drug use: No  . Sexual  activity: No   Other Topics Concern  . None   Social History Narrative  . None   Family History  Problem Relation Age of Onset  . Heart disease Mother 19       Heart attack  . Alcohol abuse Father   . Diabetes Father    Depression screen [facility] 2/9 08/26/2016 07/12/2016 07/12/2016 07/01/2016 05/20/2016  Decreased Interest 0 0 0 0 0  Down, Depressed, Hopeless 0 3 0 0 0  PHQ - 2 Score 0 3 0 0 0  Altered sleeping - 0 - - -  Tired, decreased energy - 0 - - -  Change in appetite - 0 - - -  Feeling bad or failure about yourself  - 0 - - -  Trouble concentrating - 0 - - -  Moving slowly or fidgety/restless - 0 - - -  Suicidal thoughts - 0 - - -  PHQ-9 Score - 3 - - -  Difficult doing work/chores - - - - -    Review of Systems  Respiratory: Positive for cough.   Psychiatric/Behavioral: The patient is nervous/anxious.       Objective:   Physical Exam  Constitutional: She is oriented to person, place, and time. She appears well-developed and well-nourished. No distress.  HENT:  Head: Normocephalic and atraumatic.  Eyes: Conjunctivae and EOM are normal.  Neck: Neck supple. No tracheal deviation present.  Cardiovascular: Normal rate, regular rhythm and normal heart sounds.   Pulmonary/Chest: Effort normal and breath sounds normal. No respiratory distress.  Musculoskeletal: Normal range of motion.  Neurological: She is alert and oriented to person, place, and time.  Skin: Skin is warm and dry.  Psychiatric: She has a normal mood and affect. Her behavior is normal.  Nursing note and vitals reviewed.  BP 119/76 (BP Location: Left Arm, Patient Position: Sitting, Cuff Size: Large)   Pulse 93   Temp 98.2 F (36.8 C) (Oral)   Resp 18   Ht 5\' 6"  (1.676 m)   Wt 189 lb 6.4 oz (85.9 kg)   SpO2 97%   BMI 30.57 kg/m     Assessment & Plan:   1. Essential hypertension, benign - having cough with lisinopril so not taking lisinopril-hctz 20-25 - bp suprisingly well controlled today. Restart  hctz 25  2. Need for prophylactic vaccination and inoculation against influenza   3. Attention deficit hyperactivity disorder (ADHD), unspecified ADHD type - refill adderall - doing well on current regimen  4. Depression with anxiety - pt agrees to call EAP therapist. Cont lexapro 30.      Orders Placed This Encounter  Procedures  . Flu Vaccine QUAD 6+ mos PF IM (Fluarix Quad PF)    Meds ordered this  encounter  Medications  . hydrochlorothiazide (HYDRODIURIL) 25 MG tablet    Sig: Take 1 tablet (25 mg total) by mouth daily.    Dispense:  90 tablet    Refill:  3  . amphetamine-dextroamphetamine (ADDERALL) 10 MG tablet    Sig: Take 2 tabs in the morning and 1 tab with lunch    Dispense:  90 tablet    Refill:  0    May fill 60 days from date written  . amphetamine-dextroamphetamine (ADDERALL) 10 MG tablet    Sig: Take 2 tabs in the morning and 1 tab with lunch    Dispense:  90 tablet    Refill:  0    May fill 30 days after date wrriten  . amphetamine-dextroamphetamine (ADDERALL) 10 MG tablet    Sig: Take 2 tabs in the morning and 1 tab in the afternoon    Dispense:  90 tablet    Refill:  0  . escitalopram (LEXAPRO) 10 MG tablet    Sig: Take 3 tablets (30 mg total) by mouth daily.    Dispense:  270 tablet    Refill:  3    I personally performed the services described in this documentation, which was scribed in my presence. The recorded information has been reviewed and considered, and addended by me as needed.   [name], M.D.  Primary Care at [facility] [address] [place], [place] [address] [phone] phone [phone] fax  12/19/16 2:48 AM

## 2017-02-28 ENCOUNTER — Encounter: Payer: Self-pay | Admitting: Family Medicine

## 2017-02-28 ENCOUNTER — Ambulatory Visit: Payer: BC Managed Care – PPO | Admitting: Family Medicine

## 2017-02-28 ENCOUNTER — Other Ambulatory Visit: Payer: Self-pay

## 2017-02-28 VITALS — BP 159/93 | HR 84 | Temp 98.2°F | Resp 16 | Ht 66.0 in | Wt 193.0 lb

## 2017-02-28 DIAGNOSIS — I10 Essential (primary) hypertension: Secondary | ICD-10-CM | POA: Diagnosis not present

## 2017-02-28 DIAGNOSIS — F909 Attention-deficit hyperactivity disorder, unspecified type: Secondary | ICD-10-CM

## 2017-02-28 DIAGNOSIS — F418 Other specified anxiety disorders: Secondary | ICD-10-CM | POA: Diagnosis not present

## 2017-02-28 MED ORDER — LOSARTAN POTASSIUM-HCTZ 50-12.5 MG PO TABS: 1.0000 | ORAL_TABLET | Freq: Every day | ORAL | 1 refills | Status: DC

## 2017-02-28 MED ORDER — HYDROCHLOROTHIAZIDE 25 MG PO TABS: 25.0000 mg | ORAL_TABLET | Freq: Every day | ORAL | 3 refills | Status: DC

## 2017-02-28 MED ORDER — ARIPIPRAZOLE 5 MG PO TABS: 5.0000 mg | ORAL_TABLET | Freq: Every day | ORAL | 0 refills | Status: DC

## 2017-02-28 MED ORDER — AMPHETAMINE-DEXTROAMPHETAMINE 10 MG PO TABS: ORAL_TABLET | ORAL | 0 refills | Status: DC

## 2017-02-28 NOTE — Progress Notes (Signed)
Subjective:  By signing my name below, I, [name], attest that this documentation has been prepared under the direction and in the presence of [name], MD Electronically Signed: Ladene Artist, ED Scribe 02/28/2017 at 6:31 PM.   Patient ID: Catherine Cole, female    DOB: [birth year], 61 y.o.   MRN: [id number]  Chief Complaint  Patient presents with  . Follow-up    adderall, hydrochlorothiazide,    HPI  Catherine Cole is a 61 y.o. female who presents to Primary Care at [facility] for a medication refill of Adderall, Lexapro and BP medications. Reports side-effect of cough with Lisinopril-HCTZ so she wants to be switched to just HCTZ.  Anxiety  Pt has had a lot going on at the office so she has started exercising as a result of panic attacks. States Adderall has worked well for her; she takes 2 in the morning and 1 in the afternoon. States she has been experiencing a lot of stress and anxiety since going through safety training since May. Pt states that it got to a point where she had no desire to do laundry for at least a month. She further reports that she was mentally and emotionally drained and would just come home and go to bed. Pt takes Melatonin at night PRN to assist with sleep. She was given the number to a state assistance program that she can call 24/7 but has not utilized it yet.  She is stuck - not productive. Alinda Deem phD - ted talks - shame/guilt, psychologist - reading this has helped a lot but knows she is stuck and needs to call the EAP.  Her son Roselyn Reef is [place], [place] but strained relationship w/ his wife. Yolanda Bonine is [place].  She is having a lot of anxiety/perseverating on the difficulties of their relationship and how choices she had to make while raising him are still impacting her.  ADD: on Adderall 10 bid. We had tried to switch pt to addreall XR 30mg . Was on ritalin prior. Was having some elevated blood pressure on higher dose prior. Does not take stimulant on  the weekends and somedays she just takes morning dose when she forgets to take the afternoon dose as she gets to busy. Gets overwhelmed and shut down. Her memory is fantastic but ever since she fell she has had trouble remember certain things but is rare. She does spread sheets at work and she will sometimes catches errors that she wouldn't have made before. Trying to be proactive with prayer and positive thought. She has trouble getting out of bed even when she is already awake in the morning. She got a new computer at work and has been able to use it more effectively that she expected herself to be able to. SHe his having a lot of turmoil in her dreams.  She is not forgetting to take her 2 tabs in the morning and is VERY compliant with her other meds - hctz and lexapro.  Urinary incontinence: She has been having difficulty emptying her bladder/ intermittent frequency as well as occasional right lower quadrant pain which has been ocurring "randomly". She does have bladder incontinence if she waits too long to go to the restroom and keeps adult depends close by to her. Patient had an ultrasound completed for this and was referred to a urologist but could not be seen by them as she had a balance to pay. Nocturia of 1-2x/night.  HTN: hctz 25 - did try change her diet  and has had some cramping with this (though with stress) and perhaps that is making her go up.  Mood d/o: lexapo 30. Has been on prozac and wellbutrin prior.  IBS-D: pn lomotil really depent on her diet like coffee. Uses the depends for this.  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   1. Essential hypertension, benign - having cough with lisinopril so not taking lisinopril-hctz 20-25 - bp suprisingly well controlled today. Restart hctz 25  2. Need for prophylactic vaccination and inoculation against influenza   3. Attention deficit hyperactivity disorder (ADHD), unspecified ADHD type - refill adderall - doing  well on current regimen  4. Depression with anxiety - pt agrees to call EAP therapist. Cont lexapro 30.     Past Medical History:  Diagnosis Date  . Adult ADHD   . Anemia   . Anxiety   . Depression   . Hypertension   . IBS (irritable bowel syndrome)    Current Outpatient Medications on File Prior to Visit  Medication Sig Dispense Refill  . amphetamine-dextroamphetamine (ADDERALL) 10 MG tablet Take 2 tabs in the morning and 1 tab with lunch 90 tablet 0  . amphetamine-dextroamphetamine (ADDERALL) 10 MG tablet Take 2 tabs in the morning and 1 tab with lunch 90 tablet 0  . amphetamine-dextroamphetamine (ADDERALL) 10 MG tablet Take 2 tabs in the morning and 1 tab in the afternoon 90 tablet 0  . B Complex Vitamins (VITAMIN B COMPLEX) TABS Take by mouth.    . escitalopram (LEXAPRO) 10 MG tablet Take 3 tablets (30 mg total) by mouth daily. 270 tablet 3  . hydrochlorothiazide (HYDRODIURIL) 25 MG tablet Take 1 tablet (25 mg total) by mouth daily. 90 tablet 3  . Incontinence Supply Disposable (DEPEND EASY FIT UNDERGARMENTS) MISC Use daily as directed. Change several times daily as needed. 120 each 99  . Melatonin 5 MG TABS Take 1 tablet (5 mg total) by mouth at bedtime. 90 tablet 3  . Multiple Vitamin (MULTIVITAMIN WITH MINERALS) TABS tablet Take 1 tablet by mouth daily.    . valACYclovir (VALTREX) 500 MG tablet TAKE 1 TABLET BY MOUTH EVERY DAY OR AS DIRECTED. 30 tablet 0  . diphenoxylate-atropine (LOMOTIL) 2.5-0.025 MG tablet Take 1 tablet by mouth 4 (four) times daily as needed for diarrhea or loose stools. (Patient not taking: Reported on 02/28/2017) 30 tablet 0   No current facility-administered medications on file prior to visit.    Allergies  Allergen Reactions  . Demerol Nausea And Vomiting  . Codeine Nausea And Vomiting  . Erythromycin Itching and Rash  . Neosporin [Neomycin-Polymyxin B Gu] Itching and Rash   Past Surgical History:  Procedure Laterality Date  . APPENDECTOMY      12 yrs  . CHOLECYSTECTOMY  1995  . CHOLESTEATOMA EXCISION     13 and 22 yrs  . FRACTURE SURGERY    . [place]  . TONSILLECTOMY     15 yrs   Social History   Socioeconomic History  . Marital status: Widowed    Spouse name: None  . Number of children: None  . Years of education: None  . Highest education level: None  Social Needs  . Financial resource strain: None  . Food insecurity - worry: None  . Food insecurity - inability: None  . Transportation needs - medical: None  . Transportation needs - non-medical: None  Occupational History  . None  Tobacco Use  . Smoking status: Former Research scientist (life sciences)  .  Smokeless tobacco: Never Used  Substance and Sexual Activity  . Alcohol use: No  . Drug use: No  . Sexual activity: No  Other Topics Concern  . None  Social History Narrative  . None   Family History  Problem Relation Age of Onset  . Heart disease Mother 19       Heart attack  . Alcohol abuse Father   . Diabetes Father    Depression screen [facility] 2/9 02/28/2017 08/26/2016 07/12/2016 07/12/2016 07/01/2016  Decreased Interest 0 0 0 0 0  Down, Depressed, Hopeless 0 0 3 0 0  PHQ - 2 Score 0 0 3 0 0  Altered sleeping - - 0 - -  Tired, decreased energy - - 0 - -  Change in appetite - - 0 - -  Feeling bad or failure about yourself  - - 0 - -  Trouble concentrating - - 0 - -  Moving slowly or fidgety/restless - - 0 - -  Suicidal thoughts - - 0 - -  PHQ-9 Score - - 3 - -  Difficult doing work/chores - - - - -    Review of Systems  Respiratory: Positive for cough.   Psychiatric/Behavioral: The patient is nervous/anxious.       Objective:   Physical Exam  Constitutional: She is oriented to person, place, and time. She appears well-developed and well-nourished. No distress.  HENT:  Head: Normocephalic and atraumatic.  Eyes: Conjunctivae and EOM are normal.  Neck: Neck supple. No tracheal deviation present.  Cardiovascular: Normal rate, regular rhythm and normal  heart sounds.  Pulmonary/Chest: Effort normal and breath sounds normal. No respiratory distress.  Musculoskeletal: Normal range of motion.  Neurological: She is alert and oriented to person, place, and time.  Skin: Skin is warm and dry.  Psychiatric: She has a normal mood and affect. Her behavior is normal.  Nursing note and vitals reviewed.  BP (!) 159/93   Pulse 84   Temp 98.2 F (36.8 C)   Resp 16   Ht 5\' 6"  (1.676 m)   Wt 193 lb (87.5 kg)   SpO2 96%   BMI 31.15 kg/m     Assessment & Plan:   1. Attention deficit hyperactivity disorder (ADHD), unspecified ADHD type  - refilled same regimen - encouraged pt to set phone alarm to take her afternoon dose and try taking 1-2 tabs qam on wkends/none work days (so gets to skip the afternoon dose on those days) Did not tolerate XR prior and prev on ritalin - could consider trial of vyvanse. We discussed the goal that increased stimulant compliance will help her accomplish some of the things on all the lists she is making during her non-work time which she tells me about every visit (clean/organize her house, call the EAP) - avoiding these things and living in dirty clutter (?developing hoarding?? - wouldn't let her landlord into her new apt due to its condition. . . ) is make her depression and anxiety worse.  Pt agrees to call EAP tomorrow.  2. Depression with anxiety - maxed out on lexapro 30 - pt wants to augment since can't increase - asks about wellbutrin (has been on in past) - will try starting low dose abilify.  3. Essential hypertension, benign - did not take hctz today and ate a lot of salt, no water. When checks outside office has been at goal. Cough w/ lisinopril.   Recheck 3 mos.  Meds ordered this encounter  Medications  . amphetamine-dextroamphetamine (  ADDERALL) 10 MG tablet    Sig: Take 2 tabs in the morning and 1 tab in the afternoon    Dispense:  90 tablet    Refill:  0  . amphetamine-dextroamphetamine (ADDERALL) 10 MG  tablet    Sig: Take 2 tabs in the morning and 1 tab with lunch    Dispense:  90 tablet    Refill:  0    May fill 30 days after date wrriten  . amphetamine-dextroamphetamine (ADDERALL) 10 MG tablet    Sig: Take 2 tabs in the morning and 1 tab with lunch    Dispense:  90 tablet    Refill:  0    May fill 60 days from date written  . ARIPiprazole (ABILIFY) 5 MG tablet    Sig: Take 1 tablet (5 mg total) daily by mouth.    Dispense:  90 tablet    Refill:  0                 . hydrochlorothiazide (HYDRODIURIL) 25 MG tablet    Sig: Take 1 tablet (25 mg total) daily by mouth.    Dispense:  90 tablet    Refill:  3    PLEASE D/C RX SENT OVER FOR LOSARTAN-HCTZ. PT IS TO STAY ON HCTZ 25 FOR BP.   Over 40 min spent in face-to-face evaluation of and consultation with patient and coordination of care.  Over 50% of this time was spent counseling this patient regarding her depression/anxiety surrounding her relationship with her son and his wife and child and their interactions - how she is going to handle the holidays. Approaches she can take to getting her own house in order would then make her feels better about life - she has a cluttered brain so cluttered home is not restful/restorative. Has to contact EAP - she cannot solve her own problems as to close - don't wait until things are uncluttered and done and organized or she will be waiting forever.  [name], M.D.  Primary Care at [facility] [address] [place], [place] [address] [phone] phone [phone] fax  02/28/17 6:22 PM

## 2017-02-28 NOTE — Patient Instructions (Addendum)
   IF you received an x-ray today, you will receive an invoice from [facility]. Please contact [facility] at [phone] with questions or concerns regarding your invoice.   IF you received labwork today, you will receive an invoice from [facility]. Please contact [facility] at [phone] with questions or concerns regarding your invoice.   Our billing staff will not be able to assist you with questions regarding bills from these companies.  You will be contacted with the lab results as soon as they are available. The fastest way to get your results is to activate your My Chart account. Instructions are located on the last page of this paperwork. If you have not heard from us regarding the results in 2 weeks, please contact this office.      Major Depressive Disorder, Adult Major depressive disorder (MDD) is a mental health condition. MDD often makes you feel sad, hopeless, or helpless. MDD can also cause symptoms in your body. MDD can affect your:  Work.  School.  Relationships.  Other normal activities.  MDD can range from mild to very bad. It may occur once (single episode MDD). It can also occur many times (recurrent MDD). The main symptoms of MDD often include:  Feeling sad, depressed, or irritable most of the time.  Loss of interest.  MDD symptoms also include:  Sleeping too much or too little.  Eating too much or too little.  A change in your weight.  Feeling tired (fatigue) or having low energy.  Feeling worthless.  Feeling guilty.  Trouble making decisions.  Trouble thinking clearly.  Thoughts of suicide or harming others.  Feeling weak.  Feeling agitated.  Keeping yourself from being around other people (isolation).  Follow these instructions at home: Activity  Do these things as told by your doctor: ? Go back to your normal activities. ? Exercise regularly. ? Spend time outdoors. Alcohol  Talk with your doctor about  how alcohol can affect your antidepressant medicines.  Do not drink alcohol. Or, limit how much alcohol you drink. ? This means no more than 1 drink a day for nonpregnant women and 2 drinks a day for men. One drink equals one of these:  12 oz of beer.  5 oz of wine.  1 oz of hard liquor. General instructions  Take over-the-counter and prescription medicines only as told by your doctor.  Eat a healthy diet.  Get plenty of sleep.  Find activities that you enjoy. Make time to do them.  Think about joining a support group. Your doctor may be able to suggest a group for you.  Keep all follow-up visits as told by your doctor. This is important. Where to find more information:  National Alliance on Mental Illness: ? www.nami.org  U.S. National Institute of Mental Health: ? www.nimh.nih.gov  National Suicide Prevention Lifeline: ? 1-800-273-8255. This is free, 24-hour help. Contact a doctor if:  Your symptoms get worse.  You have new symptoms. Get help right away if:  You self-harm.  You see, hear, taste, smell, or feel things that are not present (hallucinate). If you ever feel like you may hurt yourself or others, or have thoughts about taking your own life, get help right away. You can go to your nearest emergency department or call:  Your local emergency services (911 in the [place]  A suicide crisis helpline, such as the National Suicide Prevention Lifeline: ? 1-800-273-8255. This is open 24 hours a day.  This information is not intended to replace   advice given to you by your health care provider. Make sure you discuss any questions you have with your health care provider. Document Released: 03/17/2015 Document Revised: 12/21/2015 Document Reviewed: 12/21/2015 Elsevier Interactive Patient Education  2017 Elsevier Inc.  

## 2017-04-07 ENCOUNTER — Other Ambulatory Visit: Payer: Self-pay | Admitting: Family Medicine

## 2017-04-07 DIAGNOSIS — Z1231 Encounter for screening mammogram for malignant neoplasm of breast: Secondary | ICD-10-CM

## 2017-04-08 ENCOUNTER — Ambulatory Visit
Admission: RE | Admit: 2017-04-08 | Discharge: 2017-04-08 | Disposition: A | Discharge status: Home / Self Care | Payer: BC Managed Care – PPO | Source: Ambulatory Visit | Attending: Family Medicine | Admitting: Family Medicine

## 2017-04-08 DIAGNOSIS — Z1231 Encounter for screening mammogram for malignant neoplasm of breast: Secondary | ICD-10-CM

## 2017-05-02 ENCOUNTER — Ambulatory Visit: Payer: BC Managed Care – PPO | Admitting: Family Medicine

## 2017-05-02 ENCOUNTER — Other Ambulatory Visit: Payer: Self-pay

## 2017-05-02 ENCOUNTER — Encounter: Payer: Self-pay | Admitting: Family Medicine

## 2017-05-02 VITALS — BP 112/68 | HR 94 | Temp 97.8°F | Resp 16 | Ht 66.0 in | Wt 196.6 lb

## 2017-05-02 DIAGNOSIS — I10 Essential (primary) hypertension: Secondary | ICD-10-CM | POA: Diagnosis not present

## 2017-05-02 DIAGNOSIS — F418 Other specified anxiety disorders: Secondary | ICD-10-CM

## 2017-05-02 DIAGNOSIS — L820 Inflamed seborrheic keratosis: Secondary | ICD-10-CM

## 2017-05-02 DIAGNOSIS — F909 Attention-deficit hyperactivity disorder, unspecified type: Secondary | ICD-10-CM

## 2017-05-02 MED ORDER — ARIPIPRAZOLE 5 MG PO TABS: 5.0000 mg | ORAL_TABLET | Freq: Every day | ORAL | 1 refills | Status: DC

## 2017-05-02 MED ORDER — AMPHETAMINE-DEXTROAMPHETAMINE 10 MG PO TABS: ORAL_TABLET | ORAL | 0 refills | Status: DC

## 2017-05-02 MED ORDER — ARIPIPRAZOLE 5 MG PO TABS: 5.0000 mg | ORAL_TABLET | Freq: Every day | ORAL | 11 refills | Status: DC

## 2017-05-02 NOTE — Progress Notes (Addendum)
Subjective:  By signing my name below, I, Moises Blood, attest that this documentation has been prepared under the direction and in the presence of [name], MD. Electronically Signed: Moises Blood, [place]. 05/02/2017 , 6:08 PM .  Patient was seen in Room 3 .   Patient ID: Catherine Cole, female    DOB: 03-03-1956, 62 y.o.   MRN: [id number] Chief Complaint  Patient presents with  . Follow-up    medication    HPI Catherine Cole is a 62 y.o. female who presents to Primary Care at [facility] for follow up of medication. Patient did call the EAP and got the numbers. She's been making sure to take Adderall afternoon dose, with the phone alarm. She states the Abilify has been working well; wasn't able to refill it immediately due to cost, but was able to fill after being paid. Her BP has been running well. She's been able to stay organized. She hasn't been exercising much, going between home and work. Patient had Adderall refilled on 11/12, 12/8, and 1/11.   She mentions having a mole under her left breast when she was younger. Her sister had similar mole, and both had the mole removed. However, patient states her mole has returned, and her bra has been irritating it to the point of some blood. When it was removed previously, it was benign.   Patient states her sister's Juliann Pulse) gall bladder had "exploded" near the holidays.   Past Medical History:  Diagnosis Date  . Adult ADHD   . Anemia   . Anxiety   . Depression   . Hypertension   . IBS (irritable bowel syndrome)    Past Surgical History:  Procedure Laterality Date  . APPENDECTOMY     12 yrs  . CHOLECYSTECTOMY  1995  . CHOLESTEATOMA EXCISION     13 and 22 yrs  . FRACTURE SURGERY    . [place]  . 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 TONSILLECTOMY     15 yrs   Prior to Admission medications   Medication Sig Start Date End Date Taking? Authorizing Provider  amphetamine-dextroamphetamine (ADDERALL) 10 MG tablet Take 2 tabs in the morning and 1 tab in the afternoon 02/28/17   Shawnee Knapp, MD  amphetamine-dextroamphetamine (ADDERALL) 10 MG tablet Take 2 tabs in the morning and 1 tab with lunch 03/28/17   Shawnee Knapp, MD  amphetamine-dextroamphetamine (ADDERALL) 10 MG tablet Take 2 tabs in the morning and 1 tab with lunch 04/25/17   Shawnee Knapp, MD  ARIPiprazole (ABILIFY) 5 MG tablet Take 1 tablet (5 mg total) daily by mouth. 02/28/17   Shawnee Knapp, MD  B Complex Vitamins (VITAMIN B COMPLEX) TABS Take by mouth.    [provider]  escitalopram (LEXAPRO) 10 MG tablet Take 3 tablets (30 mg total) by mouth daily. 12/15/16   Shawnee Knapp, MD  hydrochlorothiazide (HYDRODIURIL) 25 MG tablet Take 1 tablet (25 mg total) daily by mouth. 02/28/17   Shawnee Knapp, MD  Incontinence Supply Disposable (DEPEND EASY FIT UNDERGARMENTS) MISC Use daily as directed. Change several times daily as needed. 05/20/16   Shawnee Knapp, MD  Melatonin 5 MG TABS Take 1 tablet (5 mg total) by mouth at bedtime. 05/01/15   Shawnee Knapp, MD  Multiple  Vitamin (MULTIVITAMIN WITH MINERALS) TABS tablet Take 1 tablet by mouth daily.    [provider]  valACYclovir (VALTREX) 500 MG tablet TAKE 1 TABLET BY MOUTH EVERY DAY OR AS DIRECTED. 09/18/15   Shawnee Knapp, MD   Allergies  Allergen Reactions  . Demerol Nausea And Vomiting  . Codeine Nausea And Vomiting  . Erythromycin Itching and Rash  . Neosporin [Neomycin-Polymyxin B Gu] Itching and Rash   Family History  Problem Relation Age of Onset  . Heart disease Mother 52       Heart attack  . Alcohol abuse Father   . Diabetes Father    Social History   Socioeconomic History  . Marital status: Widowed    Spouse name: None  . Number of children: None  . Years of education: None  . Highest  education level: None  Social Needs  . Financial resource strain: None  . Food insecurity - worry: None  . Food insecurity - inability: None  . Transportation needs - medical: None  . Transportation needs - non-medical: None  Occupational History  . None  Tobacco Use  . Smoking status: Former Research scientist (life sciences)  . Smokeless tobacco: Never Used  Substance and Sexual Activity  . Alcohol use: No  . Drug use: No  . Sexual activity: No  Other Topics Concern  . None  Social History Narrative  . None   Depression screen [facility] 2/9 05/02/2017 02/28/2017 08/26/2016 07/12/2016 07/12/2016  Decreased Interest 0 0 0 0 0  Down, Depressed, Hopeless 0 0 0 3 0  PHQ - 2 Score 0 0 0 3 0  Altered sleeping - - - 0 -  Tired, decreased energy - - - 0 -  Change in appetite - - - 0 -  Feeling bad or failure about yourself  - - - 0 -  Trouble concentrating - - - 0 -  Moving slowly or fidgety/restless - - - 0 -  Suicidal thoughts - - - 0 -  PHQ-9 Score - - - 3 -  Difficult doing work/chores - - - - -    Review of Systems  Constitutional: Negative for chills, fatigue, fever and unexpected weight change.  Respiratory: Negative for cough.   Gastrointestinal: Negative for constipation, diarrhea, nausea and vomiting.  Skin: Negative for rash and wound.       mole  Neurological: Negative for dizziness, weakness and headaches.       Objective:   Physical Exam  Constitutional: She is oriented to person, place, and time. She appears well-developed and well-nourished. No distress.  HENT:  Head: Normocephalic and atraumatic.  Eyes: EOM are normal. Pupils are equal, round, and reactive to light.  Neck: Neck supple. No thyromegaly present.  Cardiovascular: Normal rate.  Pulmonary/Chest: Effort normal and breath sounds normal. No respiratory distress.  There's an irritated seborrheic keratosis, measuring about 2cm in diameter with some fissures under her left medial breast, right in line with her bra underwire    Musculoskeletal: Normal range of motion.  Lymphadenopathy:    She has no cervical adenopathy.  Neurological: She is alert and oriented to person, place, and time.  Skin: Skin is warm and dry.  Psychiatric: She has a normal mood and affect. Her behavior is normal.  Nursing note and vitals reviewed.   BP 112/68   Pulse 94   Temp 97.8 F (36.6 C)   Resp 16   Ht 5\' 6"  (1.676 m)   Wt 196 lb 9.6 oz (89.2 kg)  SpO2 98%   BMI 31.73 kg/m     Liquid nitrogen gas sprayed upon "stuck on" oval lesion under left medial breast directly under her underwire bra so lesion red, raw. Pt tolerated procedure well, no complications. Assessment & Plan:   1. Essential hypertension, benign - much improved  2. Seborrheic keratoses, inflamed - removed w/ cryo today in office  3. Attention deficit hyperactivity disorder (ADHD), unspecified ADHD type - doing better with med compliance which is helping  4. Depression with anxiety - doing better - at least got phone # for therapist from EAP - making progress, cont ability 5 - works well for her    She will be able to fill her Adderall on 2/8, 3/8, and 4/5  Meds ordered this encounter  Medications  . amphetamine-dextroamphetamine (ADDERALL) 10 MG tablet    Sig: Take 2 tabs in the morning and 1 tab in the afternoon    Dispense:  90 tablet    Refill:  0  . amphetamine-dextroamphetamine (ADDERALL) 10 MG tablet    Sig: Take 2 tabs in the morning and 1 tab with lunch    Dispense:  90 tablet    Refill:  0  . amphetamine-dextroamphetamine (ADDERALL) 10 MG tablet    Sig: Take 2 tabs in the morning and 1 tab with lunch    Dispense:  90 tablet    Refill:  0  . DISCONTD: ARIPiprazole (ABILIFY) 5 MG tablet    Sig: Take 1 tablet (5 mg total) by mouth at bedtime.    Dispense:  90 tablet    Refill:  1  . ARIPiprazole (ABILIFY) 5 MG tablet    Sig: Take 1 tablet (5 mg total) by mouth daily.    Dispense:  30 tablet    Refill:  11    I personally performed the  services described in this documentation, which was scribed in my presence. The recorded information has been reviewed and considered, and addended by me as needed.   [name], M.D.  Primary Care at [facility] [address] [place], [place] [address] [phone] phone [phone] fax  05/15/17 11:45 PM

## 2017-05-02 NOTE — Patient Instructions (Addendum)
IF you received an x-ray today, you will receive an invoice from [facility]. Please contact [facility] at [phone] with questions or concerns regarding your invoice.   IF you received labwork today, you will receive an invoice from [facility]. Please contact [facility] at [phone] with questions or concerns regarding your invoice.   Our billing staff will not be able to assist you with questions regarding bills from these companies.  You will be contacted with the lab results as soon as they are available. The fastest way to get your results is to activate your My Chart account. Instructions are located on the last page of this paperwork. If you have not heard from [place] regarding the results in 2 weeks, please contact this office.     Cryosurgery for Skin Conditions, Care After This sheet gives you information about how to care for yourself after your procedure. Your health care provider may also give you more specific instructions. If you have problems or questions, contact your health care provider. What can I expect after the procedure? After your procedure, it is common to have redness, swelling, and a blister that forms over the treated area. The blister may contain a small amount of blood. After about 2 weeks, the blister will break on its own, leaving a scab. Then the treated area will heal. After healing, there is usually little or no scarring. Follow these instructions at home: Caring for the treated area  Follow instructions from your health care provider about how to take care of the treated area. Make sure you: ? Keep the area covered with a bandage (dressing) until it heals, or for as long as told by your health care provider. ? Wash your hands with soap and water before you change your dressing. If soap and water are not available, use hand sanitizer. ? Change your dressing as told by your health care provider. ? Keep the dressing and the treated area  clean and dry. If the dressing gets wet, change it right away. ? Clean the treated area with soap and water.  Check the treated area every day for signs of infection. Check for: ? More redness, swelling, or pain. ? More fluid or blood. ? Warmth. ? Pus or a bad smell. General instructions  Do not pick at your blister or try to break it open. This can cause infection and scarring.  Do not apply any medicine, cream, or lotion to the treated area unless directed by your health care provider.  Take over-the-counter and prescription medicines only as told by your health care provider.  Keep all follow-up visits as told by your health care provider. This is important. Contact a health care provider if:  You have more redness, swelling, or pain around the treated area.  You have more fluid or blood coming from the treated area.  The treated area feels warm to the touch.  You have pus or a bad smell coming from the treated area.  Your blister becomes large and painful. Get help right away if:  You have a fever and have redness spreading from the treated area. Summary  The treated area will become red and swollen shortly after the procedure.  You should keep the treated area and your dressing clean and dry.  Check the treated area every day for signs of infection, such as fluid, pus, warmth, or having more redness, swelling, or pain.  Do not pick at your blister or try to break it open.  This information is not intended to replace advice given to you by your health care provider. Make sure you discuss any questions you have with your health care provider. Document Released: 10/23/2004 Document Revised: 02/23/2016 Document Reviewed: 02/23/2016 Elsevier Interactive Patient Education  2017 Elsevier Inc.  Seborrheic Keratosis Seborrheic keratosis is a common, noncancerous (benign) skin growth. This condition causes waxy, rough, tan, brown, or black spots to appear on the skin. These  skin growths can be flat or raised. What are the causes? The cause of this condition is not known. What increases the risk? This condition is more likely to develop in:  People who have a family history of seborrheic keratosis.  People who are 4 or older.  People who are pregnant.  People who have had estrogen replacement therapy.  What are the signs or symptoms? This condition often occurs on the face, chest, shoulders, back, or other areas. These growths:  Are usually painless, but may become irritated and itchy.  Can be yellow, brown, black, or other colors.  Are slightly raised or have a flat surface.  Are sometimes rough or wart-like in texture.  Are often waxy on the surface.  Are round or oval-shaped.  Sometimes look like they are "stuck on."  Often occur in groups, but may occur as a single growth.  How is this diagnosed? This condition is diagnosed with a medical history and physical exam. A sample of the growth may be tested (skin biopsy). You may need to see a skin specialist (dermatologist). How is this treated? Treatment is not usually needed for this condition, unless the growths are irritated or are often bleeding. You may also choose to have the growths removed if you do not like their appearance. Most commonly, these growths are treated with a procedure in which liquid nitrogen is applied to "freeze" off the growth (cryosurgery). They may also be burned off with electricity or cut off. Follow these instructions at home:  Watch your growth for any changes.  Keep all follow-up visits as told by your health care provider. This is important.  Do not scratch or pick at the growth or growths. This can cause them to become irritated or infected. Contact a health care provider if:  You suddenly have many new growths.  Your growth bleeds, itches, or hurts.  Your growth suddenly becomes larger or changes color. This information is not intended to replace  advice given to you by your health care provider. Make sure you discuss any questions you have with your health care provider. Document Released: 05/08/2010 Document Revised: 09/11/2015 Document Reviewed: 08/21/2014 Elsevier Interactive Patient Education  2018 [employer].  Cryosurgery for Skin Conditions Cryosurgery, also called cryotherapy, is the use of extreme cold to freeze and remove abnormal or diseased tissue. Growths on the skin such as warts, precancerous skin lesions (actinic keratoses), and some skin cancers may be removed with cryosurgery. Cryosurgery usually takes a few minutes, and it can be done in your health care provider's office. Tell a health care provider about:  Any allergies you have.  All medicines you are taking, including vitamins, herbs, eye drops, creams, and over-the-counter medicines.  Any problems you or family members have had with anesthetic medicines.  Any blood disorders you have.  Any surgeries you have had.  Any medical conditions you have. What are the risks? Generally, this is a safe procedure. However, problems may occur, including:  Bleeding.  Scarring.  Changes in skin color (lighter or darker than normal skin  tone).  Swelling.  Hair loss in the treated area.  Nerve damage and loss of feeling (rare).  What happens before the procedure? No specific preparation is necessary for this procedure. What happens during the procedure?  Your procedure will be performed using one of the following methods: ? Your health care provider may apply a device (probe) to the skin. The probe has liquid nitrogen flowing through it to cool it down. The probe will be applied to the skin until the skin is frozen and destroyed. ? Your health care provider may apply liquid nitrogen to the skin with a swab or by spraying it until the skin is frozen and destroyed.  The treated area may be covered with a bandage (dressing). These procedures may vary among  health care providers and clinics. What happens after the procedure?  Shortly after the procedure, the treated area will become red and swollen. A blister may form. Summary  Cryosurgery, also called cryotherapy, is the use of extreme cold to freeze and remove abnormal or diseased tissue.  Cryosurgery usually takes a few minutes, and it can be done in your health care provider's office.  There are two different methods for performing cryosurgery. One method involves using a probe to freeze the growth, and the other method involves applying liquid nitrogen directly to the growth. This information is not intended to replace advice given to you by your health care provider. Make sure you discuss any questions you have with your health care provider. Document Released: 04/02/2000 Document Revised: 02/23/2016 Document Reviewed: 02/23/2016 Elsevier Interactive Patient Education  2017 [employer].

## 2017-06-02 ENCOUNTER — Ambulatory Visit: Payer: BC Managed Care – PPO | Admitting: Family Medicine

## 2017-06-02 ENCOUNTER — Other Ambulatory Visit: Payer: Self-pay

## 2017-06-02 ENCOUNTER — Encounter: Payer: Self-pay | Admitting: Family Medicine

## 2017-06-02 VITALS — BP 138/72 | HR 80 | Temp 98.0°F | Resp 16 | Ht 66.14 in | Wt 192.0 lb

## 2017-06-02 DIAGNOSIS — F418 Other specified anxiety disorders: Secondary | ICD-10-CM

## 2017-06-02 DIAGNOSIS — N644 Mastodynia: Secondary | ICD-10-CM

## 2017-06-02 LAB — POCT CBC
GRANULOCYTE PERCENT: 63.3 % (ref 37–80)
HCT, POC: 38.4 % (ref 37.7–47.9)
Hemoglobin: 13.2 g/dL (ref 12.2–16.2)
LYMPH, POC: 2.4 (ref 0.6–3.4)
MCH, POC: 29.3 pg (ref 27–31.2)
MCHC: 34.5 g/dL (ref 31.8–35.4)
MCV: 84.9 fL (ref 80–97)
MID (cbc): 0.2 (ref 0–0.9)
MPV: 7.3 fL (ref 0–99.8)
PLATELET COUNT, POC: 281 10*3/uL (ref 142–424)
POC Granulocyte: 4.5 (ref 2–6.9)
POC LYMPH PERCENT: 33.6 %L (ref 10–50)
POC MID %: 3.1 %M (ref 0–12)
RBC: 4.52 M/uL (ref 4.04–5.48)
RDW, POC: 13.1 %
WBC: 7.1 10*3/uL (ref 4.6–10.2)

## 2017-06-02 LAB — POCT SEDIMENTATION RATE: POCT SED RATE: 15 mm/h (ref 0–22)

## 2017-06-02 NOTE — Patient Instructions (Signed)
Breast Tenderness Breast tenderness is a common problem for women of all ages. Breast tenderness may cause mild discomfort to severe pain. The pain usually comes and goes in association with your menstrual cycle, but it can be constant. Breast tenderness has many possible causes, including hormone changes and some medicines. Your health care provider may order tests, such as a mammogram or an ultrasound, to check for any unusual findings. Having breast tenderness usually does not mean that you have breast cancer. Follow these instructions at home: Sometimes, reassurance that you do not have breast cancer is all that is needed. In general, follow these home care instructions: Managing pain and discomfort  If directed, apply ice to the area: ? Put ice in a plastic bag. ? Place a towel between your skin and the bag. ? Leave the ice on for 20 minutes, 2-3 times a day.  Make sure you are wearing a supportive bra, especially during exercise. You may also want to wear a supportive bra while sleeping if your breasts are very tender. Medicines  Take over-the-counter and prescription medicines only as told by your health care provider. If the cause of your pain is infection, you may be prescribed an antibiotic medicine.  If you were prescribed an antibiotic, take it as told by your health care provider. Do not stop taking the antibiotic even if you start to feel better. General instructions  Your health care provider may recommend that you reduce the amount of fat in your diet. You can do this by: ? Limiting fried foods. ? Cooking foods using methods, such as baking, boiling, grilling, and broiling.  Decrease the amount of caffeine in your diet. You can do this by drinking more water and choosing caffeine-free options.  Keep a log of the days and times when your breasts are most tender.  Ask your health care provider how to do breast exams at home. This will help you notice if you have an  unusual growth or lump. Contact a health care provider if:  Any part of your breast is hard, red, and hot to the touch. This may be a sign of infection.  You are not breastfeeding and you have fluid, especially blood or pus, coming out of your nipples.  You have a fever.  You have a new or painful lump in your breast that remains after your menstrual period ends.  Your pain does not improve or it gets worse.  Your pain is interfering with your daily activities. This information is not intended to replace advice given to you by your health care provider. Make sure you discuss any questions you have with your health care provider. Document Released: 03/18/2008 Document Revised: 01/02/2016 Document Reviewed: 01/02/2016 Elsevier Interactive Patient Education  2018 [employer].  Fibrocystic Breast Changes Fibrocystic breast changes are changes that can make your breasts swollen or painful. These changes happen when tiny sacs of fluid (cysts) form in the breast. This is a common condition. It does not mean that you have cancer. It usually happens because of hormone changes during a monthly period. Follow these instructions at home:  Check your breasts after every monthly period. If you do not have monthly periods, check your breasts on the first day of every month. Check for: ? Soreness. ? New swelling or puffiness. ? A change in breast size. ? A change in a lump that was already there.  Take over-the-counter and prescription medicines only as told by your doctor.  Wear a support  or sports bra that fits well. Wear this support especially when you are exercising.  Avoid or have less caffeine, fat, and sugar in what you eat and drink as told by your doctor. Contact a doctor if:  You have fluid coming from your nipple, especially if the fluid has blood in it.  You have new lumps or bumps in your breast.  Your breast gets puffy, red, and painful.  You have changes in how your breast  looks.  Your nipple looks flat or it sinks into your breast. Get help right away if:  Your breast turns red, and the redness is spreading. Summary  Fibrocystic breast changes are changes that can make your breasts swollen or painful.  This condition can happen when you have hormone changes during your monthly period.  With this condition, it is important to check your breasts after every monthly period. If you do not have monthly periods, check your breasts on the first day of every month. This information is not intended to replace advice given to you by your health care provider. Make sure you discuss any questions you have with your health care provider. Document Released: 03/18/2008 Document Revised: 12/18/2015 Document Reviewed: 12/18/2015 Elsevier Interactive Patient Education  2017 [employer].

## 2017-06-02 NOTE — Progress Notes (Addendum)
Subjective:  This chart was scribed for Catherine Knapp, MD by [name], at [place] at [facility].  This patient was seen in room 2 and the patient's care was started at 9:18 AM.   Chief Complaint  Patient presents with  . Breast Pain    pt states she has been having pain in the left breast x 3 weeks. Pt states there is no discharge or redness but states around the nipple is pale and sore.      Patient ID: Catherine Cole, female    DOB: [birth year], 62 y.o.   MRN: [id number]  HPI HPI Comments: Catherine Cole is a 62 y.o. female who presents to Primary Care at [facility] with left breast tenderness/aching (inside her breast) onset three weeks ago.  Patient describes the pain around the nipple area and states that there is some paleness around the areola.  She does get chills occasionally but states that its not like when she usually feels ill.  Last night, patient woke up thinking that she was sleeping on something as she felt like there was something under her but later realized that it was a mass in her left breast   Denies any tenderness in her armpit. Patient has had a biopsy in her left breast previously as she had some density - 2015 (states that this is common with the women in her family, along with cysts). She denies any history of breast cancer in her family. She has been going to bed, sleeping for three hours, and wakes up afterwards.  She is unsure of what is waking her up. She has been drinking less caffeine, denies taking any over the counter medications, she does take vitamin B supplements occasionally.   Last mammogram 12-21 (two months prior)- normal.   Past Medical History:  Diagnosis Date  . Adult ADHD   . Anemia   . Anxiety   . Depression   . Hypertension   . IBS (irritable bowel syndrome)     Current Outpatient Medications on File Prior to Visit  Medication Sig Dispense Refill  . amphetamine-dextroamphetamine (ADDERALL) 10 MG tablet Take 2 tabs  in the morning and 1 tab in the afternoon 90 tablet 0  . [START ON 06/24/2017] amphetamine-dextroamphetamine (ADDERALL) 10 MG tablet Take 2 tabs in the morning and 1 tab with lunch 90 tablet 0  . [START ON 07/22/2017] amphetamine-dextroamphetamine (ADDERALL) 10 MG tablet Take 2 tabs in the morning and 1 tab with lunch 90 tablet 0  . ARIPiprazole (ABILIFY) 5 MG tablet Take 1 tablet (5 mg total) by mouth daily. 30 tablet 11  . B Complex Vitamins (VITAMIN B COMPLEX) TABS Take by mouth.    . escitalopram (LEXAPRO) 10 MG tablet Take 3 tablets (30 mg total) by mouth daily. 270 tablet 3  . hydrochlorothiazide (HYDRODIURIL) 25 MG tablet Take 1 tablet (25 mg total) daily by mouth. 90 tablet 3  . Incontinence Supply Disposable (DEPEND EASY FIT UNDERGARMENTS) MISC Use daily as directed. Change several times daily as needed. 120 each 99  . Melatonin 5 MG TABS Take 1 tablet (5 mg total) by mouth at bedtime. 90 tablet 3  . Multiple Vitamin (MULTIVITAMIN WITH MINERALS) TABS tablet Take 1 tablet by mouth daily.    . valACYclovir (VALTREX) 500 MG tablet TAKE 1 TABLET BY MOUTH EVERY DAY OR AS DIRECTED. 30 tablet 0   No current facility-administered medications on file prior to visit.     Allergies  Allergen Reactions  .  Demerol Nausea And Vomiting  . Codeine Nausea And Vomiting  . Erythromycin Itching and Rash  . Neosporin [Neomycin-Polymyxin B Gu] Itching and Rash   Past Surgical History:  Procedure Laterality Date  . APPENDECTOMY     12 yrs  . CHOLECYSTECTOMY  1995  . CHOLESTEATOMA EXCISION     13 and 22 yrs  . FRACTURE SURGERY    . [place]  . 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 TONSILLECTOMY     15 yrs   Family History  Problem Relation Age of Onset  . Heart disease Mother 48       Heart attack  .  Alcohol abuse Father   . Diabetes Father    Social History   Socioeconomic History  . Marital status: Widowed    Spouse name: None  . Number of children: None  . Years of education: None  . Highest education level: None  Social Needs  . Financial resource strain: None  . Food insecurity - worry: None  . Food insecurity - inability: None  . Transportation needs - medical: None  . Transportation needs - non-medical: None  Occupational History  . None  Tobacco Use  . Smoking status: Former Research scientist (life sciences)  . Smokeless tobacco: Never Used  Substance and Sexual Activity  . Alcohol use: No  . Drug use: No  . Sexual activity: No  Other Topics Concern  . None  Social History Narrative  . None   Depression screen [facility] 2/9 06/02/2017 05/02/2017 02/28/2017 08/26/2016 07/12/2016  Decreased Interest 1 0 0 0 0  Down, Depressed, Hopeless 1 0 0 0 3  PHQ - 2 Score 2 0 0 0 3  Altered sleeping 1 - - - 0  Tired, decreased energy 1 - - - 0  Change in appetite 0 - - - 0  Feeling bad or failure about yourself  1 - - - 0  Trouble concentrating 0 - - - 0  Moving slowly or fidgety/restless 0 - - - 0  Suicidal thoughts 0 - - - 0  PHQ-9 Score 5 - - - 3  Difficult doing work/chores - - - - -    Review of Systems  Constitutional: Negative for fever.  Eyes: Negative for pain, redness and itching.  Respiratory: Negative for cough, choking and shortness of breath.   Gastrointestinal: Negative for nausea and vomiting.  Musculoskeletal:       Left breast tenderness   Skin:       Paling of skin around areola  Neurological: Negative for syncope and speech difficulty.       Objective:   Physical Exam  Constitutional: She is oriented to person, place, and time. She appears well-developed and well-nourished. No distress.  HENT:  Head: Normocephalic and atraumatic.  Pulmonary/Chest: Effort normal. No respiratory distress.  No axillary adenopathy. No distinct palpable abnormalities on breast exam though  question of some poorly defined cyst like masses under areas of tenderness.   Neurological: She is alert and oriented to person, place, and time.  Skin:  approximately 1 cm radius extending from edge of areola is a ring of hypopigmentation on lateral aspect to 5 mm new hemangiomas.    Psychiatric: She has a normal mood and affect. Her behavior is normal.   Vitals:   06/02/17 0822  BP:  138/72  Pulse: 80  Resp: 16  Temp: 98 F (36.7 C)  TempSrc: Oral  SpO2: 95%  Weight: 192 lb (87.1 kg)  Height: 5' 6.14" (1.68 m)      Assessment & Plan:   1. Pain of left breast - check diag mam and [place]  2. Depression with anxiety - pt not feeling abilify is as effective as when we initially began it so increase from 5 to 10mg  (can go up to max of 15mg )    Orders Placed This Encounter  Procedures  . MM DIAG BREAST TOMO UNI LEFT    INS-BCBS PF 04/08/17 @ [facility]/NO NEEDS/TOMO/BC PT COSIGN REQ.    Standing Status:   Future    Standing Expiration Date:   08/01/2018    Order Specific Question:   Reason for Exam (SYMPTOM  OR DIAGNOSIS REQUIRED)    Answer:   3 weeks of acute constant breast pain, fullness, mass sensation, and overlying skin changes    Order Specific Question:   Preferred imaging location?    Answer:   [facility]  . US BREAST LTD UNI LEFT INC AXILLA    INS-BCBS PF 04/08/17 @ [facility]/NO NEEDS/TOMO/BC PT COSIGN REQ.    Standing Status:   Future    Standing Expiration Date:   08/01/2018    Order Specific Question:   Reason for Exam (SYMPTOM  OR DIAGNOSIS REQUIRED)    Answer:   3 weeks of acute constant breast pain, fullness, mass sensation, and overlying skin changes    Order Specific Question:   Preferred imaging location?    Answer:   [facility]  . POCT CBC  . POCT SEDIMENTATION RATE     I personally performed the services described in this documentation, which was scribed in my presence. The recorded information has been reviewed and considered, and addended by me as  needed.   [name], M.D.  Primary Care at [facility] [address] [place], [place] [address] [phone] phone [phone] fax  06/05/17 7:25 AM

## 2017-06-08 ENCOUNTER — Ambulatory Visit
Admission: RE | Admit: 2017-06-08 | Discharge: 2017-06-08 | Disposition: A | Discharge status: Home / Self Care | Payer: BC Managed Care – PPO | Source: Ambulatory Visit | Attending: Family Medicine | Admitting: Family Medicine

## 2017-06-08 DIAGNOSIS — N644 Mastodynia: Secondary | ICD-10-CM

## 2017-06-08 MED ORDER — ARIPIPRAZOLE 10 MG PO TABS: 10.0000 mg | ORAL_TABLET | Freq: Every day | ORAL | 1 refills | Status: DC

## 2017-06-08 NOTE — Addendum Note (Signed)
Addended by: Shawnee Knapp on: 06/08/2017 02:36 AM   Modules accepted: Orders

## 2017-06-22 ENCOUNTER — Telehealth: Payer: Self-pay | Admitting: Family Medicine

## 2017-06-22 NOTE — Telephone Encounter (Signed)
Copied from [place]. Topic: Quick Communication - See Telephone Encounter >> Jun 22, 2017  2:30 PM Cleaster Corin, NT wrote: CRM for notification. See Telephone encounter for:   06/22/17. Pt. Calling to see if ARIPiprazole (ABILIFY) 10 MG tablet [[id number] is the actual med. That Dr. Brigitte Pulse would like for  pt. To stick with. the 10mg  tablet  or is there supposed to be a new medication. Pt. Stated that she thought Dr. Brigitte Pulse was going to send a new rx. Due to side effects of the trimmers on this med. pt. Cb# for pt. [phone]

## 2017-06-24 NOTE — Telephone Encounter (Signed)
Dr. Brigitte Pulse please advise: pt is on abilify 10 mg as she was wanting clarification and if you would like for her to stick with it or is there supposed to be a new medication and she thought shaw was going to sen a new rx due to side effect of the timmers on this med.  Pt cb # [id number]. Dgaddy, CMA

## 2017-06-27 NOTE — Telephone Encounter (Addendum)
Sent MyChart message with below:  I can't tell what else is on her formulary so had decided to increase the dose form 5 to 10mg  to see if that would be more effective for her.  Since she is having tremors on the ability, will be happy to switch but its a crapshoot to know about if covered or copay. If she has a copy of her insurance formulary to bring with her to her next OV that can be helpful.  It also might be best for pt to get a psychiatric c/s at least a time or two to see what alternatives they recommend as we really do not use these types of medication very much. If she saw a psychiatrist to get her med regimen improved, then I would be happy to continue prescribing on his recommendations so that she wouldn't have to continue with the higher copays if she prefer    [place] - in [place], [place] [address] [phone]  [place] [address] [place], [place] [address] [phone]  [facility] [place] [address] [place], [place] [address] [phone]  [place], [place], [place] [address]  Phone: [phone]  [place], [place] Phone: [place]  [address], [place], [place] [address]  [phone]

## 2017-07-08 ENCOUNTER — Encounter: Payer: Self-pay | Admitting: Family Medicine

## 2017-07-08 NOTE — Telephone Encounter (Signed)
Mychart message from provider seen by patient at 12:06 PM on 07/08/2017.

## 2017-07-11 MED ORDER — ESCITALOPRAM OXALATE 20 MG PO TABS: 40.0000 mg | ORAL_TABLET | Freq: Every day | ORAL | 0 refills | Status: DC

## 2017-08-01 ENCOUNTER — Encounter: Payer: BC Managed Care – PPO | Admitting: Family Medicine

## 2017-09-07 ENCOUNTER — Encounter: Payer: Self-pay | Admitting: Family Medicine

## 2017-09-07 ENCOUNTER — Ambulatory Visit: Payer: BC Managed Care – PPO | Admitting: Family Medicine

## 2017-09-07 VITALS — BP 138/83 | HR 86 | Temp 98.7°F | Resp 17 | Ht 66.0 in | Wt 197.0 lb

## 2017-09-07 DIAGNOSIS — F418 Other specified anxiety disorders: Secondary | ICD-10-CM

## 2017-09-07 DIAGNOSIS — F900 Attention-deficit hyperactivity disorder, predominantly inattentive type: Secondary | ICD-10-CM

## 2017-09-07 MED ORDER — AMPHETAMINE-DEXTROAMPHET ER 20 MG PO CP24: 40.0000 mg | ORAL_CAPSULE | ORAL | 0 refills | Status: DC

## 2017-09-07 MED ORDER — AMPHETAMINE-DEXTROAMPHET ER 20 MG PO CP24: 40.0000 mg | ORAL_CAPSULE | Freq: Every day | ORAL | 0 refills | Status: DC

## 2017-09-07 NOTE — Patient Instructions (Signed)
     IF you received an x-ray today, you will receive an invoice from [facility]. Please contact [facility] at [phone] with questions or concerns regarding your invoice.   IF you received labwork today, you will receive an invoice from [facility]. Please contact [facility] at [phone] with questions or concerns regarding your invoice.   Our billing staff will not be able to assist you with questions regarding bills from these companies.  You will be contacted with the lab results as soon as they are available. The fastest way to get your results is to activate your My Chart account. Instructions are located on the last page of this paperwork. If you have not heard from us regarding the results in 2 weeks, please contact this office.     

## 2017-09-07 NOTE — Progress Notes (Signed)
Subjective:  By signing my name below, I, Catherine Cole, attest that this documentation has been prepared under the direction and in the presence of [name], MD. Electronically Signed: [name], [place]. 09/07/2017 , 3:35 PM .  Patient was seen in Room 3 .   Patient ID: Catherine Cole, female    DOB: 10/06/1955, 62 y.o.   MRN: [id number] Chief Complaint  Patient presents with  . review medication   HPI Catherine Cole is a 62 y.o. female who presents to Primary Care at [facility] for follow up. Her counselor "Catherine Cole" had suggested change in her medications, Lexapro and Adderall XR. Patient has become busier recently, usually she takes 2 tablets in the morning, and 1 in the evening. Her counselor suggests upping it to 2 tablets in the morning and 2 in the evening. On the weekends, she usually doesn't take the morning dose unless she's going out to the store. She did call therapist and has met with them once a month.   She takes HCTZ for HTN without any complications. She does check her BP outside of office.   Past Medical History:  Diagnosis Date  . Adult ADHD   . Anemia   . Anxiety   . Depression   . Hypertension   . IBS (irritable bowel syndrome)    Past Surgical History:  Procedure Laterality Date  . APPENDECTOMY     12 yrs  . CHOLECYSTECTOMY  1995  . CHOLESTEATOMA EXCISION     13 and 22 yrs  . FRACTURE SURGERY    . [place]  . 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 TONSILLECTOMY     15 yrs   Prior to Admission medications   Medication Sig Start Date End Date Taking? Authorizing Provider  amphetamine-dextroamphetamine (ADDERALL) 10 MG tablet Take 2 tabs in the morning and 1 tab in the afternoon 05/27/17  Yes Shawnee Knapp, MD  amphetamine-dextroamphetamine  (ADDERALL) 10 MG tablet Take 2 tabs in the morning and 1 tab with lunch 06/24/17  Yes Shawnee Knapp, MD  amphetamine-dextroamphetamine (ADDERALL) 10 MG tablet Take 2 tabs in the morning and 1 tab with lunch 07/22/17  Yes Shawnee Knapp, MD  escitalopram (LEXAPRO) 20 MG tablet Take 2 tablets (40 mg total) by mouth daily. 07/11/17  Yes Shawnee Knapp, MD  hydrochlorothiazide (HYDRODIURIL) 25 MG tablet Take 1 tablet (25 mg total) daily by mouth. 02/28/17  Yes Shawnee Knapp, MD  Incontinence Supply Disposable (DEPEND EASY FIT UNDERGARMENTS) MISC Use daily as directed. Change several times daily as needed. 05/20/16  Yes Shawnee Knapp, MD  Melatonin 5 MG TABS Take 1 tablet (5 mg total) by mouth at bedtime. 05/01/15  Yes Shawnee Knapp, MD  valACYclovir (VALTREX) 500 MG tablet TAKE 1 TABLET BY MOUTH EVERY DAY OR AS DIRECTED. 09/18/15  Yes Shawnee Knapp, MD  B Complex Vitamins (VITAMIN B COMPLEX) TABS Take by mouth.    [provider]  Multiple Vitamin (MULTIVITAMIN WITH MINERALS) TABS tablet Take 1 tablet by mouth daily.    [provider]   Allergies  Allergen Reactions  . Demerol Nausea And Vomiting  . Codeine Nausea And Vomiting  . Erythromycin Itching and Rash  . Neosporin [Neomycin-Polymyxin B Gu] Itching and Rash   Family History  Problem Relation Age of Onset  . Heart disease Mother 39       Heart attack  . Alcohol abuse Father   . Diabetes Father    Social History   Socioeconomic History  . Marital status: Widowed    Spouse name: Not on file  . Number of children: Not on file  . Years of education: Not on file  . Highest education level: Not on file  Occupational History  . Not on file  Social Needs  . Financial resource strain: Not on file  . Food insecurity:    Worry: Not on file    Inability: Not on file  . Transportation needs:    Medical: Not on file    Non-medical: Not on file  Tobacco Use  . Smoking status: Former Research scientist (life sciences)  . Smokeless tobacco: Never Used  Substance and  Sexual Activity  . Alcohol use: No  . Drug use: No  . Sexual activity: Never  Lifestyle  . Physical activity:    Days per week: Not on file    Minutes per session: Not on file  . Stress: Not on file  Relationships  . Social connections:    Talks on phone: Not on file    Gets together: Not on file    Attends religious service: Not on file    Active member of club or organization: Not on file    Attends meetings of clubs or organizations: Not on file    Relationship status: Not on file  Other Topics Concern  . Not on file  Social History Narrative  . Not on file   Depression screen [facility] 2/9 09/07/2017 06/02/2017 05/02/2017 02/28/2017 08/26/2016  Decreased Interest 0 1 0 0 0  Down, Depressed, Hopeless 0 1 0 0 0  PHQ - 2 Score 0 2 0 0 0  Altered sleeping - 1 - - -  Tired, decreased energy - 1 - - -  Change in appetite - 0 - - -  Feeling bad or failure about yourself  - 1 - - -  Trouble concentrating - 0 - - -  Moving slowly or fidgety/restless - 0 - - -  Suicidal thoughts - 0 - - -  PHQ-9 Score - 5 - - -  Difficult doing work/chores - - - - -    Review of Systems  Constitutional: Negative for chills, fatigue, fever and unexpected weight change.  Respiratory: Negative for cough.   Gastrointestinal: Negative for constipation, diarrhea, nausea and vomiting.  Skin: Negative for rash and wound.  Neurological: Negative for dizziness, weakness and headaches.       Objective:   Physical Exam  Constitutional: She is oriented to person, place, and time. She appears well-developed and well-nourished. No distress.  HENT:  Head: Normocephalic and atraumatic.  Eyes: Pupils are equal, round, and reactive to light. EOM are normal.  Neck: Neck supple.  Cardiovascular: Normal rate.  Pulmonary/Chest: Effort normal. No respiratory distress.  Musculoskeletal: Normal range of motion.  Neurological: She is alert and oriented to person, place, and time.  Skin: Skin is warm and dry.    Psychiatric: She has a normal mood and affect. Her behavior is normal.  Nursing note and vitals reviewed.   BP 138/83   Pulse 86   Temp 98.7 F (37.1 C) (Oral)   Resp 17   Ht '5\' 6"'  (1.676 m)   Wt 197 lb (89.4 kg)   SpO2 98%   BMI 31.80 kg/m  Assessment & Plan:  Enter future labs for CPE on 6/26.  1. Attention deficit hyperactivity disorder (ADHD), predominantly inattentive type   2. Depression with anxiety     Meds ordered this encounter  Medications  . amphetamine-dextroamphetamine (ADDERALL XR) 20 MG 24 hr capsule    Sig: Take 2 capsules (40 mg total) by mouth daily.    Dispense:  60 capsule    Refill:  0  . amphetamine-dextroamphetamine (ADDERALL XR) 20 MG 24 hr capsule    Sig: Take 2 capsules (40 mg total) by mouth every morning.    Dispense:  60 capsule    Refill:  0    [name], MD, MPH Primary Care at [place], [place]  [address] [phone] Office phone  [phone] Office fax   01/24/18 2:12 PM

## 2017-10-01 ENCOUNTER — Other Ambulatory Visit: Payer: Self-pay | Admitting: Family Medicine

## 2017-10-12 ENCOUNTER — Other Ambulatory Visit: Payer: Self-pay

## 2017-10-12 ENCOUNTER — Telehealth: Payer: Self-pay | Admitting: Family Medicine

## 2017-10-12 ENCOUNTER — Encounter: Payer: Self-pay | Admitting: Family Medicine

## 2017-10-12 ENCOUNTER — Ambulatory Visit (INDEPENDENT_AMBULATORY_CARE_PROVIDER_SITE_OTHER): Payer: BC Managed Care – PPO | Admitting: Family Medicine

## 2017-10-12 VITALS — BP 138/88 | HR 92 | Temp 98.8°F | Resp 16 | Ht 66.0 in | Wt 189.2 lb

## 2017-10-12 DIAGNOSIS — Z113 Encounter for screening for infections with a predominantly sexual mode of transmission: Secondary | ICD-10-CM

## 2017-10-12 DIAGNOSIS — Z1389 Encounter for screening for other disorder: Secondary | ICD-10-CM

## 2017-10-12 DIAGNOSIS — Z1212 Encounter for screening for malignant neoplasm of rectum: Secondary | ICD-10-CM

## 2017-10-12 DIAGNOSIS — Z1329 Encounter for screening for other suspected endocrine disorder: Secondary | ICD-10-CM | POA: Diagnosis not present

## 2017-10-12 DIAGNOSIS — Z1231 Encounter for screening mammogram for malignant neoplasm of breast: Secondary | ICD-10-CM | POA: Diagnosis not present

## 2017-10-12 DIAGNOSIS — Z79899 Other long term (current) drug therapy: Secondary | ICD-10-CM

## 2017-10-12 DIAGNOSIS — E559 Vitamin D deficiency, unspecified: Secondary | ICD-10-CM

## 2017-10-12 DIAGNOSIS — Z136 Encounter for screening for cardiovascular disorders: Secondary | ICD-10-CM

## 2017-10-12 DIAGNOSIS — Z1383 Encounter for screening for respiratory disorder NEC: Secondary | ICD-10-CM

## 2017-10-12 DIAGNOSIS — Z0000 Encounter for general adult medical examination without abnormal findings: Secondary | ICD-10-CM

## 2017-10-12 DIAGNOSIS — Z114 Encounter for screening for human immunodeficiency virus [HIV]: Secondary | ICD-10-CM

## 2017-10-12 DIAGNOSIS — Z130 Encounter for screening for diseases of the blood and blood-forming organs and certain disorders involving the immune mechanism: Secondary | ICD-10-CM

## 2017-10-12 DIAGNOSIS — Z1211 Encounter for screening for malignant neoplasm of colon: Secondary | ICD-10-CM

## 2017-10-12 LAB — POCT URINALYSIS DIP (MANUAL ENTRY)
BILIRUBIN UA: NEGATIVE
GLUCOSE UA: NEGATIVE mg/dL
Ketones, POC UA: NEGATIVE mg/dL
Leukocytes, UA: NEGATIVE
NITRITE UA: NEGATIVE
Protein Ur, POC: NEGATIVE mg/dL
Spec Grav, UA: 1.02 (ref 1.010–1.025)
Urobilinogen, UA: 0.2 E.U./dL
pH, UA: 7 (ref 5.0–8.0)

## 2017-10-12 NOTE — Progress Notes (Signed)
Subjective:  By signing my name below, I, Catherine Cole, attest that this documentation has been prepared under the direction and in the presence of [name], MD Electronically Signed: Ladene Artist, ED Scribe 10/12/2017 at 10:21 AM.   Patient ID: Catherine Cole, female    DOB: 1955/07/09, 62 y.o.   MRN: [id number]  Chief Complaint  Patient presents with  . Annual Exam   HPI Catherine Cole is a 62 y.o. female who presents to Primary Care at [facility] for an annual exam. Pt is fasting at this visit.  Primary Preventative Screenings: Cervical Cancer: 2014 with Dr. Raphael Gibney at [facility]. Did have endometrial biopsy in 2001 for uterine bleeding and fibroids, benign. Hysterectomy the following wk with cervix removed, showed benign tissue and cysts. Multiple fibroids and adenomyosis. Maternal aunt with h/o ovarian CA. Family Planning: sp hysterectomy STI screening: HIV screening done today. Last encounter ~8 yrs ago. Breast Cancer: 04/07/17, normal at the [facility]: Reported to be done in 2012 by Dr. [name] but no copy seen in chart. Pt reports that she saw Dr. [name] in 11/2015 but had rpt colonoscopy in 2018 and was recommended to rpt in 5 yrs. Skin CA screening: Family h/o skin CA. She would like a referral to dermatology. Tobacco use/EtOH/substances: Bone Density: Mother has a h/o osteoporosis. She does eat cheese and drink milk but does not take a calcium supplement Cardiac: saw Dr. Ellyn Hack in 2017, normal w/u. EKG updated today Weight/Blood sugar/Diet/Exercise: BMI Readings from Last 3 Encounters:  10/12/17 30.54 kg/m  09/07/17 31.80 kg/m  06/02/17 30.86 kg/m   Lab Results  Component Value Date   HGBA1C 5.5 03/26/2014   OTC/Vit/Supp/Herbal: Vit D was 36 3 yrs ago, up from 57. States she picked up a Vit D supplement but has not started it yet. She is not currently taking any OTC supplements. Dentist/Optho: Immunizations:  Immunization  History  Administered Date(s) Administered  . Influenza,inj,Quad PF,6+ Mos 12/15/2016  . Influenza-Unspecified 02/14/2014, 02/11/2016  . Tdap 03/26/2014   Chronic Medical Conditions: ADHD - At last visit, changed from adderall IR 20 mg to XR 40 mg daily 1 month prior, 2 scripts given. - Pt states that her pharmacy told her that her insurance company would only approve 45 days. She tried Ritalin when she was initially diagnosed, Concerta ~99 yrs ago. Anxiety/Depression - Pt has tried Wellbutrin, Prozac, Lexapro in the past.  Past Medical History:  Diagnosis Date  . Adult ADHD   . Anemia   . Anxiety   . Depression   . Hypertension   . IBS (irritable bowel syndrome)    Past Surgical History:  Procedure Laterality Date  . APPENDECTOMY     12 yrs  . CHOLECYSTECTOMY  1995  . CHOLESTEATOMA EXCISION     13 and 22 yrs  . FRACTURE SURGERY    . [place]  . 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 TONSILLECTOMY     15 yrs   Current Outpatient Medications on File Prior to Visit  Medication Sig Dispense Refill  . amphetamine-dextroamphetamine (ADDERALL XR) 20 MG 24 hr capsule Take 2 capsules (40 mg total) by mouth daily. 60 capsule 0  . amphetamine-dextroamphetamine (ADDERALL XR) 20 MG 24 hr capsule Take 2 capsules (40 mg total) by mouth every morning. 60 capsule  0  . B Complex Vitamins (VITAMIN B COMPLEX) TABS Take by mouth.    . escitalopram (LEXAPRO) 20 MG tablet TAKE 2 TABLETS BY MOUTH EVERY DAY 180 tablet 0  . hydrochlorothiazide (HYDRODIURIL) 25 MG tablet Take 1 tablet (25 mg total) daily by mouth. 90 tablet 3  . Incontinence Supply Disposable (DEPEND EASY FIT UNDERGARMENTS) MISC Use daily as directed. Change several times daily as needed. 120 each 99  . Melatonin 5 MG TABS Take 1 tablet (5 mg  total) by mouth at bedtime. 90 tablet 3  . Multiple Vitamin (MULTIVITAMIN WITH MINERALS) TABS tablet Take 1 tablet by mouth daily.    . valACYclovir (VALTREX) 500 MG tablet TAKE 1 TABLET BY MOUTH EVERY DAY OR AS DIRECTED. 30 tablet 0   No current facility-administered medications on file prior to visit.    Allergies  Allergen Reactions  . Demerol Nausea And Vomiting  . Codeine Nausea And Vomiting  . Erythromycin Itching and Rash  . Neosporin [Neomycin-Polymyxin B Gu] Itching and Rash   Family History  Problem Relation Age of Onset  . Heart disease Mother 5       Heart attack  . Alcohol abuse Father   . Diabetes Father    Social History   Socioeconomic History  . Marital status: Widowed    Spouse name: Not on file  . Number of children: Not on file  . Years of education: Not on file  . Highest education level: Not on file  Occupational History  . Not on file  Social Needs  . Financial resource strain: Not on file  . Food insecurity:    Worry: Not on file    Inability: Not on file  . Transportation needs:    Medical: Not on file    Non-medical: Not on file  Tobacco Use  . Smoking status: Former Research scientist (life sciences)  . Smokeless tobacco: Never Used  Substance and Sexual Activity  . Alcohol use: No  . Drug use: No  . Sexual activity: Never  Lifestyle  . Physical activity:    Days per week: Not on file    Minutes per session: Not on file  . Stress: Not on file  Relationships  . Social connections:    Talks on phone: Not on file    Gets together: Not on file    Attends religious service: Not on file    Active member of club or organization: Not on file    Attends meetings of clubs or organizations: Not on file    Relationship status: Not on file  Other Topics Concern  . Not on file  Social History Narrative  . Not on file   Depression screen [facility] 2/9 10/12/2017 09/07/2017 06/02/2017 05/02/2017 02/28/2017  Decreased Interest 0 0 1 0 0  Down, Depressed, Hopeless 0 0 1 0 0  PHQ  - 2 Score 0 0 2 0 0  Altered sleeping - - 1 - -  Tired, decreased energy - - 1 - -  Change in appetite - - 0 - -  Feeling bad or failure about yourself  - - 1 - -  Trouble concentrating - - 0 - -  Moving slowly or fidgety/restless - - 0 - -  Suicidal thoughts - - 0 - -  PHQ-9 Score - - 5 - -  Difficult doing work/chores - - - - -   Review of Systems    Objective:   Physical Exam  Constitutional: She is oriented to person, place,  and time. She appears well-developed and well-nourished. No distress.  HENT:  Head: Normocephalic and atraumatic.  Right Ear: A middle ear effusion is present.  Left Ear: A middle ear effusion is present.  Nose: Nose normal.  Mouth/Throat: Oropharynx is clear and moist and mucous membranes are normal.  Eyes: Conjunctivae and EOM are normal.  Neck: Neck supple. No tracheal deviation present. No thyroid mass and no thyromegaly present.  Cardiovascular: Regular rhythm and normal heart sounds. Tachycardia present.  No murmur heard. Pulses:      Dorsalis pedis pulses are 2+ on the right side, and 2+ on the left side.  Pulmonary/Chest: Effort normal and breath sounds normal. No respiratory distress. Right breast exhibits no mass, no nipple discharge and no tenderness. Left breast exhibits no mass, no nipple discharge and no tenderness. No breast tenderness.  Normal breast exam.  Abdominal: Bowel sounds are normal. She exhibits no mass. There is no hepatosplenomegaly.  Musculoskeletal: Normal range of motion. She exhibits no edema.  Lymphadenopathy:    She has no cervical adenopathy.    She has no axillary adenopathy.  Neurological: She is alert and oriented to person, place, and time.  Skin: Skin is warm and dry.  Psychiatric: She has a normal mood and affect. Her behavior is normal.  Nursing note and vitals reviewed.  BP 138/88   Pulse 92   Temp 98.8 F (37.1 C)   Resp 16   Ht 5\' 6"  (1.676 m)   Wt 189 lb 3.2 oz (85.8 kg)   SpO2 94%   BMI 30.54 kg/m      EKG: NSR, no acute ischemic changes noted. No significant change noted when compared to prior EKG done 03/26/2014   I have personally reviewed the EKG tracing and agree with the computer interpretation. Sinus  Rhythm  -RSR(V1) -nondiagnostic.   Low voltage with rightward P-axis and rotation -possible pulmonary disease.   ABNORMAL   Assessment & Plan:   1. Annual physical exam   2. Screening for cardiovascular, respiratory, and genitourinary diseases   3. Encounter for screening mammogram for breast cancer   4. Routine screening for STI (sexually transmitted infection)   5. Screening for colorectal cancer -patient reports that she had colonoscopy done in 2018 by Dr. Leana Roe was recommended to have this repeated in another 5 years.  We need to request a copy of this for patient's chart.  6. Screening for deficiency anemia   7. Screening for thyroid disorder   8. Screening for HIV (human immunodeficiency virus)   9. Encounter for long-term current use of high risk medication   10. On stimulant medication   11. Vitamin D deficiency -start the OTC supplement she has already bought  no pap indicated due to complete hysterectomy with cervix removed  Orders Placed This Encounter  Procedures  . Lipid panel    Order Specific Question:   Has the patient fasted?    Answer:   Yes  . Comprehensive metabolic panel    Order Specific Question:   Has the patient fasted?    Answer:   Yes  . HIV antibody  . TSH  . VITAMIN D 25 Hydroxy (Vit-D Deficiency, Fractures)  . TSH  . VITAMIN D 25 Hydroxy (Vit-D Deficiency, Fractures)  . POCT urinalysis dipstick  . EKG 12-Lead      [name], MD, MPH Primary Care at [facility] [address] [place], [place]  [address] [phone] Office phone  [phone] Office  fax   01/24/18 2:25 PM

## 2017-10-12 NOTE — Patient Instructions (Addendum)
IF you received an x-ray today, you will receive an invoice from [facility]. Please contact [facility] at [phone] with questions or concerns regarding your invoice.   IF you received labwork today, you will receive an invoice from [facility]. Please contact [facility] at [phone] with questions or concerns regarding your invoice.   Our billing staff will not be able to assist you with questions regarding bills from these companies.  You will be contacted with the lab results as soon as they are available. The fastest way to get your results is to activate your My Chart account. Instructions are located on the last page of this paperwork. If you have not heard from [place] regarding the results in 2 weeks, please contact this office.    Keeping You Healthy  Get These Tests  Blood Pressure- Have your blood pressure checked by your healthcare provider at least once a year.  Normal blood pressure is 120/80.  Weight- Have your body mass index (BMI) calculated to screen for obesity.  BMI is a measure of body fat based on height and weight.  You can calculate your own BMI at GravelBags.it  Cholesterol- Have your cholesterol checked every year.  Diabetes- Have your blood sugar checked every year if you have high blood pressure, high cholesterol, a family history of diabetes or if you are overweight.  Pap Test - Have a pap test every 1 to 5 years if you have been sexually active.  If you are older than 65 and recent pap tests have been normal you may not need additional pap tests.  In addition, if you have had a hysterectomy  for benign disease additional pap tests are not necessary.  Mammogram-Yearly mammograms are essential for early detection of breast cancer  Screening for Colon Cancer- Colonoscopy starting at age 50. Screening may begin sooner depending on your family history and other health conditions.  Follow up colonoscopy as directed by your  Gastroenterologist.  Screening for Osteoporosis- Screening begins at age 27 with bone density scanning, sooner if you are at higher risk for developing Osteoporosis.  Get these medicines  Calcium with Vitamin D- Your body requires 1200-1500 mg of Calcium a day and [phone] IU of Vitamin D a day.  You can only absorb 500 mg of Calcium at a time therefore Calcium must be taken in 2 or 3 separate doses throughout the day.  Hormones- Hormone therapy has been associated with increased risk for certain cancers and heart disease.  Talk to your healthcare provider about if you need relief from menopausal symptoms.  Aspirin- Ask your healthcare provider about taking Aspirin to prevent Heart Disease and Stroke.  Get these Immuniztions  Flu shot- Every fall  Pneumonia shot- Once after the age of 31; if you are younger ask your healthcare provider if you need a pneumonia shot.  Tetanus- Every ten years.  Zostavax- Once after the age of 54 to prevent shingles.  Take these steps  Don't smoke- Your healthcare provider can help you quit. For tips on how to quit, ask your healthcare provider or go to www.smokefree.gov or call 1-800 QUIT-NOW.  Be physically active- Exercise 5 days a week for a minimum of 30 minutes.  If you are not already physically active, start slow and gradually work up to 30 minutes of moderate physical activity.  Try walking, dancing, bike riding, swimming, etc.  Eat a healthy diet- Eat a variety of healthy foods such as fruits, vegetables, whole grains, low fat  milk, low fat cheeses, yogurt, lean meats, chicken, fish, eggs, dried beans, tofu, etc.  For more information go to www.thenutritionsource.org  Dental visit- Brush and floss teeth twice daily; visit your dentist twice a year.  Eye exam- Visit your Optometrist or Ophthalmologist yearly.  Drink alcohol in moderation- Limit alcohol intake to one drink or less a day.  Never drink and drive.  Depression- Your emotional  health is as important as your physical health.  If you're feeling down or losing interest in things you normally enjoy, please talk to your healthcare provider.  Seat Belts- can save your life; always wear one  Smoke/Carbon Monoxide detectors- These detectors need to be installed on the appropriate level of your home.  Replace batteries at least once a year.  Violence- If anyone is threatening or hurting you, please tell your healthcare provider.  Living Will/ Health care power of attorney- Discuss with your healthcare provider and family.  Bone Health Bones protect organs, store calcium, and anchor muscles. Good health habits, such as eating nutritious foods and exercising regularly, are important for maintaining healthy bones. They can also help to prevent a condition that causes bones to lose density and become weak and brittle (osteoporosis). Why is bone mass important? Bone mass refers to the amount of bone tissue that you have. The higher your bone mass, the stronger your bones. An important step toward having healthy bones throughout life is to have strong and dense bones during childhood. A young adult who has a high bone mass is more likely to have a high bone mass later in life. Bone mass at its greatest it is called peak bone mass. A large decline in bone mass occurs in older adults. In women, it occurs about the time of menopause. During this time, it is important to practice good health habits, because if more bone is lost than what is replaced, the bones will become less healthy and more likely to break (fracture). If you find that you have a low bone mass, you may be able to prevent osteoporosis or further bone loss by changing your diet and lifestyle. How can I find out if my bone mass is low? Bone mass can be measured with an X-ray test that is called a bone mineral density (BMD) test. This test is recommended for all women who are age 42 or older. It may also be recommended for men  who are age 46 or older, or for people who are more likely to develop osteoporosis due to:  Having bones that break easily.  Having a long-term disease that weakens bones, such as kidney disease or rheumatoid arthritis.  Having menopause earlier than normal.  Taking medicine that weakens bones, such as steroids, thyroid hormones, or hormone treatment for breast cancer or prostate cancer.  Smoking.  Drinking three or more alcoholic drinks each day.  What are the nutritional recommendations for healthy bones? To have healthy bones, you need to get enough of the right minerals and vitamins. Most nutrition experts recommend getting these nutrients from the foods that you eat. Nutritional recommendations vary from person to person. Ask your health care provider what is healthy for you. Here are some general guidelines. Calcium Recommendations Calcium is the most important (essential) mineral for bone health. Most people can get enough calcium from their diet, but supplements may be recommended for people who are at risk for osteoporosis. Good sources of calcium include:  Dairy products, such as low-fat or nonfat milk, cheese, and yogurt.  Dark green leafy vegetables, such as bok choy and broccoli.  Calcium-fortified foods, such as orange juice, cereal, bread, soy beverages, and tofu products.  Nuts, such as almonds.  Follow these recommended amounts for daily calcium intake:  Children, age 10?3: 700 mg.  Children, age 18?8: 1,000 mg.  Children, age 61?13: 1,300 mg.  Teens, age 63?18: 1,300 mg.  Adults, age 66?50: 1,000 mg.  Adults, age 60?70: ? Men: 1,000 mg. ? Women: 1,200 mg.  Adults, age 22 or older: 1,200 mg.  Pregnant and breastfeeding females: ? Teens: 1,300 mg. ? Adults: 1,000 mg.  Vitamin D Recommendations Vitamin D is the most essential vitamin for bone health. It helps the body to absorb calcium. Sunlight stimulates the skin to make vitamin D, so be sure to get  enough sunlight. If you live in a cold climate or you do not get outside often, your health care provider may recommend that you take vitamin D supplements. Good sources of vitamin D in your diet include:  Egg yolks.  Saltwater fish.  Milk and cereal fortified with vitamin D.  Follow these recommended amounts for daily vitamin D intake:  Children and teens, age 35?18: 18 international units.  Adults, age 64 or younger: 400-800 international units.  Adults, age 184 or older: 800-1,000 international units.  Other Nutrients Other nutrients for bone health include:  Phosphorus. This mineral is found in meat, poultry, dairy foods, nuts, and legumes. The recommended daily intake for adult men and adult women is 700 mg.  Magnesium. This mineral is found in seeds, nuts, dark green vegetables, and legumes. The recommended daily intake for adult men is 400?420 mg. For adult women, it is 310?320 mg.  Vitamin K. This vitamin is found in green leafy vegetables. The recommended daily intake is 120 mg for adult men and 90 mg for adult women.  What type of physical activity is best for building and maintaining healthy bones? Weight-bearing and strength-building activities are important for building and maintaining peak bone mass. Weight-bearing activities cause muscles and bones to work against gravity. Strength-building activities increases muscle strength that supports bones. Weight-bearing and muscle-building activities include:  Walking and hiking.  Jogging and running.  Dancing.  Gym exercises.  Lifting weights.  Tennis and racquetball.  Climbing stairs.  Aerobics.  Adults should get at least 30 minutes of moderate physical activity on most days. Children should get at least 60 minutes of moderate physical activity on most days. Ask your health care provide what type of exercise is best for you. Where can I find more information? For more information, check out the following  websites:  [place]: YardHomes.se  [employer] of Health: http://www.niams.AnonymousEar.fr.asp  This information is not intended to replace advice given to you by your health care provider. Make sure you discuss any questions you have with your health care provider. Document Released: 06/26/2003 Document Revised: 10/24/2015 Document Reviewed: 04/10/2014 Elsevier Interactive Patient Education  2018 [employer].    Calcium Intake Recommendations Calcium is a mineral that affects many functions in the body, including:  Blood clotting.  Blood vessel function.  Nerve impulse conduction.  Hormone secretion.  Muscle contraction.  Bone and teeth functions.  Most of your body's calcium supply is stored in your bones and teeth. When your calcium stores are low, you may be at risk for low bone mass, bone loss, and bone fractures. Consuming enough calcium helps to grow healthy bones and teeth and to prevent breakdown over time. It is  very important that you get enough calcium if you are:  A child undergoing rapid growth.  An adolescent girl.  A pre- or post-menopausal woman.  A woman whose menstrual cycle has stopped due to anorexia nervosa or regular intense exercise.  An individual with lactose intolerance or a milk allergy.  A vegetarian.  What is my plan? Try to consume the recommended amount of calcium daily based on your age. Depending on your overall health, your health care provider may recommend increased calcium intake.General daily calcium intake recommendations by age are:  Birth to 6 months: 200 mg.  Infants 7 to 12 months: 260 mg.  Children 1 to 3 years: 700 mg.  Children 4 to 8 years: 1,000 mg.  Children 9 to 13 years: 1,300 mg.  Teens 14 to 18 years: 1,300 mg.  Adults 19 to 50 years: 1,000 mg.  Adult women 51 to 70 years: 1,200 mg.  Adult men 51 to 70 years:  1,000 mg.  Adults 71 years and older: 1,200 mg.  Pregnant and breastfeeding teens: 1,300 mg.  Pregnant and breastfeeding adults: 1,000 mg.  What do I need to know about calcium intake?  In order for the body to absorb calcium, it needs vitamin D. You can get vitamin D through: ? Direct exposure of the skin to sunlight. ? Foods, such as egg yolks, liver, saltwater fish, and fortified milk. ? Supplements.  Consuming too much calcium may cause: ? Constipation. ? Decreased absorption of iron and zinc. ? Kidney stones.  Calcium supplements may interact with certain medicines. Check with your health care provider before starting any calcium supplements.  Try to get most of your calcium from food. What foods can I eat? Grains  Fortified oatmeal. Fortified ready-to-eat cereals. Fortified frozen waffles. Vegetables Turnip greens. Broccoli. Fruits Fortified orange juice. Meats and Other Protein Sources Canned sardines with bones. Canned salmon with bones. Soy beans. Tofu. Baked beans. Almonds. [place] nuts. Sunflower seeds. Dairy Milk. Yogurt. Cheese. Cottage cheese. Beverages Fortified soy milk. Fortified rice milk. Sweets/Desserts Pudding. Ice Cream. Milkshakes. Blackstrap molasses. The items listed above may not be a complete list of recommended foods or beverages. Contact your dietitian for more options. What foods can affect my calcium intake? It may be more difficult for your body to use calcium or calcium may leave your body more quickly if you consume large amounts of:  Sodium.  Protein.  Caffeine.  Alcohol.  This information is not intended to replace advice given to you by your health care provider. Make sure you discuss any questions you have with your health care provider. Document Released: 11/18/2003 Document Revised: 10/24/2015 Document Reviewed: 09/11/2013 Elsevier Interactive Patient Education  2018 [employer].

## 2017-10-12 NOTE — Telephone Encounter (Signed)
Pt states that pharmacy told her that her insurance would only authorize the first 45d of treatment of her adderall XR 40mg  a day - she had the first 30d filled but then did not get the half 15d fill on the second rx I gave her as pharmacist told her it would have still been a full copay.  Can we please contact either pharmacy and/or insurance to see if this needs a PA?  Pt has failed adderall IR, concerta, ritalin, wellbutrin, prozac, lexapro, ability. . .  Thanks.

## 2017-10-13 LAB — COMPREHENSIVE METABOLIC PANEL
A/G RATIO: 2.2 (ref 1.2–2.2)
ALBUMIN: 4.6 g/dL (ref 3.6–4.8)
ALT: 18 IU/L (ref 0–32)
AST: 13 IU/L (ref 0–40)
Alkaline Phosphatase: 75 IU/L (ref 39–117)
BILIRUBIN TOTAL: 0.4 mg/dL (ref 0.0–1.2)
BUN / CREAT RATIO: 19 (ref 12–28)
BUN: 16 mg/dL (ref 8–27)
CHLORIDE: 100 mmol/L (ref 96–106)
CO2: 25 mmol/L (ref 20–29)
Calcium: 9.5 mg/dL (ref 8.7–10.3)
Creatinine, Ser: 0.86 mg/dL (ref 0.57–1.00)
GFR calc non Af Amer: 73 mL/min/{1.73_m2} (ref >59)
GFR, EST AFRICAN AMERICAN: 84 mL/min/{1.73_m2} (ref >59)
GLUCOSE: 106 mg/dL — AB (ref 65–99)
Globulin, Total: 2.1 g/dL (ref 1.5–4.5)
Potassium: 3.8 mmol/L (ref 3.5–5.2)
SODIUM: 142 mmol/L (ref 134–144)
Total Protein: 6.7 g/dL (ref 6.0–8.5)

## 2017-10-13 LAB — TSH: TSH: 2.08 u[IU]/mL (ref 0.450–4.500)

## 2017-10-13 LAB — HIV ANTIBODY (ROUTINE TESTING W REFLEX): HIV Screen 4th Generation wRfx: NONREACTIVE

## 2017-10-13 LAB — VITAMIN D 25 HYDROXY (VIT D DEFICIENCY, FRACTURES): VIT D 25 HYDROXY: 14.5 ng/mL — AB (ref 30.0–100.0)

## 2017-10-13 LAB — LIPID PANEL
Chol/HDL Ratio: 4.6 ratio — ABNORMAL HIGH (ref 0.0–4.4)
Cholesterol, Total: 205 mg/dL — ABNORMAL HIGH (ref 100–199)
HDL: 45 mg/dL (ref >39)
LDL Calculated: 136 mg/dL — ABNORMAL HIGH (ref 0–99)
Triglycerides: 122 mg/dL (ref 0–149)
VLDL Cholesterol Cal: 24 mg/dL (ref 5–40)

## 2017-10-14 NOTE — Telephone Encounter (Signed)
Pa is done approved for 36 months.   Called pharmacy.

## 2017-10-19 ENCOUNTER — Encounter: Payer: Self-pay | Admitting: Family Medicine

## 2017-11-15 ENCOUNTER — Telehealth: Payer: Self-pay | Admitting: Family Medicine

## 2017-11-15 NOTE — Telephone Encounter (Signed)
Copied from [place] [id number]. Topic: Quick Communication - Rx Refill/Question >> Nov 15, 2017  9:52 AM Burchel, Abbi R wrote: Medication: amphetamine-dextroamphetamine (ADDERALL XR) 20 MG 24 hr capsule   [id number] [place], [place]. AT [address]. [place] [address] Phone: [phone] Fax: [phone]   Pt was advised that RX refills may take up to 3 business days. Pt has 2 doses left.

## 2017-11-18 ENCOUNTER — Other Ambulatory Visit: Payer: Self-pay | Admitting: Family Medicine

## 2017-11-18 NOTE — Telephone Encounter (Signed)
Copied from [place] [id number]. Topic: Quick Communication - Rx Refill/Question >> Nov 18, 2017  3:30 PM Carolyn Stare wrote:   Pt call back to follow up on her med refill

## 2017-11-19 NOTE — Telephone Encounter (Signed)
Please advise 

## 2017-11-21 ENCOUNTER — Other Ambulatory Visit: Payer: Self-pay

## 2017-11-21 NOTE — Telephone Encounter (Signed)
Pt requesting refill of Adderall.  Rx pended.  Message provided directly to Dr. Brigitte Pulse.

## 2017-11-22 MED ORDER — AMPHETAMINE-DEXTROAMPHET ER 20 MG PO CP24: 40.0000 mg | ORAL_CAPSULE | Freq: Every day | ORAL | 0 refills | Status: DC

## 2017-11-22 NOTE — Telephone Encounter (Signed)
Meds ordered this encounter  Medications  . amphetamine-dextroamphetamine (ADDERALL XR) 20 MG 24 hr capsule    Sig: Take 2 capsules (40 mg total) by mouth daily.    Dispense:  60 capsule    Refill:  0   [facility] [address]

## 2017-11-22 NOTE — Addendum Note (Signed)
Addended by: Shawnee Knapp on: 11/22/2017 02:28 PM   Modules accepted: Orders

## 2017-12-15 ENCOUNTER — Telehealth: Payer: Self-pay | Admitting: Family Medicine

## 2017-12-15 ENCOUNTER — Ambulatory Visit: Payer: BC Managed Care – PPO | Admitting: Family Medicine

## 2017-12-15 MED ORDER — AMPHETAMINE-DEXTROAMPHET ER 20 MG PO CP24: 40.0000 mg | ORAL_CAPSULE | ORAL | 0 refills | Status: DC

## 2017-12-15 NOTE — Telephone Encounter (Signed)
Rerouting to Dr. Brigitte Pulse per [facility] request

## 2017-12-15 NOTE — Telephone Encounter (Signed)
Meds ordered this encounter  Medications  . amphetamine-dextroamphetamine (ADDERALL XR) 20 MG 24 hr capsule    Sig: Take 2 capsules (40 mg total) by mouth every morning.    Dispense:  60 capsule    Refill:  0   Sent in adderall - pt was asking for 1 mo supply rather than just a week or 10d as it was the same copay - pt well known to me stable on med for years and highly compliant about visits so ok to refill x 1 mo.

## 2017-12-15 NOTE — Telephone Encounter (Signed)
FYI   Pt. Came into office 20 minutes late for their appt. And 5 minutes over cutoff. Pt. Is requesting a refill of amphetamine-dextroamphetamine for one week to last her until her appt. Doctor Brigitte Pulse walked up during the encounter and decided to refill one months worth. I do not know if this was done immediately after the conversation with the pt.

## 2017-12-17 ENCOUNTER — Telehealth: Payer: Self-pay | Admitting: Family Medicine

## 2017-12-17 NOTE — Telephone Encounter (Signed)
Called pt. Leaving message detailing scheduling error made on 12/15/17. Pt. Had come in over 15 minutes late to appt. And need to reschedule. With Dr. Manya Silvas permission a sameday visit was blocked for the pt so she could come in that day. On 12/17/17 was informed that sameday slots were no longer allowed to be changed. Left message with pt. Explaining situation and the pt would need to call and be scheduled for a visit.

## 2017-12-29 ENCOUNTER — Telehealth: Payer: Self-pay | Admitting: Family Medicine

## 2017-12-30 ENCOUNTER — Other Ambulatory Visit: Payer: Self-pay | Admitting: Family Medicine

## 2017-12-30 NOTE — Telephone Encounter (Signed)
Lexapro 20mg  refill Last Refill:09/02/17 # 180 Last OV: 02/18/17 PCP: [name] [phone]

## 2018-01-17 ENCOUNTER — Telehealth: Payer: Self-pay | Admitting: Family Medicine

## 2018-01-17 NOTE — Telephone Encounter (Signed)
Copied from [place] [id number]. Topic: Quick Communication - Rx Refill/Question >> Jan 17, 2018  3:17 PM Waldemar Dickens, Sade R wrote: Medication: amphetamine-dextroamphetamine (ADDERALL XR) 20 MG 24 hr capsule  Has the patient contacted their pharmacy? Yes  Preferred Pharmacy (with phone number or street name): [id number] [place], [place]. AT [address] [phone] (Phone) [phone] (Fax)    Agent: Please be advised that RX refills may take up to 3 business days. We ask that you follow-up with your pharmacy.

## 2018-01-18 ENCOUNTER — Ambulatory Visit: Payer: BC Managed Care – PPO | Admitting: Family Medicine

## 2018-01-18 NOTE — Telephone Encounter (Signed)
Pt called to check status of RX. Pt will be out on 01/19/18. Please advise.   [id number] [place] [place] [address] [place]. AT [address]

## 2018-01-20 NOTE — Telephone Encounter (Signed)
Patient calling to check status of getting medication. States that she has been waiting for 3 days and since it was not sent today that will be a 4th day. Patient states that she does not understand what is taking so long. Advised her that me messages were sent and the provider is working on this, but since the office was closed for the day that I would have to send a message and see if they could get that taken care of tomorrow. Patient very addiment that this be sent tomorrow. Please advise.

## 2018-01-20 NOTE — Telephone Encounter (Signed)
Pt called to inquire of getting medication refill for Adderall; contact to advise; pt is trying to at least get an update from anyone on what needs to be done; contact to advise

## 2018-01-20 NOTE — Telephone Encounter (Signed)
Dr Santiago please advise 

## 2018-01-21 ENCOUNTER — Other Ambulatory Visit: Payer: Self-pay | Admitting: Family Medicine

## 2018-01-21 MED ORDER — AMPHETAMINE-DEXTROAMPHET ER 20 MG PO CP24: 40.0000 mg | ORAL_CAPSULE | Freq: Every day | ORAL | 0 refills | Status: DC

## 2018-01-21 NOTE — Progress Notes (Signed)
PCP Dr Brigitte Pulse Last visit in June 2019 pmp reviewed Med refilled x 30 days Has appt scheduled for Feb 27 2018

## 2018-01-21 NOTE — Telephone Encounter (Signed)
Patient's concern/request has been addressed already by another provider or staff member. 

## 2018-01-24 MED ORDER — AMPHETAMINE-DEXTROAMPHET ER 20 MG PO CP24: 40.0000 mg | ORAL_CAPSULE | ORAL | 0 refills | Status: DC

## 2018-01-24 NOTE — Telephone Encounter (Signed)
Patient informed of 11/3 prescription on file with pharmacy by my chart

## 2018-01-24 NOTE — Addendum Note (Signed)
Addended by: Shawnee Knapp on: 01/24/2018 02:06 PM   Modules accepted: Orders

## 2018-01-24 NOTE — Telephone Encounter (Signed)
Dr. Pamella Pert sent in 1 month prescription for patient on 10/5.  However patient's next office visit with me is not scheduled until 11/11.  Sent in second prescription to pharmacy to be held on file which can be filled on or after 11/3.

## 2018-01-24 NOTE — Progress Notes (Signed)
Spoke with someone at Dr. Lorie Apley office. They are sending the records.

## 2018-01-27 ENCOUNTER — Encounter: Payer: Self-pay | Admitting: Family Medicine

## 2018-02-02 ENCOUNTER — Telehealth: Payer: Self-pay | Admitting: Family Medicine

## 2018-02-02 NOTE — Telephone Encounter (Signed)
Called patient in regards to her appt with Dr. Brigitte Pulse on 02/27/2018. The templates have changed and we needed to reschedule her appt. Did not leave a VM because no updated dpr on file.

## 2018-02-07 ENCOUNTER — Encounter: Payer: Self-pay | Admitting: Family Medicine

## 2018-02-07 ENCOUNTER — Telehealth: Payer: Self-pay | Admitting: Family Medicine

## 2018-02-07 NOTE — Telephone Encounter (Signed)
abstract

## 2018-02-09 NOTE — Telephone Encounter (Signed)
done

## 2018-02-27 ENCOUNTER — Ambulatory Visit: Payer: Self-pay | Admitting: Family Medicine

## 2018-03-07 ENCOUNTER — Other Ambulatory Visit: Payer: Self-pay | Admitting: Family Medicine

## 2018-03-25 ENCOUNTER — Other Ambulatory Visit: Payer: Self-pay | Admitting: Family Medicine

## 2018-03-27 ENCOUNTER — Telehealth: Payer: Self-pay | Admitting: Family Medicine

## 2018-03-27 NOTE — Telephone Encounter (Signed)
Copied from [place] [phone]. Topic: General - Other >> Mar 27, 2018  2:08 PM Lennox Solders wrote: Reason for CRM: pt needs a refill on generic adderall xr 20 mg. Pt has an appt on 04/04/18 with dr Brigitte Pulse. [facility] [address]

## 2018-03-28 NOTE — Telephone Encounter (Signed)
Pt called again about the refill on her  adderall xr 20 .  She stated it was urgent as she was out of this med    [facility]  [id number] [place], [place]. AT [address] [phone] (Phone)

## 2018-03-31 NOTE — Telephone Encounter (Signed)
Relation to pt: self  Call back number: [phone] Pharmacy:  [id number] [place]. AT [address]. [place] [address]  Phone: [phone] Fax: [phone]  Not a 24 hour pharmacy; exact hours not known.    Reason for call:  Patient checking on the status of medication refill request, stating the turnaround time has been more then 72 hour turn around, advised patient PCP is in office currently in clinic. Patient would like a follow up call today regarding the status.

## 2018-03-31 NOTE — Telephone Encounter (Signed)
Please refill if appropriate

## 2018-04-04 ENCOUNTER — Telehealth: Payer: Self-pay | Admitting: *Deleted

## 2018-04-04 ENCOUNTER — Ambulatory Visit (INDEPENDENT_AMBULATORY_CARE_PROVIDER_SITE_OTHER): Payer: BC Managed Care – PPO | Admitting: Family Medicine

## 2018-04-04 ENCOUNTER — Encounter

## 2018-04-04 ENCOUNTER — Encounter: Payer: Self-pay | Admitting: Family Medicine

## 2018-04-04 ENCOUNTER — Other Ambulatory Visit: Payer: Self-pay

## 2018-04-04 VITALS — BP 138/82 | HR 89 | Temp 98.0°F | Resp 16 | Ht 65.55 in | Wt 185.0 lb

## 2018-04-04 DIAGNOSIS — F418 Other specified anxiety disorders: Secondary | ICD-10-CM

## 2018-04-04 DIAGNOSIS — F900 Attention-deficit hyperactivity disorder, predominantly inattentive type: Secondary | ICD-10-CM

## 2018-04-04 DIAGNOSIS — Z23 Encounter for immunization: Secondary | ICD-10-CM | POA: Diagnosis not present

## 2018-04-04 DIAGNOSIS — E559 Vitamin D deficiency, unspecified: Secondary | ICD-10-CM

## 2018-04-04 DIAGNOSIS — I10 Essential (primary) hypertension: Secondary | ICD-10-CM

## 2018-04-04 MED ORDER — LISDEXAMFETAMINE DIMESYLATE 50 MG PO CAPS: 50.0000 mg | ORAL_CAPSULE | Freq: Every day | ORAL | 0 refills | Status: DC

## 2018-04-04 MED ORDER — BUSPIRONE HCL 10 MG PO TABS
ORAL_TABLET | ORAL | 0 refills | Status: DC
Start: 2018-04-04 — End: 2018-06-29

## 2018-04-04 MED ORDER — ESCITALOPRAM OXALATE 20 MG PO TABS: 40.0000 mg | ORAL_TABLET | Freq: Every day | ORAL | 0 refills | Status: DC

## 2018-04-04 NOTE — Progress Notes (Signed)
Subjective:    Patient: Catherine Cole  DOB: [birth year]; 62 y.o.   MRN: [id number]  Chief Complaint  Patient presents with  . ADHD    follow-up   . Medication Management    HPI    drug database shows that patient was changed from Adderall IR 10 mg 3 times daily ED which she last received in on May 1 2 Adderall ER 20 mg twice daily #60 on May 22 which she filled on that day 6/28, 8/6, 9/2, 10/5, and 11/4.   Feels like mood is good and the adderall is overall great.  There are some days that she feels like it is not last longing enough - she is taking both of them at 7-8 am. Initially was really hitting her hard   Irregularly taking vit D 1000iu  Sister ill w/ pna x 2 wks but has asthma  Medical History Past Medical History:  Diagnosis Date  . Adult ADHD   . Anemia   . Anxiety   . Depression   . Hypertension   . IBS (irritable bowel syndrome)    Past Surgical History:  Procedure Laterality Date  . APPENDECTOMY     12 yrs  . CHOLECYSTECTOMY  1995  . CHOLESTEATOMA EXCISION     13 and 22 yrs  . FRACTURE SURGERY    . [place]  . 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 TONSILLECTOMY     15 yrs   Current Outpatient Medications on File Prior to Visit  Medication Sig Dispense Refill  . amphetamine-dextroamphetamine (ADDERALL XR) 20 MG 24 hr capsule Take 2 capsules (40 mg total) by mouth daily. 60 capsule 0  . amphetamine-dextroamphetamine (ADDERALL XR) 20 MG 24 hr capsule Take 2 capsules (40 mg total) by mouth every morning. 60 capsule 0  . B Complex Vitamins (VITAMIN B COMPLEX) TABS Take by mouth.    . escitalopram (LEXAPRO) 20 MG tablet TAKE 2 TABLETS BY MOUTH EVERY DAY 60 tablet 0  . hydrochlorothiazide (HYDRODIURIL) 25 MG tablet TAKE 1 TABLET BY MOUTH EVERY DAY 90 tablet 3   . Incontinence Supply Disposable (DEPEND EASY FIT UNDERGARMENTS) MISC Use daily as directed. Change several times daily as needed. 120 each 99  . Melatonin 5 MG TABS Take 1 tablet (5 mg total) by mouth at bedtime. 90 tablet 3  . Multiple Vitamin (MULTIVITAMIN WITH MINERALS) TABS tablet Take 1 tablet by mouth daily.    . valACYclovir (VALTREX) 500 MG tablet TAKE 1 TABLET BY MOUTH EVERY DAY OR AS DIRECTED. 30 tablet 0   No current facility-administered medications on file prior to visit.    Allergies  Allergen Reactions  . Demerol Nausea And Vomiting  . Codeine Nausea And Vomiting  . Erythromycin Itching and Rash  . Neosporin [Neomycin-Polymyxin B Gu] Itching and Rash   Family History  Problem Relation Age of Onset  . Heart disease Mother 6       Heart attack  . Alcohol abuse Father   . Diabetes Father    Social History   Socioeconomic History  . Marital status: Widowed    Spouse name: Not on file  . Number of children: Not on file  . Years of education: Not on file  . Highest education level: Not on file  Occupational History  . Not  on file  Social Needs  . Financial resource strain: Not on file  . Food insecurity:    Worry: Not on file    Inability: Not on file  . Transportation needs:    Medical: Not on file    Non-medical: Not on file  Tobacco Use  . Smoking status: Former Research scientist (life sciences)  . Smokeless tobacco: Never Used  Substance and Sexual Activity  . Alcohol use: No  . Drug use: No  . Sexual activity: Never  Lifestyle  . Physical activity:    Days per week: Not on file    Minutes per session: Not on file  . Stress: Not on file  Relationships  . Social connections:    Talks on phone: Not on file    Gets together: Not on file    Attends religious service: Not on file    Active member of club or organization: Not on file    Attends meetings of clubs or organizations: Not on file    Relationship status: Not on file  Other Topics Concern  . Not on file  Social  History Narrative  . Not on file   Depression screen [facility] 2/9 04/04/2018 10/12/2017 09/07/2017 06/02/2017 05/02/2017  Decreased Interest 0 0 0 1 0  Down, Depressed, Hopeless 0 0 0 1 0  PHQ - 2 Score 0 0 0 2 0  Altered sleeping - - - 1 -  Tired, decreased energy - - - 1 -  Change in appetite - - - 0 -  Feeling bad or failure about yourself  - - - 1 -  Trouble concentrating - - - 0 -  Moving slowly or fidgety/restless - - - 0 -  Suicidal thoughts - - - 0 -  PHQ-9 Score - - - 5 -  Difficult doing work/chores - - - - -    ROS As noted in HPI  Objective:  BP 138/82   Pulse 89   Temp 98 F (36.7 C) (Oral)   Resp 16   Ht 5' 5.55" (1.665 m)   Wt 185 lb (83.9 kg)   SpO2 97%   BMI 30.27 kg/m  Physical Exam Constitutional:      General: She is not in acute distress.    Appearance: She is well-developed. She is not diaphoretic.  HENT:     Head: Normocephalic and atraumatic.     Right Ear: External ear normal.     Left Ear: External ear normal.  Eyes:     General: No scleral icterus.    Conjunctiva/sclera: Conjunctivae normal.  Neck:     Musculoskeletal: Normal range of motion and neck supple.     Thyroid: No thyromegaly.  Cardiovascular:     Rate and Rhythm: Normal rate and regular rhythm.     Heart sounds: Normal heart sounds. No gallop.   Pulmonary:     Effort: Pulmonary effort is normal. No respiratory distress.     Breath sounds: Normal breath sounds.  Lymphadenopathy:     Cervical: No cervical adenopathy.  Skin:    General: Skin is warm and dry.     Findings: No erythema.  Neurological:     Mental Status: She is alert and oriented to person, place, and time.  Psychiatric:        Behavior: Behavior normal.      Assessment & Plan:   1. Attention deficit hyperactivity disorder (ADHD), predominantly inattentive type  - has been on adderall XR 40mg  qd - feeling like not  as effective and tired at end of day so will try changing to vyvanse 50mg   2. Essential  hypertension, benign - continhdctz 25  3. Vitamin D deficiency   4. Depression with anxiety - maxed out on lexapro 40 - add in buspirone and titrate up    Recheck in 3 mos for additional refills.  Patient will continue on current chronic medications other than changes noted above, so ok to refill when needed.   See after visit summary for patient specific instructions.  Orders Placed This Encounter  Procedures  . Flu Vaccine QUAD 36+ mos IM  . ToxASSURE Select 13 (MW), Urine    Meds ordered this encounter  Medications  . lisdexamfetamine (VYVANSE) 50 MG capsule    Sig: Take 1 capsule (50 mg total) by mouth daily.    Dispense:  30 capsule    Refill:  0  . escitalopram (LEXAPRO) 20 MG tablet    Sig: Take 2 tablets (40 mg total) by mouth daily.    Dispense:  180 tablet    Refill:  0  . busPIRone (BUSPAR) 10 MG tablet    Sig: Take 1/2 tab bid x 4d, then 1 tab po bid x 1 wk, then 1 tab po tid    Dispense:  270 tablet    Refill:  0    Patient verbalized to me that they understand the following: diagnosis, what is being done for them, what to expect and what should be done at home.  Their questions have been answered. They understand that I am unable to predict every possible medication interaction or adverse outcome and that if any unexpected symptoms arise, they should contact us and their pharmacist, as well as never hesitate to seek urgent/emergent care at [facility] or ER if they think it might be warranted.    [name], MD, MPH Primary Care at [place] [place], [place]  [address] [phone] Office phone  [phone] Office fax  04/04/18 8:59 AM

## 2018-04-04 NOTE — Patient Instructions (Signed)
° ° ° °  If you have lab work done today you will be contacted with your lab results within the next 2 weeks.  If you have not heard from us then please contact us. The fastest way to get your results is to register for My Chart. ° ° °IF you received an x-ray today, you will receive an invoice from [facility]. Please contact [facility] at [phone] with questions or concerns regarding your invoice.  ° °IF you received labwork today, you will receive an invoice from [facility]. Please contact [facility] at [phone] with questions or concerns regarding your invoice.  ° °Our billing staff will not be able to assist you with questions regarding bills from these companies. ° °You will be contacted with the lab results as soon as they are available. The fastest way to get your results is to activate your My Chart account. Instructions are located on the last page of this paperwork. If you have not heard from us regarding the results in 2 weeks, please contact this office. °  ° ° ° °

## 2018-04-04 NOTE — Telephone Encounter (Signed)
covery my meds [id number]

## 2018-04-05 ENCOUNTER — Telehealth: Payer: Self-pay | Admitting: Family Medicine

## 2018-04-05 NOTE — Telephone Encounter (Signed)
Vyvanse was approved by her insurance company. Called the pharmacy to let them know to go ahead and fill it.

## 2018-04-09 LAB — TOXASSURE SELECT 13 (MW), URINE

## 2018-05-01 ENCOUNTER — Other Ambulatory Visit: Payer: Self-pay | Admitting: Family Medicine

## 2018-05-01 NOTE — Telephone Encounter (Signed)
Copied from [place] [id number]. Topic: Quick Communication - Rx Refill/Question >> May 01, 2018  9:51 AM Reyne Dumas L wrote: Medication: lisdexamfetamine (VYVANSE) 50 MG capsule  Pt states Dr. Brigitte Pulse changed how pt was taking this at last OV and she was told to call in and report on that before requesting a refill.  Pt states she is currently taking 1 pill a day in the morning.  Pt states that is working well for her.  Pt states that it has been almost three weeks and so far so good.  Pt states that this is better than what she was doing because the other wasn't lasting.  Pt would like for a 90 day supply to be called in if possible to help taking advantage of insurance.  Has the patient contacted their pharmacy? No - see above (Agent: If no, request that the patient contact the pharmacy for the refill.) (Agent: If yes, when and what did the pharmacy advise?)  Preferred Pharmacy (with phone number or street name): [id number] [place], [place]. AT [address] [phone] (Phone) [phone] (Fax)  Agent: Please be advised that RX refills may take up to 3 business days. We ask that you follow-up with your pharmacy.

## 2018-05-04 NOTE — Telephone Encounter (Signed)
Pt calling to check status. Please advise  °

## 2018-05-05 MED ORDER — LISDEXAMFETAMINE DIMESYLATE 50 MG PO CAPS: 50.0000 mg | ORAL_CAPSULE | Freq: Every day | ORAL | 0 refills | Status: DC

## 2018-05-05 NOTE — Addendum Note (Signed)
Addended by: Rutherford Guys on: 05/05/2018 06:31 PM   Modules accepted: Orders

## 2018-05-05 NOTE — Telephone Encounter (Signed)
Patient is calling to check on the status of her Vyvanse.  She was advised that it was routed to Dr. Pamella Pert this morning.  Awaiting Dr. Ardyth Gal approval. Paitent said that she would call back this afternoon. Please advise.

## 2018-05-05 NOTE — Telephone Encounter (Signed)
Called pt and informed her that the medication has been sent to pharmacy.

## 2018-05-05 NOTE — Telephone Encounter (Signed)
Please Advise

## 2018-05-05 NOTE — Telephone Encounter (Signed)
I have received the message form the [facility] and the pt will be out of medication today.  Please advise on refill.  Pt was last seen on 04/04/2018

## 2018-06-05 ENCOUNTER — Telehealth: Payer: Self-pay | Admitting: Family Medicine

## 2018-06-05 NOTE — Telephone Encounter (Signed)
Rx sent on 06/04/2018

## 2018-06-05 NOTE — Telephone Encounter (Signed)
Vyvanse refill request    Pt of Dr. Brigitte Pulse

## 2018-06-05 NOTE — Telephone Encounter (Signed)
Copied from [place] [id number] [phone]. Topic: Quick Communication - Rx Refill/Question >> Jun 05, 2018  8:21 AM Lionel December wrote: Medication: lisdexamfetamine (VYVANSE) 50 MG capsule  Has the patient contacted their pharmacy? Yes.   (Agent: If no, request that the patient contact the pharmacy for the refill.) (Agent: If yes, when and what did the pharmacy advise?)  Preferred Pharmacy (with phone number or street name): [id number] [place], [place]. AT [address] [place] [phone] (Phone) [phone] (Fax)    Agent: Please be advised that RX refills may take up to 3 business days. We ask that you follow-up with your pharmacy.

## 2018-06-08 ENCOUNTER — Telehealth: Payer: Self-pay

## 2018-06-08 NOTE — Telephone Encounter (Signed)
Pt is able to pick up the Vyvanse today after having to wait a few extra days

## 2018-06-17 ENCOUNTER — Telehealth: Payer: Self-pay | Admitting: Family Medicine

## 2018-06-17 NOTE — Telephone Encounter (Signed)
mychart message sent to pt about their appointment with Dr Shaw °

## 2018-06-23 ENCOUNTER — Telehealth: Payer: Self-pay | Admitting: Family Medicine

## 2018-06-23 NOTE — Telephone Encounter (Signed)
Copied from [place] [id number]. Topic: Appointment Scheduling - Scheduling Inquiry for Clinic >> Jun 23, 2018  2:23 PM Scherrie Gerlach wrote: Reason for CRM: pt must be seen every 3 months for med management.  Pt had appt with Dr Brigitte Pulse on 3/11.  Pt needs appt aound that time.  With the new scheduling guidelines, dr Pamella Pert does not have any availability this month. Please advise if ok to work in.

## 2018-06-26 ENCOUNTER — Telehealth: Payer: Self-pay | Admitting: Family Medicine

## 2018-06-26 NOTE — Telephone Encounter (Signed)
Pt has been called about an appointment and she will call office back to confirm appointment.

## 2018-06-26 NOTE — Telephone Encounter (Signed)
Called pt to let her know that we can open appt per [place] for med refill follow up for 06/27/2018 left VM for her to call back to schedule FR

## 2018-06-28 ENCOUNTER — Ambulatory Visit: Payer: BC Managed Care – PPO | Admitting: Family Medicine

## 2018-06-29 ENCOUNTER — Other Ambulatory Visit: Payer: Self-pay

## 2018-06-29 ENCOUNTER — Encounter: Payer: Self-pay | Admitting: Family Medicine

## 2018-06-29 ENCOUNTER — Ambulatory Visit: Payer: BC Managed Care – PPO | Admitting: Family Medicine

## 2018-06-29 VITALS — BP 144/82 | HR 92 | Temp 98.9°F | Ht 65.55 in | Wt 183.0 lb

## 2018-06-29 DIAGNOSIS — K580 Irritable bowel syndrome with diarrhea: Secondary | ICD-10-CM

## 2018-06-29 DIAGNOSIS — F900 Attention-deficit hyperactivity disorder, predominantly inattentive type: Secondary | ICD-10-CM | POA: Diagnosis not present

## 2018-06-29 DIAGNOSIS — E559 Vitamin D deficiency, unspecified: Secondary | ICD-10-CM

## 2018-06-29 DIAGNOSIS — F418 Other specified anxiety disorders: Secondary | ICD-10-CM

## 2018-06-29 DIAGNOSIS — I10 Essential (primary) hypertension: Secondary | ICD-10-CM | POA: Diagnosis not present

## 2018-06-29 DIAGNOSIS — E782 Mixed hyperlipidemia: Secondary | ICD-10-CM

## 2018-06-29 MED ORDER — HYDROCHLOROTHIAZIDE 25 MG PO TABS: 25.0000 mg | ORAL_TABLET | Freq: Every day | ORAL | 3 refills | Status: DC

## 2018-06-29 MED ORDER — LISDEXAMFETAMINE DIMESYLATE 50 MG PO CAPS: 50.0000 mg | ORAL_CAPSULE | Freq: Every day | ORAL | 0 refills | Status: DC

## 2018-06-29 MED ORDER — VALACYCLOVIR HCL 500 MG PO TABS: ORAL_TABLET | ORAL | 0 refills | Status: DC

## 2018-06-29 MED ORDER — BUSPIRONE HCL 10 MG PO TABS: 10.0000 mg | ORAL_TABLET | Freq: Two times a day (BID) | ORAL | 0 refills | Status: DC

## 2018-06-29 MED ORDER — DEPEND EASY FIT UNDERGARMENTS MISC: 99 refills | Status: AC

## 2018-06-29 MED ORDER — MELATONIN 5 MG PO TABS: 1.0000 | ORAL_TABLET | Freq: Every day | ORAL | 3 refills | Status: DC

## 2018-06-29 MED ORDER — ESCITALOPRAM OXALATE 20 MG PO TABS: 40.0000 mg | ORAL_TABLET | Freq: Every day | ORAL | 0 refills | Status: DC

## 2018-06-29 NOTE — Progress Notes (Signed)
3/12/20205:20 PM  Catherine Cole 1956-01-16, 63 y.o. female [id number]  Chief Complaint  Patient presents with  . ADHD    Follow up on medication, feels medication is doing well. Asking for a 3 month supply on the medcation. Also needs valtrex and Depends    HPI:   Patient is a 63 y.o. female with past medical history significant for ADD, HTN, depression with anxiety and urinary incontience who presents today for followup  Last PCP Dr Brigitte Pulse Last OV Dec 2019 Added buspar, changed to vyvanse PMP reviewed today UDS done in dec 2019  Mostly takes vyvanse mostly when working or on busy weekends Overall feels it is working better than aderral She is sleeping well unless she takes it too late in the morning She is doing buspar 10mg  BID, anxiety is much improved Still on lexapro  Patient feels that elevated BP is from pain she is having near her rectum, she refuses me examining her, not sure if related to vyvanse  Many family members have ADD, overall other members have done better on concerta She has been on concerta about 15 years ago She wants to try vyvanse for another 3 months and then decide  Uses depends due to IBS - D flare ups  Takes valacyclovir as needed for fever blisters   Fall Risk  06/29/2018 04/04/2018 10/12/2017 09/07/2017 06/02/2017  Falls in the past year? 0 0 No No No  Number falls in past yr: 0 0 - - -  Injury with Fall? 0 0 - - -     Depression screen [facility] 2/9 06/29/2018 04/04/2018 10/12/2017  Decreased Interest 0 0 0  Down, Depressed, Hopeless 0 0 0  PHQ - 2 Score 0 0 0  Altered sleeping - - -  Tired, decreased energy - - -  Change in appetite - - -  Feeling bad or failure about yourself  - - -  Trouble concentrating - - -  Moving slowly or fidgety/restless - - -  Suicidal thoughts - - -  PHQ-9 Score - - -  Difficult doing work/chores - - -    Allergies  Allergen Reactions  . Demerol Nausea And Vomiting  . Codeine Nausea And Vomiting  .  Erythromycin Itching and Rash  . Neosporin [Neomycin-Polymyxin B Gu] Itching and Rash    Prior to Admission medications   Medication Sig Start Date End Date Taking? Authorizing Provider  B Complex Vitamins (VITAMIN B COMPLEX) TABS Take by mouth.   Yes [provider]  busPIRone (BUSPAR) 10 MG tablet Take 1/2 tab bid x 4d, then 1 tab po bid x 1 wk, then 1 tab po tid 04/04/18  Yes Shawnee Knapp, MD  escitalopram (LEXAPRO) 20 MG tablet Take 2 tablets (40 mg total) by mouth daily. 04/04/18  Yes Shawnee Knapp, MD  hydrochlorothiazide (HYDRODIURIL) 25 MG tablet TAKE 1 TABLET BY MOUTH EVERY DAY 03/08/18  Yes Shawnee Knapp, MD  Incontinence Supply Disposable (DEPEND EASY FIT UNDERGARMENTS) MISC Use daily as directed. Change several times daily as needed. 05/20/16  Yes Shawnee Knapp, MD  lisdexamfetamine (VYVANSE) 50 MG capsule Take 1 capsule (50 mg total) by mouth daily. 05/05/18  Yes Rutherford Guys, MD  Melatonin 5 MG TABS Take 1 tablet (5 mg total) by mouth at bedtime. 05/01/15  Yes Shawnee Knapp, MD  Multiple Vitamin (MULTIVITAMIN WITH MINERALS) TABS tablet Take 1 tablet by mouth daily.   Yes [provider]  valACYclovir (VALTREX) 500  MG tablet TAKE 1 TABLET BY MOUTH EVERY DAY OR AS DIRECTED. 09/18/15  Yes Shawnee Knapp, MD    Past Medical History:  Diagnosis Date  . Adult ADHD   . Anemia   . Anxiety   . Depression   . Hypertension   . IBS (irritable bowel syndrome)     Past Surgical History:  Procedure Laterality Date  . APPENDECTOMY     12 yrs  . CHOLECYSTECTOMY  1995  . CHOLESTEATOMA EXCISION     13 and 22 yrs  . FRACTURE SURGERY    . [place]  . 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 TONSILLECTOMY     15 yrs    Social History   Tobacco Use  . Smoking status: Former Research scientist (life sciences)  .  Smokeless tobacco: Never Used  Substance Use Topics  . Alcohol use: No    Family History  Problem Relation Age of Onset  . Heart disease Mother 38       Heart attack  . Alcohol abuse Father   . Diabetes Father     Review of Systems  Constitutional: Negative for chills and fever.  Respiratory: Negative for cough and shortness of breath.   Cardiovascular: Negative for chest pain, palpitations and leg swelling.  Gastrointestinal: Positive for diarrhea. Negative for abdominal pain, nausea and vomiting.   Per hpi  OBJECTIVE:  Blood pressure (!) 144/82, pulse 92, temperature 98.9 F (37.2 C), temperature source Oral, height 5' 5.55" (1.665 m), weight 183 lb (83 kg), SpO2 92 %. Body mass index is 29.94 kg/m.   BP Readings from Last 3 Encounters:  06/29/18 (!) 144/82  04/04/18 138/82  10/12/17 138/88    Physical Exam Vitals signs and nursing note reviewed.  Constitutional:      Appearance: She is well-developed.  HENT:     Head: Normocephalic and atraumatic.     Mouth/Throat:     Pharynx: No oropharyngeal exudate.  Eyes:     General: No scleral icterus.    Conjunctiva/sclera: Conjunctivae normal.     Pupils: Pupils are equal, round, and reactive to light.  Neck:     Musculoskeletal: Neck supple.  Cardiovascular:     Rate and Rhythm: Normal rate and regular rhythm.     Heart sounds: Normal heart sounds. No murmur. No friction rub. No gallop.   Pulmonary:     Effort: Pulmonary effort is normal.     Breath sounds: Normal breath sounds. No wheezing or rales.  Skin:    General: Skin is warm and dry.  Neurological:     Mental Status: She is alert and oriented to person, place, and time.      ASSESSMENT and PLAN  1. Attention deficit hyperactivity disorder (ADHD), predominantly inattentive type Controlled. Continue current regime for now, as elevated BP might be in response to vyvanse.  2. Essential hypertension, benign Above goal. ? Viyvanse? Home monitoring BP,  bring readings at next visit. Goal < 140/90  3. Depression with anxiety No concerns per history or exam. Routine HCM labs ordered. HCM reviewed/discussed. Anticipatory guidance regarding healthy weight, lifestyle and choices given.   4. Irritable bowel syndrome with diarrhea rx for depends given  5. Vitamin D deficiency Checking labs today, medications will be adjusted as needed.  - Vitamin D, 25-hydroxy; Future  6. Mixed  dyslipidemia Checking labs today, medications will be adjusted as needed.  - Lipid panel; Future - Comprehensive metabolic panel; Future  Other orders - escitalopram (LEXAPRO) 20 MG tablet; Take 2 tablets (40 mg total) by mouth daily. - busPIRone (BUSPAR) 10 MG tablet; Take 1 tablet (10 mg total) by mouth 2 (two) times daily. - hydrochlorothiazide (HYDRODIURIL) 25 MG tablet; Take 1 tablet (25 mg total) by mouth daily. - Incontinence Supply Disposable (DEPEND EASY FIT UNDERGARMENTS) MISC; Use daily as directed. Change several times daily as needed. - Melatonin 5 MG TABS; Take 1 tablet (5 mg total) by mouth at bedtime. - lisdexamfetamine (VYVANSE) 50 MG capsule; Take 1 capsule (50 mg total) by mouth daily before breakfast for 30 days. - lisdexamfetamine (VYVANSE) 50 MG capsule; Take 1 capsule (50 mg total) by mouth daily before breakfast for 30 days. - lisdexamfetamine (VYVANSE) 50 MG capsule; Take 1 capsule (50 mg total) by mouth daily before breakfast for 30 days. - valACYclovir (VALTREX) 500 MG tablet; TAKE 1 TABLET BY MOUTH EVERY DAY OR AS DIRECTED.  Return in about 3 months (around 09/29/2018).    Rutherford Guys, MD Primary Care at [place], [place] [address] Ph.  [phone] Fax [phone]

## 2018-06-29 NOTE — Patient Instructions (Signed)
° ° ° °  If you have lab work done today you will be contacted with your lab results within the next 2 weeks.  If you have not heard from us then please contact us. The fastest way to get your results is to register for My Chart. ° ° °IF you received an x-ray today, you will receive an invoice from [facility]. Please contact [facility] at [phone] with questions or concerns regarding your invoice.  ° °IF you received labwork today, you will receive an invoice from [facility]. Please contact [facility] at [phone] with questions or concerns regarding your invoice.  ° °Our billing staff will not be able to assist you with questions regarding bills from these companies. ° °You will be contacted with the lab results as soon as they are available. The fastest way to get your results is to activate your My Chart account. Instructions are located on the last page of this paperwork. If you have not heard from us regarding the results in 2 weeks, please contact this office. °  ° ° ° °

## 2018-07-21 ENCOUNTER — Other Ambulatory Visit: Payer: Self-pay | Admitting: Family Medicine

## 2018-07-22 ENCOUNTER — Other Ambulatory Visit: Payer: Self-pay | Admitting: Family Medicine

## 2018-07-29 ENCOUNTER — Other Ambulatory Visit: Payer: Self-pay | Admitting: Family Medicine

## 2018-07-31 NOTE — Addendum Note (Signed)
Addended by: Amado Coe on: 07/31/2018 06:45 PM   Modules accepted: Orders

## 2018-08-01 ENCOUNTER — Other Ambulatory Visit: Payer: Self-pay | Admitting: Family Medicine

## 2018-08-01 NOTE — Telephone Encounter (Signed)
Patient is requesting a refill of the following medications: Requested Prescriptions   Pending Prescriptions Disp Refills  . ARIPiprazole (ABILIFY) 5 MG tablet [Pharmacy Med Name: ARIPIPRAZOLE 5 MG TABLET] 90 tablet 1    Sig: TAKE 1 TABLET BY MOUTH EVERY DAY    Date of patient request: 08/01/2018 Last office visit:  06/29/2018 Date of last refill: 07/27/2018 Last refill amount: 30 tab  Follow up time period per chart: Return in about 3 months (around 09/29/2018).

## 2018-08-01 NOTE — Telephone Encounter (Signed)
Requested medication (s) are due for refill today: No  pharmacy requesting changes  Requested medication (s) are on the active medication list:yes  Last refill:  07/27/2018  Future visit scheduled:yes  Notes to clinic:  Not delegated    Requested Prescriptions  Pending Prescriptions Disp Refills   ARIPiprazole (ABILIFY) 5 MG tablet [Pharmacy Med Name: ARIPIPRAZOLE 5 MG TABLET] 90 tablet 1    Sig: TAKE 1 TABLET BY MOUTH EVERY DAY     Not Delegated - Psychiatry:  Antipsychotics - Second Generation (Atypical) - aripiprazole Failed - 08/01/2018 11:29 AM      Failed - This refill cannot be delegated      Passed - Valid encounter within last 6 months    Recent Outpatient Visits          1 month ago Attention deficit hyperactivity disorder (ADHD), predominantly inattentive type   [facility] at [facility], [name], MD   3 months ago Attention deficit hyperactivity disorder (ADHD), predominantly inattentive type   [facility] at [facility], Laurey Arrow, MD   9 months ago Annual physical exam   [facility] at [facility], Laurey Arrow, MD   10 months ago Attention deficit hyperactivity disorder (ADHD), predominantly inattentive type   [facility] at [facility], Laurey Arrow, MD   1 year ago Pain of left breast   [facility] at [facility], Laurey Arrow, MD      Future Appointments            In 1 month Rutherford Guys, MD Primary Care at [place], [facility]

## 2018-09-29 ENCOUNTER — Encounter: Payer: Self-pay | Admitting: Family Medicine

## 2018-09-29 ENCOUNTER — Ambulatory Visit (INDEPENDENT_AMBULATORY_CARE_PROVIDER_SITE_OTHER): Payer: BC Managed Care – PPO | Admitting: Family Medicine

## 2018-09-29 ENCOUNTER — Other Ambulatory Visit: Payer: Self-pay

## 2018-09-29 VITALS — BP 124/82 | HR 92 | Temp 98.8°F | Ht 65.55 in | Wt 188.0 lb

## 2018-09-29 DIAGNOSIS — F900 Attention-deficit hyperactivity disorder, predominantly inattentive type: Secondary | ICD-10-CM | POA: Diagnosis not present

## 2018-09-29 DIAGNOSIS — I10 Essential (primary) hypertension: Secondary | ICD-10-CM | POA: Diagnosis not present

## 2018-09-29 DIAGNOSIS — F418 Other specified anxiety disorders: Secondary | ICD-10-CM

## 2018-09-29 MED ORDER — LISDEXAMFETAMINE DIMESYLATE 50 MG PO CAPS: 50.0000 mg | ORAL_CAPSULE | Freq: Every day | ORAL | 0 refills | Status: DC

## 2018-09-29 MED ORDER — ESCITALOPRAM OXALATE 20 MG PO TABS: 40.0000 mg | ORAL_TABLET | Freq: Every day | ORAL | 0 refills | Status: DC

## 2018-09-29 MED ORDER — HYDROCHLOROTHIAZIDE 25 MG PO TABS: 25.0000 mg | ORAL_TABLET | Freq: Every day | ORAL | 3 refills | Status: DC

## 2018-09-29 NOTE — Progress Notes (Signed)
6/12/20204:14 PM  Catherine Cole 22-Apr-1955, 63 y.o., female [id number]  Chief Complaint  Patient presents with  . ADHD    stopped taking the abilify due to the tremors that is causes in the hands. She itches with the buspar. Wants to talk about getting back on the wellbutrin or prozac. Has tried many anxiety meds in the past. She does not drink alcohol.     HPI:   Patient is a 63 y.o. female with past medical history significant for ADD, HTN, depression with anxiety and urinary incontience who presents today for followup  Last OV March 2020 - no changes  Patient is concerned if she is starting to have issues with her vyvanse as her last BP was elevated, has not been checking her BP at home Otherwise she feels that vyvanse works ok for her ADD, still struggles with maintaining focus at times Denies chest pain, palpitations, SOB  Wonders is buspar is causing her itching, headahces and insomnia When she has stopped taking buspar sx resolve In the past she has done well on prozac and wellbutrin meds stopped when her husband died as she lost her insurance Years later she returned to school and resumed medications She goes to counseling every 2 weeks  Having issues at work, HR and legal involved Recently returned from an administrative suspension while under investigation   pmp reviewed  GAD 7 : Generalized Anxiety Score 09/29/2018  Nervous, Anxious, on Edge 3  Control/stop worrying 3  Worry too much - different things 3  Trouble relaxing 3  Restless 0  Easily annoyed or irritable 1  Afraid - awful might happen 0  Total GAD 7 Score 13     Fall Risk  09/29/2018 06/29/2018 04/04/2018 10/12/2017 09/07/2017  Falls in the past year? - 0 0 No No  Number falls in past yr: 0 0 0 - -  Injury with Fall? 0 0 0 - -     Depression screen [facility] 2/9 09/29/2018 06/29/2018 04/04/2018  Decreased Interest 0 0 0  Down, Depressed, Hopeless 0 0 0  PHQ - 2 Score 0 0 0  Altered sleeping  - - -  Tired, decreased energy - - -  Change in appetite - - -  Feeling bad or failure about yourself  - - -  Trouble concentrating - - -  Moving slowly or fidgety/restless - - -  Suicidal thoughts - - -  PHQ-9 Score - - -  Difficult doing work/chores - - -    Allergies  Allergen Reactions  . Demerol Nausea And Vomiting  . Codeine Nausea And Vomiting  . Erythromycin Itching and Rash  . Neosporin [Neomycin-Polymyxin B Gu] Itching and Rash    Prior to Admission medications   Medication Sig Start Date End Date Taking? Authorizing Provider  B Complex Vitamins (VITAMIN B COMPLEX) TABS Take by mouth.    [provider]  busPIRone (BUSPAR) 10 MG tablet Take 1 tablet (10 mg total) by mouth 2 (two) times daily. 06/29/18   Rutherford Guys, MD  escitalopram (LEXAPRO) 20 MG tablet Take 2 tablets (40 mg total) by mouth daily. 06/29/18   Rutherford Guys, MD  hydrochlorothiazide (HYDRODIURIL) 25 MG tablet Take 1 tablet (25 mg total) by mouth daily. 06/29/18   Rutherford Guys, MD  Incontinence Supply Disposable (DEPEND EASY FIT UNDERGARMENTS) MISC Use daily as directed. Change several times daily as needed. 06/29/18   Rutherford Guys, MD  lisdexamfetamine (VYVANSE) 50 MG capsule Take  1 capsule (50 mg total) by mouth daily before breakfast for 30 days. 07/29/18 08/28/18  Rutherford Guys, MD  Melatonin 5 MG TABS Take 1 tablet (5 mg total) by mouth at bedtime. 06/29/18   Rutherford Guys, MD  Multiple Vitamin (MULTIVITAMIN WITH MINERALS) TABS tablet Take 1 tablet by mouth daily.    [provider]  valACYclovir (VALTREX) 500 MG tablet TAKE 1 TABLET BY MOUTH EVERY DAY 07/22/18   Shawnee Knapp, MD    Past Medical History:  Diagnosis Date  . Adult ADHD   . Anemia   . Anxiety   . Depression   . Hypertension   . IBS (irritable bowel syndrome)     Past Surgical History:  Procedure Laterality Date  . APPENDECTOMY     12 yrs  . CHOLECYSTECTOMY  1995  . CHOLESTEATOMA EXCISION     13  and 22 yrs  . FRACTURE SURGERY    . [place]  . 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 TONSILLECTOMY     15 yrs    Social History   Tobacco Use  . Smoking status: Former Research scientist (life sciences)  . Smokeless tobacco: Never Used  Substance Use Topics  . Alcohol use: No    Family History  Problem Relation Age of Onset  . Heart disease Mother 57       Heart attack  . Alcohol abuse Father   . Diabetes Father     ROS Per hpi  OBJECTIVE:  Today's Vitals   09/29/18 1557  BP: 124/82  Pulse: 92  Temp: 98.8 F (37.1 C)  TempSrc: Oral  SpO2: 96%  Weight: 188 lb (85.3 kg)  Height: 5' 5.55" (1.665 m)   Body mass index is 30.76 kg/m.  Wt Readings from Last 3 Encounters:  09/29/18 188 lb (85.3 kg)  06/29/18 183 lb (83 kg)  04/04/18 185 lb (83.9 kg)    Physical Exam Vitals signs and nursing note reviewed.  Constitutional:      Appearance: She is well-developed.  HENT:     Head: Normocephalic and atraumatic.  Eyes:     General: No scleral icterus.    Conjunctiva/sclera: Conjunctivae normal.     Pupils: Pupils are equal, round, and reactive to light.  Neck:     Musculoskeletal: Neck supple.  Pulmonary:     Effort: Pulmonary effort is normal.  Skin:    General: Skin is warm and dry.  Neurological:     Mental Status: She is alert and oriented to person, place, and time.     ASSESSMENT and PLAN  1. Attention deficit hyperactivity disorder (ADHD), predominantly inattentive type 2. Depression with anxiety Not well controlled. D/c buspar as causing side effects. Exacerbated by work stressors, going to counseling. Referring to psych for medication management.  - Ambulatory referral to Psychiatry  3. Essential hypertension, benign Controlled. Continue current regime.   Other orders -  lisdexamfetamine (VYVANSE) 50 MG capsule; Take 1 capsule (50 mg total) by mouth daily before breakfast for 30 days. - escitalopram (LEXAPRO) 20 MG tablet; Take 2 tablets (40 mg total) by mouth daily. - hydrochlorothiazide (HYDRODIURIL) 25 MG tablet; Take 1 tablet (25 mg total) by mouth daily.  Return in about 6 months (around 03/31/2019).    Rutherford Guys, MD [facility] at [facility]  [place], [place] [address] Ph.  [phone] Fax [phone]

## 2018-09-29 NOTE — Patient Instructions (Signed)
° ° ° °  If you have lab work done today you will be contacted with your lab results within the next 2 weeks.  If you have not heard from us then please contact us. The fastest way to get your results is to register for My Chart. ° ° °IF you received an x-ray today, you will receive an invoice from [facility]. Please contact [facility] at [phone] with questions or concerns regarding your invoice.  ° °IF you received labwork today, you will receive an invoice from [facility]. Please contact [facility] at [phone] with questions or concerns regarding your invoice.  ° °Our billing staff will not be able to assist you with questions regarding bills from these companies. ° °You will be contacted with the lab results as soon as they are available. The fastest way to get your results is to activate your My Chart account. Instructions are located on the last page of this paperwork. If you have not heard from us regarding the results in 2 weeks, please contact this office. °  ° ° ° °

## 2018-11-10 ENCOUNTER — Other Ambulatory Visit: Payer: Self-pay

## 2018-11-10 ENCOUNTER — Telehealth: Payer: Self-pay | Admitting: Family Medicine

## 2018-11-10 DIAGNOSIS — F900 Attention-deficit hyperactivity disorder, predominantly inattentive type: Secondary | ICD-10-CM

## 2018-11-10 DIAGNOSIS — F418 Other specified anxiety disorders: Secondary | ICD-10-CM

## 2018-11-10 MED ORDER — LISDEXAMFETAMINE DIMESYLATE 50 MG PO CAPS: 50.0000 mg | ORAL_CAPSULE | Freq: Every day | ORAL | 0 refills | Status: DC

## 2018-11-10 NOTE — Addendum Note (Signed)
Addended by: Rutherford Guys on: 11/10/2018 06:26 PM   Modules accepted: Orders

## 2018-11-10 NOTE — Telephone Encounter (Signed)
pmp reviewed Med refilled Psych referral re-entered

## 2018-11-10 NOTE — Telephone Encounter (Signed)
Dr. Pamella Pert,   Ms. Hackmann is requesting a refill of her Vyvanse. It should show as pended.      Last refilled 09/29/2018  #30 Requested f/u 03/2019  I will re-enter the psych referral.   Thank you!   Faith Patricelli

## 2018-11-10 NOTE — Telephone Encounter (Signed)
Can she get a refill or do she need an appointment? Please advise

## 2018-11-10 NOTE — Telephone Encounter (Signed)
Copied from [place] [id number]. Topic: Quick Communication - Rx Refill/Question >> Nov 10, 2018  9:48 AM Erick Blinks wrote: Medication: lisdexamfetamine (VYVANSE) 50 MG capsule  Has the patient contacted their pharmacy? Yes.   (Agent: If no, request that the patient contact the pharmacy for the refill.) (Agent: If yes, when and what did the pharmacy advise?)  Preferred Pharmacy (with phone number or street name): [id number] [place], [place]. AT [address] [place] [address] Phone: [phone] Fax: [phone]    Agent: Please be advised that RX refills may take up to 3 business days. We ask that you follow-up with your pharmacy.

## 2018-11-10 NOTE — Telephone Encounter (Signed)
Patient is calling back regarding the status of the Vyvanse.   Patient states that Dr. Pamella Pert would refer her to a psychiatry for ADHD.  The referral has not be placed. The patient has not heard anything from psychiatry.   Patient was told if did not hear from the referral then Dr. Pamella Pert happily would refill the Vyvanse.  Patient is former patient of Dr. Brigitte Pulse and this is the second time that she has seen Dr. Pamella Pert. Patient feels that is the load too much for Dr. Pamella Pert? Previously the patient came into the office every 3 months. Now the patient was advised to come in every 67mos.  Patient has 1 more pill left  And she is going into the weekend.  [facility] [place] [address] [phone]

## 2018-12-11 ENCOUNTER — Telehealth: Payer: Self-pay | Admitting: Family Medicine

## 2018-12-11 NOTE — Telephone Encounter (Signed)
Medication Refill - Medication:   lisdexamfetamine (VYVANSE) 50 MG capsule(Expired)   Has the patient contacted their pharmacy? No - states she has no more refills left. (Agent: If no, request that the patient contact the pharmacy for the refill.) (Agent: If yes, when and what did the pharmacy advise?)  Preferred Pharmacy (with phone number or street name):  [id number] [place], [place]. AT [address] [phone] (Phone) [phone] (Fax)   Agent: Please be advised that RX refills may take up to 3 business days. We ask that you follow-up with your pharmacy.

## 2018-12-12 ENCOUNTER — Ambulatory Visit (INDEPENDENT_AMBULATORY_CARE_PROVIDER_SITE_OTHER): Payer: BC Managed Care – PPO | Admitting: Family Medicine

## 2018-12-12 ENCOUNTER — Encounter: Payer: Self-pay | Admitting: Family Medicine

## 2018-12-12 ENCOUNTER — Telehealth: Payer: Self-pay | Admitting: Family Medicine

## 2018-12-12 ENCOUNTER — Other Ambulatory Visit: Payer: Self-pay

## 2018-12-12 VITALS — BP 148/82 | HR 85 | Temp 99.1°F | Ht 65.55 in | Wt 190.0 lb

## 2018-12-12 DIAGNOSIS — F418 Other specified anxiety disorders: Secondary | ICD-10-CM | POA: Diagnosis not present

## 2018-12-12 DIAGNOSIS — E559 Vitamin D deficiency, unspecified: Secondary | ICD-10-CM | POA: Diagnosis not present

## 2018-12-12 DIAGNOSIS — E782 Mixed hyperlipidemia: Secondary | ICD-10-CM

## 2018-12-12 DIAGNOSIS — F900 Attention-deficit hyperactivity disorder, predominantly inattentive type: Secondary | ICD-10-CM

## 2018-12-12 DIAGNOSIS — Z23 Encounter for immunization: Secondary | ICD-10-CM | POA: Diagnosis not present

## 2018-12-12 DIAGNOSIS — I10 Essential (primary) hypertension: Secondary | ICD-10-CM

## 2018-12-12 MED ORDER — LISDEXAMFETAMINE DIMESYLATE 50 MG PO CAPS: 50.0000 mg | ORAL_CAPSULE | Freq: Every day | ORAL | 0 refills | Status: DC

## 2018-12-12 MED ORDER — HYDROCHLOROTHIAZIDE 25 MG PO TABS: 25.0000 mg | ORAL_TABLET | Freq: Every day | ORAL | 3 refills | Status: DC

## 2018-12-12 MED ORDER — BUSPIRONE HCL 10 MG PO TABS: 10.0000 mg | ORAL_TABLET | Freq: Every day | ORAL | 0 refills | Status: DC

## 2018-12-12 MED ORDER — ESCITALOPRAM OXALATE 20 MG PO TABS: 40.0000 mg | ORAL_TABLET | Freq: Every day | ORAL | 0 refills | Status: DC

## 2018-12-12 MED ORDER — VALACYCLOVIR HCL 500 MG PO TABS: ORAL_TABLET | ORAL | 3 refills | Status: DC

## 2018-12-12 NOTE — Progress Notes (Signed)
8/25/202010:04 AM  Catherine Cole 28-Dec-1955, 63 y.o., female [id number]  Chief Complaint  Patient presents with  . Medication Refill    te buspar she has deecided to take. She takes it at night just 1 tab, vyvanse, lexapro, valtrex , htz    HPI:   Patient is a 63 y.o. 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 pmp reviewed  Is not taking OTC vitamin d nor calcium  BP Readings from Last 3 Encounters:  12/12/18 (!) 148/82  09/29/18 124/82  06/29/18 (!) 144/82   GAD 7 : Generalized Anxiety Score 09/29/2018 09/29/2018  Nervous, Anxious, on Edge 3 3  Control/stop worrying 3 3  Worry too much - different things 3 3  Trouble relaxing 3 3  Restless 0 0  Easily annoyed or irritable 1 1  Afraid - awful might happen 0 0  Total GAD 7 Score 13 13     Depression screen [facility] 2/9 12/12/2018 09/29/2018 06/29/2018  Decreased Interest 0 0 0  Down, Depressed, Hopeless 0 0 0  PHQ - 2 Score 0 0 0  Altered sleeping - - -  Tired, decreased energy - - -  Change in appetite - - -  Feeling bad or failure about yourself  - - -  Trouble concentrating - - -  Moving slowly or fidgety/restless - - -  Suicidal thoughts - - -  PHQ-9 Score - - -  Difficult doing work/chores - - -  Some recent data might be hidden    Fall Risk  12/12/2018 09/29/2018 06/29/2018 04/04/2018 10/12/2017  Falls in the past year? 0 - 0 0 No  Number falls in past yr: 0 0 0 0 -  Injury with Fall? 0 0 0 0 -     Allergies  Allergen Reactions  . Demerol Nausea  And Vomiting  . Codeine Nausea And Vomiting  . Erythromycin Itching and Rash  . Neosporin [Neomycin-Polymyxin B Gu] Itching and Rash    Prior to Admission medications   Medication Sig Start Date End Date Taking? Authorizing Provider  B Complex Vitamins (VITAMIN B COMPLEX) TABS Take by mouth.   Yes [provider]  busPIRone (BUSPAR) 10 MG tablet Take 10 mg by mouth daily at 8 pm.   Yes [provider]  escitalopram (LEXAPRO) 20 MG tablet Take 2 tablets (40 mg total) by mouth daily. 09/29/18  Yes Rutherford Guys, MD  hydrochlorothiazide (HYDRODIURIL) 25 MG tablet Take 1 tablet (25 mg total) by mouth daily. 09/29/18  Yes Rutherford Guys, MD  Incontinence Supply Disposable (DEPEND EASY FIT UNDERGARMENTS) MISC Use daily as directed. Change several times daily as needed. 06/29/18  Yes Rutherford Guys, MD  Multiple Vitamin (MULTIVITAMIN WITH MINERALS) TABS tablet Take 1 tablet by mouth daily.   Yes [provider]  valACYclovir (VALTREX) 500 MG tablet TAKE 1 TABLET BY MOUTH EVERY DAY 07/22/18  Yes Shawnee Knapp, MD  lisdexamfetamine (VYVANSE) 50 MG capsule Take 1 capsule (50 mg total) by mouth daily before breakfast. 11/10/18 12/10/18  Pamella Pert,  [name], MD    Past Medical History:  Diagnosis Date  . Adult ADHD   . Anemia   . Anxiety   . Depression   . Hypertension   . IBS (irritable bowel syndrome)     Past Surgical History:  Procedure Laterality Date  . APPENDECTOMY     12 yrs  . CHOLECYSTECTOMY  1995  . CHOLESTEATOMA EXCISION     13 and 22 yrs  . FRACTURE SURGERY    . [place]  . 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 TONSILLECTOMY     15 yrs    Social History   Tobacco Use  . Smoking status: Former Research scientist (life sciences)  . Smokeless tobacco: Never Used  Substance Use Topics   . Alcohol use: No    Family History  Problem Relation Age of Onset  . Heart disease Mother 43       Heart attack  . Alcohol abuse Father   . Diabetes Father     Review of Systems  Constitutional: Negative for chills and fever.  Respiratory: Negative for cough and shortness of breath.   Cardiovascular: Negative for chest pain, palpitations and leg swelling.  Gastrointestinal: Negative for abdominal pain, nausea and vomiting.   Per hpi  OBJECTIVE:  Today's Vitals   12/12/18 0955  BP: (!) 148/82  Pulse: 85  Temp: 99.1 F (37.3 C)  SpO2: 95%  Weight: 190 lb (86.2 kg)  Height: 5' 5.55" (1.665 m)   Body mass index is 31.09 kg/m.   Physical Exam Vitals signs and nursing note reviewed.  Constitutional:      Appearance: She is well-developed.  HENT:     Head: Normocephalic and atraumatic.     Mouth/Throat:     Pharynx: No oropharyngeal exudate.  Eyes:     General: No scleral icterus.    Conjunctiva/sclera: Conjunctivae normal.     Pupils: Pupils are equal, round, and reactive to light.  Neck:     Musculoskeletal: Neck supple.  Cardiovascular:     Rate and Rhythm: Normal rate and regular rhythm.     Heart sounds: Normal heart sounds. No murmur. No friction rub. No gallop.   Pulmonary:     Effort: Pulmonary effort is normal.     Breath sounds: Normal breath sounds. No wheezing or rales.  Skin:    General: Skin is warm and dry.  Neurological:     Mental Status: She is alert and oriented to person, place, and time.       No results found for this or any previous visit (from the past 24 hour(s)).  No results found.   ASSESSMENT and PLAN  1. Essential hypertension, benign Above goal. Patient very stressed today. Prior reading at goal. Re-eval at next OV. - Care order/instruction: - Comprehensive metabolic panel  2. Vitamin D deficiency Start OTC D3 2000 units a day. Checking labs today for baseline. Recheck labs at next OV.  - Vitamin D, 25-hydroxy  3.  Mixed dyslipidemia Checking labs today, medications will be adjusted as needed.  - Lipid panel  4. Depression with anxiety Stable but not well controlled. Patient will schedule her own appt with psych. meds will be refilled until appt.  5. Attention deficit hyperactivity disorder (ADHD), predominantly inattentive type Stable but not well controlled. Patient will schedule her own  appt with psych. meds will be refilled until appt.  6. Need for prophylactic vaccination and inoculation against influenza - Flu Vaccine QUAD 36+ mos IM  Other orders - busPIRone (BUSPAR) 10 MG tablet; Take 1 tablet (10 mg total) by mouth at bedtime. - escitalopram (LEXAPRO) 20 MG tablet; Take 2 tablets (40 mg total) by mouth daily. - hydrochlorothiazide (HYDRODIURIL) 25 MG tablet; Take 1 tablet (25 mg total) by mouth daily. - lisdexamfetamine (VYVANSE) 50 MG capsule; Take 1 capsule (50 mg total) by mouth daily before breakfast. - lisdexamfetamine (VYVANSE) 50 MG capsule; Take 1 capsule (50 mg total) by mouth daily. Ok to fill 30 days after signature - lisdexamfetamine (VYVANSE) 50 MG capsule; Take 1 capsule (50 mg total) by mouth daily. Ok to fill 60 days after signature - valACYclovir (VALTREX) 500 MG tablet; Take 2 tablets by mouth at acute onset for outbreak, followed by 1 tablet once a day x 5 days  Return in about 3 months (around 03/14/2019).    Rutherford Guys, MD Primary Care at [facility] [place], [place] [address] Ph.  [phone] Fax [phone]

## 2018-12-12 NOTE — Telephone Encounter (Signed)
Spoke to [facility] and they are going to call patient when they have an available provider that will except her none of their doctors are working on site

## 2018-12-12 NOTE — Telephone Encounter (Signed)
-----   Message from Rutherford Guys, MD sent at 12/12/2018  1:33 PM EDT ----- Regarding: referral to psychiatry Hello, I referred this patient twice to psychiatry and she states she never received a phone call. Can we please investigate? Thanks

## 2018-12-12 NOTE — Telephone Encounter (Signed)
Rx was sent to pharmacy. 

## 2018-12-12 NOTE — Patient Instructions (Signed)
° ° ° °  If you have lab work done today you will be contacted with your lab results within the next 2 weeks.  If you have not heard from us then please contact us. The fastest way to get your results is to register for My Chart. ° ° °IF you received an x-ray today, you will receive an invoice from [facility]. Please contact [facility] at [phone] with questions or concerns regarding your invoice.  ° °IF you received labwork today, you will receive an invoice from [facility]. Please contact [facility] at [phone] with questions or concerns regarding your invoice.  ° °Our billing staff will not be able to assist you with questions regarding bills from these companies. ° °You will be contacted with the lab results as soon as they are available. The fastest way to get your results is to activate your My Chart account. Instructions are located on the last page of this paperwork. If you have not heard from us regarding the results in 2 weeks, please contact this office. °  ° ° ° °

## 2018-12-13 LAB — LIPID PANEL
Chol/HDL Ratio: 4.3 ratio (ref 0.0–4.4)
Cholesterol, Total: 227 mg/dL — ABNORMAL HIGH (ref 100–199)
HDL: 53 mg/dL (ref >39)
LDL Calculated: 145 mg/dL — ABNORMAL HIGH (ref 0–99)
Triglycerides: 145 mg/dL (ref 0–149)
VLDL Cholesterol Cal: 29 mg/dL (ref 5–40)

## 2018-12-13 LAB — COMPREHENSIVE METABOLIC PANEL
ALT: 16 IU/L (ref 0–32)
AST: 14 IU/L (ref 0–40)
Albumin/Globulin Ratio: 2.2 (ref 1.2–2.2)
Albumin: 4.8 g/dL (ref 3.8–4.8)
Alkaline Phosphatase: 82 IU/L (ref 39–117)
BUN/Creatinine Ratio: 17 (ref 12–28)
BUN: 14 mg/dL (ref 8–27)
Bilirubin Total: 0.5 mg/dL (ref 0.0–1.2)
CO2: 25 mmol/L (ref 20–29)
Calcium: 9.9 mg/dL (ref 8.7–10.3)
Chloride: 97 mmol/L (ref 96–106)
Creatinine, Ser: 0.81 mg/dL (ref 0.57–1.00)
GFR calc Af Amer: 90 mL/min/{1.73_m2} (ref >59)
GFR calc non Af Amer: 78 mL/min/{1.73_m2} (ref >59)
Globulin, Total: 2.2 g/dL (ref 1.5–4.5)
Glucose: 101 mg/dL — ABNORMAL HIGH (ref 65–99)
Potassium: 3.7 mmol/L (ref 3.5–5.2)
Sodium: 140 mmol/L (ref 134–144)
Total Protein: 7 g/dL (ref 6.0–8.5)

## 2018-12-13 LAB — VITAMIN D 25 HYDROXY (VIT D DEFICIENCY, FRACTURES): Vit D, 25-Hydroxy: 13.6 ng/mL — ABNORMAL LOW (ref 30.0–100.0)

## 2018-12-28 MED ORDER — ATORVASTATIN CALCIUM 20 MG PO TABS: 20.0000 mg | ORAL_TABLET | Freq: Every day | ORAL | 3 refills | Status: DC

## 2018-12-28 MED ORDER — VITAMIN D (ERGOCALCIFEROL) 1.25 MG (50000 UNIT) PO CAPS: 50000.0000 [IU] | ORAL_CAPSULE | ORAL | 0 refills | Status: DC

## 2018-12-28 NOTE — Addendum Note (Signed)
Addended by: Rutherford Guys on: 12/28/2018 01:12 PM   Modules accepted: Orders

## 2019-02-07 ENCOUNTER — Encounter: Payer: Self-pay | Admitting: Family Medicine

## 2019-03-06 ENCOUNTER — Other Ambulatory Visit: Payer: Self-pay | Admitting: Family Medicine

## 2019-03-19 ENCOUNTER — Other Ambulatory Visit: Payer: Self-pay

## 2019-03-19 ENCOUNTER — Ambulatory Visit: Payer: BC Managed Care – PPO | Admitting: Family Medicine

## 2019-03-19 ENCOUNTER — Encounter: Payer: Self-pay | Admitting: Family Medicine

## 2019-03-19 VITALS — BP 132/74 | HR 85 | Temp 96.5°F | Ht 65.55 in | Wt 193.0 lb

## 2019-03-19 DIAGNOSIS — E782 Mixed hyperlipidemia: Secondary | ICD-10-CM

## 2019-03-19 DIAGNOSIS — F900 Attention-deficit hyperactivity disorder, predominantly inattentive type: Secondary | ICD-10-CM

## 2019-03-19 DIAGNOSIS — E559 Vitamin D deficiency, unspecified: Secondary | ICD-10-CM

## 2019-03-19 DIAGNOSIS — I10 Essential (primary) hypertension: Secondary | ICD-10-CM

## 2019-03-19 MED ORDER — LISDEXAMFETAMINE DIMESYLATE 50 MG PO CAPS: 50.0000 mg | ORAL_CAPSULE | Freq: Every day | ORAL | 0 refills | Status: DC

## 2019-03-19 MED ORDER — BUSPIRONE HCL 10 MG PO TABS: ORAL_TABLET | ORAL | 1 refills | Status: DC

## 2019-03-19 NOTE — Patient Instructions (Signed)
° ° ° °  If you have lab work done today you will be contacted with your lab results within the next 2 weeks.  If you have not heard from us then please contact us. The fastest way to get your results is to register for My Chart. ° ° °IF you received an x-ray today, you will receive an invoice from [facility]. Please contact [facility] at [phone] with questions or concerns regarding your invoice.  ° °IF you received labwork today, you will receive an invoice from [facility]. Please contact [facility] at [phone] with questions or concerns regarding your invoice.  ° °Our billing staff will not be able to assist you with questions regarding bills from these companies. ° °You will be contacted with the lab results as soon as they are available. The fastest way to get your results is to activate your My Chart account. Instructions are located on the last page of this paperwork. If you have not heard from us regarding the results in 2 weeks, please contact this office. °  ° ° ° °

## 2019-03-19 NOTE — Progress Notes (Signed)
11/30/20204:56 PM  Catherine Cole Nov 10, 1955, 63 y.o., female [id number]  Chief Complaint  Patient presents with  . Follow-up    has changed her eating habbit and has been excercising. Did misread the sig on the vid, took 1 day instead of the 1 weekly    HPI:   Patient is a 63 y.o. female with past medical history significant for ADD, HTN, depression with anxiety, vitamin D deficiency and urinary incontiencewho presents today for followup  Last OV Aug 2020 She meet with a nutritionist, working on diet re cholesterol She has also been exercising 3 x week She accidentally took vitamin D daily for several for weeks but now taking weekly She never made it to psychiatry Feels that she is doing much better regarding anxiety and depression ADD remains well controlled on current dose of vyvanse  Lab Results  Component Value Date   CREATININE 0.81 12/12/2018   BUN 14 12/12/2018   NA 140 12/12/2018   K 3.7 12/12/2018   CL 97 12/12/2018   CO2 25 12/12/2018    Depression screen PHQ 2/9 03/19/2019 12/12/2018 12/12/2018  Decreased Interest 0 0 0  Down, Depressed, Hopeless 0 0 0  PHQ - 2 Score 0 0 0  Altered sleeping - 1 -  Tired, decreased energy - 0 -  Change in appetite - 0 -  Feeling bad or failure about yourself  - 0 -  Trouble concentrating - 0 -  Moving slowly or fidgety/restless - 0 -  Suicidal thoughts - 0 -  PHQ-9 Score - 1 -  Difficult doing work/chores - Not difficult at all -  Some recent data might be hidden    Fall Risk  03/19/2019 12/12/2018 09/29/2018 06/29/2018 04/04/2018  Falls in the past year? 0 0 - 0 0  Number falls in past yr: 0 0 0 0 0  Injury with Fall? 0 0 0 0 0     Allergies  Allergen Reactions  . Demerol Nausea And Vomiting  . Codeine Nausea And Vomiting  . Erythromycin Itching and Rash  . Neosporin [Neomycin-Polymyxin B Gu] Itching and Rash    Prior to Admission medications   Medication Sig Start Date End Date Taking? Authorizing  Provider  busPIRone (BUSPAR) 10 MG tablet TAKE 1 TABLET BY MOUTH EVERYDAY AT BEDTIME 03/06/19  Yes Rutherford Guys, MD  escitalopram (LEXAPRO) 20 MG tablet Take 2 tablets (40 mg total) by mouth daily. 12/12/18  Yes Rutherford Guys, MD  hydrochlorothiazide (HYDRODIURIL) 25 MG tablet Take 1 tablet (25 mg total) by mouth daily. 12/12/18  Yes Rutherford Guys, MD  Incontinence Supply Disposable (DEPEND EASY FIT UNDERGARMENTS) MISC Use daily as directed. Change several times daily as needed. 06/29/18  Yes Rutherford Guys, MD  lisdexamfetamine (VYVANSE) 50 MG capsule Take 1 capsule (50 mg total) by mouth daily. Ok to fill 30 days after signature 12/12/18  Yes Rutherford Guys, MD  lisdexamfetamine (VYVANSE) 50 MG capsule Take 1 capsule (50 mg total) by mouth daily. Ok to fill 60 days after signature 12/12/18  Yes Rutherford Guys, MD  Multiple Vitamin (MULTIVITAMIN WITH MINERALS) TABS tablet Take 1 tablet by mouth daily.   Yes [provider]  valACYclovir (VALTREX) 500 MG tablet Take 2 tablets by mouth at acute onset for outbreak, followed by 1 tablet once a day x 5 days 12/12/18  Yes Rutherford Guys, MD  Vitamin D, Ergocalciferol, (DRISDOL) 1.25 MG (50000 UT) CAPS capsule Take 1 capsule (50,000  Units total) by mouth every 7 (seven) days. 12/28/18  Yes Rutherford Guys, MD  lisdexamfetamine (VYVANSE) 50 MG capsule Take 1 capsule (50 mg total) by mouth daily before breakfast. 12/12/18 01/11/19  Rutherford Guys, MD    Past Medical History:  Diagnosis Date  . Adult ADHD   . Anemia   . Anxiety   . Depression   . Hypertension   . IBS (irritable bowel syndrome)     Past Surgical History:  Procedure Laterality Date  . APPENDECTOMY     12 yrs  . CHOLECYSTECTOMY  1995  . CHOLESTEATOMA EXCISION     13 and 22 yrs  . FRACTURE SURGERY    . [place]  . LESION DESTRUCTION N/A 12/03/2015   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;  Surgeon: [name], DO;  Location: [facility];  Service: Plastics;  Laterality: N/A;  . TONSILLECTOMY     15 yrs    Social History   Tobacco Use  . Smoking status: Former Research scientist (life sciences)  . Smokeless tobacco: Never Used  Substance Use Topics  . Alcohol use: No    Family History  Problem Relation Age of Onset  . Heart disease Mother 19       Heart attack  . Alcohol abuse Father   . Diabetes Father     Review of Systems  Constitutional: Negative for chills and fever.  Respiratory: Negative for cough and shortness of breath.   Cardiovascular: Negative for chest pain, palpitations and leg swelling.  Gastrointestinal: Negative for abdominal pain, nausea and vomiting.  Psychiatric/Behavioral: The patient does not have insomnia.      OBJECTIVE:  Today's Vitals   03/19/19 1653  BP: 132/74  Pulse: 85  Temp: (!) 96.5 F (35.8 C)  SpO2: 94%  Weight: 193 lb (87.5 kg)  Height: 5' 5.55" (1.665 m)   Body mass index is 31.58 kg/m.    Physical Exam Vitals signs and nursing note reviewed.  Constitutional:      Appearance: She is well-developed.  HENT:     Head: Normocephalic and atraumatic.     Mouth/Throat:     Pharynx: No oropharyngeal exudate.  Eyes:     General: No scleral icterus.    Conjunctiva/sclera: Conjunctivae normal.     Pupils: Pupils are equal, round, and reactive to light.  Neck:     Musculoskeletal: Neck supple.  Cardiovascular:     Rate and Rhythm: Normal rate and regular rhythm.     Heart sounds: Normal heart sounds. No murmur. No friction rub. No gallop.   Pulmonary:     Effort: Pulmonary effort is normal.     Breath sounds: Normal breath sounds. No wheezing or rales.  Skin:    General: Skin is warm and dry.  Neurological:     Mental Status: She is alert and oriented to person, place, and time.     No results found for this or any previous visit (from the past 24 hour(s)).  No results found.   ASSESSMENT and PLAN  1. Essential  hypertension, benign Controlled. Continue current regime.   2. Mixed dyslipidemia Checking labs today, cont with LFM - Lipid panel - Comprehensive metabolic panel  3. Vitamin D deficiency Checking labs today, medications will be adjusted as needed.  - Vitamin D, 25-hydroxy  4. Attention deficit hyperactivity disorder (ADHD), predominantly inattentive type Controlled. Continue current regime. Call in 3 months for refills - ToxASSURE Select 13, Urine  Other orders - busPIRone (BUSPAR) 10 MG tablet; TAKE 1 TABLET BY MOUTH EVERYDAY AT BEDTIME - lisdexamfetamine (VYVANSE) 50 MG capsule; Take 1 capsule (50 mg total) by mouth daily. Ok to fill 30 days after signature - lisdexamfetamine (VYVANSE) 50 MG capsule; Take 1 capsule (50 mg total) by mouth daily. Ok to fill 60 days after signature - lisdexamfetamine (VYVANSE) 50 MG capsule; Take 1 capsule (50 mg total) by mouth daily before breakfast.  Return in about 6 months (around 09/16/2019).    Rutherford Guys, MD [facility] at [place], [place] [address] Ph.  [phone] Fax [phone]

## 2019-03-20 ENCOUNTER — Other Ambulatory Visit: Payer: Self-pay | Admitting: Family Medicine

## 2019-03-20 DIAGNOSIS — E876 Hypokalemia: Secondary | ICD-10-CM

## 2019-03-20 LAB — COMPREHENSIVE METABOLIC PANEL
ALT: 26 IU/L (ref 0–32)
AST: 22 IU/L (ref 0–40)
Albumin/Globulin Ratio: 1.8 (ref 1.2–2.2)
Albumin: 4.6 g/dL (ref 3.8–4.8)
Alkaline Phosphatase: 88 IU/L (ref 39–117)
BUN/Creatinine Ratio: 21 (ref 12–28)
BUN: 16 mg/dL (ref 8–27)
Bilirubin Total: 0.4 mg/dL (ref 0.0–1.2)
CO2: 24 mmol/L (ref 20–29)
Calcium: 9.7 mg/dL (ref 8.7–10.3)
Chloride: 98 mmol/L (ref 96–106)
Creatinine, Ser: 0.78 mg/dL (ref 0.57–1.00)
GFR calc Af Amer: 94 mL/min/{1.73_m2} (ref >59)
GFR calc non Af Amer: 81 mL/min/{1.73_m2} (ref >59)
Globulin, Total: 2.5 g/dL (ref 1.5–4.5)
Glucose: 116 mg/dL — ABNORMAL HIGH (ref 65–99)
Potassium: 3.4 mmol/L — ABNORMAL LOW (ref 3.5–5.2)
Sodium: 140 mmol/L (ref 134–144)
Total Protein: 7.1 g/dL (ref 6.0–8.5)

## 2019-03-20 LAB — LIPID PANEL
Chol/HDL Ratio: 4.1 ratio (ref 0.0–4.4)
Cholesterol, Total: 217 mg/dL — ABNORMAL HIGH (ref 100–199)
HDL: 53 mg/dL (ref >39)
LDL Chol Calc (NIH): 147 mg/dL — ABNORMAL HIGH (ref 0–99)
Triglycerides: 95 mg/dL (ref 0–149)
VLDL Cholesterol Cal: 17 mg/dL (ref 5–40)

## 2019-03-20 LAB — VITAMIN D 25 HYDROXY (VIT D DEFICIENCY, FRACTURES): Vit D, 25-Hydroxy: 29.7 ng/mL — ABNORMAL LOW (ref 30.0–100.0)

## 2019-03-20 MED ORDER — TRIAMTERENE-HCTZ 37.5-25 MG PO TABS: 1.0000 | ORAL_TABLET | Freq: Every day | ORAL | 3 refills | Status: DC

## 2019-03-21 LAB — TOXASSURE SELECT 13 (MW), URINE

## 2019-03-22 ENCOUNTER — Other Ambulatory Visit: Payer: Self-pay | Admitting: Family Medicine

## 2019-03-30 ENCOUNTER — Ambulatory Visit: Payer: BC Managed Care – PPO | Admitting: Family Medicine

## 2019-04-07 ENCOUNTER — Other Ambulatory Visit: Payer: Self-pay | Admitting: Family Medicine

## 2019-04-08 NOTE — Telephone Encounter (Signed)
Requested medication (s) are due for refill today: yes  Requested medication (s) are on the active medication list: yes  Last refill:  12/28/2018  Future visit scheduled: yes  Notes to clinic:  50,000 IU strengths are not delegated   Requested Prescriptions  Pending Prescriptions Disp Refills   Vitamin D, Ergocalciferol, (DRISDOL) 1.25 MG (50000 UT) CAPS capsule [Pharmacy Med Name: VITAMIN D2 1.25MG (50,000 UNIT)] 12 capsule 0    Sig: TAKE 1 CAPSULE BY MOUTH EVERY 7 DAYS      Endocrinology:  Vitamins - Vitamin D Supplementation Failed - 04/07/2019 10:45 AM      Failed - 50,000 IU strengths are not delegated      Failed - Phosphate in normal range and within 360 days    No results found for: PHOS        Failed - Vitamin D in normal range and within 360 days    Vit D, 25-Hydroxy  Date Value Ref Range Status  03/19/2019 29.7 (L) 30.0 - 100.0 ng/mL Final    Comment:    Vitamin D deficiency has been defined by the Institute of Medicine and an Endocrine Society practice guideline as a level of serum 25-OH vitamin D less than 20 ng/mL (1,2). The Endocrine Society went on to further define vitamin D insufficiency as a level between 21 and 29 ng/mL (2). 1. IOM (Institute of Medicine). 2010. Dietary reference    intakes for calcium and D. [place]: The    [employer]. 2. Holick MF, Binkley [place], Bischoff-Ferrari HA, et al.    Evaluation, treatment, and prevention of vitamin D    deficiency: an Endocrine Society clinical practice    guideline. JCEM. 2011 Jul; 96(7):1911-30.           Passed - Ca in normal range and within 360 days    Calcium  Date Value Ref Range Status  03/19/2019 9.7 8.7 - 10.3 mg/dL Final          Passed - Valid encounter within last 12 months    Recent Outpatient Visits           2 weeks ago Essential hypertension, benign   [facility], [name], MD   3 months ago Essential hypertension, benign   [facility], [name], MD   6 months ago Attention deficit hyperactivity disorder (ADHD), predominantly inattentive type   [facility], [name], MD   9 months ago Attention deficit hyperactivity disorder (ADHD), predominantly inattentive type   [facility], [name], MD   1 year ago Attention deficit hyperactivity disorder (ADHD), predominantly inattentive type   [facility], Laurey Arrow, MD       Future Appointments             In 5 months Rutherford Guys, MD [facility] at [place], [facility]

## 2019-05-17 ENCOUNTER — Other Ambulatory Visit: Payer: Self-pay

## 2019-05-17 ENCOUNTER — Telehealth (INDEPENDENT_AMBULATORY_CARE_PROVIDER_SITE_OTHER): Payer: Self-pay | Admitting: Family Medicine

## 2019-05-17 DIAGNOSIS — N644 Mastodynia: Secondary | ICD-10-CM

## 2019-05-17 DIAGNOSIS — Z853 Personal history of malignant neoplasm of breast: Secondary | ICD-10-CM

## 2019-05-17 DIAGNOSIS — Z1231 Encounter for screening mammogram for malignant neoplasm of breast: Secondary | ICD-10-CM

## 2019-05-17 DIAGNOSIS — F900 Attention-deficit hyperactivity disorder, predominantly inattentive type: Secondary | ICD-10-CM

## 2019-05-17 MED ORDER — LISDEXAMFETAMINE DIMESYLATE 50 MG PO CAPS: 50.0000 mg | ORAL_CAPSULE | Freq: Every day | ORAL | 0 refills | Status: DC

## 2019-05-17 NOTE — Progress Notes (Signed)
Called twice, no answer, no VM Last OV for ADD nov 2020  pmp reviewed and vyvanse refilled  Last mammo LEFT breast 2019, normal Last screening mammo 2018 mammo ordered

## 2019-05-17 NOTE — Progress Notes (Signed)
Pt is requesting referring or mammogram, was dx Paget's she is having some pain and itching in the breast. She was not able to go to the procedure due to having a Paget's dx, was told she needed a referral. She is also needing a refill on vyvanse

## 2019-05-22 ENCOUNTER — Other Ambulatory Visit: Payer: Self-pay | Admitting: Family Medicine

## 2019-05-22 DIAGNOSIS — N644 Mastodynia: Secondary | ICD-10-CM

## 2019-05-22 DIAGNOSIS — Z853 Personal history of malignant neoplasm of breast: Secondary | ICD-10-CM

## 2019-06-07 ENCOUNTER — Other Ambulatory Visit: Payer: BC Managed Care – PPO

## 2019-06-18 ENCOUNTER — Other Ambulatory Visit: Payer: Self-pay | Admitting: Family Medicine

## 2019-06-27 ENCOUNTER — Ambulatory Visit: Admission: RE | Admit: 2019-06-27 | Payer: BC Managed Care – PPO | Source: Ambulatory Visit

## 2019-06-27 ENCOUNTER — Other Ambulatory Visit: Payer: Self-pay | Admitting: Family Medicine

## 2019-06-27 ENCOUNTER — Ambulatory Visit
Admission: RE | Admit: 2019-06-27 | Discharge: 2019-06-27 | Disposition: A | Discharge status: Home / Self Care | Payer: BC Managed Care – PPO | Source: Ambulatory Visit | Attending: Family Medicine | Admitting: Family Medicine

## 2019-06-27 ENCOUNTER — Other Ambulatory Visit: Payer: Self-pay

## 2019-06-27 DIAGNOSIS — R921 Mammographic calcification found on diagnostic imaging of breast: Secondary | ICD-10-CM

## 2019-06-27 DIAGNOSIS — N644 Mastodynia: Secondary | ICD-10-CM

## 2019-06-27 DIAGNOSIS — Z853 Personal history of malignant neoplasm of breast: Secondary | ICD-10-CM

## 2019-07-02 ENCOUNTER — Ambulatory Visit
Admission: RE | Admit: 2019-07-02 | Discharge: 2019-07-02 | Disposition: A | Discharge status: Home / Self Care | Payer: BC Managed Care – PPO | Source: Ambulatory Visit | Attending: Family Medicine | Admitting: Family Medicine

## 2019-07-02 ENCOUNTER — Other Ambulatory Visit: Payer: Self-pay | Admitting: Family Medicine

## 2019-07-02 ENCOUNTER — Other Ambulatory Visit: Payer: Self-pay

## 2019-07-02 DIAGNOSIS — R921 Mammographic calcification found on diagnostic imaging of breast: Secondary | ICD-10-CM

## 2019-07-04 ENCOUNTER — Other Ambulatory Visit: Payer: Self-pay | Admitting: Family Medicine

## 2019-07-04 NOTE — Telephone Encounter (Signed)
Requested medications are due for refill today?  Yes  - Strength is Not Delegated  Requested medications are on active medication list?  Yes  Last Refill:  04/09/2019 # 12 with no refills.   Future visit scheduled?  Yes  Notes to Clinic:

## 2019-07-09 ENCOUNTER — Ambulatory Visit: Payer: Self-pay | Admitting: Surgery

## 2019-07-09 DIAGNOSIS — D0511 Intraductal carcinoma in situ of right breast: Secondary | ICD-10-CM

## 2019-07-09 NOTE — H&P (Signed)
Catherine Cole Documented: 07/09/2019 9:37 AM Location: [facility] Patient #: [id number] DOB: [birth year] Widowed / Language: Catherine Cole / Race: White Female  History of Present Illness Catherine Cole A. Elizabeth [name] MD; 07/09/2019 12:17 PM) Patient words: Patient sent at the request of Dr. Miquel Dunn of the breast in her [facility] due to abnormal mammogram. The patient was complaining of left breast pain or nipple pain. This dated back to 2019 until recently workup has been negative. She underwent recent bilateral mammogram showed a calcification cluster right breast upper outer quadrant pleomorphic. 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 Left breast was normal otherwise. She complains of left breast pain, left nipple pain, change in coloration of left nipple. No history of mass or drainage noted bilaterally.              Patient complains of diffuse left breast pain and itching. History of keratosis.  EXAM: DIGITAL DIAGNOSTIC BILATERAL MAMMOGRAM WITH CAD AND TOMO  COMPARISON: Previous exam(s).  ACR Breast Density Category c: The breast tissue is heterogeneously dense, which may obscure small masses.  FINDINGS: No suspicious mass or malignant type microcalcifications identified in the left breast.  In the upper-outer quadrant of the right breast there are linear branching calcifications spanning an area of 2.5 cm concerning for ductal carcinoma in situ. No suspicious mass is identified in the right breast.  Mammographic images were processed with CAD.  IMPRESSION: Suspicious right breast calcifications.  RECOMMENDATION: Stereotactic biopsy of calcifications in the upper-outer quadrant of the right breast is recommended.  I have discussed the findings and recommendations with the patient. If applicable, a reminder letter will be sent to the patient regarding the next appointment.  BI-RADS CATEGORY 4: Suspicious.   Electronically  Signed By: Lillia Mountain M.D. On: 06/27/2019 11:55       ADDITIONAL INFORMATION: PROGNOSTIC INDICATORS Results: IMMUNOHISTOCHEMICAL AND MORPHOMETRIC ANALYSIS PERFORMED MANUALLY Estrogen Receptor: 0%, NEGATIVE Progesterone Receptor: 0%, NEGATIVE COMMENT: The negative hormone receptor study(ies) in this case has an internal positive control. 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 SEE NOTE 1 of 2 FINAL for Catherine Cole ([id number]) Diagnosis Note Largest contiguous focus of DCIS measures 2.5 mm. Dr. [name] reviewed the case and concurs with the diagnosis. A breast prognostic profile (ER and PR) is pending and will be reported in an addendum. The [facility] was notified on 07/03/2019. Catherine Folds MD Pathologist, Electronic Signature (Case signed 07/03/2019) Specimen.  The patient is a 64 year old female.   Past Surgical History Catherine Cole, [place]; 07/09/2019 9:37 AM) Appendectomy Breast Biopsy Bilateral. Colon Polyp Removal - Colonoscopy Gallbladder Surgery - Laparoscopic Hysterectomy (not due to cancer) - Complete Tonsillectomy  Diagnostic Studies History Catherine Cole, [place]; 07/09/2019 9:37 AM) Colonoscopy 1-5 years ago Mammogram within last year  Allergies Catherine Cole, CMA; 07/09/2019 9:38 AM) Codeine Phosphate *ANALGESICS - OPIOID* Demerol *ANALGESICS - OPIOID* Neomycin Sulfate *AMINOGLYCOSIDES* Allergies Reconciled  Medication History Catherine Cole, CMA; 07/09/2019 9:39 AM) Vyvanse (50MG  Capsule, Oral) Active. Escitalopram Oxalate (20MG  Tablet, Oral)  Active. Vitamin D (Ergocalciferol) (1.25 MG(50000 UT) Capsule, Oral) Active. Multi Vitamin (Oral) Active. Triamterene-HCTZ (37.5-25MG  Tablet, Oral) Active. busPIRone HCl (10MG  Tablet, Oral) Active. Red Yeast Rice (600MG  Tablet, Oral) Active. Medications Reconciled  Social History Catherine Cole, [place]; 07/09/2019 9:37 AM) Alcohol use Remotely quit alcohol use. Caffeine use Coffee. No drug use Tobacco use Former smoker.  Family History Catherine Cole, [place]; 07/09/2019 9:37 AM) Alcohol Abuse Father. Arthritis Mother, Sister. Colon Polyps Mother, Sister. Depression Mother, Sister, Catherine Cole. Diabetes Mellitus Father. Heart Disease Father, Mother. Heart disease in female family member before age 37 Heart disease in female family member before age 80 Hypertension Mother, Sister, Son. Migraine Headache Sister. Respiratory Condition Sister. Thyroid problems Sister.  Pregnancy / Birth History Catherine Cole, [place]; 07/09/2019 9:37 AM) Age at menarche 28 years. Age of menopause <45 Gravida 1 Maternal age 77-20 Para 1  Other Problems Catherine Cole, [place]; 07/09/2019 9:37 AM) Anxiety Disorder Breast Cancer Cholelithiasis Depression High blood pressure Kidney Stone Lump In Breast     Review of Systems Catherine Cole CMA; 07/09/2019 9:37 AM) General Present- Fatigue. Not Present- Appetite Loss, Chills, Fever, Night Sweats, Weight Gain and Weight Loss. Skin Present- Dryness. Not Present- Change in Wart/Mole, Hives, Jaundice, New Lesions, Non-Healing Wounds, Rash and Ulcer. HEENT Present- Hearing Loss, Ringing in the Ears and Wears glasses/contact lenses. Not Present- Earache, Hoarseness, Nose Bleed, Oral Ulcers, Seasonal Allergies, Sinus Pain, Sore Throat, Visual Disturbances and Yellow Eyes. Breast Present- Breast Pain. Not Present- Breast Mass, Nipple Discharge and Skin Changes. Cardiovascular Not Present- Chest Pain, Difficulty Breathing Lying Down,  Leg Cramps, Palpitations, Rapid Heart Rate, Shortness of Breath and Swelling of Extremities. Gastrointestinal Not Present- Abdominal Pain, Bloating, Bloody Stool, Change in Bowel Habits, Chronic diarrhea, Constipation, Difficulty Swallowing, Excessive gas, Gets full quickly at meals, Hemorrhoids, Indigestion, Nausea, Rectal Pain and Vomiting. Female Genitourinary Present- Urgency. Not Present- Frequency, Nocturia, Painful Urination and Pelvic Pain. Musculoskeletal Not Present- Back Pain, Joint Pain, Joint Stiffness, Muscle Pain, Muscle Weakness and Swelling of Extremities. Neurological Present- Tremor. Not Present- Decreased Memory, Fainting, Headaches, Numbness, Seizures, Tingling, Trouble walking and Weakness. Psychiatric Present- Anxiety and Depression. Not Present- Bipolar, Change in Sleep Pattern, Fearful and Frequent crying. Endocrine Present- Heat Intolerance. Not Present- Cold Intolerance, Excessive Hunger, Hair Changes, Hot flashes and New Diabetes.  Vitals Catherine Cole CMA; 07/09/2019 9:38 AM) 07/09/2019 9:37 AM Weight: 198.2 lb Height: 66in Body Surface Area: 1.99 m Body Mass Index: 31.99 kg/m  Temp.: 98.105F  Pulse: 100 (Regular)  BP: 164/82 (Sitting, Left Arm, Standard)        Physical Exam (Crissa Sowder A. Tauni Sanks MD; 07/09/2019 12:40 PM)  General Mental Status-Alert. General Appearance-Consistent with stated age. Hydration-Well hydrated. Voice-Normal.  Head and Neck Trachea-midline.  Chest and Lung Exam Note: Workup breathing normal no wheezing  Breast Note: Right breast shows postbiopsy changes without large hematoma. Left breast is normal today. Nipples are normal bilaterally. No evidence of nipple discharge bilaterally.  Cardiovascular Note: Normal sinus rhythm.  No JVD  Neurologic Neurologic evaluation reveals -alert and oriented x 3 with no impairment of recent or remote memory. Mental  Status-Normal.  Neuropsychiatric Examination of related systems reveals-The patient is well-nourished and well-groomed. Mental status exam performed with findings of-Oriented X3 with appropriate mood and affect.  Lymphatic Head & Neck  General Head & Neck Lymphatics: Bilateral - Description - Normal. Axillary  General Axillary Region: Bilateral - Description - Normal. Tenderness - Non Tender.    Assessment & Plan (Josiah Wojtaszek A. Inri Sobieski MD; 07/09/2019 12:42 PM)  BREAST NEOPLASM, TIS (DCIS), RIGHT (D05.11) Impression: Discuss treatment options of breast conserving surgery lotions mastectomy. The patient desires a right simple mastectomy and left risk reducing mastectomy due to her family history  of breast cancer. She has no interest in reconstruction. We discussed sentinel lymph node mapping as well as the right only. We discussed breast conserving techniques as well and discussed the postsurgical techniques are equally effective in treating this disease. I reviewed the pros and cons of each technique as well as additional therapeutic radiation is required after breast conserving surgery and most cases. After lengthy discussion she has opted for right simple mastectomy with left risk reducing mastectomy. We discussed right axillary sentinel lymph node mapping and the complications of that as well. Total time spent with her was 55 minutes reviewing her medical records, reviewing her imaging, revealing referring notes, physical examination, discussion of all treatment options as well as surgery with risk and complications of long-term expectations and any additional therapy as well as documentation in the EMR   FAMILY HISTORY OF BREAST CANCER (Z80.3)  Current Plans You are being scheduled for surgery- Our schedulers will call you.  You should hear from our office's scheduling department within 5 working days about the location, date, and time of surgery. We try to make accommodations for  patient's preferences in scheduling surgery, but sometimes the OR schedule or the surgeon's schedule prevents [place] from making those accommodations.  If you have not heard from our office [phone]) in 5 working days, call the office and ask for your surgeon's nurse.  If you have other questions about your diagnosis, plan, or surgery, call the office and ask for your surgeon's nurse.  Pt Education - [facility] Mastectomy [facility]CI Pt Education - ABC (After Breast Cancer) Class Info: discussed with patient and provided information. Pt Education - [facility] Breast Pains Education

## 2019-07-09 NOTE — H&P (View-Only) (Signed)
Penni Homans Documented: 07/09/2019 9:37 AM Location: [facility] Patient #: [id number] DOB: 1955/08/30 Widowed / Language: [name] / Race: White Female  History of Present Illness [name] A. Dawon Troop MD; 07/09/2019 12:17 PM) Patient words: Patient sent at the request of Dr. Miquel Dunn of the breast in her [facility] due to abnormal mammogram. The patient was complaining of left breast pain or nipple pain. This dated back to 2019 until recently workup has been negative. She underwent recent bilateral mammogram showed a calcification cluster right breast upper outer quadrant pleomorphic. 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 Left breast was normal otherwise. She complains of left breast pain, left nipple pain, change in coloration of left nipple. No history of mass or drainage noted bilaterally.              Patient complains of diffuse left breast pain and itching. History of keratosis.  EXAM: DIGITAL DIAGNOSTIC BILATERAL MAMMOGRAM WITH CAD AND TOMO  COMPARISON: Previous exam(s).  ACR Breast Density Category c: The breast tissue is heterogeneously dense, which may obscure small masses.  FINDINGS: No suspicious mass or malignant type microcalcifications identified in the left breast.  In the upper-outer quadrant of the right breast there are linear branching calcifications spanning an area of 2.5 cm concerning for ductal carcinoma in situ. No suspicious mass is identified in the right breast.  Mammographic images were processed with CAD.  IMPRESSION: Suspicious right breast calcifications.  RECOMMENDATION: Stereotactic biopsy of calcifications in the upper-outer quadrant of the right breast is recommended.  I have discussed the findings and recommendations with the patient. If applicable, a reminder letter will be sent to the patient regarding the next appointment.  BI-RADS CATEGORY 4: Suspicious.   Electronically  Signed By: Lillia Mountain M.D. On: 06/27/2019 11:55       ADDITIONAL INFORMATION: PROGNOSTIC INDICATORS Results: IMMUNOHISTOCHEMICAL AND MORPHOMETRIC ANALYSIS PERFORMED MANUALLY Estrogen Receptor: 0%, NEGATIVE Progesterone Receptor: 0%, NEGATIVE COMMENT: The negative hormone receptor study(ies) in this case has an internal positive control. 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 SEE NOTE 1 of 2 FINAL for MARISSA, RASSEL [name] ([id number]) Diagnosis Note Largest contiguous focus of DCIS measures 2.5 mm. Dr. [name] reviewed the case and concurs with the diagnosis. A breast prognostic profile (ER and PR) is pending and will be reported in an addendum. The [facility] was notified on 07/03/2019. [name] MD Pathologist, Electronic Signature (Case signed 07/03/2019) Specimen.  The patient is a 64 year old female.   Past Surgical History [name], [place]; 07/09/2019 9:37 AM) Appendectomy Breast Biopsy Bilateral. Colon Polyp Removal - Colonoscopy Gallbladder Surgery - Laparoscopic Hysterectomy (not due to cancer) - Complete Tonsillectomy  Diagnostic Studies History [name], [place]; 07/09/2019 9:37 AM) Colonoscopy 1-5 years ago Mammogram within last year  Allergies [name], CMA; 07/09/2019 9:38 AM) Codeine Phosphate *ANALGESICS - OPIOID* Demerol *ANALGESICS - OPIOID* Neomycin Sulfate *AMINOGLYCOSIDES* Allergies Reconciled  Medication History [name], CMA; 07/09/2019 9:39 AM) Vyvanse (50MG  Capsule, Oral) Active. Escitalopram Oxalate (20MG  Tablet, Oral)  Active. Vitamin D (Ergocalciferol) (1.25 MG(50000 UT) Capsule, Oral) Active. Multi Vitamin (Oral) Active. Triamterene-HCTZ (37.5-25MG  Tablet, Oral) Active. busPIRone HCl (10MG  Tablet, Oral) Active. Red Yeast Rice (600MG  Tablet, Oral) Active. Medications Reconciled  Social History [name], [place]; 07/09/2019 9:37 AM) Alcohol use Remotely quit alcohol use. Caffeine use Coffee. No drug use Tobacco use Former smoker.  Family History [name], [place]; 07/09/2019 9:37 AM) Alcohol Abuse Father. Arthritis Mother, Sister. Colon Polyps Mother, Sister. Depression Mother, Sister, Pandora Leiter. Diabetes Mellitus Father. Heart Disease Father, Mother. Heart disease in female family member before age 105 Heart disease in female family member before age 29 Hypertension Mother, Sister, Son. Migraine Headache Sister. Respiratory Condition Sister. Thyroid problems Sister.  Pregnancy / Birth History [name], [place]; 07/09/2019 9:37 AM) Age at menarche 10 years. Age of menopause <45 Gravida 1 Maternal age 69-20 Para 1  Other Problems [name], [place]; 07/09/2019 9:37 AM) Anxiety Disorder Breast Cancer Cholelithiasis Depression High blood pressure Kidney Stone Lump In Breast     Review of Systems [name] CMA; 07/09/2019 9:37 AM) General Present- Fatigue. Not Present- Appetite Loss, Chills, Fever, Night Sweats, Weight Gain and Weight Loss. Skin Present- Dryness. Not Present- Change in Wart/Mole, Hives, Jaundice, New Lesions, Non-Healing Wounds, Rash and Ulcer. HEENT Present- Hearing Loss, Ringing in the Ears and Wears glasses/contact lenses. Not Present- Earache, Hoarseness, Nose Bleed, Oral Ulcers, Seasonal Allergies, Sinus Pain, Sore Throat, Visual Disturbances and Yellow Eyes. Breast Present- Breast Pain. Not Present- Breast Mass, Nipple Discharge and Skin Changes. Cardiovascular Not Present- Chest Pain, Difficulty Breathing Lying Down,  Leg Cramps, Palpitations, Rapid Heart Rate, Shortness of Breath and Swelling of Extremities. Gastrointestinal Not Present- Abdominal Pain, Bloating, Bloody Stool, Change in Bowel Habits, Chronic diarrhea, Constipation, Difficulty Swallowing, Excessive gas, Gets full quickly at meals, Hemorrhoids, Indigestion, Nausea, Rectal Pain and Vomiting. Female Genitourinary Present- Urgency. Not Present- Frequency, Nocturia, Painful Urination and Pelvic Pain. Musculoskeletal Not Present- Back Pain, Joint Pain, Joint Stiffness, Muscle Pain, Muscle Weakness and Swelling of Extremities. Neurological Present- Tremor. Not Present- Decreased Memory, Fainting, Headaches, Numbness, Seizures, Tingling, Trouble walking and Weakness. Psychiatric Present- Anxiety and Depression. Not Present- Bipolar, Change in Sleep Pattern, Fearful and Frequent crying. Endocrine Present- Heat Intolerance. Not Present- Cold Intolerance, Excessive Hunger, Hair Changes, Hot flashes and New Diabetes.  Vitals [name] CMA; 07/09/2019 9:38 AM) 07/09/2019 9:37 AM Weight: 198.2 lb Height: 66in Body Surface Area: 1.99 m Body Mass Index: 31.99 kg/m  Temp.: 98.38F  Pulse: 100 (Regular)  BP: 164/82 (Sitting, Left Arm, Standard)        Physical Exam (Hildred Pharo A. Cecilia Vancleve MD; 07/09/2019 12:40 PM)  General Mental Status-Alert. General Appearance-Consistent with stated age. Hydration-Well hydrated. Voice-Normal.  Head and Neck Trachea-midline.  Chest and Lung Exam Note: Workup breathing normal no wheezing  Breast Note: Right breast shows postbiopsy changes without large hematoma. Left breast is normal today. Nipples are normal bilaterally. No evidence of nipple discharge bilaterally.  Cardiovascular Note: Normal sinus rhythm.  No JVD  Neurologic Neurologic evaluation reveals -alert and oriented x 3 with no impairment of recent or remote memory. Mental  Status-Normal.  Neuropsychiatric Examination of related systems reveals-The patient is well-nourished and well-groomed. Mental status exam performed with findings of-Oriented X3 with appropriate mood and affect.  Lymphatic Head & Neck  General Head & Neck Lymphatics: Bilateral - Description - Normal. Axillary  General Axillary Region: Bilateral - Description - Normal. Tenderness - Non Tender.    Assessment & Plan (Tabitha Riggins A. Raymond Azure MD; 07/09/2019 12:42 PM)  BREAST NEOPLASM, TIS (DCIS), RIGHT (D05.11) Impression: Discuss treatment options of breast conserving surgery lotions mastectomy. The patient desires a right simple mastectomy and left risk reducing mastectomy due to her family history  of breast cancer. She has no interest in reconstruction. We discussed sentinel lymph node mapping as well as the right only. We discussed breast conserving techniques as well and discussed the postsurgical techniques are equally effective in treating this disease. I reviewed the pros and cons of each technique as well as additional therapeutic radiation is required after breast conserving surgery and most cases. After lengthy discussion she has opted for right simple mastectomy with left risk reducing mastectomy. We discussed right axillary sentinel lymph node mapping and the complications of that as well. Total time spent with her was 55 minutes reviewing her medical records, reviewing her imaging, revealing referring notes, physical examination, discussion of all treatment options as well as surgery with risk and complications of long-term expectations and any additional therapy as well as documentation in the EMR   FAMILY HISTORY OF BREAST CANCER (Z80.3)  Current Plans You are being scheduled for surgery- Our schedulers will call you.  You should hear from our office's scheduling department within 5 working days about the location, date, and time of surgery. We try to make accommodations for  patient's preferences in scheduling surgery, but sometimes the OR schedule or the surgeon's schedule prevents [place] from making those accommodations.  If you have not heard from our office [phone]) in 5 working days, call the office and ask for your surgeon's nurse.  If you have other questions about your diagnosis, plan, or surgery, call the office and ask for your surgeon's nurse.  Pt Education - [facility] Mastectomy HCI Pt Education - ABC (After Breast Cancer) Class Info: discussed with patient and provided information. Pt Education - [facility] Breast Pains Education

## 2019-07-10 ENCOUNTER — Telehealth: Payer: Self-pay | Admitting: Hematology and Oncology

## 2019-07-10 ENCOUNTER — Other Ambulatory Visit: Payer: Self-pay | Admitting: Genetic Counselor

## 2019-07-10 DIAGNOSIS — Z171 Estrogen receptor negative status [ER-]: Secondary | ICD-10-CM

## 2019-07-10 DIAGNOSIS — C50912 Malignant neoplasm of unspecified site of left female breast: Secondary | ICD-10-CM

## 2019-07-10 NOTE — Telephone Encounter (Signed)
Received referrals for medical oncology and genetics from Dr. Brantley Stage at [facility]. [name] has been cld and scheduled to see Dr. Lindi Adie on 3/24 at 8:15am and [name] for genetics at [facility]. Pt aware to arrive 15 minutes early.

## 2019-07-10 NOTE — Progress Notes (Signed)
[facility] NOTE  Patient Care Team: Rutherford Guys, MD as PCP - General (Family Medicine) Ena Dawley, MD as Consulting Physician (Obstetrics and Gynecology) Leonie Man, MD as Consulting Physician (Cardiology) Juanita Craver, MD as Consulting Physician (Gastroenterology)  CHIEF COMPLAINTS/PURPOSE OF CONSULTATION:  Newly diagnosed breast cancer  HISTORY OF PRESENTING ILLNESS:  Catherine Cole 64 y.o. female is here because of recent diagnosis of right breast DCIS. Patient noted diffuse left breast pain and itching. Diagnostic mammogram on 06/27/19 showed right breast calcifications. Biopsy on 07/02/19 showed high grade DCIS with calcifications, ER/PR negative. She presents to the clinic today for initial evaluation and discussion of treatment options.  Patient tells me that she has had itching sensation of the left breast but there was nothing abnormal in the left breast.  The abnormalities were all detected in the right breast.  She had extensive family history of breast and ovarian cancers.  She has genetic testing being planned for today.  I reviewed her records extensively and collaborated the history with the patient.  SUMMARY OF ONCOLOGIC HISTORY: Oncology History  Ductal carcinoma in situ (DCIS) of right breast  07/02/2019 Initial Biopsy   Diffuse left breast pain and itching. Diagnostic mammogram on 06/27/19 showed right breast calcifications. Biopsy on 07/02/19 showed high grade DCIS with calcifications, ER/PR negative   07/02/2019 Cancer Staging   Staging form: Breast, AJCC 8th Edition - Clinical stage from 07/02/2019: Stage 0 (cTis (DCIS), cN0, cM0, ER-, PR-) - Signed by Gardenia Phlegm, NP on 07/11/2019     MEDICAL HISTORY:  Past Medical History:  Diagnosis Date  . Adult ADHD   . Anemia   . Anxiety   . Depression   . Hypertension   . IBS (irritable bowel syndrome)     SURGICAL HISTORY: Past Surgical History:  Procedure  Laterality Date  . APPENDECTOMY     12 yrs  . CHOLECYSTECTOMY  1995  . CHOLESTEATOMA EXCISION     13 and 22 yrs  . FRACTURE SURGERY    . [place]  . 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 TONSILLECTOMY     15 yrs    SOCIAL HISTORY: Social History   Socioeconomic History  . Marital status: Widowed    Spouse name: Not on file  . Number of children: Not on file  . Years of education: Not on file  . Highest education level: Not on file  Occupational History  . Not on file  Tobacco Use  . Smoking status: Former Research scientist (life sciences)  . Smokeless tobacco: Never Used  Substance and Sexual Activity  . Alcohol use: No  . Drug use: No  . Sexual activity: Never  Other Topics Concern  . Not on file  Social History Narrative  . Not on file   Social Determinants of Health   Financial Resource Strain:   . Difficulty of Paying Living Expenses:   Food Insecurity:   . Worried About Charity fundraiser in the Last Year:   . Arboriculturist in the Last Year:   Transportation Needs:   . Film/video editor (Medical):   Marland Kitchen Lack of Transportation (Non-Medical):   Physical Activity:   . Days of Exercise per Week:   . Minutes of Exercise per Session:   Stress:   .  Feeling of Stress :   Social Connections:   . Frequency of Communication with Friends and Family:   . Frequency of Social Gatherings with Friends and Family:   . Attends Religious Services:   . Active Member of Clubs or Organizations:   . Attends Archivist Meetings:   Marland Kitchen Marital Status:   Intimate Partner Violence:   . Fear of Current or Ex-Partner:   . Emotionally Abused:   Marland Kitchen Physically Abused:   . Sexually Abused:     FAMILY HISTORY: Family History  Problem Relation Age of Onset  . Heart disease Mother 52       Heart  attack  . Alcohol abuse Father   . Diabetes Father     ALLERGIES:  is allergic to demerol; codeine; erythromycin; and neosporin [neomycin-polymyxin b gu].  MEDICATIONS:  Current Outpatient Medications  Medication Sig Dispense Refill  . busPIRone (BUSPAR) 10 MG tablet TAKE 1 TABLET BY MOUTH EVERYDAY AT BEDTIME 90 tablet 1  . escitalopram (LEXAPRO) 20 MG tablet TAKE 2 TABLETS BY MOUTH EVERY DAY 180 tablet 1  . Incontinence Supply Disposable (DEPEND EASY FIT UNDERGARMENTS) MISC Use daily as directed. Change several times daily as needed. 120 each 99  . lisdexamfetamine (VYVANSE) 50 MG capsule Take 1 capsule (50 mg total) by mouth daily before breakfast. 30 capsule 0  . lisdexamfetamine (VYVANSE) 50 MG capsule Take 1 capsule (50 mg total) by mouth daily. Ok to fill 30 days after signature 30 capsule 0  . lisdexamfetamine (VYVANSE) 50 MG capsule Take 1 capsule (50 mg total) by mouth daily. Ok to fill 60 days after signature 30 capsule 0  . Multiple Vitamin (MULTIVITAMIN WITH MINERALS) TABS tablet Take 1 tablet by mouth daily.    Marland Kitchen triamterene-hydrochlorothiazide (MAXZIDE-25) 37.5-25 MG tablet Take 1 tablet by mouth daily. 90 tablet 3  . valACYclovir (VALTREX) 500 MG tablet Take 2 tablets by mouth at acute onset for outbreak, followed by 1 tablet once a day x 5 days 30 tablet 3  . Vitamin D, Ergocalciferol, (DRISDOL) 1.25 MG (50000 UNIT) CAPS capsule TAKE 1 CAPSULE BY MOUTH EVERY 7 DAYS 12 capsule 0   No current facility-administered medications for this visit.    REVIEW OF SYSTEMS:   Constitutional: Denies fevers, chills or abnormal night sweats Eyes: Denies blurriness of vision, double vision or watery eyes Ears, nose, mouth, throat, and face: Denies mucositis or sore throat Respiratory: Denies cough, dyspnea or wheezes Cardiovascular: Denies palpitation, chest discomfort or lower extremity swelling Gastrointestinal:  Denies nausea, heartburn or change in bowel habits Skin: Denies abnormal  skin rashes Lymphatics: Denies new lymphadenopathy or easy bruising Neurological:Denies numbness, tingling or new weaknesses Behavioral/Psych: Mood is stable, no new changes  Breast: right breast itching and pain All other systems were reviewed with the patient and are negative.  PHYSICAL EXAMINATION: ECOG PERFORMANCE STATUS: 1 - Symptomatic but completely ambulatory  Vitals:   07/11/19 0830  BP: (!) 147/74  Pulse: 83  Resp: 18  Temp: (!) 97.4 F (36.3 C)  SpO2: 98%   Filed Weights   07/11/19 0830  Weight: 198 lb 12.8 oz (90.2 kg)    GENERAL:alert, no distress and comfortable SKIN: skin color, texture, turgor are normal, no rashes or significant lesions EYES: normal, conjunctiva are pink and non-injected, sclera clear OROPHARYNX:no exudate, no erythema and lips, buccal mucosa, and tongue normal  NECK: supple, thyroid normal size, non-tender, without nodularity LYMPH:  no palpable lymphadenopathy in the cervical, axillary or inguinal LUNGS: clear  to auscultation and percussion with normal breathing effort HEART: regular rate & rhythm and no murmurs and no lower extremity edema ABDOMEN:abdomen soft, non-tender and normal bowel sounds Musculoskeletal:no cyanosis of digits and no clubbing  PSYCH: alert & oriented x 3 with fluent speech NEURO: no focal motor/sensory deficits BREAST: No palpable nodules in breast. No palpable axillary or supraclavicular lymphadenopathy (exam performed in the presence of a chaperone)   LABORATORY DATA:  I have reviewed the data as listed Lab Results  Component Value Date   WBC 7.1 06/02/2017   HGB 13.2 06/02/2017   HCT 38.4 06/02/2017   MCV 84.9 06/02/2017   PLT 276 09/18/2015   Lab Results  Component Value Date   NA 140 03/19/2019   K 3.4 (L) 03/19/2019   CL 98 03/19/2019   CO2 24 03/19/2019    RADIOGRAPHIC STUDIES: I have personally reviewed the radiological reports and agreed with the findings in the report.  ASSESSMENT AND  PLAN:  Ductal carcinoma in situ (DCIS) of right breast 07/02/2019:diffuse left breast pain and itching. Diagnostic mammogram on 06/27/19 showed right breast calcifications. Biopsy on 07/02/19 showed high grade DCIS with calcifications, ER/PR negative  Pathology review: I discussed with the patient the difference between DCIS and invasive breast cancer. It is considered a precancerous lesion. DCIS is classified as a 0. It is generally detected through mammograms as calcifications. We discussed the significance of grades and its impact on prognosis. We also discussed the importance of ER and PR receptors and their implications to adjuvant treatment options. Prognosis of DCIS dependence on grade, comedo necrosis. It is anticipated that if not treated, 20-30% of DCIS can develop into invasive breast cancer.  Recommendation: Patient wants to do bilateral mastectomies probably without reconstruction. She is having discussions with Dr. Brantley Stage.  No role of adjuvant antiestrogen therapy because she is ER/PR negative.  Return to clinic after surgery to discuss the final pathology report.  If she does bilateral mastectomies then she will undergo sentinel lymph node study on the right side. If the final pathology agrees with the biopsy pathology then she will not need any further treatment from [place] and can be seen on an as-needed basis.   All questions were answered. The patient knows to call the clinic with any problems, questions or concerns.   Rulon Eisenmenger, MD, MPH 07/11/2019    I, Molly Dorshimer, am acting as scribe for Nicholas Lose, MD.  I have reviewed the above documentation for accuracy and completeness, and I agree with the above.

## 2019-07-11 ENCOUNTER — Encounter: Payer: Self-pay | Admitting: Adult Health

## 2019-07-11 ENCOUNTER — Inpatient Hospital Stay: Payer: BC Managed Care – PPO | Attending: Hematology and Oncology | Admitting: Hematology and Oncology

## 2019-07-11 ENCOUNTER — Encounter: Payer: Self-pay | Admitting: Genetic Counselor

## 2019-07-11 ENCOUNTER — Inpatient Hospital Stay: Payer: BC Managed Care – PPO

## 2019-07-11 ENCOUNTER — Other Ambulatory Visit: Payer: Self-pay

## 2019-07-11 ENCOUNTER — Inpatient Hospital Stay (HOSPITAL_BASED_OUTPATIENT_CLINIC_OR_DEPARTMENT_OTHER): Payer: BC Managed Care – PPO | Admitting: Genetic Counselor

## 2019-07-11 DIAGNOSIS — D0511 Intraductal carcinoma in situ of right breast: Secondary | ICD-10-CM | POA: Insufficient documentation

## 2019-07-11 DIAGNOSIS — F329 Major depressive disorder, single episode, unspecified: Secondary | ICD-10-CM | POA: Diagnosis not present

## 2019-07-11 DIAGNOSIS — Z808 Family history of malignant neoplasm of other organs or systems: Secondary | ICD-10-CM

## 2019-07-11 DIAGNOSIS — I10 Essential (primary) hypertension: Secondary | ICD-10-CM | POA: Diagnosis not present

## 2019-07-11 DIAGNOSIS — K589 Irritable bowel syndrome without diarrhea: Secondary | ICD-10-CM | POA: Diagnosis not present

## 2019-07-11 DIAGNOSIS — F419 Anxiety disorder, unspecified: Secondary | ICD-10-CM | POA: Diagnosis not present

## 2019-07-11 DIAGNOSIS — Z803 Family history of malignant neoplasm of breast: Secondary | ICD-10-CM

## 2019-07-11 DIAGNOSIS — N644 Mastodynia: Secondary | ICD-10-CM | POA: Diagnosis not present

## 2019-07-11 DIAGNOSIS — C50912 Malignant neoplasm of unspecified site of left female breast: Secondary | ICD-10-CM

## 2019-07-11 DIAGNOSIS — Z171 Estrogen receptor negative status [ER-]: Secondary | ICD-10-CM | POA: Diagnosis not present

## 2019-07-11 DIAGNOSIS — Z800 Family history of malignant neoplasm of digestive organs: Secondary | ICD-10-CM

## 2019-07-11 DIAGNOSIS — Z8041 Family history of malignant neoplasm of ovary: Secondary | ICD-10-CM

## 2019-07-11 DIAGNOSIS — Z79899 Other long term (current) drug therapy: Secondary | ICD-10-CM | POA: Insufficient documentation

## 2019-07-11 DIAGNOSIS — Z8051 Family history of malignant neoplasm of kidney: Secondary | ICD-10-CM

## 2019-07-11 LAB — GENETIC SCREENING ORDER

## 2019-07-11 NOTE — Progress Notes (Signed)
REFERRING PROVIDER: Rutherford Guys, MD [address] [place],   [address]  PRIMARY PROVIDER:  Rutherford Guys, MD  PRIMARY REASON FOR VISIT:  1. Ductal carcinoma in situ (DCIS) of right breast   2. Family history of ovarian cancer   3. Family history of breast cancer   4. Family history of colon cancer   5. Family history of kidney cancer   6. Family history of melanoma      HISTORY OF PRESENT ILLNESS:   Catherine Cole, a 64 y.o. female, was seen for a [facility] cancer genetics consultation at the request of Dr. Pamella Pert due to a personal and family history of cancer.  Catherine Cole presents to clinic today to discuss the possibility of a hereditary predisposition to cancer, genetic testing, and to further clarify her future cancer risks, as well as potential cancer risks for family members.   In March 2021, at the age of 53, Catherine Cole was diagnosed with DCIS of the right breast.  The tumor is ER/PR neg The treatment plan bilateral mastectomy.     CANCER HISTORY:  Oncology History  Ductal carcinoma in situ (DCIS) of right breast  07/02/2019 Initial Biopsy   Diffuse left breast pain and itching. Diagnostic mammogram on 06/27/19 showed right breast calcifications. Biopsy on 07/02/19 showed high grade DCIS with calcifications, ER/PR negative   07/02/2019 Cancer Staging   Staging form: Breast, AJCC 8th Edition - Clinical stage from 07/02/2019: Stage 0 (cTis (DCIS), cN0, cM0, ER-, PR-) - Signed by Gardenia Phlegm, NP on 07/11/2019      RISK FACTORS:  Menarche was at age 86.  First live birth at age 38.  Ovaries intact: yes.  Hysterectomy: yes.  Menopausal status: postmenopausal.  HRT use: 0 years. Colonoscopy: yes; reports having 10+ polyps. Mammogram within the last year: yes. Number of breast biopsies: 3. Up to date with pelvic exams: no. Any excessive radiation exposure in the past: no  Past Medical History:  Diagnosis Date  . Adult ADHD   . Anemia   .  Anxiety   . Depression   . Family history of breast cancer   . Family history of colon cancer   . Family history of kidney cancer   . Family history of melanoma   . Family history of ovarian cancer   . Hypertension   . IBS (irritable bowel syndrome)     Past Surgical History:  Procedure Laterality Date  . APPENDECTOMY     12 yrs  . CHOLECYSTECTOMY  1995  . CHOLESTEATOMA EXCISION     13 and 22 yrs  . FRACTURE SURGERY    . [place]  . LESION DESTRUCTION N/A 12/03/2015   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;  Surgeon: [name], DO;  Location: [facility];  Service: Plastics;  Laterality: N/A;  . TONSILLECTOMY     15 yrs    Social History   Socioeconomic History  . Marital status: Widowed    Spouse name: Not on file  . Number of children: Not on file  . Years of education: Not on file  . Highest education level: Not on file  Occupational History  . Not on file  Tobacco Use  . Smoking status: Former Research scientist (life sciences)  . Smokeless tobacco: Never Used  Substance and Sexual Activity  . Alcohol use: No  . Drug use: No  . Sexual activity: Never  Other Topics Concern  . Not on file  Social History Narrative  . Not on file   Social Determinants of Health   Financial Resource Strain:   . Difficulty of Paying Living Expenses:   Food Insecurity:   . Worried About Charity fundraiser in the Last Year:   . Arboriculturist in the Last Year:   Transportation Needs:   . Film/video editor (Medical):   Marland Kitchen Lack of Transportation (Non-Medical):   Physical Activity:   . Days of Exercise per Week:   . Minutes of Exercise per Session:   Stress:   . Feeling of Stress :   Social Connections:   . Frequency of Communication with Friends and Family:   . Frequency of Social Gatherings with Friends and Family:   . Attends Religious Services:   . Active Member of Clubs or Organizations:   . Attends Archivist  Meetings:   Marland Kitchen Marital Status:      FAMILY HISTORY:  We obtained a detailed, 4-generation family history.  Significant diagnoses are listed below: Family History  Problem Relation Age of Onset  . Heart disease Mother 29       Heart attack  . Colon polyps Mother   . Melanoma Mother   . Alcohol abuse Father   . Diabetes Father   . Colon polyps Sister   . Colon polyps Brother   . Colon polyps Son   . Brain cancer Maternal Aunt        pituitary cancer over 50  . Lung cancer Maternal Uncle   . Colon polyps Sister   . Melanoma Maternal Aunt   . Ovarian cancer Maternal Aunt   . Breast cancer Cousin        maternal first cousin  . Kidney cancer Cousin        mat first cousin  . Colon cancer Cousin        mat first cousin    The patient has one son who has had colon polyps but not cancer.  She has three full sisters, who of whom have colon polyps but no cancer. There is one maternal half brother who is cancer free.  Her mother is living her father is deceased.  The patient's father died from complications from alcohol abuse.  No information is known on his family.  The patient's mother has colon polyps and a history of melanoma.  She had three brothers and five sisters.  One brother had lung cancer and was a non-smoker.  A sister had a pituitary gland tumor and another sister had melanoma and ovarian cancer.  A third sister had a daughter with kidney cancer in her 52's, and a fourth sister had a daughter with breast cancer and son with colon cancer.  The maternal grandparents are deceased.  Catherine Cole is unaware of previous family history of genetic testing for hereditary cancer risks. Patient's maternal ancestors are of [place] descent, and paternal ancestors are of Caucasian descent. There is no reported Ashkenazi Jewish ancestry. There is no known consanguinity.  GENETIC COUNSELING ASSESSMENT: Catherine Cole is a 64 y.o. female with a personal and family history of cancer which  is somewhat suggestive of a hereditary cancer syndrome and predisposition to cancer given the combination of cancer in the family. We, therefore, discussed and recommended the following at today's visit.   DISCUSSION: We discussed that 5 - 10% of breast cancer is hereditary, with most cases associated with BRCA mutations.  There are other genes that can be associated with  hereditary breast cancer syndromes.  These include ATM, CHEK2 and PALB2.  The patient also meets criteria for hereditary melanoma syndromes, based on the combination of melanoma and kidney cancer.  This raises a concern for MITF mutations.  We discussed that testing is beneficial for several reasons including knowing how to follow individuals after completing their treatment, identifying whether potential treatment options such as PARP inhibitors would be beneficial, and understand if other family members could be at risk for cancer and allow them to undergo genetic testing.   We reviewed the characteristics, features and inheritance patterns of hereditary cancer syndromes. We also discussed genetic testing, including the appropriate family members to test, the process of testing, insurance coverage and turn-around-time for results. We discussed the implications of a negative, positive, carrier and/or variant of uncertain significant result. We recommended Catherine Cole pursue genetic testing for the CustomNext-Cancer+RNAinsight gene panel. The CustomNext-Cancer gene panel offered by [facility] and includes sequencing and rearrangement analysis for the following 91 genes: AIP, ALK, APC*, ATM*, AXIN2, BAP1, BARD1, BLM, BMPR1A, BRCA1*, BRCA2*, BRIP1*, CDC73, CDH1*, CDK4, CDKN1B, CDKN2A, CHEK2*, CTNNA1, DICER1, FANCC, FH, FLCN, GALNT12, KIF1B, LZTR1, MAX, MEN1, MET, MLH1*, MRE11A, MSH2*, MSH3, MSH6*, MUTYH*, NBN, NF1*, NF2, NTHL1, PALB2*, PHOX2B, PMS2*, POT1, PRKAR1A, PTCH1, PTEN*, RAD50, RAD51C*, RAD51D*, RB1, RECQL, RET, SDHA, SDHAF2, SDHB,  SDHC, SDHD, SMAD4, SMARCA4, SMARCB1, SMARCE1, STK11, SUFU, TMEM127, TP53*, TSC1, TSC2, VHL and XRCC2 (sequencing and deletion/duplication); CASR, CFTR, CPA1, CTRC, EGFR, EGLN1, FAM175A, HOXB13, KIT, MITF, MLH3, PALLD, PDGFRA, POLD1, POLE, PRSS1, RINT1, RPS20, SPINK1 and TERT (sequencing only); EPCAM and GREM1 (deletion/duplication only). DNA and RNA analyses performed for * genes.   Based on Catherine Cole's personal and family history of cancer, she meets medical criteria for genetic testing. Despite that she meets criteria, she may still have an out of pocket cost. We discussed that if her out of pocket cost for testing is over $100, the laboratory will call and confirm whether she wants to proceed with testing.  If the out of pocket cost of testing is less than $100 she will be billed by the genetic testing laboratory.   PLAN: After considering the risks, benefits, and limitations, Catherine Cole provided informed consent to pursue genetic testing and the blood sample was sent to [facility] for analysis of the CustomNext-Cancer+RNAinsight. Results should be available within approximately 2-3 weeks' time, at which point they will be disclosed by telephone to Catherine Cole, as will any additional recommendations warranted by these results. Catherine Cole will receive a summary of her genetic counseling visit and a copy of her results once available. This information will also be available in Epic.   Lastly, we encouraged Catherine Cole to remain in contact with cancer genetics annually so that we can continuously update the family history and inform her of any changes in cancer genetics and testing that may be of benefit for this family.   Catherine Cole's questions were answered to her satisfaction today. Our contact information was provided should additional questions or concerns arise. Thank you for the referral and allowing [place] to share in the care of your patient.   Raesha P. Florene Glen, [place],  [facility] Licensed, Insurance risk surveyor [email or web address] phone: [phone]  The patient was seen for a total of 45 minutes in face-to-face genetic counseling.  This patient was discussed with Drs. Magrinat, Lindi Adie and/or Burr Medico who agrees with the above.    _______________________________________________________________________ For Office Staff:  Number of people involved in session: 1 Was an Psychologist, prison and probation services  involved with case: no

## 2019-07-11 NOTE — Assessment & Plan Note (Signed)
07/02/2019:diffuse left breast pain and itching. Diagnostic mammogram on 06/27/19 showed right breast calcifications. Biopsy on 07/02/19 showed high grade DCIS with calcifications, ER/PR negative  Pathology review: I discussed with the patient the difference between DCIS and invasive breast cancer. It is considered a precancerous lesion. DCIS is classified as a 0. It is generally detected through mammograms as calcifications. We discussed the significance of grades and its impact on prognosis. We also discussed the importance of ER and PR receptors and their implications to adjuvant treatment options. Prognosis of DCIS dependence on grade, comedo necrosis. It is anticipated that if not treated, 20-30% of DCIS can develop into invasive breast cancer.  Recommendation: 1. Breast conserving surgery 2. Followed by adjuvant radiation therapy   No role of adjuvant antiestrogen therapy because she is ER/PR negative.  Return to clinic after surgery to discuss the final pathology report and come up with an adjuvant treatment plan.

## 2019-07-13 ENCOUNTER — Encounter: Payer: Self-pay | Admitting: *Deleted

## 2019-07-16 ENCOUNTER — Other Ambulatory Visit: Payer: Self-pay

## 2019-07-16 ENCOUNTER — Encounter (HOSPITAL_BASED_OUTPATIENT_CLINIC_OR_DEPARTMENT_OTHER): Payer: Self-pay | Admitting: Surgery

## 2019-07-17 ENCOUNTER — Encounter: Payer: Self-pay | Admitting: *Deleted

## 2019-07-17 ENCOUNTER — Telehealth: Payer: Self-pay | Admitting: Hematology and Oncology

## 2019-07-17 ENCOUNTER — Encounter (HOSPITAL_BASED_OUTPATIENT_CLINIC_OR_DEPARTMENT_OTHER)
Admission: RE | Admit: 2019-07-17 | Discharge: 2019-07-17 | Disposition: A | Discharge status: Home / Self Care | Payer: BC Managed Care – PPO | Source: Ambulatory Visit | Attending: Surgery | Admitting: Surgery

## 2019-07-17 DIAGNOSIS — Z01812 Encounter for preprocedural laboratory examination: Secondary | ICD-10-CM | POA: Insufficient documentation

## 2019-07-17 DIAGNOSIS — Z01810 Encounter for preprocedural cardiovascular examination: Secondary | ICD-10-CM | POA: Diagnosis present

## 2019-07-17 LAB — CBC WITH DIFFERENTIAL/PLATELET
Abs Immature Granulocytes: 0.05 10*3/uL (ref 0.00–0.07)
Basophils Absolute: 0.1 10*3/uL (ref 0.0–0.1)
Basophils Relative: 1 %
Eosinophils Absolute: 0.3 10*3/uL (ref 0.0–0.5)
Eosinophils Relative: 3 %
HCT: 40.1 % (ref 36.0–46.0)
Hemoglobin: 13.4 g/dL (ref 12.0–15.0)
Immature Granulocytes: 1 %
Lymphocytes Relative: 30 %
Lymphs Abs: 2.8 10*3/uL (ref 0.7–4.0)
MCH: 29.3 pg (ref 26.0–34.0)
MCHC: 33.4 g/dL (ref 30.0–36.0)
MCV: 87.6 fL (ref 80.0–100.0)
Monocytes Absolute: 0.7 10*3/uL (ref 0.1–1.0)
Monocytes Relative: 7 %
Neutro Abs: 5.5 10*3/uL (ref 1.7–7.7)
Neutrophils Relative %: 58 %
Platelets: 267 10*3/uL (ref 150–400)
RBC: 4.58 MIL/uL (ref 3.87–5.11)
RDW: 13 % (ref 11.5–15.5)
WBC: 9.3 10*3/uL (ref 4.0–10.5)
nRBC: 0 % (ref 0.0–0.2)

## 2019-07-17 LAB — COMPREHENSIVE METABOLIC PANEL
ALT: 24 U/L (ref 0–44)
AST: 21 U/L (ref 15–41)
Albumin: 4.2 g/dL (ref 3.5–5.0)
Alkaline Phosphatase: 66 U/L (ref 38–126)
Anion gap: 14 (ref 5–15)
BUN: 16 mg/dL (ref 8–23)
CO2: 25 mmol/L (ref 22–32)
Calcium: 9.6 mg/dL (ref 8.9–10.3)
Chloride: 99 mmol/L (ref 98–111)
Creatinine, Ser: 0.99 mg/dL (ref 0.44–1.00)
GFR calc Af Amer: >60 mL/min (ref >60)
GFR calc non Af Amer: >60 mL/min (ref >60)
Glucose, Bld: 106 mg/dL — ABNORMAL HIGH (ref 70–99)
Potassium: 3.7 mmol/L (ref 3.5–5.1)
Sodium: 138 mmol/L (ref 135–145)
Total Bilirubin: 0.9 mg/dL (ref 0.3–1.2)
Total Protein: 7.2 g/dL (ref 6.5–8.1)

## 2019-07-17 NOTE — Progress Notes (Signed)
      Enhanced Recovery after Surgery for Orthopedics Enhanced Recovery after Surgery is a protocol used to improve the stress on your body and your recovery after surgery.  Patient Instructions  . The night before surgery:  o No food after midnight. ONLY clear liquids after midnight  . The day of surgery (if you do NOT have diabetes):  o Drink ONE (1) Pre-Surgery Clear Ensure as directed.   o This drink was given to you during your hospital  pre-op appointment visit. o The pre-op nurse will instruct you on the time to drink the  Pre-Surgery Ensure depending on your surgery time. o Finish the drink at the designated time by the pre-op nurse.  o Nothing else to drink after completing the  Pre-Surgery Clear Ensure.  . The day of surgery (if you have diabetes): o Drink ONE (1) Gatorade 2 (G2) as directed. o This drink was given to you during your hospital  pre-op appointment visit.  o The pre-op nurse will instruct you on the time to drink the   Gatorade 2 (G2) depending on your surgery time. o Color of the Gatorade may vary. Red is not allowed. o Nothing else to drink after completing the  Gatorade 2 (G2).         If you have questions, please contact your surgeon's office.    Surgical soap given with instructions. Patient verbalized understanding.  

## 2019-07-17 NOTE — Telephone Encounter (Signed)
Scheduled appt per 3/29 sch message - pt is aware of appt date and time

## 2019-07-21 ENCOUNTER — Other Ambulatory Visit (HOSPITAL_COMMUNITY)
Admission: RE | Admit: 2019-07-21 | Discharge: 2019-07-21 | Disposition: A | Discharge status: Home / Self Care | Payer: BC Managed Care – PPO | Source: Ambulatory Visit | Attending: Surgery | Admitting: Surgery

## 2019-07-21 DIAGNOSIS — Z20822 Contact with and (suspected) exposure to covid-19: Secondary | ICD-10-CM | POA: Diagnosis not present

## 2019-07-21 DIAGNOSIS — Z01812 Encounter for preprocedural laboratory examination: Secondary | ICD-10-CM | POA: Insufficient documentation

## 2019-07-21 LAB — SARS CORONAVIRUS 2 (TAT 6-24 HRS): SARS Coronavirus 2: NEGATIVE

## 2019-07-25 ENCOUNTER — Ambulatory Visit (HOSPITAL_BASED_OUTPATIENT_CLINIC_OR_DEPARTMENT_OTHER): Payer: BC Managed Care – PPO | Admitting: Certified Registered Nurse Anesthetist

## 2019-07-25 ENCOUNTER — Ambulatory Visit (HOSPITAL_BASED_OUTPATIENT_CLINIC_OR_DEPARTMENT_OTHER)
Admission: RE | Admit: 2019-07-25 | Discharge: 2019-07-26 | Disposition: A | Discharge status: Home / Self Care | Payer: BC Managed Care – PPO | Attending: Surgery | Admitting: Surgery

## 2019-07-25 ENCOUNTER — Other Ambulatory Visit: Payer: Self-pay

## 2019-07-25 ENCOUNTER — Ambulatory Visit (HOSPITAL_COMMUNITY)
Admission: RE | Admit: 2019-07-25 | Discharge: 2019-07-25 | Disposition: A | Discharge status: Home / Self Care | Payer: BC Managed Care – PPO | Source: Ambulatory Visit | Attending: Surgery | Admitting: Surgery

## 2019-07-25 ENCOUNTER — Encounter (HOSPITAL_BASED_OUTPATIENT_CLINIC_OR_DEPARTMENT_OTHER)
Admission: RE | Disposition: A | Discharge status: Home / Self Care | Payer: Self-pay | Source: Home / Self Care | Attending: Surgery

## 2019-07-25 ENCOUNTER — Encounter (HOSPITAL_BASED_OUTPATIENT_CLINIC_OR_DEPARTMENT_OTHER): Payer: Self-pay | Admitting: Surgery

## 2019-07-25 DIAGNOSIS — Z881 Allergy status to other antibiotic agents status: Secondary | ICD-10-CM | POA: Diagnosis not present

## 2019-07-25 DIAGNOSIS — F419 Anxiety disorder, unspecified: Secondary | ICD-10-CM | POA: Insufficient documentation

## 2019-07-25 DIAGNOSIS — I10 Essential (primary) hypertension: Secondary | ICD-10-CM | POA: Diagnosis not present

## 2019-07-25 DIAGNOSIS — Z803 Family history of malignant neoplasm of breast: Secondary | ICD-10-CM | POA: Diagnosis not present

## 2019-07-25 DIAGNOSIS — D0511 Intraductal carcinoma in situ of right breast: Secondary | ICD-10-CM

## 2019-07-25 DIAGNOSIS — C50911 Malignant neoplasm of unspecified site of right female breast: Secondary | ICD-10-CM | POA: Diagnosis present

## 2019-07-25 DIAGNOSIS — L821 Other seborrheic keratosis: Secondary | ICD-10-CM | POA: Insufficient documentation

## 2019-07-25 DIAGNOSIS — Z79899 Other long term (current) drug therapy: Secondary | ICD-10-CM | POA: Diagnosis not present

## 2019-07-25 DIAGNOSIS — Z87891 Personal history of nicotine dependence: Secondary | ICD-10-CM | POA: Insufficient documentation

## 2019-07-25 DIAGNOSIS — Z885 Allergy status to narcotic agent status: Secondary | ICD-10-CM | POA: Diagnosis not present

## 2019-07-25 DIAGNOSIS — F329 Major depressive disorder, single episode, unspecified: Secondary | ICD-10-CM | POA: Insufficient documentation

## 2019-07-25 HISTORY — DX: Other specified postprocedural states: Z98.890

## 2019-07-25 HISTORY — PX: MASTECTOMY W/ SENTINEL NODE BIOPSY: SHX2001

## 2019-07-25 HISTORY — DX: Other complications of anesthesia, initial encounter: T88.59XA

## 2019-07-25 HISTORY — DX: Nausea with vomiting, unspecified: R11.2

## 2019-07-25 SURGERY — MASTECTOMY WITH SENTINEL LYMPH NODE BIOPSY
Anesthesia: General | Site: Breast | Laterality: Bilateral

## 2019-07-25 MED ORDER — KETOROLAC TROMETHAMINE 30 MG/ML IJ SOLN
30.0000 mg | Freq: Four times a day (QID) | INTRAMUSCULAR | Status: DC
  Administered 2019-07-25 – 2019-07-26 (×3): 30 mg via INTRAVENOUS
  Filled 2019-07-25 (×3): qty 1

## 2019-07-25 MED ORDER — GABAPENTIN 300 MG PO CAPS
300.0000 mg | ORAL_CAPSULE | ORAL | Status: AC
  Administered 2019-07-25: 300 mg via ORAL

## 2019-07-25 MED ORDER — CELECOXIB 200 MG PO CAPS
200.0000 mg | ORAL_CAPSULE | ORAL | Status: AC
  Administered 2019-07-25: 200 mg via ORAL

## 2019-07-25 MED ORDER — FENTANYL CITRATE (PF) 100 MCG/2ML IJ SOLN
INTRAMUSCULAR | Status: AC
  Filled 2019-07-25: qty 2

## 2019-07-25 MED ORDER — ACETAMINOPHEN 650 MG RE SUPP: 650.0000 mg | RECTAL | Status: DC | PRN

## 2019-07-25 MED ORDER — OXYCODONE HCL 5 MG PO TABS: 5.0000 mg | ORAL_TABLET | Freq: Four times a day (QID) | ORAL | 0 refills | Status: DC | PRN

## 2019-07-25 MED ORDER — PROPOFOL 10 MG/ML IV BOLUS
INTRAVENOUS | Status: DC | PRN
  Administered 2019-07-25: 150 mg via INTRAVENOUS

## 2019-07-25 MED ORDER — ACETAMINOPHEN 325 MG PO TABS: 650.0000 mg | ORAL_TABLET | ORAL | Status: DC | PRN

## 2019-07-25 MED ORDER — SODIUM CHLORIDE 0.9% FLUSH: 3.0000 mL | INTRAVENOUS | Status: DC | PRN

## 2019-07-25 MED ORDER — TRIAMTERENE-HCTZ 37.5-25 MG PO TABS: 1.0000 | ORAL_TABLET | Freq: Every day | ORAL | Status: DC

## 2019-07-25 MED ORDER — SODIUM CHLORIDE 0.9 % IV SOLN: 250.0000 mL | INTRAVENOUS | Status: DC | PRN

## 2019-07-25 MED ORDER — DEXAMETHASONE SODIUM PHOSPHATE 10 MG/ML IJ SOLN
INTRAMUSCULAR | Status: AC
  Filled 2019-07-25: qty 1

## 2019-07-25 MED ORDER — DEXTROSE 5 % IV SOLN
3.0000 g | INTRAVENOUS | Status: AC
  Administered 2019-07-25: 2 g via INTRAVENOUS

## 2019-07-25 MED ORDER — ONDANSETRON HCL 4 MG/2ML IJ SOLN
INTRAMUSCULAR | Status: AC
  Filled 2019-07-25: qty 2

## 2019-07-25 MED ORDER — PROPOFOL 10 MG/ML IV BOLUS
INTRAVENOUS | Status: AC
  Filled 2019-07-25: qty 20

## 2019-07-25 MED ORDER — BSS IO SOLN
INTRAOCULAR | Status: AC
  Filled 2019-07-25: qty 15

## 2019-07-25 MED ORDER — CELECOXIB 200 MG PO CAPS
ORAL_CAPSULE | ORAL | Status: AC
  Filled 2019-07-25: qty 1

## 2019-07-25 MED ORDER — BUPIVACAINE HCL (PF) 0.25 % IJ SOLN
INTRAMUSCULAR | Status: AC
  Filled 2019-07-25: qty 30

## 2019-07-25 MED ORDER — ROCURONIUM BROMIDE 10 MG/ML (PF) SYRINGE
PREFILLED_SYRINGE | INTRAVENOUS | Status: DC | PRN
  Administered 2019-07-25: 50 mg via INTRAVENOUS

## 2019-07-25 MED ORDER — PROPOFOL 500 MG/50ML IV EMUL
INTRAVENOUS | Status: AC
  Filled 2019-07-25: qty 50

## 2019-07-25 MED ORDER — FENTANYL CITRATE (PF) 100 MCG/2ML IJ SOLN
25.0000 ug | INTRAMUSCULAR | Status: DC | PRN
  Administered 2019-07-25: 25 ug via INTRAVENOUS

## 2019-07-25 MED ORDER — GABAPENTIN 300 MG PO CAPS
ORAL_CAPSULE | ORAL | Status: AC
  Filled 2019-07-25: qty 1

## 2019-07-25 MED ORDER — SCOPOLAMINE 1 MG/3DAYS TD PT72
MEDICATED_PATCH | TRANSDERMAL | Status: AC
  Filled 2019-07-25: qty 1

## 2019-07-25 MED ORDER — ROCURONIUM BROMIDE 10 MG/ML (PF) SYRINGE
PREFILLED_SYRINGE | INTRAVENOUS | Status: AC
  Filled 2019-07-25: qty 10

## 2019-07-25 MED ORDER — LACTATED RINGERS IV SOLN: INTRAVENOUS | Status: DC | PRN

## 2019-07-25 MED ORDER — OXYCODONE HCL 5 MG PO TABS
5.0000 mg | ORAL_TABLET | ORAL | Status: DC | PRN
  Administered 2019-07-25: 5 mg via ORAL
  Filled 2019-07-25: qty 1

## 2019-07-25 MED ORDER — IBUPROFEN 800 MG PO TABS: 800.0000 mg | ORAL_TABLET | Freq: Three times a day (TID) | ORAL | 0 refills | Status: DC | PRN

## 2019-07-25 MED ORDER — ACETAMINOPHEN 500 MG PO TABS
1000.0000 mg | ORAL_TABLET | Freq: Four times a day (QID) | ORAL | Status: DC
  Administered 2019-07-25 – 2019-07-26 (×3): 1000 mg via ORAL
  Filled 2019-07-25 (×3): qty 2

## 2019-07-25 MED ORDER — SUGAMMADEX SODIUM 200 MG/2ML IV SOLN
INTRAVENOUS | Status: DC | PRN
  Administered 2019-07-25: 200 mg via INTRAVENOUS

## 2019-07-25 MED ORDER — DIPHENHYDRAMINE HCL 50 MG/ML IJ SOLN
INTRAMUSCULAR | Status: DC | PRN
  Administered 2019-07-25: 12.5 mg via INTRAVENOUS

## 2019-07-25 MED ORDER — BUPIVACAINE-EPINEPHRINE (PF) 0.25% -1:200000 IJ SOLN
INTRAMUSCULAR | Status: DC | PRN
  Administered 2019-07-25 (×2): 30 mL via PERINEURAL

## 2019-07-25 MED ORDER — FENTANYL CITRATE (PF) 100 MCG/2ML IJ SOLN
50.0000 ug | INTRAMUSCULAR | Status: DC | PRN
  Administered 2019-07-25 (×2): 50 ug via INTRAVENOUS

## 2019-07-25 MED ORDER — LACTATED RINGERS IV SOLN: INTRAVENOUS | Status: DC

## 2019-07-25 MED ORDER — CLONIDINE HCL (ANALGESIA) 100 MCG/ML EP SOLN
EPIDURAL | Status: DC | PRN
  Administered 2019-07-25 (×2): 50 ug

## 2019-07-25 MED ORDER — CHLORHEXIDINE GLUCONATE CLOTH 2 % EX PADS: 6.0000 | MEDICATED_PAD | Freq: Once | CUTANEOUS | Status: DC

## 2019-07-25 MED ORDER — DIPHENHYDRAMINE HCL 50 MG/ML IJ SOLN
INTRAMUSCULAR | Status: AC
  Filled 2019-07-25: qty 1

## 2019-07-25 MED ORDER — DEXTROSE-NACL 5-0.9 % IV SOLN: INTRAVENOUS | Status: DC

## 2019-07-25 MED ORDER — CEFAZOLIN SODIUM-DEXTROSE 2-4 GM/100ML-% IV SOLN
INTRAVENOUS | Status: AC
  Filled 2019-07-25: qty 100

## 2019-07-25 MED ORDER — SCOPOLAMINE 1 MG/3DAYS TD PT72
MEDICATED_PATCH | TRANSDERMAL | Status: DC | PRN
  Administered 2019-07-25: 1 via TRANSDERMAL

## 2019-07-25 MED ORDER — MORPHINE SULFATE (PF) 4 MG/ML IV SOLN: 1.0000 mg | INTRAVENOUS | Status: DC | PRN

## 2019-07-25 MED ORDER — PROPOFOL 500 MG/50ML IV EMUL
INTRAVENOUS | Status: DC | PRN
  Administered 2019-07-25: 100 ug/kg/min via INTRAVENOUS

## 2019-07-25 MED ORDER — LIDOCAINE 2% (20 MG/ML) 5 ML SYRINGE
INTRAMUSCULAR | Status: AC
  Filled 2019-07-25: qty 5

## 2019-07-25 MED ORDER — ONDANSETRON HCL 4 MG/2ML IJ SOLN
INTRAMUSCULAR | Status: DC | PRN
  Administered 2019-07-25: 4 mg via INTRAVENOUS

## 2019-07-25 MED ORDER — ACETAMINOPHEN 500 MG PO TABS
ORAL_TABLET | ORAL | Status: AC
  Filled 2019-07-25: qty 2

## 2019-07-25 MED ORDER — FENTANYL CITRATE (PF) 250 MCG/5ML IJ SOLN
INTRAMUSCULAR | Status: DC | PRN
  Administered 2019-07-25 (×2): 50 ug via INTRAVENOUS

## 2019-07-25 MED ORDER — SODIUM CHLORIDE 0.9% FLUSH
3.0000 mL | Freq: Two times a day (BID) | INTRAVENOUS | Status: DC
  Administered 2019-07-25: 3 mL via INTRAVENOUS

## 2019-07-25 MED ORDER — ACETAMINOPHEN 500 MG PO TABS
1000.0000 mg | ORAL_TABLET | ORAL | Status: AC
  Administered 2019-07-25: 1000 mg via ORAL

## 2019-07-25 MED ORDER — LIDOCAINE 2% (20 MG/ML) 5 ML SYRINGE
INTRAMUSCULAR | Status: DC | PRN
  Administered 2019-07-25: 80 mg via INTRAVENOUS

## 2019-07-25 MED ORDER — PROMETHAZINE HCL 25 MG/ML IJ SOLN: 6.2500 mg | INTRAMUSCULAR | Status: DC | PRN

## 2019-07-25 MED ORDER — TECHNETIUM TC 99M SULFUR COLLOID FILTERED
1.0000 | Freq: Once | INTRAVENOUS | Status: AC | PRN
  Administered 2019-07-25: 1 via INTRADERMAL

## 2019-07-25 MED ORDER — ONDANSETRON HCL 4 MG/2ML IJ SOLN: 4.0000 mg | INTRAMUSCULAR | Status: DC | PRN

## 2019-07-25 MED ORDER — DEXAMETHASONE SODIUM PHOSPHATE 10 MG/ML IJ SOLN
INTRAMUSCULAR | Status: DC | PRN
  Administered 2019-07-25: 4 mg via INTRAVENOUS

## 2019-07-25 MED ORDER — MIDAZOLAM HCL 2 MG/2ML IJ SOLN
1.0000 mg | INTRAMUSCULAR | Status: DC | PRN
  Administered 2019-07-25: 2 mg via INTRAVENOUS

## 2019-07-25 MED ORDER — MIDAZOLAM HCL 2 MG/2ML IJ SOLN
INTRAMUSCULAR | Status: AC
  Filled 2019-07-25: qty 2

## 2019-07-25 SURGICAL SUPPLY — 69 items
APPLIER CLIP 11 MED OPEN (CLIP)
APPLIER CLIP 9.375 MED OPEN (MISCELLANEOUS) ×3
BENZOIN TINCTURE PRP APPL 2/3 (GAUZE/BANDAGES/DRESSINGS) IMPLANT
BINDER BREAST LRG (GAUZE/BANDAGES/DRESSINGS) IMPLANT
BINDER BREAST MEDIUM (GAUZE/BANDAGES/DRESSINGS) IMPLANT
BINDER BREAST XLRG (GAUZE/BANDAGES/DRESSINGS) ×2 IMPLANT
BINDER BREAST XXLRG (GAUZE/BANDAGES/DRESSINGS) IMPLANT
BIOPATCH RED 1 DISK 7.0 (GAUZE/BANDAGES/DRESSINGS) ×4 IMPLANT
BIOPATCH RED 1IN DISK 7.0MM (GAUZE/BANDAGES/DRESSINGS) ×4
BLADE HEX COATED 2.75 (ELECTRODE) ×3 IMPLANT
BLADE SURG 10 STRL SS (BLADE) ×3 IMPLANT
BLADE SURG 15 STRL LF DISP TIS (BLADE) ×1 IMPLANT
BLADE SURG 15 STRL SS (BLADE) ×3
CANISTER SUCT 1200ML W/VALVE (MISCELLANEOUS) ×3 IMPLANT
CHLORAPREP W/TINT 26 (MISCELLANEOUS) ×5 IMPLANT
CLIP APPLIE 11 MED OPEN (CLIP) IMPLANT
CLIP APPLIE 9.375 MED OPEN (MISCELLANEOUS) ×1 IMPLANT
CLOSURE WOUND 1/2 X4 (GAUZE/BANDAGES/DRESSINGS)
COVER BACK TABLE 60X90IN (DRAPES) ×3 IMPLANT
COVER MAYO STAND STRL (DRAPES) ×3 IMPLANT
COVER PROBE W GEL 5X96 (DRAPES) ×3 IMPLANT
COVER WAND RF STERILE (DRAPES) IMPLANT
DECANTER SPIKE VIAL GLASS SM (MISCELLANEOUS) IMPLANT
DERMABOND ADVANCED (GAUZE/BANDAGES/DRESSINGS) ×4
DERMABOND ADVANCED .7 DNX12 (GAUZE/BANDAGES/DRESSINGS) ×2 IMPLANT
DRAIN CHANNEL 19F RND (DRAIN) ×9 IMPLANT
DRAPE LAPAROSCOPIC ABDOMINAL (DRAPES) ×3 IMPLANT
DRAPE UTILITY XL STRL (DRAPES) ×2 IMPLANT
DRSG PAD ABDOMINAL 8X10 ST (GAUZE/BANDAGES/DRESSINGS) ×4 IMPLANT
DRSG TEGADERM 4X10 (GAUZE/BANDAGES/DRESSINGS) ×1 IMPLANT
ELECT REM PT RETURN 9FT ADLT (ELECTROSURGICAL) ×3
ELECTRODE REM PT RTRN 9FT ADLT (ELECTROSURGICAL) ×1 IMPLANT
EVACUATOR SILICONE 100CC (DRAIN) ×9 IMPLANT
GAUZE SPONGE 4X4 12PLY STRL LF (GAUZE/BANDAGES/DRESSINGS) IMPLANT
GLOVE BIOGEL PI IND STRL 8 (GLOVE) ×1 IMPLANT
GLOVE BIOGEL PI INDICATOR 8 (GLOVE) ×2
GLOVE ECLIPSE 8.0 STRL XLNG CF (GLOVE) ×1 IMPLANT
GLOVE SURG SS PI 6.5 STRL IVOR (GLOVE) ×6 IMPLANT
GLOVE SURG SS PI 8.0 STRL IVOR (GLOVE) ×2 IMPLANT
GOWN STRL REUS W/ TWL LRG LVL3 (GOWN DISPOSABLE) ×2 IMPLANT
GOWN STRL REUS W/TWL LRG LVL3 (GOWN DISPOSABLE) ×6
LIGHT WAVEGUIDE WIDE FLAT (MISCELLANEOUS) IMPLANT
NDL HYPO 25X1 1.5 SAFETY (NEEDLE) ×2 IMPLANT
NDL SAFETY ECLIPSE 18X1.5 (NEEDLE) ×1 IMPLANT
NEEDLE HYPO 18GX1.5 SHARP (NEEDLE)
NEEDLE HYPO 25X1 1.5 SAFETY (NEEDLE) IMPLANT
NS IRRIG 1000ML POUR BTL (IV SOLUTION) ×2 IMPLANT
PACK BASIN DAY SURGERY FS (CUSTOM PROCEDURE TRAY) ×3 IMPLANT
PENCIL SMOKE EVACUATOR (MISCELLANEOUS) ×3 IMPLANT
PIN SAFETY STERILE (MISCELLANEOUS) ×5 IMPLANT
SLEEVE SCD COMPRESS KNEE MED (MISCELLANEOUS) ×3 IMPLANT
SPONGE LAP 18X18 RF (DISPOSABLE) ×5 IMPLANT
SPONGE LAP 4X18 RFD (DISPOSABLE) IMPLANT
STAPLER VISISTAT 35W (STAPLE) ×1 IMPLANT
STRIP CLOSURE SKIN 1/2X4 (GAUZE/BANDAGES/DRESSINGS) IMPLANT
SUT CHROMIC 3 0 SH 27 (SUTURE) IMPLANT
SUT ETHILON 2 0 FS 18 (SUTURE) ×9 IMPLANT
SUT MNCRL AB 3-0 PS2 18 (SUTURE) ×9 IMPLANT
SUT MON AB 4-0 PC3 18 (SUTURE) IMPLANT
SUT SILK 2 0 PERMA HAND 18 BK (SUTURE) ×3 IMPLANT
SUT SILK 2 0 SH (SUTURE) IMPLANT
SUT VIC AB 3-0 54X BRD REEL (SUTURE) ×1 IMPLANT
SUT VIC AB 3-0 BRD 54 (SUTURE) ×3
SUT VICRYL 3-0 CR8 SH (SUTURE) ×6 IMPLANT
SYR CONTROL 10ML LL (SYRINGE) ×2 IMPLANT
TOWEL GREEN STERILE FF (TOWEL DISPOSABLE) ×6 IMPLANT
TUBE CONNECTING 20'X1/4 (TUBING) ×1
TUBE CONNECTING 20X1/4 (TUBING) ×2 IMPLANT
YANKAUER SUCT BULB TIP NO VENT (SUCTIONS) ×3 IMPLANT

## 2019-07-25 NOTE — Anesthesia Preprocedure Evaluation (Signed)
Anesthesia Evaluation  Patient identified by MRN, date of birth, ID band Patient awake    Reviewed: Allergy & Precautions, NPO status , Patient's Chart, lab work & pertinent test results  History of Anesthesia Complications (+) PONV and history of anesthetic complications  Airway Mallampati: II  TM Distance: >3 FB Neck ROM: Full    Dental  (+) Teeth Intact, Dental Advisory Given   Pulmonary former smoker,    Pulmonary exam normal breath sounds clear to auscultation       Cardiovascular hypertension, Pt. on medications Normal cardiovascular exam Rhythm:Regular Rate:Normal     Neuro/Psych PSYCHIATRIC DISORDERS Anxiety Depression negative neurological ROS     GI/Hepatic negative GI ROS, Neg liver ROS,   Endo/Other  negative endocrine ROS  Renal/GU negative Renal ROS     Musculoskeletal negative musculoskeletal ROS (+)   Abdominal   Peds  Hematology negative hematology ROS (+)   Anesthesia Other Findings Day of surgery medications reviewed with the patient.   RIGHT BREAST CANCER, FAMILY HISTORY OF BREAST CANCER  Reproductive/Obstetrics                             Anesthesia Physical Anesthesia Plan  ASA: II  Anesthesia Plan: General   Post-op Pain Management:  Regional for Post-op pain   Induction: Intravenous  PONV Risk Score and Plan: 4 or greater and Diphenhydramine, Scopolamine patch - Pre-op, Midazolam, Propofol infusion, Dexamethasone and Ondansetron  Airway Management Planned: Oral ETT  Additional Equipment:   Intra-op Plan:   Post-operative Plan: Extubation in OR  Informed Consent: I have reviewed the patients History and Physical, chart, labs and discussed the procedure including the risks, benefits and alternatives for the proposed anesthesia with the patient or authorized representative who has indicated his/her understanding and acceptance.     Dental advisory  given  Plan Discussed with: CRNA  Anesthesia Plan Comments:         Anesthesia Quick Evaluation

## 2019-07-25 NOTE — Anesthesia Procedure Notes (Signed)
Anesthesia Regional Block: Pectoralis block   Pre-Anesthetic Checklist: ,, timeout performed, Correct Patient, Correct Site, Correct Laterality, Correct Procedure, Correct Position, site marked, Risks and benefits discussed,  Surgical consent,  Pre-op evaluation,  At surgeon's request and post-op pain management  Laterality: Right  Prep: chloraprep       Needles:  Injection technique: Single-shot  Needle Type: Echogenic Needle     Needle Length: 9cm  Needle Gauge: 21     Additional Needles:   Procedures:,,,, ultrasound used (permanent image in chart),,,,  Narrative:  Start time: 07/25/2019 12:33 PM End time: 07/25/2019 12:41 PM Injection made incrementally with aspirations every 5 mL.  Performed by: Personally  Anesthesiologist: Catalina Gravel, MD  Additional Notes: No pain on injection. No increased resistance to injection. Injection made in 5cc increments.  Good needle visualization.  Patient tolerated procedure well.

## 2019-07-25 NOTE — Anesthesia Procedure Notes (Signed)
Procedure Name: Intubation Date/Time: 07/25/2019 1:16 PM Performed by: Alain Marion, CRNA Pre-anesthesia Checklist: Patient identified, Emergency Drugs available, Suction available and Patient being monitored Patient Re-evaluated:Patient Re-evaluated prior to induction Oxygen Delivery Method: Circle System Utilized Preoxygenation: Pre-oxygenation with 100% oxygen Induction Type: IV induction Ventilation: Mask ventilation without difficulty Laryngoscope Size: Miller and 2 Grade View: Grade I Tube type: Oral Tube size: 7.0 mm Number of attempts: 1 Airway Equipment and Method: Stylet Placement Confirmation: ETT inserted through vocal cords under direct vision,  positive ETCO2 and breath sounds checked- equal and bilateral Secured at: 21 cm Tube secured with: Tape Dental Injury: Teeth and Oropharynx as per pre-operative assessment

## 2019-07-25 NOTE — Op Note (Signed)
Preoperative diagnosis: Right breast DCIS with family history of breast cancer  Postoperative diagnosis: Same  Procedure: Right simple mastectomy with right axillary sentinel lymph node mapping and left risk reducing simple mastectomy  Surgeon: [name], MD Assistant: Izola Price, RNFA Anesthesia: General  EBL: 60 cc  Drains: 419 round Jackson-Pratt to the each mastectomy site  Specimen: Bilateral breast tissue to pathology.  1 right axillary sentinel node hot  IV fluids: Per anesthesia record  Indications for procedure: The patient is a 64 year old female diagnosed with right breast DCIS after work-up which included mammogram and core biopsy.  We discussed options of management of the disease as well as the natural pathophysiology of the disease.  We discussed nonoperative management techniques, clinical trials, lumpectomy with radiation therapy and subsequent mastectomy.  Given her family history she had a strong desire for bilateral mastectomy.  She had no interest in reconstruction.  I had a lengthy discussion about the pros and cons of this approach and discussed how the different surgical strategies and treatments were equivalent.  I discussed her situation with respect to  the advantage of  mastectomy at risk reduction.  I also explained the increased complication risk with this approach and that shortly all of her options were available with outcomes being very similar even with less surgery.  Despite lengthy conversation, she had no interest in breast conserving surgery and opted for bilateral mastectomy without reconstruction.The surgical and non surgical options have been discussed with the patient.  Risks of surgery include bleeding,  Infection,  Flap necrosis,  Tissue loss,  Chronic pain, death, Numbness,  And the need for additional procedures.  Reconstruction options also have been discussed with the patient as well.  The patient agrees to proceed.Sentinel lymph node mapping  and dissection has been discussed with the patient.  Risk of bleeding,  Infection,  Seroma formation,  Additional procedures,,  Shoulder weakness , lymphedema shoulder stiffness,  Nerve and blood vessel injury and reaction to the mapping dyes have been discussed.  Alternatives to surgery have been discussed with the patient.  The patient agrees to proceed.   Description of procedure: The patient was met in the holding area and questions were answered.  We discussed the procedure at great length as well as postoperative care, long-term expectations, complications, and wound care and drain care that would be necessary afterwards.  All of her questions were answered to the best my ability.  She underwent block per anesthesia.  She underwent injection of technetium sulfur colloid right breast for the mapping for the procedure by radiology.  She was then taken back to the operating.  She is placed supine upon the OR table.  After induction of general esthesia both breasts were prepped and draped in sterile fashion timeout performed.  The procedure was discussed as well as operative site.  The left side was done first.  Curvilinear incision was made above and below the nipple areolar complex.  She had significant redundant skin therefore the pattern was quite wide.  A superior skin flap was taken with the cautery to the clavicle.  The inferior skin flap was taken down to the inframammary fold.  We then dissected the breast off the chest wall to include the pectoralis major fascia in a medial to lateral fashion until the lateral attachments were encountered and divided.  Cautery was used for hemostasis and this was excellent.  This was then irrigated.  There is no evidence of any bleeding.  Through 2 separate stab  incision 19 round Blake drains were placed and secured with 2-0 nylon.  Wound closed with 3-0 Vicryl and 4-0 Monocryl.  In a similar skin pattern in the right mastectomy was done.  Superior and inferior skin  flaps were made.  Superior skin flap was taken the clavicle, inferior skin flap taken to the inframammary fold.  The breast was then dissected medial lateral fashion off the pectoralis fascia and into the lateral attachments were encountered and divided.  There was 1 hot sentinel node identified by neoprobe.  Background counts approached 0.  This note was sent separate.  Hemostasis achieved.  219 round drains were placed through the inferior flap through separate stab incisions.  2-0 nylon placed to secure the drain to the skin.  Wound closed with 3-0 Vicryl and 4-0 Monocryl.  Dermabond applied.  All counts were found to be correct of sponge, needles and instruments.  Dermabond placed.  Breast binder placed.  The patient was then awoke extubated taken recovery in satisfactory condition.

## 2019-07-25 NOTE — Interval H&P Note (Signed)
History and Physical Interval Note:  07/25/2019 12:48 PM  Catherine Cole  has presented today for surgery, with the diagnosis of RIGHT BREAST CANCER, FAMILY HISTORY OF BREAST CANCER.  The various methods of treatment have been discussed with the patient and family. After consideration of risks, benefits and other options for treatment, the patient has consented to  Procedure(s) with comments: BILATERAL MASTECTOMY WITH RIGHT SENTINEL LYMPH NODE BIOPSY, LEFT RISK REDUCING MASTECTOMY (Bilateral) - PEC BLOCK as a surgical intervention.  The patient's history has been reviewed, patient examined, no change in status, stable for surgery.  I have reviewed the patient's chart and labs.  Questions were answered to the patient's satisfaction.     [place]

## 2019-07-25 NOTE — Anesthesia Postprocedure Evaluation (Signed)
Anesthesia Post Note  Patient: Catherine Cole  Procedure(s) Performed: BILATERAL MASTECTOMY WITH RIGHT SENTINEL LYMPH NODE BIOPSY, LEFT RISK REDUCING MASTECTOMY (Bilateral Breast)     Patient location during evaluation: PACU Anesthesia Type: General Level of consciousness: awake and alert Pain management: pain level controlled Vital Signs Assessment: post-procedure vital signs reviewed and stable Respiratory status: spontaneous breathing, nonlabored ventilation and respiratory function stable Cardiovascular status: blood pressure returned to baseline and stable Postop Assessment: no apparent nausea or vomiting Anesthetic complications: no    Last Vitals:  Vitals:   07/25/19 2000 07/25/19 2015  BP:  118/65  Pulse: 75 75  Resp: 18 18  Temp:  (!) 36.3 C  SpO2: 93% 93%    Last Pain:  Vitals:   07/25/19 1851  TempSrc:   PainSc: 4                  Catalina Gravel

## 2019-07-25 NOTE — Anesthesia Procedure Notes (Signed)
Anesthesia Regional Block: Pectoralis block   Pre-Anesthetic Checklist: ,, timeout performed, Correct Patient, Correct Site, Correct Laterality, Correct Procedure, Correct Position, site marked, Risks and benefits discussed,  Surgical consent,  Pre-op evaluation,  At surgeon's request and post-op pain management  Laterality: Left  Prep: chloraprep       Needles:  Injection technique: Single-shot  Needle Type: Echogenic Needle     Needle Length: 9cm  Needle Gauge: 21     Additional Needles:   Procedures:,,,, ultrasound used (permanent image in chart),,,,  Narrative:  Start time: 07/25/2019 12:41 PM End time: 07/25/2019 12:48 PM Injection made incrementally with aspirations every 5 mL.  Performed by: Personally  Anesthesiologist: Catalina Gravel, MD  Additional Notes: No pain on injection. No increased resistance to injection. Injection made in 5cc increments.  Good needle visualization.  Patient tolerated procedure well.

## 2019-07-25 NOTE — Transfer of Care (Signed)
Immediate Anesthesia Transfer of Care Note  Patient: Catherine Cole  Procedure(s) Performed: BILATERAL MASTECTOMY WITH RIGHT SENTINEL LYMPH NODE BIOPSY, LEFT RISK REDUCING MASTECTOMY (Bilateral Breast)  Patient Location: PACU  Anesthesia Type:GA combined with regional for post-op pain  Level of Consciousness: sedated  Airway & Oxygen Therapy: Patient Spontanous Breathing and Patient connected to nasal cannula oxygen  Post-op Assessment: Report given to RN and Post -op Vital signs reviewed and stable  Post vital signs: Reviewed and stable  Last Vitals:  Vitals Value Taken Time  BP 111/70 07/25/19 1530  Temp    Pulse 70 07/25/19 1533  Resp 19 07/25/19 1533  SpO2 97 % 07/25/19 1533  Vitals shown include unvalidated device data.  Last Pain:  Vitals:   07/25/19 1205  TempSrc: Temporal  PainSc: 0-No pain      Patients Stated Pain Goal: 4 ([id number])  Complications: No apparent anesthesia complications

## 2019-07-25 NOTE — Progress Notes (Signed)
Assisted Dr. Gifford Shave with right, left, ultrasound guided, pectoralis block. Side rails up, monitors on throughout procedure. See vital signs in flow sheet. Tolerated Procedure well.

## 2019-07-25 NOTE — Progress Notes (Signed)
Emotional support during breast injections °

## 2019-07-26 ENCOUNTER — Encounter: Payer: Self-pay | Admitting: *Deleted

## 2019-07-26 DIAGNOSIS — D0511 Intraductal carcinoma in situ of right breast: Secondary | ICD-10-CM | POA: Diagnosis not present

## 2019-07-26 NOTE — Discharge Summary (Signed)
Physician Discharge Summary  Patient ID: Catherine Cole MRN: [id number] DOB/AGE: [id number] y.o.  Admit date: 07/25/2019 Discharge date: 07/26/2019  Admission Diagnoses:  Right breast DCIS   Discharge Diagnoses: same    Discharged Condition: good  Hospital Course: Pt did well after bilateral mastectomies. He pain was well controlled, drain output appropriate, tolerated diet and ambulated.        Treatments: surgery: bilateral simple mastectomy right sentinel lymph node mapping.   Discharge Exam: Blood pressure 107/69, pulse 78, temperature 98.2 F (36.8 C), resp. rate 16, height 5\' 7"  (1.702 m), weight 89.8 kg, SpO2 97 %. General appearance: alert and cooperative Resp: clear to auscultation bilaterally Cardio: normal apical impulse Incision/Wound:bilateral  mastectomy CDI no hematoma skin flaps viable   Disposition: Discharge disposition: 01-Home or Self Care       Discharge Instructions    Diet - low sodium heart healthy   Complete by: As directed    Increase activity slowly   Complete by: As directed         Signed: Joyice Faster Laverle Pillard 07/26/2019, 8:34 AM

## 2019-07-26 NOTE — Discharge Instructions (Signed)
[facility] °[phone] ° °MASTECTOMY: POST OP INSTRUCTIONS ° °Always review your discharge instruction sheet given to you by the facility where your surgery was performed. °IF YOU HAVE DISABILITY OR FAMILY LEAVE FORMS, YOU MUST BRING THEM TO THE OFFICE FOR PROCESSING.   °DO NOT GIVE THEM TO YOUR DOCTOR. °A prescription for pain medication may be given to you upon discharge.  Take your pain medication as prescribed, if needed.  If narcotic pain medicine is not needed, then you may take acetaminophen (Tylenol) or ibuprofen (Advil) as needed. °1. Take your usually prescribed medications unless otherwise directed. °2. If you need a refill on your pain medication, please contact your pharmacy.  They will contact our office to request authorization.  Prescriptions will not be filled after 5pm or on week-ends. °3. You should follow a light diet the first few days after arrival home, such as soup and crackers, etc.  Resume your normal diet the day after surgery. °4. Most patients will experience some swelling and bruising on the chest and underarm.  Ice packs will help.  Swelling and bruising can take several days to resolve.  °5. It is common to experience some constipation if taking pain medication after surgery.  Increasing fluid intake and taking a stool softener (such as Colace) will usually help or prevent this problem from occurring.  A mild laxative (Milk of Magnesia or Miralax) should be taken according to package instructions if there are no bowel movements after 48 hours. °6. Unless discharge instructions indicate otherwise, leave your bandage dry and in place until your next appointment in 3-5 days.  You may take a limited sponge bath.  No tube baths or showers until the drains are removed.  You may have steri-strips (small skin tapes) in place directly over the incision.  These strips should be left on the skin for 7-10 days.  If your surgeon used skin glue on the incision, you may  shower in 24 hours.  The glue will flake off over the next 2-3 weeks.  Any sutures or staples will be removed at the office during your follow-up visit. °7. DRAINS:  If you have drains in place, it is important to keep a list of the amount of drainage produced each day in your drains.  Before leaving the hospital, you should be instructed on drain care.  Call our office if you have any questions about your drains. °8. ACTIVITIES:  You may resume regular (light) daily activities beginning the next day--such as daily self-care, walking, climbing stairs--gradually increasing activities as tolerated.  You may have sexual intercourse when it is comfortable.  Refrain from any heavy lifting or straining until approved by your doctor. °a. You may drive when you are no longer taking prescription pain medication, you can comfortably wear a seatbelt, and you can safely maneuver your car and apply brakes. °b. RETURN TO WORK:  __________________________________________________________ °9. You should see your doctor in the office for a follow-up appointment approximately 3-5 days after your surgery.  Your doctor’s nurse will typically make your follow-up appointment when she calls you with your pathology report.  Expect your pathology report 2-3 business days after your surgery.  You may call to check if you do not hear from us after three days.   °10. OTHER INSTRUCTIONS: ______________________________________________________________________________________________ ____________________________________________________________________________________________ ° °WHEN TO CALL YOUR DOCTOR: °1. Fever over 101.0 °2. Nausea and/or vomiting °3. Extreme swelling or bruising °4. Continued bleeding from incision. °5. Increased pain, redness, or drainage from the   incision. ° °The clinic staff is available to answer your questions during regular business hours.  Please don’t hesitate to call and ask to speak to one of the nurses for clinical  concerns.  If you have a medical emergency, go to the nearest emergency room or call 911.  A surgeon from [facility] is always on call at the hospital. °[address], [address], [place], [place]  [address] ? [address] [address], , [place]   [address] °[phone] ? [phone] ? FAX [phone] °Web site: [facility] ° °About my Jackson-Pratt Bulb Drain ° °What is a Jackson-Pratt bulb? °A Jackson-Pratt is a soft, round device used to collect drainage. It is connected to a long, thin drainage catheter, which is held in place by one or two small stiches near your surgical incision site. When the bulb is squeezed, it forms a vacuum, forcing the drainage to empty into the bulb. ° °Emptying the Jackson-Pratt bulb- °To empty the bulb: °1. Release the plug on the top of the bulb. °2. Pour the bulb's contents into a measuring container which your nurse will provide. °3. Record the time emptied and amount of drainage. Empty the drain(s) as often as your     doctor or nurse recommends. ° °Date                  Time                    Amount (Drain 1)                 Amount (Drain 2) ° °_____________________________________________________________________ ° °_____________________________________________________________________ ° °_____________________________________________________________________ ° °_____________________________________________________________________ ° °_____________________________________________________________________ ° °_____________________________________________________________________ ° °_____________________________________________________________________ ° °_____________________________________________________________________ ° °Squeezing the Jackson-Pratt Bulb- °To squeeze the bulb: °1. Make sure the plug at the top of the bulb is open. °2. Squeeze the bulb tightly in your fist. You will hear air squeezing from the bulb. °3. Replace the plug while the bulb is squeezed. °4.  Use a safety pin to attach the bulb to your clothing. This will keep the catheter from     pulling at the bulb insertion site. ° °When to call your doctor- °Call your doctor if: °· Drain site becomes red, swollen or hot. °· You have a fever greater than 101 degrees F. °· There is oozing at the drain site. °· Drain falls out (apply a guaze bandage over the drain hole and secure it with tape). °· Drainage increases daily not related to activity patterns. (You will usually have more drainage when you are active than when you are resting.) °· Drainage has a bad odor. ° °

## 2019-07-26 NOTE — Addendum Note (Signed)
Addendum  created 07/26/19 1251 by Tawni Millers, CRNA   Charge Capture section accepted

## 2019-07-30 ENCOUNTER — Encounter: Payer: Self-pay | Admitting: *Deleted

## 2019-07-30 LAB — SURGICAL PATHOLOGY

## 2019-08-03 ENCOUNTER — Telehealth: Payer: Self-pay | Admitting: Genetic Counselor

## 2019-08-03 ENCOUNTER — Encounter: Payer: Self-pay | Admitting: Genetic Counselor

## 2019-08-03 DIAGNOSIS — Z1379 Encounter for other screening for genetic and chromosomal anomalies: Secondary | ICD-10-CM | POA: Insufficient documentation

## 2019-08-03 NOTE — Telephone Encounter (Signed)
LM on VM that results were back and to please cB.

## 2019-08-06 NOTE — Telephone Encounter (Signed)
LM on VM that results are back and to please call. 

## 2019-08-07 ENCOUNTER — Encounter: Payer: Self-pay | Admitting: Genetic Counselor

## 2019-08-07 NOTE — Progress Notes (Signed)
Patient Care Team: Rutherford Guys, MD as PCP - General (Family Medicine) Ena Dawley, MD as Consulting Physician (Obstetrics and Gynecology) Leonie Man, MD as Consulting Physician (Cardiology) Juanita Craver, MD as Consulting Physician (Gastroenterology) Mauro Kaufmann, RN as Oncology Nurse Navigator Rockwell Germany, RN as Oncology Nurse Navigator  DIAGNOSIS:    ICD-10-CM   1. Ductal carcinoma in situ (DCIS) of right breast  D05.11     SUMMARY OF ONCOLOGIC HISTORY: Oncology History  Ductal carcinoma in situ (DCIS) of right breast  07/02/2019 Initial Biopsy   Diffuse left breast pain and itching. Diagnostic mammogram on 06/27/19 showed right breast calcifications. 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  The CustomNext-Cancer gene panel offered by [facility] and includes sequencing and rearrangement analysis for the following 91 genes: AIP, ALK, APC*, ATM*, AXIN2, BAP1, BARD1, BLM, BMPR1A, BRCA1*, BRCA2*, BRIP1*, CDC73, CDH1*, CDK4, CDKN1B, CDKN2A, CHEK2*, CTNNA1, DICER1, FANCC, FH, FLCN, GALNT12, KIF1B, LZTR1, MAX, MEN1, MET, MLH1*, MRE11A, MSH2*, MSH3, MSH6*, MUTYH*, NBN, NF1*, NF2, NTHL1, PALB2*, PHOX2B, PMS2*, POT1, PRKAR1A, PTCH1, PTEN*, RAD50, RAD51C*, RAD51D*, RB1, RECQL, RET, SDHA, SDHAF2, SDHB, SDHC, SDHD, SMAD4, SMARCA4, SMARCB1, SMARCE1, STK11, SUFU, TMEM127, TP53*, TSC1, TSC2, VHL and XRCC2 (sequencing and deletion/duplication); CASR, CFTR, CPA1, CTRC, EGFR, EGLN1, FAM175A, HOXB13, KIT, MITF, MLH3, PALLD, PDGFRA, POLD1, POLE, PRSS1, RINT1, RPS20, SPINK1 and TERT (sequencing only); EPCAM and GREM1 (deletion/duplication only). DNA and  RNA analyses performed for * genes. The report date is August 01, 2019.     CHIEF COMPLIANT: Follow-up s/p bilateral mastectomies to review pathology  INTERVAL HISTORY: Valen Mascaro is a 64 y.o. with above-mentioned history of right breast DCIS. She underwent bilateral mastectomies on 07/25/19 with Dr. Brantley Stage for which pathology showed no left breast malignancy, and in the right breast, high grade DCIS, 0.6cm, clear margins, 2 right axillary lymph nodes negative for carcinoma. She presents to the clinic today to review the pathology report and discuss further treatment.   ALLERGIES:  is allergic to latex; demerol; codeine; erythromycin; and neosporin [neomycin-polymyxin b gu].  MEDICATIONS:  Current Outpatient Medications  Medication Sig Dispense Refill  . busPIRone (BUSPAR) 10 MG tablet TAKE 1 TABLET BY MOUTH EVERYDAY AT BEDTIME 90 tablet 1  . escitalopram (LEXAPRO) 20 MG tablet TAKE 2 TABLETS BY MOUTH EVERY DAY 180 tablet 1  . ibuprofen (ADVIL) 800 MG tablet Take 1 tablet (800 mg total) by mouth every 8 (eight) hours as needed. 30 tablet 0  . Incontinence Supply Disposable (DEPEND EASY FIT UNDERGARMENTS) MISC Use daily as directed. Change several times daily as needed. 120 each 99  . lisdexamfetamine (VYVANSE) 50 MG capsule Take 1 capsule (50 mg total) by mouth daily before breakfast. 30 capsule 0  . lisdexamfetamine (VYVANSE) 50 MG capsule Take 1 capsule (50 mg total) by mouth daily. Ok to fill 30 days after signature 30 capsule 0  . lisdexamfetamine (VYVANSE) 50 MG capsule Take 1 capsule (50 mg total) by mouth daily. Ok to fill 60 days after signature 30 capsule 0  . Multiple Vitamin (MULTIVITAMIN WITH MINERALS) TABS tablet Take 1 tablet by mouth daily.    Marland Kitchen oxyCODONE (OXY IR/ROXICODONE) 5 MG immediate release tablet  Take 1 tablet (5 mg total) by mouth every 6 (six) hours as needed for severe pain. 15 tablet 0  . triamterene-hydrochlorothiazide (MAXZIDE-25) 37.5-25 MG tablet Take 1  tablet by mouth daily. 90 tablet 3  . valACYclovir (VALTREX) 500 MG tablet Take 2 tablets by mouth at acute onset for outbreak, followed by 1 tablet once a day x 5 days 30 tablet 3  . Vitamin D, Ergocalciferol, (DRISDOL) 1.25 MG (50000 UNIT) CAPS capsule TAKE 1 CAPSULE BY MOUTH EVERY 7 DAYS 12 capsule 0   No current facility-administered medications for this visit.    PHYSICAL EXAMINATION: ECOG PERFORMANCE STATUS: 1 - Symptomatic but completely ambulatory  Vitals:   08/08/19 1422  BP: (!) 163/89  Pulse: 96  Resp: 18  Temp: 98.3 F (36.8 C)  SpO2: 97%   Filed Weights   08/08/19 1422  Weight: 193 lb 6.4 oz (87.7 kg)    LABORATORY DATA:  I have reviewed the data as listed CMP Latest Ref Rng & Units 07/17/2019 03/19/2019 12/12/2018  Glucose 70 - 99 mg/dL 106(H) 116(H) 101(H)  BUN 8 - 23 mg/dL _0 Creatinine 0.44 - 1.00 mg/dL 0.99 0.78 0.81  Sodium 135 - 145 mmol/L 138 140 140  Potassium 3.5 - 5.1 mmol/L 3.7 3.4(L) 3.7  Chloride 98 - 111 mmol/L 99 98 97  CO2 22 - 32 mmol/L _1 Calcium 8.9 - 10.3 mg/dL 9.6 9.7 9.9  Total Protein 6.5 - 8.1 g/dL 7.2 7.1 7.0  Total Bilirubin 0.3 - 1.2 mg/dL 0.9 0.4 0.5  Alkaline Phos 38 - 126 U/L 66 88 82  AST 15 - 41 U/L _2 ALT 0 - 44 U/L _3 Lab Results  Component Value Date   WBC 9.3 07/17/2019   HGB 13.4 07/17/2019   HCT 40.1 07/17/2019   MCV 87.6 07/17/2019   PLT 267 07/17/2019   NEUTROABS 5.5 07/17/2019    ASSESSMENT & PLAN:  Ductal carcinoma in situ (DCIS) of right breast 07/02/2019:diffuse left breast pain and itching. Diagnostic mammogram on 06/27/19 showed right breast calcifications. Biopsy on 07/02/19 showed high grade DCIS with calcifications, ER/PR negative  07/25/2019: Bilateral mastectomies: Left mastectomy: Fibrocystic changes no malignancy. Right mastectomy: High-grade DCIS with calcifications and necrosis, margins negative, 0.6 cm, 0/2 lymph nodes negative.  Previous ER/PR negative  Pathology  counseling: I discussed the final pathology report of the patient provided  a copy of this report. I discussed the margins as well as lymph node surgeries. We also discussed the final staging along with previously performed ER/PR testing.  I provided her with a copy of her genetics report.  I explained to her that the VUS in the MLH1 gene is not considered clinically actionable.  Recommendation: Since she had bilateral mastectomies there is no role of radiation or antiestrogen therapy. She could be followed by Dr. Brantley Stage for her annual check ins. We can see her on an as-needed basis.   No orders of the defined types were placed in this encounter.  The patient has a good understanding of the overall plan. she agrees with it. she will call with any problems that may develop before the next visit here.  Total time spent: 30 mins including face to face time and time spent for planning, charting and coordination of care  Nicholas Lose, MD 08/08/2019  I, Cloyde Reams Dorshimer, am acting as scribe for Dr. Nicholas Lose.  I have reviewed the above documentation for accuracy  and completeness, and I agree with the above.       

## 2019-08-07 NOTE — Telephone Encounter (Signed)
Left a third message on VM that results are back.  I will send a letter to patient to please call for test results.

## 2019-08-08 ENCOUNTER — Inpatient Hospital Stay: Payer: BC Managed Care – PPO | Attending: Hematology and Oncology | Admitting: Hematology and Oncology

## 2019-08-08 ENCOUNTER — Encounter: Payer: Self-pay | Admitting: *Deleted

## 2019-08-08 ENCOUNTER — Other Ambulatory Visit: Payer: Self-pay

## 2019-08-08 DIAGNOSIS — Z79899 Other long term (current) drug therapy: Secondary | ICD-10-CM | POA: Diagnosis not present

## 2019-08-08 DIAGNOSIS — Z9013 Acquired absence of bilateral breasts and nipples: Secondary | ICD-10-CM | POA: Insufficient documentation

## 2019-08-08 DIAGNOSIS — D0511 Intraductal carcinoma in situ of right breast: Secondary | ICD-10-CM | POA: Diagnosis not present

## 2019-08-08 DIAGNOSIS — Z791 Long term (current) use of non-steroidal anti-inflammatories (NSAID): Secondary | ICD-10-CM | POA: Diagnosis not present

## 2019-08-08 DIAGNOSIS — Z171 Estrogen receptor negative status [ER-]: Secondary | ICD-10-CM | POA: Insufficient documentation

## 2019-08-08 NOTE — Assessment & Plan Note (Signed)
07/02/2019:diffuse left breast pain and itching. Diagnostic mammogram on 06/27/19 showed right breast calcifications. Biopsy on 07/02/19 showed high grade DCIS with calcifications, ER/PR negative  07/25/2019: Bilateral mastectomies: Left mastectomy: Fibrocystic changes no malignancy. Right mastectomy: High-grade DCIS with calcifications and necrosis, margins negative, 0.6 cm, 0/2 lymph nodes negative.  Previous ER/PR negative  Pathology counseling: I discussed the final pathology report of the patient provided  a copy of this report. I discussed the margins as well as lymph node surgeries. We also discussed the final staging along with previously performed ER/PR testing.  Recommendation: Since she had bilateral mastectomies there is no role of radiation or antiestrogen therapy. She could be followed by Dr. Brantley Stage for her annual check ins. We can see her on an as-needed basis.

## 2019-08-21 ENCOUNTER — Other Ambulatory Visit: Payer: Self-pay | Admitting: *Deleted

## 2019-08-21 DIAGNOSIS — D0511 Intraductal carcinoma in situ of right breast: Secondary | ICD-10-CM

## 2019-08-30 ENCOUNTER — Ambulatory Visit: Payer: BC Managed Care – PPO | Attending: Hematology and Oncology

## 2019-08-30 ENCOUNTER — Other Ambulatory Visit: Payer: Self-pay

## 2019-08-30 DIAGNOSIS — M25612 Stiffness of left shoulder, not elsewhere classified: Secondary | ICD-10-CM | POA: Insufficient documentation

## 2019-08-30 DIAGNOSIS — D0511 Intraductal carcinoma in situ of right breast: Secondary | ICD-10-CM | POA: Diagnosis present

## 2019-08-30 DIAGNOSIS — M25611 Stiffness of right shoulder, not elsewhere classified: Secondary | ICD-10-CM | POA: Insufficient documentation

## 2019-08-30 DIAGNOSIS — M25511 Pain in right shoulder: Secondary | ICD-10-CM | POA: Insufficient documentation

## 2019-08-30 DIAGNOSIS — M25512 Pain in left shoulder: Secondary | ICD-10-CM

## 2019-08-30 NOTE — Therapy (Signed)
[place], [place], [address] Phone: [phone]   Fax:  [phone]  Physical Therapy Evaluation  Patient Details  Name: Catherine Cole MRN: [id number] Date of Birth: [birth year] Referring Provider (PT): Nicholas Lose MD   Encounter Date: 08/30/2019  PT End of Session - 08/30/19 0854    Visit Number  1    Activity Tolerance  Patient tolerated treatment well    Behavior During Therapy  [facility] for tasks assessed/performed       Past Medical History:  Diagnosis Date  . Adult ADHD   . Anemia   . Anxiety   . Complication of anesthesia   . Depression   . Family history of breast cancer   . Family history of colon cancer   . Family history of kidney cancer   . Family history of melanoma   . Family history of ovarian cancer   . Hypertension   . IBS (irritable bowel syndrome)   . PONV (postoperative nausea and vomiting)     Past Surgical History:  Procedure Laterality Date  . APPENDECTOMY     12 yrs  . CHOLECYSTECTOMY  1995  . CHOLESTEATOMA EXCISION     13 and 22 yrs  . FRACTURE SURGERY    . [place]  . LESION DESTRUCTION N/A 12/03/2015   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;  Surgeon: [name], DO;  Location: [place];  Service: Plastics;  Laterality: N/A;  . MASTECTOMY W/ SENTINEL NODE BIOPSY Bilateral 07/25/2019   Procedure: BILATERAL MASTECTOMY WITH RIGHT SENTINEL LYMPH NODE BIOPSY, LEFT RISK REDUCING MASTECTOMY;  Surgeon: [name], MD;  Location: [place];  Service: General;  Laterality: Bilateral;  PEC BLOCK  . TONSILLECTOMY     15 yrs    There were no vitals filed for this visit.   Subjective Assessment - 08/30/19 0807    Subjective  Pt reports that she had a bil masectomy about 5 weeks ago and she feels that she is doing relatively well. She has an appointment with the PA today because  she is worried about a seroma on the L side without weeping. Pt is reporting some phantom pains in her nipples. She states that she is starting back work part time.    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 0/2 lymph nodes positive    Patient Stated Goals  I want to get rid of the fluid and make sure that I am not doing any thing detrimental to my recovery especially in the R arm.    Currently in Pain?  Yes    Pain Score  1     Pain Location  Axilla    Pain Orientation  Right;Left    Pain Descriptors / Indicators  Burning;Aching    Pain Type  Surgical pain    Pain Radiating Towards  toward the nipples    Pain Onset  More than a month ago    Pain Frequency  Intermittent    Aggravating Factors   driving, prolonged activities    Pain Relieving Factors  propping up her arms on the masectomy pillow    Effect of Pain on Daily Activities  Pt has just returned to working part time.         [facility] PT Assessment - 08/30/19 0001      Assessment   Medical Diagnosis  R breast  cancer     Referring Provider (PT)  Nicholas Lose MD    Onset Date/Surgical Date  07/25/19    Hand Dominance  Right    Next MD Visit  08/30/2019    Prior Therapy  None      Precautions   Precautions  Other (comment)    Precaution Comments  Breast cancer and bil Masectomy       Restrictions   Weight Bearing Restrictions  No      Balance Screen   Has the patient fallen in the past 6 months  No    Has the patient had a decrease in activity level because of a fear of falling?   Yes    Is the patient reluctant to leave their home because of a fear of falling?   No      Home Film/video editor residence    Living Arrangements  Alone;Other (Comment)   cats   Type of Home  Apartment    Home Access  Stairs to enter    Additional Comments  pt has no trouble with the stairs.       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       Coordination   Gross Motor Movements are Fluid and Coordinated  Yes      Posture/Postural Control   Posture/Postural Control  Postural limitations    Postural Limitations  Rounded Shoulders;Forward head      ROM / Strength   AROM / PROM / Strength  AROM      AROM   AROM Assessment Site  Shoulder    Right/Left Shoulder  Right;Left    Right Shoulder Flexion  145 Degrees    Right Shoulder ABduction  100 Degrees    Right Shoulder Internal Rotation  55 Degrees    Right Shoulder External Rotation  80 Degrees    Left Shoulder Flexion  115 Degrees    Left Shoulder ABduction  102 Degrees    Left Shoulder Internal Rotation  67 Degrees    Left Shoulder External Rotation  7 Degrees        LYMPHEDEMA/ONCOLOGY QUESTIONNAIRE - 08/30/19 0833      Type   Cancer Type  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       Surgeries   Mastectomy Date  07/25/19    Sentinel Lymph Node Biopsy Date  07/25/19    Number Lymph Nodes Removed  2      Treatment   Active Chemotherapy Treatment  No    Past Chemotherapy Treatment  No    Active Radiation Treatment  No    Past Radiation Treatment  No    Current Hormone Treatment  No    Past Hormone Therapy  No      What other symptoms do you have   Are you Having Heaviness or Tightness  Yes  Are you having Pain  Yes    Are you having pitting edema  No    Is it Hard or Difficult finding clothes that fit  No    Do you have infections  No    Is there Decreased scar mobility  Yes      Lymphedema  Assessments   Lymphedema Assessments  Upper extremities      Right Upper Extremity Lymphedema   15 cm Proximal to Olecranon Process  32.5 cm    10 cm Proximal to Olecranon Process  30.9 cm    Olecranon Process  26.8 cm    15 cm Proximal to Ulnar Styloid Process  26.4 cm    10 cm Proximal to Ulnar Styloid Process  23.5 cm    Just Proximal to Ulnar Styloid Process  16.2 cm    Across Hand at [employer]  18.7 cm    At [place] of 2nd Digit  5.6 cm      Left Upper Extremity Lymphedema   15 cm Proximal to Olecranon Process  31.4 cm    10 cm Proximal to Olecranon Process  30.1 cm    Olecranon Process  26.4 cm    15 cm Proximal to Ulnar Styloid Process  26.6 cm    10 cm Proximal to Ulnar Styloid Process  22.9 cm    Just Proximal to Ulnar Styloid Process  16 cm    Across Hand at [employer]  18.8 cm    At [place] of 2nd Digit  5.9 cm               Objective measurements completed on examination: See above findings.              PT Education - 08/30/19 0847    Education Details  Pt was educated on they anatomy and physiology of the lymphatic system. Discussed the need for compression and risk for lymphedema following a masectomy including that due to no radiation and only 2 lymph node removal she has a relatively low risk. Discussed lymphedema risk reduction precautions. Pt was educated on post op exercises.    Person(s) Educated  Patient    Methods  Explanation;Demonstration;Verbal cues;Handout    Comprehension  Verbalized understanding;Returned demonstration       PT Short Term Goals - 08/30/19 1144      PT SHORT TERM GOAL #1   Title  Pt will be independent with HEP within 1 week in order to demonstrate autonomy of care.    Baseline  pt currently does not have an HEP    Time  1    Period  Weeks    Status  New    Target Date  09/13/19        PT Long Term Goals - 08/30/19 1146      PT LONG TERM GOAL #1   Title  Pt will demonstrate 120 degrees or greater  of L shoulder flexion/abduction and R shoulder abduction within 3 weeks in order to return to functional ROM.    Baseline  R shoulder flexion: 145, abduction: 100, L shoulder flexion: 115 abduction: 102    Time  3    Period  Weeks    Status  New    Target Date  09/27/19      PT LONG TERM GOAL #2   Title  Pt will report 50% improvement in pain and functional mobility within 3 weeks in order to demonstrate a subjective improvement in function  of the bil UE.    Baseline  1/10 pain and pt is not performing all her regular activities at home including house work.    Time  3    Period  Weeks    Status  New    Target Date  09/27/19      PT LONG TERM GOAL #3   Title  Pt will demonstrate improve upright posture within 3 weeks as demonstrated by decreased anterior placement of the R shoulder.    Baseline  significant anterior rotation as demonstrated by caving on the R due to tightness    Time  3    Period  Weeks    Status  New    Target Date  09/27/19             Plan - 08/30/19 0854    Clinical Impression Statement  Pt presents to physical therapy 5 weeks following bil masectomy with 2 lymph node removal on the R. Her incisions are healing and well and overall pt states that she does not have a significant amount of pain except when moving. She is mostly concerned about returning to activity and how to return to physical activity since her surgery. Pt demonstrates no incease in edema in her bil UE due to circumferential measurements. She demonstrates significant loss of functional ROM in her BUE but is able to stretch into more functional ROM with post-op stretching. Pt will benefit from skilled physical therapy services 1x/week for 3 weeks in order to progress to functional ROM and return to prior level of function.    Personal Factors and Comorbidities  Comorbidity 1    Comorbidities  Bil masectomy with 2 lymph node removal on the R    Stability/Clinical Decision Making   Stable/Uncomplicated    Clinical Decision Making  Low    Rehab Potential  Good    PT Frequency  1x / week   pt has a lot of help at home and is doing well.   PT Duration  3 weeks    PT Treatment/Interventions  Functional mobility training;Therapeutic activities;Neuromuscular re-education;Manual techniques;Moist Heat    PT Next Visit Plan  continue with exercises, P/ROM and AA/ROM as well as stretching, myofascial release and STM as needed.    PT Home Exercise Plan  post-op exercises    Recommended Other Services  compression cami if pt wants something over than binder    Consulted and Agree with Plan of Care  Patient       Patient will benefit from skilled therapeutic intervention in order to improve the following deficits and impairments:  Increased edema, Postural dysfunction, Decreased range of motion, Pain  Visit Diagnosis: Ductal carcinoma in situ (DCIS) of right breast  Stiffness of right shoulder, not elsewhere classified  Stiffness of left shoulder, not elsewhere classified  Acute pain of right shoulder  Acute pain of left shoulder     Problem List Patient Active Problem List   Diagnosis Date Noted  . Genetic testing 08/03/2019  . Breast cancer, right ([place]) 07/25/2019  . 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 Family history of ovarian cancer   . Family history of breast cancer   . Family history of colon cancer   . Family history of kidney cancer   . Family history of melanoma   . Vitamin D deficiency 12/12/2018  . Essential hypertension, benign 10/15/2015  . Kidney stone 05/02/2015  . Palpitations 02/08/2015  . Atypical chest pain 02/08/2015  .  Exertional dyspnea 02/08/2015  . Lower extremity edema 02/08/2015  . Mixed dyslipidemia 03/26/2014  . 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/03/2013  . Depression with anxiety 04/26/2012  . ADHD (attention deficit hyperactivity disorder) 04/26/2012    Ander Purpura, PT 08/30/2019,  11:50 AM  [place], [place], [address] Phone: [phone]   Fax:  [phone]  Name: Jolanda Azizi MRN: [id number] Date of Birth: [birth year]

## 2019-09-10 ENCOUNTER — Ambulatory Visit: Payer: BC Managed Care – PPO

## 2019-09-13 ENCOUNTER — Ambulatory Visit: Payer: BC Managed Care – PPO

## 2019-09-13 ENCOUNTER — Other Ambulatory Visit: Payer: Self-pay

## 2019-09-13 DIAGNOSIS — M25612 Stiffness of left shoulder, not elsewhere classified: Secondary | ICD-10-CM

## 2019-09-13 DIAGNOSIS — D0511 Intraductal carcinoma in situ of right breast: Secondary | ICD-10-CM | POA: Diagnosis not present

## 2019-09-13 DIAGNOSIS — M25512 Pain in left shoulder: Secondary | ICD-10-CM

## 2019-09-13 DIAGNOSIS — M25611 Stiffness of right shoulder, not elsewhere classified: Secondary | ICD-10-CM

## 2019-09-13 DIAGNOSIS — M25511 Pain in right shoulder: Secondary | ICD-10-CM

## 2019-09-13 NOTE — Patient Instructions (Addendum)
Deep Effective Breath   Standing, sitting, or laying down, place both hands on the belly. Take a deep breath IN, expanding the belly; then breath OUT, contracting the belly. Repeat __5__ times. Do __2-3__ sessions per day and before your self massage.  http://gt2.exer.us/866   Copyright  VHI. All rights reserved.    Hug yourself and make 10 circles with hand just below the collar bone relaxed  On both sides make 10 circles in the armpit, then pump _5__ times from involved armpit across chest to uninvolved armpit, making a pathway. Do _1__ time per day.  Copyright  VHI. All rights reserved.  Axilla to Inguinal Nodes - Sweep   On both sides, make 10 circles at groin at panty line, then one side at a time pump _5__ times from armpit along side of trunk to outer hip (you can do more passes on the L side), making your other pathway. Do __1_ time per day.  Copyright  VHI. All rights reserved.  Arm Posterior: Elbow to Shoulder - Sweep   Pump _5__ times from back of elbow to top of shoulder. Then inner to outer upper arm _5_ times, then outer arm again _5_ times. Then back to the pathways _2-3_ times. Do _1__ time per day.  Copyright  VHI. All rights reserved.  ARM: [place] Wrist to Elbow - Sweep   Pump or stationary circles _5__ times from wrist to elbow making sure to do both sides of the forearm. Then retrace your steps to the outer arm, and the pathways _2-3_ times each. Do _1__ time per day.  Copyright  VHI. All rights reserved.  ARM: Dorsum of Hand to Shoulder - Sweep   Pump or stationary circles _5__ times on back of hand including knuckle spaces and individual fingers if needed working up towards the wrist, then retrace all your steps working back up the forearm, doing both sides; upper outer arm and back to your pathways _2-3_ times each. Then do 5 circles again at uninvolved armpit and involved groin where you started! Good job!!  You can work on the R arm but spend more  time on the left because this is where you have the most aching pain and fluid.  Do __1_ time per day.  Copyright  VHI. All rights reserved.

## 2019-09-13 NOTE — Therapy (Signed)
[place], [place], [address] Phone: [phone]   Fax:  [phone]  Physical Therapy Treatment  Patient Details  Name: Catherine Cole MRN: [id number] Date of Birth: [birth year] Referring Provider (PT): Nicholas Lose MD   Encounter Date: 09/13/2019  PT End of Session - 09/13/19 1108    Visit Number  2    Number of Visits  3    PT Start Time  1105    PT Stop Time  1200    PT Time Calculation (min)  55 min    Activity Tolerance  Patient tolerated treatment well    Behavior During Therapy  [facility] for tasks assessed/performed       Past Medical History:  Diagnosis Date  . Adult ADHD   . Anemia   . Anxiety   . Complication of anesthesia   . Depression   . Family history of breast cancer   . Family history of colon cancer   . Family history of kidney cancer   . Family history of melanoma   . Family history of ovarian cancer   . Hypertension   . IBS (irritable bowel syndrome)   . PONV (postoperative nausea and vomiting)     Past Surgical History:  Procedure Laterality Date  . APPENDECTOMY     12 yrs  . CHOLECYSTECTOMY  1995  . CHOLESTEATOMA EXCISION     13 and 22 yrs  . FRACTURE SURGERY    . [place]  . LESION DESTRUCTION N/A 12/03/2015   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;  Surgeon: [name], DO;  Location: [place];  Service: Plastics;  Laterality: N/A;  . MASTECTOMY W/ SENTINEL NODE BIOPSY Bilateral 07/25/2019   Procedure: BILATERAL MASTECTOMY WITH RIGHT SENTINEL LYMPH NODE BIOPSY, LEFT RISK REDUCING MASTECTOMY;  Surgeon: [name], MD;  Location: [place];  Service: General;  Laterality: Bilateral;  PEC BLOCK  . TONSILLECTOMY     15 yrs    There were no vitals filed for this visit.  Subjective Assessment - 09/13/19 1109    Subjective  Pt states that she has been trying to pay  attention to her 10 lb limit. If she carries anything she gets an aching pain in her arms. She had a real bad aching pain in her L arm over the weekend when she was helping someone with some groceries.    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 0/2 lymph nodes positive    Patient Stated Goals  I want to get rid of the fluid and make sure that I am not doing any thing detrimental to my recovery especially in the R arm.    Currently in Pain?  No/denies    Pain Score  0-No pain                        [facility] Adult PT Treatment/Exercise - 09/13/19 0001      Manual Therapy   Manual Therapy  Manual Lymphatic Drainage (MLD)    Manual Lymphatic Drainage (MLD)  In supine: 5 diaphragmatic breathes, short neck, bil axillary and inguinal nodes, bil axillo-inguinal anastomosis, lateral brachium, medial to lateral brachium, lateral brachium, anterior/posterior antebrachium, re-working anterior/posterior arm, re-worked lymph nodes and anastomosis; 5 diaphragmatic breathes. Pt was walked through each step of MLD discussed focusing on the L due to increased fluid in  this area.              PT Education - 09/13/19 1153    Education Details  Pt was educated on performing self MLD for the anterior trunk and LUE this session modified for bil masectomy. Discussed that she can perform MLD on the RUE but to spend most of her time on the LUE due to increased pain/edema in this arm/chest area. An emphasis was put on skin stretch, direction and pressure with hand over hand tactile cueing, and voice cues.    Person(s) Educated  Patient    Methods  Explanation;Demonstration;Tactile cues;Verbal cues;Handout    Comprehension  Verbalized understanding;Returned demonstration       PT Short Term Goals - 08/30/19 1144      PT SHORT TERM GOAL #1   Title  Pt will be independent with HEP within 1 week in order to demonstrate autonomy of care.    Baseline   pt currently does not have an HEP    Time  1    Period  Weeks    Status  New    Target Date  09/13/19        PT Long Term Goals - 08/30/19 1146      PT LONG TERM GOAL #1   Title  Pt will demonstrate 120 degrees or greater of L shoulder flexion/abduction and R shoulder abduction within 3 weeks in order to return to functional ROM.    Baseline  R shoulder flexion: 145, abduction: 100, L shoulder flexion: 115 abduction: 102    Time  3    Period  Weeks    Status  New    Target Date  09/27/19      PT LONG TERM GOAL #2   Title  Pt will report 50% improvement in pain and functional mobility within 3 weeks in order to demonstrate a subjective improvement in function of the bil UE.    Baseline  1/10 pain and pt is not performing all her regular activities at home including house work.    Time  3    Period  Weeks    Status  New    Target Date  09/27/19      PT LONG TERM GOAL #3   Title  Pt will demonstrate improve upright posture within 3 weeks as demonstrated by decreased anterior placement of the R shoulder.    Baseline  significant anterior rotation as demonstrated by caving on the R due to tightness    Time  3    Period  Weeks    Status  New    Target Date  09/27/19            Plan - 09/13/19 1108    Clinical Impression Statement  Pt continues with edema in her lateral upper quadrant bil with L>R and with reports of aching pain after activity in the LUE. MLD was performed with pt in supine and then she was taught self MLD for the anterior trunk with a focus on the LUE due to pain this area. Pt had good return demonstration with tactile hand over hand and on pt demonstration as well as VC throughout. Pt will benefit from continued POC at this time.    Personal Factors and Comorbidities  Comorbidity 1    Comorbidities  Bil masectomy with 2 lymph node removal on the R    Rehab Potential  Good    PT Frequency  1x / week    PT Duration  3 weeks    PT Treatment/Interventions   Functional mobility training;Therapeutic activities;Neuromuscular re-education;Manual techniques;Moist Heat    PT Next Visit Plan  continue with exercises, P/ROM and AA/ROM as well as stretching, myofascial release and STM as needed.    PT Home Exercise Plan  post-op exercises    Recommended Other Services  provided with script for compression bra or cami. Pt has appt with [facility] today at 09/13/19    Consulted and Agree with Plan of Care  Patient       Patient will benefit from skilled therapeutic intervention in order to improve the following deficits and impairments:  Increased edema, Postural dysfunction, Decreased range of motion, Pain  Visit Diagnosis: Ductal carcinoma in situ (DCIS) of right breast  Stiffness of right shoulder, not elsewhere classified  Stiffness of left shoulder, not elsewhere classified  Acute pain of right shoulder  Acute pain of left shoulder     Problem List Patient Active Problem List   Diagnosis Date Noted  . Genetic testing 08/03/2019  . Breast cancer, right ([place]) 07/25/2019  . 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 Family history of ovarian cancer   . Family history of breast cancer   . Family history of colon cancer   . Family history of kidney cancer   . Family history of melanoma   . Vitamin D deficiency 12/12/2018  . Essential hypertension, benign 10/15/2015  . Kidney stone 05/02/2015  . Palpitations 02/08/2015  . Atypical chest pain 02/08/2015  . Exertional dyspnea 02/08/2015  . Lower extremity edema 02/08/2015  . Mixed dyslipidemia 03/26/2014  . 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/03/2013  . Depression with anxiety 04/26/2012  . ADHD (attention deficit hyperactivity disorder) 04/26/2012    Ander Purpura , PT 09/13/2019, 5:50 PM  [place], [place], [address] Phone: [phone]   Fax:  [phone]  Name:  Catherine Cole MRN: [id number] Date of Birth: [birth year]

## 2019-09-14 ENCOUNTER — Encounter: Payer: Self-pay | Admitting: Family Medicine

## 2019-09-14 ENCOUNTER — Other Ambulatory Visit: Payer: Self-pay

## 2019-09-14 ENCOUNTER — Ambulatory Visit (INDEPENDENT_AMBULATORY_CARE_PROVIDER_SITE_OTHER): Payer: BC Managed Care – PPO | Admitting: Family Medicine

## 2019-09-14 VITALS — BP 167/97 | HR 93 | Temp 98.0°F | Ht 67.0 in | Wt 194.0 lb

## 2019-09-14 DIAGNOSIS — D0511 Intraductal carcinoma in situ of right breast: Secondary | ICD-10-CM

## 2019-09-14 DIAGNOSIS — F418 Other specified anxiety disorders: Secondary | ICD-10-CM

## 2019-09-14 DIAGNOSIS — I10 Essential (primary) hypertension: Secondary | ICD-10-CM | POA: Diagnosis not present

## 2019-09-14 MED ORDER — LOSARTAN POTASSIUM 50 MG PO TABS: 50.0000 mg | ORAL_TABLET | Freq: Every day | ORAL | 3 refills | Status: DC

## 2019-09-14 MED ORDER — CHLORTHALIDONE 25 MG PO TABS: 25.0000 mg | ORAL_TABLET | Freq: Every day | ORAL | 3 refills | Status: DC

## 2019-09-14 MED ORDER — CITALOPRAM HYDROBROMIDE 40 MG PO TABS: 40.0000 mg | ORAL_TABLET | Freq: Every day | ORAL | 3 refills | Status: DC

## 2019-09-14 NOTE — Progress Notes (Signed)
5/28/20213:37 PM  Catherine Cole [birth year], 64 y.o., female [id number]  Chief Complaint  Patient presents with  . Hypertension    6 month follow up, just recieved breast cancer surgery. Says no blood draws today    HPI:   Patient is a 64 y.o. 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 Having increased anxiety 2/2 work She wonders if she is having side effects of lexapro as having eye sx that she researched might be related to lexapro, would like to try a different medication Continues to take buspar at bedtime Does not need refil of vyvanse as she was not taken daily while not at work, she will call when refill is needed Gad 7 noted   Lab Results  Component Value Date   CREATININE 0.99 07/17/2019   BUN 16 07/17/2019   NA 138 07/17/2019   K 3.7 07/17/2019   CL 99 07/17/2019   CO2 25 07/17/2019    GAD 7 : Generalized Anxiety Score 12/12/2018 09/29/2018 09/29/2018  Nervous, Anxious, on Edge 1 3 3   Control/stop worrying 0 3 3  Worry too much - different things 0 3 3  Trouble relaxing 0 3 3  Restless 0 0 0  Easily annoyed or irritable 0 1 1  Afraid - awful might happen 0 0 0  Total GAD 7 Score 1 13 13   Anxiety Difficulty Not difficult at all - -     Depression screen [facility] 2/9 09/14/2019 05/17/2019 03/19/2019  Decreased Interest 0 0 0  Down, Depressed, Hopeless 0 0 0  PHQ - 2 Score 0 0 0  Altered sleeping - - -  Tired, decreased energy - - -  Change in appetite - - -  Feeling bad or failure about yourself  - - -  Trouble concentrating - - -  Moving slowly or fidgety/restless - - -  Suicidal thoughts - - -  PHQ-9 Score - - -  Difficult doing work/chores - - -  Some recent data might be hidden    Fall Risk  09/14/2019 05/17/2019  03/19/2019 12/12/2018 09/29/2018  Falls in the past year? 0 0 0 0 -  Number falls in past yr: 0 0 0 0 0  Injury with Fall? 0 0 0 0 0     Allergies  Allergen Reactions  . Latex Hives  . Demerol Nausea And Vomiting  . Codeine Nausea And Vomiting  . Erythromycin Itching and Rash  . Neosporin [Neomycin-Polymyxin B Gu] Itching and Rash    Prior to Admission medications   Medication Sig Start Date End Date Taking? Authorizing Provider  busPIRone (BUSPAR) 10 MG tablet TAKE 1 TABLET BY MOUTH EVERYDAY AT BEDTIME 03/19/19  Yes Rutherford Guys, MD  escitalopram (LEXAPRO) 20 MG tablet TAKE 2 TABLETS BY MOUTH EVERY DAY 06/18/19  Yes Rutherford Guys, MD  ibuprofen (ADVIL) 800 MG tablet Take 1 tablet (800 mg total) by mouth every 8 (eight) hours as needed. 07/25/19  Yes Cornett, [name], MD  Incontinence Supply Disposable (DEPEND EASY FIT UNDERGARMENTS) MISC Use daily as directed. Change several times daily as needed. 06/29/18  Yes Rutherford Guys, MD  lisdexamfetamine (VYVANSE) 50 MG capsule Take 1 capsule (50 mg total) by mouth daily. Ok to fill 30 days after signature 05/17/19  Yes Grant Fontana M,  MD  lisdexamfetamine (VYVANSE) 50 MG capsule Take 1 capsule (50 mg total) by mouth daily. Ok to fill 60 days after signature 05/17/19  Yes Rutherford Guys, MD  Multiple Vitamin (MULTIVITAMIN WITH MINERALS) TABS tablet Take 1 tablet by mouth daily.   Yes [provider]  oxyCODONE (OXY IR/ROXICODONE) 5 MG immediate release tablet Take 1 tablet (5 mg total) by mouth every 6 (six) hours as needed for severe pain. 07/25/19  Yes Cornett, [name], MD  triamterene-hydrochlorothiazide (MAXZIDE-25) 37.5-25 MG tablet Take 1 tablet by mouth daily. 03/20/19  Yes Rutherford Guys, MD  valACYclovir (VALTREX) 500 MG tablet Take 2 tablets by mouth at acute onset for outbreak, followed by 1 tablet once a day x 5 days 12/12/18  Yes Rutherford Guys, MD  Vitamin D, Ergocalciferol, (DRISDOL) 1.25 MG (50000 UNIT) CAPS capsule  TAKE 1 CAPSULE BY MOUTH EVERY 7 DAYS 07/04/19  Yes Rutherford Guys, MD  lisdexamfetamine (VYVANSE) 50 MG capsule Take 1 capsule (50 mg total) by mouth daily before breakfast. 05/17/19 06/16/19  Rutherford Guys, MD    Past Medical History:  Diagnosis Date  . Adult ADHD   . Anemia   . Anxiety   . Complication of anesthesia   . Depression   . Family history of breast cancer   . Family history of colon cancer   . Family history of kidney cancer   . Family history of melanoma   . Family history of ovarian cancer   . Hypertension   . IBS (irritable bowel syndrome)   . PONV (postoperative nausea and vomiting)     Past Surgical History:  Procedure Laterality Date  . APPENDECTOMY     12 yrs  . CHOLECYSTECTOMY  1995  . CHOLESTEATOMA EXCISION     13 and 22 yrs  . FRACTURE SURGERY    . [place]  . LESION DESTRUCTION N/A 12/03/2015   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;  Surgeon: [name], DO;  Location: [facility];  Service: Plastics;  Laterality: N/A;  . MASTECTOMY W/ SENTINEL NODE BIOPSY Bilateral 07/25/2019   Procedure: BILATERAL MASTECTOMY WITH RIGHT SENTINEL LYMPH NODE BIOPSY, LEFT RISK REDUCING MASTECTOMY;  Surgeon: [name], MD;  Location: [facility];  Service: General;  Laterality: Bilateral;  PEC BLOCK  . TONSILLECTOMY     15 yrs    Social History   Tobacco Use  . Smoking status: Former Research scientist (life sciences)  . Smokeless tobacco: Never Used  Substance Use Topics  . Alcohol use: No    Family History  Problem Relation Age of Onset  . Heart disease Mother 82       Heart attack  . Colon polyps Mother   . Melanoma Mother   . Alcohol abuse Father   . Diabetes Father   . Colon polyps Sister   . Colon polyps Brother   . Colon polyps Son   . Brain cancer Maternal Aunt        pituitary cancer over 50  . Lung cancer Maternal Uncle   . Colon polyps Sister   . Melanoma Maternal Aunt   . Ovarian  cancer Maternal Aunt   . Breast cancer Cousin        maternal first cousin  . Kidney cancer Cousin        mat first cousin  . Colon cancer Cousin        mat first cousin    Review of  Systems  Constitutional: Negative for chills and fever.  Respiratory: Negative for cough and shortness of breath.   Cardiovascular: Positive for leg swelling. Negative for chest pain and palpitations.  Gastrointestinal: Negative for abdominal pain, nausea and vomiting.     OBJECTIVE:  Today's Vitals   09/14/19 1533  BP: (!) 167/97  Pulse: 93  Temp: 98 F (36.7 C)  SpO2: 95%  Weight: 194 lb (88 kg)  Height: 5\' 7"  (1.702 m)   Body mass index is 30.38 kg/m.  BP Readings from Last 3 Encounters:  09/14/19 (!) 167/97  08/08/19 (!) 163/89  07/26/19 107/69   Physical Exam Vitals and nursing note reviewed.  Constitutional:      Appearance: She is well-developed.  HENT:     Head: Normocephalic and atraumatic.     Mouth/Throat:     Pharynx: No oropharyngeal exudate.  Eyes:     General: No scleral icterus.    Conjunctiva/sclera: Conjunctivae normal.     Pupils: Pupils are equal, round, and reactive to light.  Cardiovascular:     Rate and Rhythm: Normal rate and regular rhythm.     Heart sounds: Normal heart sounds. No murmur. No friction rub. No gallop.   Pulmonary:     Effort: Pulmonary effort is normal.     Breath sounds: Normal breath sounds. No wheezing, rhonchi or rales.  Musculoskeletal:     Cervical back: Neck supple.     Right lower leg: Edema present.     Left lower leg: Edema present.  Lymphadenopathy:     Cervical: No cervical adenopathy.  Skin:    General: Skin is warm and dry.  Neurological:     Mental Status: She is alert and oriented to person, place, and time.     No results found for this or any previous visit (from the past 24 hour(s)).  No results found.   ASSESSMENT and PLAN  1. Essential hypertension, benign Not controlled. Changing HCTZ to  chlorthalidone given increased edema, adding losartan (had cough with lisinopril) discussed high K foods. Recheck labs at next OV  2. Depression with anxiety Well controlled on lexapro and buspar. Does have situational stressors related to work.  However having possible side effects to lexapro. Changing to celexa, reviewed r/se/  3. Ductal carcinoma in situ (DCIS) of right breast S/p bilateral mastectomy. In good spirits. No further treatment needed per last med onc note other than annual visits with breast surgeon, Dr Brantley Stage. No BP nor blood draws from RIGHT arm.   Other orders - chlorthalidone (HYGROTON) 25 MG tablet; Take 1 tablet (25 mg total) by mouth daily. - losartan (COZAAR) 50 MG tablet; Take 1 tablet (50 mg total) by mouth daily. - citalopram (CELEXA) 40 MG tablet; Take 1 tablet (40 mg total) by mouth daily.  Return in about 2 weeks (around 09/28/2019).    Rutherford Guys, MD Primary [facility] at [facility] [place], [place] [address] Ph.  [phone] Fax [phone]

## 2019-09-14 NOTE — Patient Instructions (Signed)
° ° ° °  If you have lab work done today you will be contacted with your lab results within the next 2 weeks.  If you have not heard from us then please contact us. The fastest way to get your results is to register for My Chart. ° ° °IF you received an x-ray today, you will receive an invoice from [facility]. Please contact [facility] at [phone] with questions or concerns regarding your invoice.  ° °IF you received labwork today, you will receive an invoice from [facility]. Please contact [facility] at [phone] with questions or concerns regarding your invoice.  ° °Our billing staff will not be able to assist you with questions regarding bills from these companies. ° °You will be contacted with the lab results as soon as they are available. The fastest way to get your results is to activate your My Chart account. Instructions are located on the last page of this paperwork. If you have not heard from us regarding the results in 2 weeks, please contact this office. °  ° ° ° °

## 2019-09-16 ENCOUNTER — Encounter: Payer: Self-pay | Admitting: Family Medicine

## 2019-09-21 ENCOUNTER — Other Ambulatory Visit: Payer: Self-pay

## 2019-09-21 ENCOUNTER — Ambulatory Visit: Payer: BC Managed Care – PPO | Attending: Hematology and Oncology | Admitting: Physical Therapy

## 2019-09-21 DIAGNOSIS — M25512 Pain in left shoulder: Secondary | ICD-10-CM | POA: Insufficient documentation

## 2019-09-21 DIAGNOSIS — M25511 Pain in right shoulder: Secondary | ICD-10-CM

## 2019-09-21 DIAGNOSIS — M25611 Stiffness of right shoulder, not elsewhere classified: Secondary | ICD-10-CM | POA: Insufficient documentation

## 2019-09-21 DIAGNOSIS — R600 Localized edema: Secondary | ICD-10-CM

## 2019-09-21 DIAGNOSIS — D0511 Intraductal carcinoma in situ of right breast: Secondary | ICD-10-CM | POA: Diagnosis present

## 2019-09-21 DIAGNOSIS — M25612 Stiffness of left shoulder, not elsewhere classified: Secondary | ICD-10-CM | POA: Insufficient documentation

## 2019-09-21 NOTE — Therapy (Signed)
[place], [place], [address] Phone: [phone]   Fax:  [phone]  Physical Therapy Treatment  Patient Details  Name: Catherine Cole MRN: [id number] Date of Birth: 05-28-1955 Referring Provider (PT): Nicholas Lose MD   Encounter Date: 09/21/2019  PT End of Session - 09/21/19 1209    Visit Number  3    Number of Visits  7    Date for PT Re-Evaluation  10/19/19    PT Start Time  1100    PT Stop Time  1158    PT Time Calculation (min)  58 min    Activity Tolerance  Patient tolerated treatment well    Behavior During Therapy  [facility] for tasks assessed/performed       Past Medical History:  Diagnosis Date  . Adult ADHD   . Anemia   . Anxiety   . Complication of anesthesia   . Depression   . Family history of breast cancer   . Family history of colon cancer   . Family history of kidney cancer   . Family history of melanoma   . Family history of ovarian cancer   . Hypertension   . IBS (irritable bowel syndrome)   . PONV (postoperative nausea and vomiting)     Past Surgical History:  Procedure Laterality Date  . APPENDECTOMY     12 yrs  . CHOLECYSTECTOMY  1995  . CHOLESTEATOMA EXCISION     13 and 22 yrs  . FRACTURE SURGERY    . [place]  . LESION DESTRUCTION N/A 12/03/2015   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;  Surgeon: [name], DO;  Location: [facility];  Service: Plastics;  Laterality: N/A;  . MASTECTOMY W/ SENTINEL NODE BIOPSY Bilateral 07/25/2019   Procedure: BILATERAL MASTECTOMY WITH RIGHT SENTINEL LYMPH NODE BIOPSY, LEFT RISK REDUCING MASTECTOMY;  Surgeon: [name], MD;  Location: [facility];  Service: General;  Laterality: Bilateral;  PEC BLOCK  . TONSILLECTOMY     15 yrs    There were no vitals filed for this visit.                     [facility] Adult PT  Treatment/Exercise - 09/21/19 0001      Exercises   Exercises  Shoulder      Shoulder Exercises: Supine   Horizontal ABduction  Strengthening;Both;10 reps;Theraband   pt returned therapist demo   Theraband Level (Shoulder Horizontal ABduction)  Level 1 (Yellow)    External Rotation  Strengthening;Both;10 reps;Theraband   pt returned therapist demo   Theraband Level (Shoulder External Rotation)  Level 1 (Yellow)    Flexion  Strengthening;Both;10 reps;Theraband   narrow and wide grip, pt returned therapist demo   Theraband Level (Shoulder Flexion)  Level 1 (Yellow)    ABduction  Strengthening;Both;10 reps;Theraband   pt returned therapist demo   Theraband Level (Shoulder ABduction)  Level 1 (Yellow)    Diagonals  Strengthening;Both;10 reps;Theraband   pt returned therapist demo   Theraband Level (Shoulder Diagonals)  Level 1 (Yellow)      Manual Therapy   Manual Therapy  Manual Lymphatic Drainage (MLD);Edema management    Edema Management  made foam chip pack for pt to wear on left lateral chest in compression shirt or binder to help decrease swelling    Manual Lymphatic Drainage (MLD)  In supine: 5 diaphragmatic breaths, short neck, left inguinal nodes and  establishment of axillo inguinal pathway focusing on left lateral trunk moving fluid towards pathway. Issued handout to pt for how to perform self MLD to lateral trunk and UEs at home.               PT Short Term Goals - 09/21/19 1113      PT SHORT TERM GOAL #1   Title  Pt will be independent with HEP within 1 week in order to demonstrate autonomy of care.    Baseline  pt currently does not have an HEP, 09/21/19- pt is indpendent in current HEP    Time  1    Period  Weeks    Status  Achieved        PT Long Term Goals - 09/21/19 1109      PT LONG TERM GOAL #1   Title  Pt will demonstrate 120 degrees or greater of L shoulder flexion/abduction and R shoulder abduction within 3 weeks in order to return to functional  ROM.    Baseline  R shoulder flexion: 145, abduction: 100, L shoulder flexion: 115 abduction: 102, 09/21/19- R shoulder flexion- 148, R abduction- 154, L flexion- 137 L abduction- 142    Time  3    Period  Weeks    Status  Achieved      PT LONG TERM GOAL #2   Title  Pt will report 50% improvement in pain and functional mobility within 3 weeks in order to demonstrate a subjective improvement in function of the bil UE.    Baseline  1/10 pain and pt is not performing all her regular activities at home including house work., 09/21/19- 80% improvement    Time  3    Period  Weeks    Status  Achieved      PT LONG TERM GOAL #3   Title  Pt will demonstrate improve upright posture within 3 weeks as demonstrated by decreased anterior placement of the R shoulder.    Baseline  significant anterior rotation as demonstrated by caving on the R due to tightness, 09/21/19- improved    Time  3    Period  Weeks    Status  Achieved      PT LONG TERM GOAL #4   Title  Pt will demonstrate 160 degrees of bilateral shoulder flexion and abduction to allow pt to reach up to the top shelf.    Baseline  09/21/19- R shoulder flexion- 148, R abduction- 154, L flexion- 137 L abduction- 142    Time  4    Period  Weeks    Status  New    Target Date  10/19/19      PT LONG TERM GOAL #5   Title  Pt will be independent in a home exercise program for continued strengthening and stretching.    Time  4    Period  Weeks    Status  New    Target Date  10/19/19            Plan - 09/21/19 1210    Clinical Impression Statement  Assessed pt's progress towards goals in therapy. She has met initial goals set for therapy but still has limited bilateral shoulder ROM. Added a new goal this session to address this. Also issued handout to pt for self MLD to chest, trunk and UEs. Created chip pack for pt to wear in her compression shirt or binder to help decrease left lateral trunk swelling. Pt still feels weak in her UEs  and has  trouble with reaching overhead. Added new strengthening HEP goal and began scapular strengthening exercises today. Pt would benefit from continued skilled PT services to increase bilateral shoulder ROM, decrease discomfort in axilla and decrease swelling.    Comorbidities  Bil masectomy with 2 lymph node removal on the R    PT Frequency  1x / week    PT Duration  3 weeks    PT Treatment/Interventions  Functional mobility training;Therapeutic activities;Neuromuscular re-education;Manual techniques;Manual lymph drainage;Taping;Passive range of motion;Scar mobilization;Therapeutic exercise;ADLs/Self Care Home Management;Compression bandaging;Vasopneumatic Device    PT Next Visit Plan  MLD lateral trunk, continue with exercises, P/ROM and AA/ROM as well as stretching, myofascial release and STM as needed.    PT Home Exercise Plan  post-op exercises,supine scap exercises    Consulted and Agree with Plan of Care  Patient       Patient will benefit from skilled therapeutic intervention in order to improve the following deficits and impairments:  Increased edema, Postural dysfunction, Decreased range of motion, Pain  Visit Diagnosis: Localized edema  Stiffness of right shoulder, not elsewhere classified  Stiffness of left shoulder, not elsewhere classified  Acute pain of right shoulder  Acute pain of left shoulder  Ductal carcinoma in situ (DCIS) of right breast     Problem List Patient Active Problem List   Diagnosis Date Noted  . Genetic testing 08/03/2019  . Breast cancer, right ([place]) 07/25/2019  . 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 Family history of ovarian cancer   . Family history of breast cancer   . Family history of colon cancer   . Family history of kidney cancer   . Family history of melanoma   . Vitamin D deficiency 12/12/2018  . Essential hypertension, benign 10/15/2015  . Kidney stone 05/02/2015  . Palpitations 02/08/2015  . Atypical chest  pain 02/08/2015  . Exertional dyspnea 02/08/2015  . Lower extremity edema 02/08/2015  . Mixed dyslipidemia 03/26/2014  . 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/03/2013  . Depression with anxiety 04/26/2012  . ADHD (attention deficit hyperactivity disorder) 04/26/2012    Allyson Sabal [facility] 09/21/2019, 12:14 PM  [place], [place], [address] Phone: [phone]   Fax:  [phone]  Name: Catherine Cole MRN: [id number] Date of Birth: [birth year]  Manus Gunning, PT 09/21/19 12:14 PM

## 2019-09-21 NOTE — Patient Instructions (Addendum)
Deep Effective Breath   Standing, sitting, or laying down, place both hands on the belly. Take a deep breath IN, expanding the belly; then breath OUT, contracting the belly. Repeat __5__ times. Do __2-3__ sessions per day and before your self massage.  http://gt2.exer.us/866   Copyright  VHI. All rights reserved.  Axilla to Inguinal Nodes - Sweep   On involved side, make 5 circles at groin at panty line, then pump _5__ times from armpit along side of trunk to outer hip, making your other pathway. Do __1_ time per day.  Draw an imaginary diagonal line from upper outer chest through the nipple area toward lower inner breast.  Direct fluid upward and inward from this line toward the pathway across your upper chest .  Do this in three rows to treat all of the upper inner breast tissue, and do each row 3-4x.      Direct fluid to treat all of lower outer chest tissue downward and outward toward pathway that is aimed at the same groin.   Copyright  VHI. All rights reserved.  Arm Posterior: Elbow to Shoulder - Sweep   Pump _5__ times from back of elbow to top of shoulder. Then inner to outer upper arm _5_ times, then outer arm again _5_ times. Then back to the pathways _2-3_ times. Do _1__ time per day.  Copyright  VHI. All rights reserved.  ARM: Volar Wrist to Elbow - Sweep   Pump or stationary circles _5__ times from wrist to elbow making sure to do both sides of the forearm. Then retrace your steps to the outer arm, and the pathways _2-3_ times each. Do _1__ time per day.  Copyright  VHI. All rights reserved.  ARM: Dorsum of Hand to Shoulder - Sweep   Pump or stationary circles _5__ times on back of hand including knuckle spaces and individual fingers if needed working up towards the wrist, then retrace all your steps working back up the forearm, doing both sides; upper outer arm and back to your pathways _2-3_ times each. Then do 5 circles again at uninvolved armpit and involved  groin where you started! Good job!! Do __1_ time per day.  Copyright  VHI. All rights reserved.   Over Head Pull: Narrow and Wide Grip   Cancer Rehab [phone]   On back, knees bent, feet flat, band across thighs, elbows straight but relaxed. Pull hands apart (start). Keeping elbows straight, bring arms up and over head, hands toward floor. Keep pull steady on band. Hold momentarily. Return slowly, keeping pull steady, back to start. Then do same with a wider grip on the band (past shoulder width) Repeat _10__ times. Band color __yellow____   Side Pull: Double Arm   On back, knees bent, feet flat. Arms perpendicular to body, shoulder level, elbows straight but relaxed. Pull arms out to sides, elbows straight. Resistance band comes across collarbones, hands toward floor. Hold momentarily. Slowly return to starting position. Repeat _10__ times. Band color _yellow____   Sword   On back, knees bent, feet flat, left hand on left hip, right hand above left. Pull right arm DIAGONALLY (hip to shoulder) across chest. Bring right arm along head toward floor. Hold momentarily. Slowly return to starting position. Repeat _10__ times. Do with left arm. Band color _yellow_____   Shoulder Rotation: Double Arm   On back, knees bent, feet flat, elbows tucked at sides, bent 90, hands palms up. Pull hands apart and down toward floor, keeping elbows near sides. Hold  momentarily. Slowly return to starting position. Repeat _10__ times. Band color __yellow____

## 2019-09-28 ENCOUNTER — Ambulatory Visit: Payer: BC Managed Care – PPO | Admitting: Physical Therapy

## 2019-09-28 ENCOUNTER — Ambulatory Visit: Payer: BC Managed Care – PPO | Admitting: Family Medicine

## 2019-09-28 ENCOUNTER — Encounter: Payer: Self-pay | Admitting: Physical Therapy

## 2019-09-28 ENCOUNTER — Other Ambulatory Visit: Payer: Self-pay

## 2019-09-28 DIAGNOSIS — R600 Localized edema: Secondary | ICD-10-CM | POA: Diagnosis not present

## 2019-09-28 DIAGNOSIS — M25512 Pain in left shoulder: Secondary | ICD-10-CM

## 2019-09-28 DIAGNOSIS — M25612 Stiffness of left shoulder, not elsewhere classified: Secondary | ICD-10-CM

## 2019-09-28 NOTE — Therapy (Signed)
[place], [place], [address] Phone: [phone]   Fax:  [phone]  Physical Therapy Treatment  Patient Details  Name: Catherine Cole MRN: [id number] Date of Birth: [birth year] Referring Provider (PT): Nicholas Lose MD   Encounter Date: 09/28/2019   PT End of Session - 09/28/19 1126    Visit Number 4    Number of Visits 7    Date for PT Re-Evaluation 10/19/19    PT Start Time 1026    PT Stop Time 1126    PT Time Calculation (min) 60 min    Activity Tolerance Patient tolerated treatment well    Behavior During Therapy [facility] for tasks assessed/performed           Past Medical History:  Diagnosis Date  . Adult ADHD   . Anemia   . Anxiety   . Complication of anesthesia   . Depression   . Family history of breast cancer   . Family history of colon cancer   . Family history of kidney cancer   . Family history of melanoma   . Family history of ovarian cancer   . Hypertension   . IBS (irritable bowel syndrome)   . PONV (postoperative nausea and vomiting)     Past Surgical History:  Procedure Laterality Date  . APPENDECTOMY     12 yrs  . CHOLECYSTECTOMY  1995  . CHOLESTEATOMA EXCISION     13 and 22 yrs  . FRACTURE SURGERY    . [place]  . LESION DESTRUCTION N/A 12/03/2015   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;  Surgeon: [name], DO;  Location: [place];  Service: Plastics;  Laterality: N/A;  . MASTECTOMY W/ SENTINEL NODE BIOPSY Bilateral 07/25/2019   Procedure: BILATERAL MASTECTOMY WITH RIGHT SENTINEL LYMPH NODE BIOPSY, LEFT RISK REDUCING MASTECTOMY;  Surgeon: [name], MD;  Location: [place];  Service: General;  Laterality: Bilateral;  PEC BLOCK  . TONSILLECTOMY     15 yrs    There were no vitals filed for this visit.   Subjective Assessment - 09/28/19 1027    Subjective The chip  pack did fine. I have not exercised like I should this week. I have anxiety and depression and I felt down for a few days.    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 0/2 lymph nodes positive    Patient Stated Goals I want to get rid of the fluid and make sure that I am not doing any thing detrimental to my recovery especially in the R arm.    Currently in Pain? No/denies    Pain Score 0-No pain                             [facility] Adult PT Treatment/Exercise - 09/28/19 0001      Shoulder Exercises: Supine   Horizontal ABduction Strengthening;Both;10 reps;Theraband   pt returned therapist demo   Theraband Level (Shoulder Horizontal ABduction) Level 2 (Red)    Horizontal ABduction Limitations v/c to squeeze shoulders together    External Rotation Strengthening;Both;10 reps;Theraband   pt returned therapist demo   Theraband Level (Shoulder External Rotation) Level 2 (Red)    External Rotation Limitations v/c palms up, keep elbows by sides    Flexion Strengthening;Both;10 reps;Theraband   narrow and wide grip, pt returned therapist demo  Theraband Level (Shoulder Flexion) Level 2 (Red)    Flexion Limitations v/c to keep scapular muscles engaged to avoid impingement pain    Diagonals Strengthening;Both;10 reps;Theraband   pt returned therapist demo   Theraband Level (Shoulder Diagonals) Level 2 (Red)      Manual Therapy   Manual Therapy Manual Lymphatic Drainage (MLD);Passive ROM    Manual Lymphatic Drainage (MLD) In supine: 5 diaphragmatic breaths, short neck, left inguinal nodes and establishment of axillo inguinal pathway focusing on left lateral trunk moving fluid towards pathway, then to right sidelying to focus futher on left lateral trunk then back to supine retracing all steps    Passive ROM to left shoulder with prolonged holds in to flexion and abduction, used some gentle traction to decrease pain in upper shoulder but  pt still had discomfort so discontinued                    PT Short Term Goals - 09/21/19 1113      PT SHORT TERM GOAL #1   Title Pt will be independent with HEP within 1 week in order to demonstrate autonomy of care.    Baseline pt currently does not have an HEP, 09/21/19- pt is indpendent in current HEP    Time 1    Period Weeks    Status Achieved             PT Long Term Goals - 09/21/19 1109      PT LONG TERM GOAL #1   Title Pt will demonstrate 120 degrees or greater of L shoulder flexion/abduction and R shoulder abduction within 3 weeks in order to return to functional ROM.    Baseline R shoulder flexion: 145, abduction: 100, L shoulder flexion: 115 abduction: 102, 09/21/19- R shoulder flexion- 148, R abduction- 154, L flexion- 137 L abduction- 142    Time 3    Period Weeks    Status Achieved      PT LONG TERM GOAL #2   Title Pt will report 50% improvement in pain and functional mobility within 3 weeks in order to demonstrate a subjective improvement in function of the bil UE.    Baseline 1/10 pain and pt is not performing all her regular activities at home including house work., 09/21/19- 80% improvement    Time 3    Period Weeks    Status Achieved      PT LONG TERM GOAL #3   Title Pt will demonstrate improve upright posture within 3 weeks as demonstrated by decreased anterior placement of the R shoulder.    Baseline significant anterior rotation as demonstrated by caving on the R due to tightness, 09/21/19- improved    Time 3    Period Weeks    Status Achieved      PT LONG TERM GOAL #4   Title Pt will demonstrate 160 degrees of bilateral shoulder flexion and abduction to allow pt to reach up to the top shelf.    Baseline 09/21/19- R shoulder flexion- 148, R abduction- 154, L flexion- 137 L abduction- 142    Time 4    Period Weeks    Status New    Target Date 10/19/19      PT LONG TERM GOAL #5   Title Pt will be independent in a home exercise program for  continued strengthening and stretching.    Time 4    Period Weeks    Status New    Target Date 10/19/19  Plan - 09/28/19 1123    Clinical Impression Statement Upgraded resistance with supine scapular exercises and pt tolerated this well. Provided verbal cues for correct form. Continued MLD to left lateral trunk to decrease fullness in this area. Pt would benefit from continued scapular strengtheing to help decrease left shoulder pain at end range.    PT Frequency 1x / week    PT Duration 3 weeks    PT Treatment/Interventions Functional mobility training;Therapeutic activities;Neuromuscular re-education;Manual techniques;Manual lymph drainage;Taping;Passive range of motion;Scar mobilization;Therapeutic exercise;ADLs/Self Care Home Management;Compression bandaging;Vasopneumatic Device    PT Next Visit Plan remeasure ROM, MLD lateral trunk, continue with exercises, P/ROM and AA/ROM as well as stretching, myofascial release and STM as needed.    PT Home Exercise Plan post-op exercises,supine scap exercises    Consulted and Agree with Plan of Care Patient           Patient will benefit from skilled therapeutic intervention in order to improve the following deficits and impairments:  Increased edema, Postural dysfunction, Decreased range of motion, Pain  Visit Diagnosis: Localized edema  Stiffness of left shoulder, not elsewhere classified  Acute pain of left shoulder     Problem List Patient Active Problem List   Diagnosis Date Noted  . Genetic testing 08/03/2019  . Breast cancer, right ([place]) 07/25/2019  . 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 Family history of ovarian cancer   . Family history of breast cancer   . Family history of colon cancer   . Family history of kidney cancer   . Family history of melanoma   . Vitamin D deficiency 12/12/2018  . Essential hypertension, benign 10/15/2015  . Kidney stone 05/02/2015  .  Palpitations 02/08/2015  . Atypical chest pain 02/08/2015  . Exertional dyspnea 02/08/2015  . Lower extremity edema 02/08/2015  . Mixed dyslipidemia 03/26/2014  . 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/03/2013  . Depression with anxiety 04/26/2012  . ADHD (attention deficit hyperactivity disorder) 04/26/2012    Allyson Sabal [facility] 09/28/2019, 11:27 AM  [place], [place], [address] Phone: [phone]   Fax:  [phone]  Name: Catherine Cole MRN: [id number] Date of Birth: 1956/01/19  Manus Gunning, PT 09/28/19 11:28 AM

## 2019-10-01 ENCOUNTER — Encounter: Payer: Self-pay | Admitting: Family Medicine

## 2019-10-01 ENCOUNTER — Other Ambulatory Visit: Payer: Self-pay

## 2019-10-01 ENCOUNTER — Ambulatory Visit: Payer: BC Managed Care – PPO

## 2019-10-01 ENCOUNTER — Other Ambulatory Visit: Payer: Self-pay | Admitting: Family Medicine

## 2019-10-01 DIAGNOSIS — E876 Hypokalemia: Secondary | ICD-10-CM

## 2019-10-01 DIAGNOSIS — E782 Mixed hyperlipidemia: Secondary | ICD-10-CM

## 2019-10-01 DIAGNOSIS — E559 Vitamin D deficiency, unspecified: Secondary | ICD-10-CM

## 2019-10-01 DIAGNOSIS — I10 Essential (primary) hypertension: Secondary | ICD-10-CM

## 2019-10-02 ENCOUNTER — Ambulatory Visit: Payer: BC Managed Care – PPO | Admitting: Physical Therapy

## 2019-10-02 ENCOUNTER — Ambulatory Visit: Payer: BC Managed Care – PPO

## 2019-10-02 LAB — COMPREHENSIVE METABOLIC PANEL
ALT: 19 IU/L (ref 0–32)
AST: 16 IU/L (ref 0–40)
Albumin/Globulin Ratio: 1.9 (ref 1.2–2.2)
Albumin: 4.5 g/dL (ref 3.8–4.8)
Alkaline Phosphatase: 92 IU/L (ref 48–121)
BUN/Creatinine Ratio: 24 (ref 12–28)
BUN: 22 mg/dL (ref 8–27)
Bilirubin Total: 0.3 mg/dL (ref 0.0–1.2)
CO2: 28 mmol/L (ref 20–29)
Calcium: 10 mg/dL (ref 8.7–10.3)
Chloride: 97 mmol/L (ref 96–106)
Creatinine, Ser: 0.91 mg/dL (ref 0.57–1.00)
GFR calc Af Amer: 78 mL/min/{1.73_m2} (ref >59)
GFR calc non Af Amer: 67 mL/min/{1.73_m2} (ref >59)
Globulin, Total: 2.4 g/dL (ref 1.5–4.5)
Glucose: 102 mg/dL — ABNORMAL HIGH (ref 65–99)
Potassium: 3.8 mmol/L (ref 3.5–5.2)
Sodium: 139 mmol/L (ref 134–144)
Total Protein: 6.9 g/dL (ref 6.0–8.5)

## 2019-10-02 LAB — LIPID PANEL
Chol/HDL Ratio: 4.7 ratio — ABNORMAL HIGH (ref 0.0–4.4)
Cholesterol, Total: 258 mg/dL — ABNORMAL HIGH (ref 100–199)
HDL: 55 mg/dL (ref >39)
LDL Chol Calc (NIH): 160 mg/dL — ABNORMAL HIGH (ref 0–99)
Triglycerides: 234 mg/dL — ABNORMAL HIGH (ref 0–149)
VLDL Cholesterol Cal: 43 mg/dL — ABNORMAL HIGH (ref 5–40)

## 2019-10-02 LAB — VITAMIN D 25 HYDROXY (VIT D DEFICIENCY, FRACTURES): Vit D, 25-Hydroxy: 21.5 ng/mL — ABNORMAL LOW (ref 30.0–100.0)

## 2019-10-04 ENCOUNTER — Telehealth (INDEPENDENT_AMBULATORY_CARE_PROVIDER_SITE_OTHER): Payer: BC Managed Care – PPO | Admitting: Family Medicine

## 2019-10-04 ENCOUNTER — Other Ambulatory Visit: Payer: Self-pay

## 2019-10-04 ENCOUNTER — Encounter: Payer: Self-pay | Admitting: Family Medicine

## 2019-10-04 DIAGNOSIS — I10 Essential (primary) hypertension: Secondary | ICD-10-CM | POA: Diagnosis not present

## 2019-10-04 DIAGNOSIS — E559 Vitamin D deficiency, unspecified: Secondary | ICD-10-CM

## 2019-10-04 DIAGNOSIS — E782 Mixed hyperlipidemia: Secondary | ICD-10-CM

## 2019-10-04 DIAGNOSIS — F900 Attention-deficit hyperactivity disorder, predominantly inattentive type: Secondary | ICD-10-CM | POA: Diagnosis not present

## 2019-10-04 DIAGNOSIS — F418 Other specified anxiety disorders: Secondary | ICD-10-CM

## 2019-10-04 MED ORDER — LISDEXAMFETAMINE DIMESYLATE 50 MG PO CAPS: 50.0000 mg | ORAL_CAPSULE | Freq: Every day | ORAL | 0 refills | Status: DC

## 2019-10-04 MED ORDER — BUSPIRONE HCL 10 MG PO TABS: ORAL_TABLET | ORAL | 1 refills | Status: DC

## 2019-10-04 NOTE — Progress Notes (Signed)
Virtual Visit Note  I connected with patient on 10/04/19 at 530pm by phone due to technical difficulties and verified that I am speaking with the correct person using two identifiers. Catherine Cole is currently located at home and patient is currently with them during visit. The provider, Rutherford Guys, MD is located in their office at time of visit.  I discussed the limitations, risks, security and privacy concerns of performing an evaluation and management service by telephone and the availability of in person appointments. I also discussed with the patient that there may be a patient responsible charge related to this service. The patient expressed understanding and agreed to proceed.   I provided 13 minutes of non-face-to-face time during this encounter.  Chief Complaint  Patient presents with  . Hypertension    145/83, 95, losartan, chlorthalidone  . Depression    celexa - no issues reported/ questions on buspiraone   . Medication Refill    vyvanse    HPI   PMH: ADD, HTN, depression with anxiety, vitamin D deficiencyand urinary incontinence, right breast cancer   Last OV 2 weeks ago - changed HCTZ to chlorthalidone, added losartan Changed lexapro to celexa due to side effects  She is tolerating new BP meds well Her BP is coming down nicely Today 145/83 Her swelling is also getting better Cont to work with PT for lymphedema from recent masectomy  She reports that her diet took the back burner since march as her sister was caring/cooking for her She has also not been taking vitamin D  Needs refills of vyvanse now that she is going back to work Her ADD is well controlled She tolerates well, has been on this regime for over a year She denies any side effects  Dry eyes resolved with change of lexapro to celexa Takes buspar prn at bedtime  The 10-year ASCVD risk score Mikey Bussing DC Jr., et al., 2013) is: 11.8%  Lab Results  Component Value Date   CHOL 258 (H)  10/01/2019   HDL 55 10/01/2019   LDLCALC 160 (H) 10/01/2019   LDLDIRECT 160 (H) 06/28/2014   TRIG 234 (H) 10/01/2019   CHOLHDL 4.7 (H) 10/01/2019    Lab Results  Component Value Date   CREATININE 0.91 10/01/2019   BUN 22 10/01/2019   NA 139 10/01/2019   K 3.8 10/01/2019   CL 97 10/01/2019   CO2 28 10/01/2019    Allergies  Allergen Reactions  . Latex Hives  . Demerol Nausea And Vomiting  . Codeine Nausea And Vomiting  . Erythromycin Itching and Rash  . Neosporin [Neomycin-Polymyxin B Gu] Itching and Rash    Prior to Admission medications   Medication Sig Start Date End Date Taking? Authorizing Provider  chlorthalidone (HYGROTON) 25 MG tablet Take 1 tablet (25 mg total) by mouth daily. 09/14/19  Yes Rutherford Guys, MD  citalopram (CELEXA) 40 MG tablet Take 1 tablet (40 mg total) by mouth daily. 09/14/19  Yes Rutherford Guys, MD  ibuprofen (ADVIL) 800 MG tablet Take 1 tablet (800 mg total) by mouth every 8 (eight) hours as needed. 07/25/19  Yes Cornett, [name], MD  Incontinence Supply Disposable (DEPEND EASY FIT UNDERGARMENTS) MISC Use daily as directed. Change several times daily as needed. 06/29/18  Yes Rutherford Guys, MD  lisdexamfetamine (VYVANSE) 50 MG capsule Take 1 capsule (50 mg total) by mouth daily. Ok to fill 30 days after signature 05/17/19  Yes Rutherford Guys, MD  lisdexamfetamine (VYVANSE) 50 MG  capsule Take 1 capsule (50 mg total) by mouth daily. Ok to fill 60 days after signature 05/17/19  Yes Rutherford Guys, MD  losartan (COZAAR) 50 MG tablet Take 1 tablet (50 mg total) by mouth daily. 09/14/19  Yes Rutherford Guys, MD  Multiple Vitamin (MULTIVITAMIN WITH MINERALS) TABS tablet Take 1 tablet by mouth daily.   Yes [provider]  oxyCODONE (OXY IR/ROXICODONE) 5 MG immediate release tablet Take 1 tablet (5 mg total) by mouth every 6 (six) hours as needed for severe pain. 07/25/19  Yes Cornett, [name], MD  valACYclovir (VALTREX) 500 MG tablet Take 2 tablets  by mouth at acute onset for outbreak, followed by 1 tablet once a day x 5 days 12/12/18  Yes Rutherford Guys, MD  Vitamin D, Ergocalciferol, (DRISDOL) 1.25 MG (50000 UNIT) CAPS capsule TAKE 1 CAPSULE BY MOUTH EVERY 7 DAYS 07/04/19  Yes Rutherford Guys, MD  busPIRone (BUSPAR) 10 MG tablet TAKE 1 TABLET BY MOUTH EVERYDAY AT BEDTIME Patient not taking: Reported on 10/04/2019 03/19/19   Rutherford Guys, MD  lisdexamfetamine (VYVANSE) 50 MG capsule Take 1 capsule (50 mg total) by mouth daily before breakfast. 05/17/19 06/16/19  Rutherford Guys, MD    Past Medical History:  Diagnosis Date  . Adult ADHD   . Anemia   . Anxiety   . Complication of anesthesia   . Depression   . Family history of breast cancer   . Family history of colon cancer   . Family history of kidney cancer   . Family history of melanoma   . Family history of ovarian cancer   . Hypertension   . IBS (irritable bowel syndrome)   . PONV (postoperative nausea and vomiting)     Past Surgical History:  Procedure Laterality Date  . APPENDECTOMY     12 yrs  . CHOLECYSTECTOMY  1995  . CHOLESTEATOMA EXCISION     13 and 22 yrs  . FRACTURE SURGERY    . [place]  . LESION DESTRUCTION N/A 12/03/2015   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;  Surgeon: [name], DO;  Location: [place];  Service: Plastics;  Laterality: N/A;  . MASTECTOMY W/ SENTINEL NODE BIOPSY Bilateral 07/25/2019   Procedure: BILATERAL MASTECTOMY WITH RIGHT SENTINEL LYMPH NODE BIOPSY, LEFT RISK REDUCING MASTECTOMY;  Surgeon: [name], MD;  Location: [place];  Service: General;  Laterality: Bilateral;  PEC BLOCK  . TONSILLECTOMY     15 yrs    Social History   Tobacco Use  . Smoking status: Former Research scientist (life sciences)  . Smokeless tobacco: Never Used  Substance Use Topics  . Alcohol use: No    Family History  Problem Relation Age of Onset  . Heart disease Mother 31        Heart attack  . Colon polyps Mother   . Melanoma Mother   . Alcohol abuse Father   . Diabetes Father   . Colon polyps Sister   . Colon polyps Brother   . Colon polyps Son   . Brain cancer Maternal Aunt        pituitary cancer over 50  . Lung cancer Maternal Uncle   . Colon polyps Sister   . Melanoma Maternal Aunt   . Ovarian cancer Maternal Aunt   . Breast cancer Cousin        maternal first cousin  . Kidney cancer Cousin  mat first cousin  . Colon cancer Cousin        mat first cousin    Review of Systems  Constitutional: Negative for chills and fever.  Respiratory: Negative for cough and shortness of breath.   Cardiovascular: Negative for chest pain, palpitations and leg swelling.  Gastrointestinal: Negative for abdominal pain, nausea and vomiting.    Objective  Vitals as reported by the patient: per above   ASSESSMENT and PLAN  1. Essential hypertension, benign Improved. Continue current regime. Restart LFM  2. Attention deficit hyperactivity disorder (ADHD), predominantly inattentive type Controlled. Continue current regime. pmp reviewed. - lisdexamfetamine (VYVANSE) 50 MG capsule; Take 1 capsule (50 mg total) by mouth daily. Ok to fill 30 days after signature - lisdexamfetamine (VYVANSE) 50 MG capsule; Take 1 capsule (50 mg total) by mouth daily before breakfast.  3. Vitamin D deficiency Not at goal. Restart vitamin D - VITAMIN D 25 Hydroxy (Vit-D Deficiency, Fractures); Future  4. Mixed dyslipidemia Not at goal. Restart LFM. - Comprehensive metabolic panel; Future - Lipid panel; Future  5. Depression with anxiety Controlled. Continue current regime.   Other orders - lisdexamfetamine (VYVANSE) 50 MG capsule; Take 1 capsule (50 mg total) by mouth daily. Ok to fill 60 days after signature - busPIRone (BUSPAR) 10 MG tablet; TAKE 1 TABLET BY MOUTH EVERYDAY AT BEDTIME  FOLLOW-UP: 3 months with labs prior   The above assessment and management plan  was discussed with the patient. The patient verbalized understanding of and has agreed to the management plan. Patient is aware to call the clinic if symptoms persist or worsen. Patient is aware when to return to the clinic for a follow-up visit. Patient educated on when it is appropriate to go to the emergency department.     Rutherford Guys, MD [facility] at [place], [place] [address] Ph.  [phone] Fax [phone]

## 2019-10-04 NOTE — Patient Instructions (Signed)
° ° ° °  If you have lab work done today you will be contacted with your lab results within the next 2 weeks.  If you have not heard from us then please contact us. The fastest way to get your results is to register for My Chart. ° ° °IF you received an x-ray today, you will receive an invoice from [facility]. Please contact [facility] at [phone] with questions or concerns regarding your invoice.  ° °IF you received labwork today, you will receive an invoice from [facility]. Please contact [facility] at [phone] with questions or concerns regarding your invoice.  ° °Our billing staff will not be able to assist you with questions regarding bills from these companies. ° °You will be contacted with the lab results as soon as they are available. The fastest way to get your results is to activate your My Chart account. Instructions are located on the last page of this paperwork. If you have not heard from us regarding the results in 2 weeks, please contact this office. °  ° ° ° °

## 2019-10-06 ENCOUNTER — Other Ambulatory Visit: Payer: Self-pay | Admitting: Family Medicine

## 2019-10-06 NOTE — Telephone Encounter (Signed)
Requested Prescriptions  Pending Prescriptions Disp Refills  . citalopram (CELEXA) 40 MG tablet [Pharmacy Med Name: CITALOPRAM HBR 40 MG TABLET] 90 tablet 1    Sig: TAKE 1 TABLET BY MOUTH EVERY DAY     Psychiatry:  Antidepressants - SSRI Passed - 10/06/2019 12:31 PM      Passed - Completed PHQ-2 or PHQ-9 in the last 360 days.      Passed - Valid encounter within last 6 months    Recent Outpatient Visits          2 days ago Essential hypertension, benign   [facility], [name], MD   3 weeks ago Essential hypertension, benign   [facility], [name], MD   4 months ago Attention deficit hyperactivity disorder (ADHD), predominantly inattentive type   [facility], [name], MD   6 months ago Essential hypertension, benign   [facility], [name], MD   9 months ago Essential hypertension, benign   [facility], [name], MD

## 2019-10-10 ENCOUNTER — Encounter: Payer: BC Managed Care – PPO | Admitting: Physical Therapy

## 2019-10-17 ENCOUNTER — Encounter: Payer: Self-pay | Admitting: Physical Therapy

## 2019-10-17 ENCOUNTER — Other Ambulatory Visit: Payer: Self-pay

## 2019-10-17 ENCOUNTER — Ambulatory Visit: Payer: BC Managed Care – PPO | Admitting: Physical Therapy

## 2019-10-17 DIAGNOSIS — R600 Localized edema: Secondary | ICD-10-CM

## 2019-10-17 NOTE — Therapy (Signed)
[place], [place], [address] Phone: [phone]   Fax:  [phone]  Physical Therapy Treatment  Patient Details  Name: Catherine Cole MRN: [id number] Date of Birth: October 16, 1955 Referring Provider (PT): Nicholas Lose MD   Encounter Date: 10/17/2019   PT End of Session - 10/17/19 1602    Visit Number 5    Number of Visits 7    Date for PT Re-Evaluation 10/19/19    PT Start Time 1504    PT Stop Time 1600    PT Time Calculation (min) 56 min    Activity Tolerance Patient tolerated treatment well    Behavior During Therapy [facility] for tasks assessed/performed           Past Medical History:  Diagnosis Date  . Adult ADHD   . Anemia   . Anxiety   . Complication of anesthesia   . Depression   . Family history of breast cancer   . Family history of colon cancer   . Family history of kidney cancer   . Family history of melanoma   . Family history of ovarian cancer   . Hypertension   . IBS (irritable bowel syndrome)   . PONV (postoperative nausea and vomiting)     Past Surgical History:  Procedure Laterality Date  . APPENDECTOMY     12 yrs  . CHOLECYSTECTOMY  1995  . CHOLESTEATOMA EXCISION     13 and 22 yrs  . FRACTURE SURGERY    . [place]  . LESION DESTRUCTION N/A 12/03/2015   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;  Surgeon: [name], DO;  Location: [place];  Service: Plastics;  Laterality: N/A;  . MASTECTOMY W/ SENTINEL NODE BIOPSY Bilateral 07/25/2019   Procedure: BILATERAL MASTECTOMY WITH RIGHT SENTINEL LYMPH NODE BIOPSY, LEFT RISK REDUCING MASTECTOMY;  Surgeon: [name], MD;  Location: [place];  Service: General;  Laterality: Bilateral;  PEC BLOCK  . TONSILLECTOMY     15 yrs    There were no vitals filed for this visit.   Subjective Assessment - 10/17/19 1508    Subjective My  swelling is worse today. I am going back to the doctor. He mentioned I might need a sleeve on the side that did not have lymph nodes removed.    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 0/2 lymph nodes positive    Patient Stated Goals I want to get rid of the fluid and make sure that I am not doing any thing detrimental to my recovery especially in the R arm.    Currently in Pain? Yes    Pain Score 4     Pain Location Axilla    Pain Orientation Right    Pain Descriptors / Indicators Aching;Sharp    Pain Type Acute pain    Multiple Pain Sites Yes    Pain Score 3    Pain Location Elbow    Pain Orientation Left    Pain Descriptors / Indicators Aching    Pain Type Acute pain                 LYMPHEDEMA/ONCOLOGY QUESTIONNAIRE - 10/17/19 0001      Right Upper Extremity Lymphedema   15 cm Proximal to Olecranon Process 32 cm    10 cm Proximal to Olecranon Process 30.2 cm    Olecranon Process 28 cm  15 cm Proximal to Ulnar Styloid Process 26 cm    10 cm Proximal to Ulnar Styloid Process 22.6 cm    Just Proximal to Ulnar Styloid Process 16.6 cm    Across Hand at [employer] 20 cm    At [place] of 2nd Digit 6 cm      Left Upper Extremity Lymphedema   15 cm Proximal to Olecranon Process 33.4 cm    10 cm Proximal to Olecranon Process 31.8 cm    Olecranon Process 29 cm    15 cm Proximal to Ulnar Styloid Process 27.3 cm    10 cm Proximal to Ulnar Styloid Process 22.6 cm    Just Proximal to Ulnar Styloid Process 16.5 cm    Across Hand at [employer] 20 cm    At [place] of 2nd Digit 6 cm                      [facility] Adult PT Treatment/Exercise - 10/17/19 0001      Manual Therapy   Manual Therapy Manual Lymphatic Drainage (MLD);Edema management    Edema Management cut TG soft for pt to wear on L UE to help with swelling, circumferential measurements taken    Manual Lymphatic Drainage (MLD) In supine: 5 diaphragmatic  breaths, short neck, left inguinal nodes and establishment of axillo inguinal pathway focusing on left lateral trunk moving fluid towards pathway, then to right sidelying to focus futher on left lateral trunk then back to supine retracing all steps, then right inguinal nodes, establishment of right axillo inguinal pathway, R lateral and anterior chest moving fluid towards pathways.  Had pt return demonstrate correct technique and skin stretch to ensure that she was able to do this independently.                     PT Short Term Goals - 09/21/19 1113      PT SHORT TERM GOAL #1   Title Pt will be independent with HEP within 1 week in order to demonstrate autonomy of care.    Baseline pt currently does not have an HEP, 09/21/19- pt is indpendent in current HEP    Time 1    Period Weeks    Status Achieved             PT Long Term Goals - 09/21/19 1109      PT LONG TERM GOAL #1   Title Pt will demonstrate 120 degrees or greater of L shoulder flexion/abduction and R shoulder abduction within 3 weeks in order to return to functional ROM.    Baseline R shoulder flexion: 145, abduction: 100, L shoulder flexion: 115 abduction: 102, 09/21/19- R shoulder flexion- 148, R abduction- 154, L flexion- 137 L abduction- 142    Time 3    Period Weeks    Status Achieved      PT LONG TERM GOAL #2   Title Pt will report 50% improvement in pain and functional mobility within 3 weeks in order to demonstrate a subjective improvement in function of the bil UE.    Baseline 1/10 pain and pt is not performing all her regular activities at home including house work., 09/21/19- 80% improvement    Time 3    Period Weeks    Status Achieved      PT LONG TERM GOAL #3   Title Pt will demonstrate improve upright posture within 3 weeks as demonstrated by decreased anterior placement of the  R shoulder.    Baseline significant anterior rotation as demonstrated by caving on the R due to tightness, 09/21/19- improved     Time 3    Period Weeks    Status Achieved      PT LONG TERM GOAL #4   Title Pt will demonstrate 160 degrees of bilateral shoulder flexion and abduction to allow pt to reach up to the top shelf.    Baseline 09/21/19- R shoulder flexion- 148, R abduction- 154, L flexion- 137 L abduction- 142    Time 4    Period Weeks    Status New    Target Date 10/19/19      PT LONG TERM GOAL #5   Title Pt will be independent in a home exercise program for continued strengthening and stretching.    Time 4    Period Weeks    Status New    Target Date 10/19/19                 Plan - 10/17/19 1557    Clinical Impression Statement Pt reports back to therapy with reports of increased swelling in LUE and across chest. Circumferential measurements taken today demonstrate L UE circumferences increased from May but there is still no more than a 2 cm difference between left and right UEs. Spent session today focusing on MLD to chest and LUE. Pt has an open wound that is healing on anterior medial chest from a heating pad. She states she placed the heating pad on herself and fell asleep and woke up with blisters. Educated pt that heat can cause increase swelling and lead to lymphedema. Reeducated pt in self MLD.    PT Frequency 1x / week    PT Duration 3 weeks    PT Treatment/Interventions Functional mobility training;Therapeutic activities;Neuromuscular re-education;Manual techniques;Manual lymph drainage;Taping;Passive range of motion;Scar mobilization;Therapeutic exercise;ADLs/Self Care Home Management;Compression bandaging;Vasopneumatic Device    PT Next Visit Plan remeasure ROM, MLD lateral trunk, continue with exercises, P/ROM and AA/ROM as well as stretching, myofascial release and STM as needed.    PT Home Exercise Plan post-op exercises,supine scap exercises    Consulted and Agree with Plan of Care Patient           Patient will benefit from skilled therapeutic intervention in order to improve  the following deficits and impairments:  Increased edema, Postural dysfunction, Decreased range of motion, Pain  Visit Diagnosis: Localized edema     Problem List Patient Active Problem List   Diagnosis Date Noted  . Genetic testing 08/03/2019  . Breast cancer, right ([place]) 07/25/2019  . 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 Family history of ovarian cancer   . Family history of breast cancer   . Family history of colon cancer   . Family history of kidney cancer   . Family history of melanoma   . Vitamin D deficiency 12/12/2018  . Essential hypertension, benign 10/15/2015  . Kidney stone 05/02/2015  . Palpitations 02/08/2015  . Atypical chest pain 02/08/2015  . Exertional dyspnea 02/08/2015  . Lower extremity edema 02/08/2015  . Mixed dyslipidemia 03/26/2014  . 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/03/2013  . Depression with anxiety 04/26/2012  . ADHD (attention deficit hyperactivity disorder) 04/26/2012    Allyson Sabal [facility] 10/17/2019, 4:04 PM  [place], [place], [address] Phone: [phone]   Fax:  [phone]  Name: Catherine Cole MRN: [id number] Date of Birth: February 25, 1956  Allyson Sabal [place], [place] 10/17/19 4:04 PM

## 2019-10-24 ENCOUNTER — Ambulatory Visit: Payer: BC Managed Care – PPO | Admitting: Physical Therapy

## 2019-10-29 ENCOUNTER — Ambulatory Visit: Payer: BC Managed Care – PPO | Attending: Hematology and Oncology

## 2019-10-29 ENCOUNTER — Other Ambulatory Visit: Payer: Self-pay

## 2019-10-29 DIAGNOSIS — M25512 Pain in left shoulder: Secondary | ICD-10-CM | POA: Diagnosis present

## 2019-10-29 DIAGNOSIS — R600 Localized edema: Secondary | ICD-10-CM | POA: Insufficient documentation

## 2019-10-29 DIAGNOSIS — M25611 Stiffness of right shoulder, not elsewhere classified: Secondary | ICD-10-CM | POA: Diagnosis present

## 2019-10-29 DIAGNOSIS — M25511 Pain in right shoulder: Secondary | ICD-10-CM | POA: Diagnosis present

## 2019-10-29 DIAGNOSIS — M25612 Stiffness of left shoulder, not elsewhere classified: Secondary | ICD-10-CM | POA: Insufficient documentation

## 2019-10-29 DIAGNOSIS — D0511 Intraductal carcinoma in situ of right breast: Secondary | ICD-10-CM | POA: Diagnosis present

## 2019-10-29 NOTE — Therapy (Signed)
[place], [place], [address] Phone: [phone]   Fax:  [phone]  Physical Therapy Treatment  Patient Details  Name: Catherine Cole MRN: [id number] Date of Birth: Dec 15, 1955 Referring Provider (PT): Nicholas Lose MD   Encounter Date: 10/29/2019   PT End of Session - 10/29/19 1213    Visit Number 6    Number of Visits 11    Date for PT Re-Evaluation 11/26/19    PT Start Time 1109    PT Stop Time 1205    PT Time Calculation (min) 56 min    Activity Tolerance Patient tolerated treatment well    Behavior During Therapy [facility] for tasks assessed/performed           Past Medical History:  Diagnosis Date  . Adult ADHD   . Anemia   . Anxiety   . Complication of anesthesia   . Depression   . Family history of breast cancer   . Family history of colon cancer   . Family history of kidney cancer   . Family history of melanoma   . Family history of ovarian cancer   . Hypertension   . IBS (irritable bowel syndrome)   . PONV (postoperative nausea and vomiting)     Past Surgical History:  Procedure Laterality Date  . APPENDECTOMY     12 yrs  . CHOLECYSTECTOMY  1995  . CHOLESTEATOMA EXCISION     13 and 22 yrs  . FRACTURE SURGERY    . [place]  . LESION DESTRUCTION N/A 12/03/2015   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;  Surgeon: [name], DO;  Location: [place];  Service: Plastics;  Laterality: N/A;  . MASTECTOMY W/ SENTINEL NODE BIOPSY Bilateral 07/25/2019   Procedure: BILATERAL MASTECTOMY WITH RIGHT SENTINEL LYMPH NODE BIOPSY, LEFT RISK REDUCING MASTECTOMY;  Surgeon: [name], MD;  Location: [place];  Service: General;  Laterality: Bilateral;  PEC BLOCK  . TONSILLECTOMY     15 yrs    There were no vitals filed for this visit.   Subjective Assessment - 10/29/19 1113    Subjective I want to  get some of the swelling I'm noticing down from Lt>Rt lateral trunks. I can tell some improvement though from I was here last 2 weeks ago.    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 0/2 lymph nodes positive    Patient Stated Goals I want to get rid of the fluid and make sure that I am not doing any thing detrimental to my recovery especially in the R arm.              [facility] PT Assessment - 10/29/19 0001      AROM   Right Shoulder Flexion 153 Degrees    Right Shoulder ABduction 164 Degrees    Right Shoulder Internal Rotation 70 Degrees    Right Shoulder External Rotation 88 Degrees    Left Shoulder Flexion 152 Degrees    Left Shoulder ABduction 170 Degrees    Left Shoulder Internal Rotation 66 Degrees    Left Shoulder External Rotation 88 Degrees             LYMPHEDEMA/ONCOLOGY QUESTIONNAIRE - 10/29/19 0001      Right Upper Extremity Lymphedema   15 cm Proximal to Olecranon Process 31.6 cm    10 cm Proximal to Olecranon Process 29.9 cm  Olecranon Process 27.3 cm    15 cm Proximal to Ulnar Styloid Process 26.5 cm    10 cm Proximal to Ulnar Styloid Process 23.7 cm    Just Proximal to Ulnar Styloid Process 16.2 cm    Across Hand at [employer] 19.5 cm    At [place] of 2nd Digit 5.9 cm    Other Across chest from lateral incision to lateral incision after exhale: 91.9      Left Upper Extremity Lymphedema   15 cm Proximal to Olecranon Process 32.6 cm    10 cm Proximal to Olecranon Process 30.6 cm    Olecranon Process 28 cm    15 cm Proximal to Ulnar Styloid Process 27 cm    10 cm Proximal to Ulnar Styloid Process 24.4 cm    Just Proximal to Ulnar Styloid Process 16.4 cm    Across Hand at [employer] 19.4 cm    At [place] of 2nd Digit 5.9 cm                      [facility] Adult PT Treatment/Exercise - 10/29/19 0001      Manual Therapy   Manual Lymphatic Drainage (MLD) In Supine: Short neck, 5 diaphragmatic  breaths, Lt inguinal nodes, Lt axillo-inguinal anastomosis, then focused on lateral trunk edema, and then included Lt UE briefly working from upper lateral arm to dorsum of hand and retracing all steps.                     PT Short Term Goals - 09/21/19 1113      PT SHORT TERM GOAL #1   Title Pt will be independent with HEP within 1 week in order to demonstrate autonomy of care.    Baseline pt currently does not have an HEP, 09/21/19- pt is indpendent in current HEP    Time 1    Period Weeks    Status Achieved             PT Long Term Goals - 10/29/19 1635      PT LONG TERM GOAL #4   Title Pt will demonstrate 160 degrees of bilateral shoulder flexion and abduction to allow pt to reach up to the top shelf.    Baseline 09/21/19- R shoulder flexion- 148, R abduction- 154, L flexion- 137 L abduction- 142; Rt abd 164 and flex 153, Lt abd 170 and flex 152 degrees - 10/29/19    Status Partially Met      PT LONG TERM GOAL #5   Title Pt will be independent in a home exercise program for continued strengthening and stretching.    Status On-going                 Plan - 10/29/19 1637    Clinical Impression Statement Pt returns to therapy with continue c/o increased swelling, though reports this has some improved, in Lt UE and lateral trunk. She reports has not worn thick stockinette issued at last session yet and does not wear her compression tanks much. So encouraged her to wear both as much as able during the day. Focused on manual therapy to decrease Lt UE and lateral trunk swelling. Pt will benefit from a few more sessions to determine if she has any other compression needs for home if options she has now, after trying them, aren't helpful.    Personal Factors and Comorbidities Comorbidity 1    Comorbidities Bil masectomy with 2 lymph  node removal on the R    Stability/Clinical Decision Making Stable/Uncomplicated    Rehab Potential Good    PT Frequency 1x / week    PT  Duration 4 weeks    PT Treatment/Interventions Functional mobility training;Therapeutic activities;Neuromuscular re-education;Manual techniques;Manual lymph drainage;Taping;Passive range of motion;Scar mobilization;Therapeutic exercise;ADLs/Self Care Home Management;Compression bandaging;Vasopneumatic Device    PT Next Visit Plan Renewal this session; MLD lateral trunk, continue with exercises, P/ROM and AA/ROM as well as stretching, myofascial release and STM as needed; any other compression needs? How was stockinette for Lt UE and compression tanks for trunk edema?    PT Home Exercise Plan post-op exercises,supine scap exercises; wear compression    Consulted and Agree with Plan of Care Patient           Patient will benefit from skilled therapeutic intervention in order to improve the following deficits and impairments:  Increased edema, Postural dysfunction, Decreased range of motion, Pain  Visit Diagnosis: Localized edema  Stiffness of left shoulder, not elsewhere classified  Acute pain of left shoulder  Stiffness of right shoulder, not elsewhere classified  Acute pain of right shoulder  Ductal carcinoma in situ (DCIS) of right breast     Problem List Patient Active Problem List   Diagnosis Date Noted  . Genetic testing 08/03/2019  . Breast cancer, right ([place]) 07/25/2019  . 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 Family history of ovarian cancer   . Family history of breast cancer   . Family history of colon cancer   . Family history of kidney cancer   . Family history of melanoma   . Vitamin D deficiency 12/12/2018  . Essential hypertension, benign 10/15/2015  . Kidney stone 05/02/2015  . Palpitations 02/08/2015  . Atypical chest pain 02/08/2015  . Exertional dyspnea 02/08/2015  . Lower extremity edema 02/08/2015  . Mixed dyslipidemia 03/26/2014  . 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/03/2013  . Depression with anxiety 04/26/2012  .  ADHD (attention deficit hyperactivity disorder) 04/26/2012    Otelia Limes, PTA 10/29/2019, 4:42 PM  [place], [place], [address] Phone: [phone]   Fax:  [phone]  Name: Catherine Cole MRN: [id number] Date of Birth: [birth year]

## 2019-10-31 NOTE — Addendum Note (Signed)
Addended by: Manus Gunning L on: 10/31/2019 12:23 PM   Modules accepted: Orders

## 2019-11-07 ENCOUNTER — Ambulatory Visit: Payer: BC Managed Care – PPO | Admitting: Physical Therapy

## 2019-11-12 ENCOUNTER — Encounter (INDEPENDENT_AMBULATORY_CARE_PROVIDER_SITE_OTHER): Payer: Self-pay | Admitting: Otolaryngology

## 2019-11-12 ENCOUNTER — Ambulatory Visit: Payer: BC Managed Care – PPO

## 2019-11-12 ENCOUNTER — Other Ambulatory Visit: Payer: Self-pay

## 2019-11-12 ENCOUNTER — Ambulatory Visit (INDEPENDENT_AMBULATORY_CARE_PROVIDER_SITE_OTHER): Payer: BC Managed Care – PPO | Admitting: Otolaryngology

## 2019-11-12 VITALS — Temp 97.3°F

## 2019-11-12 DIAGNOSIS — M25611 Stiffness of right shoulder, not elsewhere classified: Secondary | ICD-10-CM

## 2019-11-12 DIAGNOSIS — R600 Localized edema: Secondary | ICD-10-CM

## 2019-11-12 DIAGNOSIS — Z8669 Personal history of other diseases of the nervous system and sense organs: Secondary | ICD-10-CM | POA: Diagnosis not present

## 2019-11-12 DIAGNOSIS — M25612 Stiffness of left shoulder, not elsewhere classified: Secondary | ICD-10-CM

## 2019-11-12 DIAGNOSIS — H9011 Conductive hearing loss, unilateral, right ear, with unrestricted hearing on the contralateral side: Secondary | ICD-10-CM | POA: Diagnosis not present

## 2019-11-12 DIAGNOSIS — M25511 Pain in right shoulder: Secondary | ICD-10-CM

## 2019-11-12 DIAGNOSIS — D0511 Intraductal carcinoma in situ of right breast: Secondary | ICD-10-CM

## 2019-11-12 DIAGNOSIS — M25512 Pain in left shoulder: Secondary | ICD-10-CM

## 2019-11-12 DIAGNOSIS — K111 Hypertrophy of salivary gland: Secondary | ICD-10-CM | POA: Diagnosis not present

## 2019-11-12 NOTE — Therapy (Signed)
[place], [place], [address] Phone: [phone]   Fax:  [phone]  Physical Therapy Treatment  Patient Details  Name: Catherine Cole MRN: [id number] Date of Birth: 02-08-1956 Referring Provider (PT): Nicholas Lose MD   Encounter Date: 11/12/2019   PT End of Session - 11/12/19 1402    Visit Number 7    Number of Visits 11    Date for PT Re-Evaluation 11/26/19    PT Start Time 1302    PT Stop Time 1401    PT Time Calculation (min) 59 min    Activity Tolerance Patient tolerated treatment well    Behavior During Therapy [facility] for tasks assessed/performed           Past Medical History:  Diagnosis Date  . Adult ADHD   . Anemia   . Anxiety   . Complication of anesthesia   . Depression   . Family history of breast cancer   . Family history of colon cancer   . Family history of kidney cancer   . Family history of melanoma   . Family history of ovarian cancer   . Hypertension   . IBS (irritable bowel syndrome)   . PONV (postoperative nausea and vomiting)     Past Surgical History:  Procedure Laterality Date  . APPENDECTOMY     12 yrs  . CHOLECYSTECTOMY  1995  . CHOLESTEATOMA EXCISION     13 and 22 yrs  . FRACTURE SURGERY    . [place]  . LESION DESTRUCTION N/A 12/03/2015   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;  Surgeon: [name], DO;  Location: [place];  Service: Plastics;  Laterality: N/A;  . MASTECTOMY W/ SENTINEL NODE BIOPSY Bilateral 07/25/2019   Procedure: BILATERAL MASTECTOMY WITH RIGHT SENTINEL LYMPH NODE BIOPSY, LEFT RISK REDUCING MASTECTOMY;  Surgeon: [name], MD;  Location: [place];  Service: General;  Laterality: Bilateral;  PEC BLOCK  . TONSILLECTOMY     15 yrs    There were no vitals filed for this visit.   Subjective Assessment - 11/12/19 1307    Subjective I've been  wearing my compression bra daily and I went and got more compression camis and I've been wearing them daily. I also got back to wearing the thick stockinette she gave me at one of my PT sessions and it helped. Overall my swelling is improved since I was here last.    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 0/2 lymph nodes positive    Patient Stated Goals I want to get rid of the fluid and make sure that I am not doing any thing detrimental to my recovery especially in the R arm.    Currently in Pain? No/denies                             [facility] Adult PT Treatment/Exercise - 11/12/19 0001      Manual Therapy   Manual Therapy Myofascial release;Manual Lymphatic Drainage (MLD)    Edema Management cut a second piece of TG soft for pt to wear on Lt UE    Myofascial Release To bil axillae during P/ROM    Manual Lymphatic Drainage (MLD) In Supine: Short neck, 5 diaphragmatic breaths, Lt inguinal nodes, Lt axillo-inguinal anastomosis, then focused on lateral trunk edema, and then included Lt  UE briefly working from upper lateral arm to dorsum of hand and retracing all steps.     Passive ROM To bil shoulders into flexion, abduction, er and D2 to pts end ROM                    PT Short Term Goals - 09/21/19 1113      PT SHORT TERM GOAL #1   Title Pt will be independent with HEP within 1 week in order to demonstrate autonomy of care.    Baseline pt currently does not have an HEP, 09/21/19- pt is indpendent in current HEP    Time 1    Period Weeks    Status Achieved             PT Long Term Goals - 10/29/19 1635      PT LONG TERM GOAL #4   Title Pt will demonstrate 160 degrees of bilateral shoulder flexion and abduction to allow pt to reach up to the top shelf.    Baseline 09/21/19- R shoulder flexion- 148, R abduction- 154, L flexion- 137 L abduction- 142; Rt abd 164 and flex 153, Lt abd 170 and flex 152 degrees -  10/29/19    Status Partially Met      PT LONG TERM GOAL #5   Title Pt will be independent in a home exercise program for continued strengthening and stretching.    Status On-going                 Plan - 11/12/19 1730    Clinical Impression Statement Continued with focus on manual therapy. Excellent visible and palpable reductions at Rt >Lt lateral trunk and Lt UE noted since last session. Pt has worn the TG soft some on her Lt UE, and has been more consistent with wearing her compression bra and compression camis. Issued script from Dr. Lindi Adie for compression sleeve/gauntlet for pt to see if insurance will cover Lt UE, if not will get one for right as a precaution. Pt decided at end of session to place herself on hold for next few weeks to see how she does and will call us to let us know if she needs further physical therapy.    Personal Factors and Comorbidities Comorbidity 1    Comorbidities Bil masectomy with 2 lymph node removal on the R    Stability/Clinical Decision Making Stable/Uncomplicated    Rehab Potential Good    PT Frequency 1x / week    PT Duration 4 weeks    PT Treatment/Interventions Functional mobility training;Therapeutic activities;Neuromuscular re-education;Manual techniques;Manual lymph drainage;Taping;Passive range of motion;Scar mobilization;Therapeutic exercise;ADLs/Self Care Home Management;Compression bandaging;Vasopneumatic Device    PT Next Visit Plan If pt returns may be for final assess. Check compression sleeve if she attained one, review self MLD technique and probable D/C.    PT Home Exercise Plan post-op exercises,supine scap exercises; wear compression    Consulted and Agree with Plan of Care Patient           Patient will benefit from skilled therapeutic intervention in order to improve the following deficits and impairments:  Increased edema, Postural dysfunction, Decreased range of motion, Pain  Visit Diagnosis: Localized edema  Stiffness of  left shoulder, not elsewhere classified  Acute pain of left shoulder  Stiffness of right shoulder, not elsewhere classified  Acute pain of right shoulder  Ductal carcinoma in situ (DCIS) of right breast     Problem List Patient Active Problem List  Diagnosis Date Noted  . Genetic testing 08/03/2019  . Breast cancer, right ([place]) 07/25/2019  . 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 Family history of ovarian cancer   . Family history of breast cancer   . Family history of colon cancer   . Family history of kidney cancer   . Family history of melanoma   . Vitamin D deficiency 12/12/2018  . Essential hypertension, benign 10/15/2015  . Kidney stone 05/02/2015  . Palpitations 02/08/2015  . Atypical chest pain 02/08/2015  . Exertional dyspnea 02/08/2015  . Lower extremity edema 02/08/2015  . Mixed dyslipidemia 03/26/2014  . 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/03/2013  . Depression with anxiety 04/26/2012  . ADHD (attention deficit hyperactivity disorder) 04/26/2012    Otelia Limes, PTA 11/12/2019, 5:37 PM  [place], [place], [address] Phone: [phone]   Fax:  [phone]  Name: Brenly Trawick MRN: [id number] Date of Birth: [birth year]

## 2019-11-12 NOTE — Progress Notes (Signed)
HPI: Joellyn Grandt is a 64 y.o. female who presents for evaluation of chronic right earache.  She has had previous history of cholesteatoma and has had surgery by Dr. Guy Begin in [facility] as her initial surgery.  She had recurrent cholesteatoma and had a second surgery performed by Dr.Kraus.  On her last visit with Dr. Thornell Mule everything appeared clear with no evidence of cholesteatoma.  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 She has had no recent drainage from the ear. She has noticed swelling in front of the ear over the past 6 months. She recently underwent surgery for breast cancer in April of this year.  Past Medical History:  Diagnosis Date  . Adult ADHD   . Anemia   . Anxiety   . Complication of anesthesia   . Depression   . Family history of breast cancer   . Family history of colon cancer   . Family history of kidney cancer   . Family history of melanoma   . Family history of ovarian cancer   . Hypertension   . IBS (irritable bowel syndrome)   . PONV (postoperative nausea and vomiting)    Past Surgical History:  Procedure Laterality Date  . APPENDECTOMY     12 yrs  . CHOLECYSTECTOMY  1995  . CHOLESTEATOMA EXCISION     13 and 22 yrs  . FRACTURE SURGERY    . [place]  . LESION DESTRUCTION N/A 12/03/2015   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;  Surgeon: [name], DO;  Location: [place];  Service: Plastics;  Laterality: N/A;  . MASTECTOMY W/ SENTINEL NODE BIOPSY Bilateral 07/25/2019   Procedure: BILATERAL MASTECTOMY WITH RIGHT SENTINEL LYMPH NODE BIOPSY, LEFT RISK REDUCING MASTECTOMY;  Surgeon: [name], MD;  Location: [place];  Service: General;  Laterality: Bilateral;  PEC BLOCK  . TONSILLECTOMY     15 yrs   Social History   Socioeconomic History  . Marital status: Widowed    Spouse name: Not on file  . Number of  children: Not on file  . Years of education: Not on file  . Highest education level: Not on file  Occupational History  . Not on file  Tobacco Use  . Smoking status: Former Smoker    Packs/day: 1.00    Years: 7.00    Pack years: 7.00  . Smokeless tobacco: Never Used  . Tobacco comment: some day smoker when she smoked  Substance and Sexual Activity  . Alcohol use: No  . Drug use: No  . Sexual activity: Never  Other Topics Concern  . Not on file  Social History Narrative  . Not on file   Social Determinants of Health   Financial Resource Strain:   . Difficulty of Paying Living Expenses:   Food Insecurity:   . Worried About Charity fundraiser in the Last Year:   . Arboriculturist in the Last Year:   Transportation Needs:   . Film/video editor (Medical):   Marland Kitchen Lack of Transportation (Non-Medical):   Physical Activity:   . Days of Exercise per Week:   . Minutes of Exercise per Session:   Stress:   . Feeling of Stress :   Social Connections:   . Frequency of Communication with Friends and Family:   . Frequency of Social Gatherings with Friends and Family:   .  Attends Religious Services:   . Active Member of Clubs or Organizations:   . Attends Archivist Meetings:   Marland Kitchen Marital Status:    Family History  Problem Relation Age of Onset  . Heart disease Mother 70       Heart attack  . Colon polyps Mother   . Melanoma Mother   . Alcohol abuse Father   . Diabetes Father   . Colon polyps Sister   . Colon polyps Brother   . Colon polyps Son   . Brain cancer Maternal Aunt        pituitary cancer over 50  . Lung cancer Maternal Uncle   . Colon polyps Sister   . Melanoma Maternal Aunt   . Ovarian cancer Maternal Aunt   . Breast cancer Cousin        maternal first cousin  . Kidney cancer Cousin        mat first cousin  . Colon cancer Cousin        mat first cousin   Allergies  Allergen Reactions  . Latex Hives  . Demerol Nausea And Vomiting  . Codeine  Nausea And Vomiting  . Erythromycin Itching and Rash  . Neosporin [Neomycin-Polymyxin B Gu] Itching and Rash   Prior to Admission medications   Medication Sig Start Date End Date Taking? Authorizing Provider  busPIRone (BUSPAR) 10 MG tablet TAKE 1 TABLET BY MOUTH EVERYDAY AT BEDTIME 10/04/19  Yes Rutherford Guys, MD  chlorthalidone (HYGROTON) 25 MG tablet Take 1 tablet (25 mg total) by mouth daily. 09/14/19  Yes Rutherford Guys, MD  citalopram (CELEXA) 40 MG tablet TAKE 1 TABLET BY MOUTH EVERY DAY 10/06/19  Yes Rutherford Guys, MD  ibuprofen (ADVIL) 800 MG tablet Take 1 tablet (800 mg total) by mouth every 8 (eight) hours as needed. 07/25/19  Yes Cornett, [name], MD  Incontinence Supply Disposable (DEPEND EASY FIT UNDERGARMENTS) MISC Use daily as directed. Change several times daily as needed. 06/29/18  Yes Rutherford Guys, MD  lisdexamfetamine (VYVANSE) 50 MG capsule Take 1 capsule (50 mg total) by mouth daily. Ok to fill 30 days after signature 10/04/19  Yes Rutherford Guys, MD  lisdexamfetamine (VYVANSE) 50 MG capsule Take 1 capsule (50 mg total) by mouth daily. Ok to fill 60 days after signature 10/04/19  Yes Rutherford Guys, MD  losartan (COZAAR) 50 MG tablet Take 1 tablet (50 mg total) by mouth daily. 09/14/19  Yes Rutherford Guys, MD  Multiple Vitamin (MULTIVITAMIN WITH MINERALS) TABS tablet Take 1 tablet by mouth daily.   Yes [provider]  oxyCODONE (OXY IR/ROXICODONE) 5 MG immediate release tablet Take 1 tablet (5 mg total) by mouth every 6 (six) hours as needed for severe pain. 07/25/19  Yes Cornett, [name], MD  valACYclovir (VALTREX) 500 MG tablet Take 2 tablets by mouth at acute onset for outbreak, followed by 1 tablet once a day x 5 days 12/12/18  Yes Rutherford Guys, MD  Vitamin D, Ergocalciferol, (DRISDOL) 1.25 MG (50000 UNIT) CAPS capsule TAKE 1 CAPSULE BY MOUTH EVERY 7 DAYS 07/04/19  Yes Rutherford Guys, MD  lisdexamfetamine (VYVANSE) 50 MG capsule Take 1 capsule (50 mg  total) by mouth daily before breakfast. 10/04/19 11/03/19  Rutherford Guys, MD     Positive ROS: Otherwise negative  All other systems have been reviewed and were otherwise negative with the exception of those mentioned in the HPI and as above.  Physical Exam: Constitutional: Alert,  well-appearing, no acute distress Ears: External ears without lesions or tenderness.  Left ear canal and left TM are normal to evaluation.  Right ear canal is narrow with intact TM which is a little bit abnormal in appearance but no clinical evidence of cholesteatoma.  On tuning fork testing Weber lateralized to the right and BC was greater than AC on the right side. Nasal: External nose without lesions. Clear nasal passages Oral: Lips and gums without lesions. Tongue and palate mucosa without lesions. Posterior oropharynx clear.  Patient is status post tonsillectomy.  Drainage from the parotid duct was clear. Neck: No palpable adenopathy or masses.  Patient has diffusely swollen right parotid gland although no erythema and minimal tenderness.  No palpable adenopathy lower in the neck. Respiratory: Breathing comfortably  Skin: No facial/neck lesions or rash noted.  Procedures  Assessment: Right parotid gland enlargement.  Questionable sialoadenitis although this has been going on for 6 months.  Plan: Placed on Keflex 500 mg twice daily for 10 days. We will plan on obtaining a CT scan of the neck to evaluate the right parotid gland enlargement and have her follow-up in 2 to 3 weeks. Also discussed with her concerning obtaining previous operative note from Dr. Thornell Mule as well as last hearing test from Dr. Thornell Mule. We will plan on scheduling hearing test on return appointment.  Radene Journey, MD

## 2019-11-29 ENCOUNTER — Telehealth (INDEPENDENT_AMBULATORY_CARE_PROVIDER_SITE_OTHER): Payer: Self-pay

## 2019-11-29 ENCOUNTER — Other Ambulatory Visit (INDEPENDENT_AMBULATORY_CARE_PROVIDER_SITE_OTHER): Payer: Self-pay

## 2019-11-29 DIAGNOSIS — D37030 Neoplasm of uncertain behavior of the parotid salivary glands: Secondary | ICD-10-CM

## 2019-12-03 ENCOUNTER — Ambulatory Visit (INDEPENDENT_AMBULATORY_CARE_PROVIDER_SITE_OTHER): Payer: BC Managed Care – PPO | Admitting: Family Medicine

## 2019-12-03 ENCOUNTER — Other Ambulatory Visit: Payer: Self-pay

## 2019-12-03 ENCOUNTER — Ambulatory Visit (INDEPENDENT_AMBULATORY_CARE_PROVIDER_SITE_OTHER): Payer: BC Managed Care – PPO | Admitting: Otolaryngology

## 2019-12-03 ENCOUNTER — Encounter: Payer: Self-pay | Admitting: Family Medicine

## 2019-12-03 VITALS — BP 136/84 | HR 88 | Temp 98.1°F | Ht 67.0 in | Wt 183.0 lb

## 2019-12-03 DIAGNOSIS — Z853 Personal history of malignant neoplasm of breast: Secondary | ICD-10-CM | POA: Diagnosis not present

## 2019-12-03 DIAGNOSIS — I10 Essential (primary) hypertension: Secondary | ICD-10-CM

## 2019-12-03 DIAGNOSIS — I890 Lymphedema, not elsewhere classified: Secondary | ICD-10-CM | POA: Diagnosis not present

## 2019-12-03 NOTE — Progress Notes (Signed)
8/16/20212:20 PM  Catherine Cole [birth year], 64 y.o., female [id number]  Chief Complaint  Patient presents with  . Ear Problem    went to ent, has ct scan for 8/24 was told it was the parotid gland causing the swelling. Did have surgery there 41 yrs ago.  Thinks problem may have returned. Wants to talk about receiving the covid vaccine. Told she can only get the vaccine in her thigh. Wants temp handicapped placard.   . Medication Problem    wants to know if she is to take the ibuprofen due to it being a steroid    HPI:   Patient is a 64 y.o. female with past medical history significant for ADD, HTN, depression with anxiety, vitamin D deficiencyand urinaryincontinence, right breast cancerwho presents today with several concerns  Has been seeing ENT- Dr Radene Journey - for her parathryoid enlargement, right sided H/o right sided cholesteatoma removal  Lymphedema BUE and chest after double mastectomy Sees breast surgeon next week Has made dietary changes Working with PT, wears compression garments Can not carry anything more than couple of lbs struggles with her carrying her laptop, groceries, etc using a fanny pack as cant carry a purse anymore Help from neighbors, etc The heat has been making lymphedema worse Requesting disability placard Her surgeon said she can get covid vaccine in her thigh - cant get it done at [facility] due to location - [facility] She is able to work from home if given letter, she would only come in when face to face interactions were critically needed She is considering retirement She goes to counseling every 2 weeks     Depression screen [facility] 2/9 10/04/2019 09/14/2019 05/17/2019  Decreased Interest 0 0 0  Down, Depressed, Hopeless 0 0 0  PHQ - 2 Score 0 0 0  Altered sleeping - - -  Tired, decreased energy - - -  Change in appetite - - -  Feeling bad or failure about yourself  - - -  Trouble concentrating - - -  Moving  slowly or fidgety/restless - - -  Suicidal thoughts - - -  PHQ-9 Score - - -  Difficult doing work/chores - - -  Some recent data might be hidden    Fall Risk  12/03/2019 09/14/2019 05/17/2019 03/19/2019 12/12/2018  Falls in the past year? 0 0 0 0 0  Number falls in past yr: 0 0 0 0 0  Injury with Fall? 1 0 0 0 0  Risk for fall due to : Medication side effect;Mental status change - - - -     Allergies  Allergen Reactions  . Latex Hives  . Demerol Nausea And Vomiting  . Codeine Nausea And Vomiting  . Erythromycin Itching and Rash  . Neosporin [Neomycin-Polymyxin B Gu] Itching and Rash    Prior to Admission medications   Medication Sig Start Date End Date Taking? Authorizing Provider  busPIRone (BUSPAR) 10 MG tablet TAKE 1 TABLET BY MOUTH EVERYDAY AT BEDTIME 10/04/19  Yes Rutherford Guys, MD  chlorthalidone (HYGROTON) 25 MG tablet Take 1 tablet (25 mg total) by mouth daily. 09/14/19  Yes Rutherford Guys, MD  citalopram (CELEXA) 40 MG tablet TAKE 1 TABLET BY MOUTH EVERY DAY 10/06/19  Yes Rutherford Guys, MD  Incontinence Supply Disposable (DEPEND EASY FIT UNDERGARMENTS) MISC Use daily as directed. Change several times daily as needed. 06/29/18  Yes Rutherford Guys, MD  lisdexamfetamine (VYVANSE) 50 MG capsule Take 1  capsule (50 mg total) by mouth daily. Ok to fill 30 days after signature 10/04/19  Yes Rutherford Guys, MD  lisdexamfetamine (VYVANSE) 50 MG capsule Take 1 capsule (50 mg total) by mouth daily. Ok to fill 60 days after signature 10/04/19  Yes Rutherford Guys, MD  losartan (COZAAR) 50 MG tablet Take 1 tablet (50 mg total) by mouth daily. 09/14/19  Yes Rutherford Guys, MD  Multiple Vitamin (MULTIVITAMIN WITH MINERALS) TABS tablet Take 1 tablet by mouth daily.   Yes [provider]  oxyCODONE (OXY IR/ROXICODONE) 5 MG immediate release tablet Take 1 tablet (5 mg total) by mouth every 6 (six) hours as needed for severe pain. 07/25/19  Yes Cornett, [name], MD  valACYclovir  (VALTREX) 500 MG tablet Take 2 tablets by mouth at acute onset for outbreak, followed by 1 tablet once a day x 5 days 12/12/18  Yes Rutherford Guys, MD  Vitamin D, Ergocalciferol, (DRISDOL) 1.25 MG (50000 UNIT) CAPS capsule TAKE 1 CAPSULE BY MOUTH EVERY 7 DAYS 07/04/19  Yes Rutherford Guys, MD  ibuprofen (ADVIL) 800 MG tablet Take 1 tablet (800 mg total) by mouth every 8 (eight) hours as needed. Patient not taking: Reported on 12/03/2019 07/25/19   [name], MD  lisdexamfetamine (VYVANSE) 50 MG capsule Take 1 capsule (50 mg total) by mouth daily before breakfast. 10/04/19 11/03/19  Rutherford Guys, MD    Past Medical History:  Diagnosis Date  . Adult ADHD   . Anemia   . Anxiety   . Complication of anesthesia   . Depression   . Family history of breast cancer   . Family history of colon cancer   . Family history of kidney cancer   . Family history of melanoma   . Family history of ovarian cancer   . Hypertension   . IBS (irritable bowel syndrome)   . PONV (postoperative nausea and vomiting)     Past Surgical History:  Procedure Laterality Date  . APPENDECTOMY     12 yrs  . CHOLECYSTECTOMY  1995  . CHOLESTEATOMA EXCISION     13 and 22 yrs  . FRACTURE SURGERY    . [place]  . LESION DESTRUCTION N/A 12/03/2015   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;  Surgeon: [name], DO;  Location: [place];  Service: Plastics;  Laterality: N/A;  . MASTECTOMY W/ SENTINEL NODE BIOPSY Bilateral 07/25/2019   Procedure: BILATERAL MASTECTOMY WITH RIGHT SENTINEL LYMPH NODE BIOPSY, LEFT RISK REDUCING MASTECTOMY;  Surgeon: [name], MD;  Location: [place];  Service: General;  Laterality: Bilateral;  PEC BLOCK  . TONSILLECTOMY     15 yrs    Social History   Tobacco Use  . Smoking status: Former Smoker    Packs/day: 1.00    Years: 7.00    Pack years: 7.00  . Smokeless tobacco: Never Used  . Tobacco  comment: some day smoker when she smoked  Substance Use Topics  . Alcohol use: No    Family History  Problem Relation Age of Onset  . Heart disease Mother 41       Heart attack  . Colon polyps Mother   . Melanoma Mother   . Alcohol abuse Father   . Diabetes Father   . Colon polyps Sister   . Colon polyps Brother   . Colon polyps Son   . Brain cancer Maternal Aunt  pituitary cancer over 23  . Lung cancer Maternal Uncle   . Colon polyps Sister   . Melanoma Maternal Aunt   . Ovarian cancer Maternal Aunt   . Breast cancer Cousin        maternal first cousin  . Kidney cancer Cousin        mat first cousin  . Colon cancer Cousin        mat first cousin    ROS Per hpi  OBJECTIVE:  Today's Vitals   12/03/19 1410  BP: 136/84  Pulse: 88  Temp: 98.1 F (36.7 C)  SpO2: 95%  Weight: 183 lb (83 kg)  Height: 5\' 7"  (1.702 m)   Body mass index is 28.66 kg/m.   Physical Exam Vitals and nursing note reviewed.  Constitutional:      Appearance: She is well-developed.  HENT:     Head: Normocephalic and atraumatic.  Eyes:     General: No scleral icterus.    Conjunctiva/sclera: Conjunctivae normal.     Pupils: Pupils are equal, round, and reactive to light.  Pulmonary:     Effort: Pulmonary effort is normal.  Musculoskeletal:     Cervical back: Neck supple.  Skin:    General: Skin is warm and dry.  Neurological:     Mental Status: She is alert and oriented to person, place, and time.     No results found for this or any previous visit (from the past 24 hour(s)).  No results found.   ASSESSMENT and PLAN  1. Lymphedema 2. History of breast cancer Patient under care of onc, breast surgeon and PT.  Letter to work from home 2-3 x week given Disability parking placard application completed for 6 months Reassess functionality at next OV  3. Essential hypertension, benign Controlled. Continue current regime.   Return in about 3 months (around  03/04/2020).    Rutherford Guys, MD Primary Care at [place], [place] [address] Ph.  [phone] Fax [phone]

## 2019-12-03 NOTE — Patient Instructions (Signed)
pcp    If you have lab work done today you will be contacted with your lab results within the next 2 weeks.  If you have not heard from us then please contact us. The fastest way to get your results is to register for My Chart.   IF you received an x-ray today, you will receive an invoice from [facility]. Please contact [facility] at [phone] with questions or concerns regarding your invoice.   IF you received labwork today, you will receive an invoice from [facility]. Please contact [facility] at [phone] with questions or concerns regarding your invoice.   Our billing staff will not be able to assist you with questions regarding bills from these companies.  You will be contacted with the lab results as soon as they are available. The fastest way to get your results is to activate your My Chart account. Instructions are located on the last page of this paperwork. If you have not heard from us regarding the results in 2 weeks, please contact this office.     

## 2019-12-04 ENCOUNTER — Encounter: Payer: Self-pay | Admitting: Family Medicine

## 2019-12-07 ENCOUNTER — Other Ambulatory Visit: Payer: Self-pay | Admitting: Family Medicine

## 2019-12-11 ENCOUNTER — Ambulatory Visit
Admission: RE | Admit: 2019-12-11 | Discharge: 2019-12-11 | Disposition: A | Discharge status: Home / Self Care | Payer: BC Managed Care – PPO | Source: Ambulatory Visit | Attending: Otolaryngology | Admitting: Otolaryngology

## 2019-12-11 DIAGNOSIS — D37030 Neoplasm of uncertain behavior of the parotid salivary glands: Secondary | ICD-10-CM

## 2019-12-11 MED ORDER — IOPAMIDOL (ISOVUE-300) INJECTION 61%
75.0000 mL | Freq: Once | INTRAVENOUS | Status: AC | PRN
  Administered 2019-12-11: 75 mL via INTRAVENOUS

## 2019-12-14 ENCOUNTER — Ambulatory Visit (INDEPENDENT_AMBULATORY_CARE_PROVIDER_SITE_OTHER): Payer: BC Managed Care – PPO | Admitting: Otolaryngology

## 2019-12-17 ENCOUNTER — Ambulatory Visit (INDEPENDENT_AMBULATORY_CARE_PROVIDER_SITE_OTHER): Payer: BC Managed Care – PPO | Admitting: Otolaryngology

## 2019-12-17 ENCOUNTER — Encounter (INDEPENDENT_AMBULATORY_CARE_PROVIDER_SITE_OTHER): Payer: Self-pay | Admitting: Otolaryngology

## 2019-12-17 ENCOUNTER — Other Ambulatory Visit: Payer: Self-pay

## 2019-12-17 VITALS — Temp 97.9°F

## 2019-12-17 DIAGNOSIS — H6121 Impacted cerumen, right ear: Secondary | ICD-10-CM

## 2019-12-17 DIAGNOSIS — H9071 Mixed conductive and sensorineural hearing loss, unilateral, right ear, with unrestricted hearing on the contralateral side: Secondary | ICD-10-CM

## 2019-12-17 NOTE — Progress Notes (Signed)
HPI: Catherine Cole is a 64 y.o. female who returns today for evaluation of hearing and to review the CT scan of her right parotid gland enlargement.  On review of the CT scan she has slightly enlarged right parotid gland compared to the left but no parotid masses noted.  No parotid calculi noted.  The parotid gland was a little lobulated but no signs of infection or tumor.  She still notices occasional drainage from the right ear and used to have this cleaned regularly with Dr. Thornell Mule. Audiologic testing today demonstrated left ear hearing to be approximately 20-25 dB and right ear hearing to the a mixed loss of approximately 60 dB..  Past Medical History:  Diagnosis Date   Adult ADHD    Anemia    Anxiety    Complication of anesthesia    Depression    Family history of breast cancer    Family history of colon cancer    Family history of kidney cancer    Family history of melanoma    Family history of ovarian cancer    Hypertension    IBS (irritable bowel syndrome)    PONV (postoperative nausea and vomiting)    Past Surgical History:  Procedure Laterality Date   APPENDECTOMY     12 yrs   [place]     13 and 22 yrs   FRACTURE SURGERY     KIDNEY STONE SURGERY  1995   LESION DESTRUCTION N/A 12/03/2015   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;  Surgeon: [name], DO;  Location: [place];  Service: Plastics;  Laterality: N/A;   MASTECTOMY W/ SENTINEL NODE BIOPSY Bilateral 07/25/2019   Procedure: BILATERAL MASTECTOMY WITH RIGHT SENTINEL LYMPH NODE BIOPSY, LEFT RISK REDUCING MASTECTOMY;  Surgeon: [name], MD;  Location: [place];  Service: General;  Laterality: Bilateral;  PEC BLOCK   TONSILLECTOMY     15 yrs   Social History   Socioeconomic History   Marital status: Widowed    Spouse name: Not on file   Number of children: Not on file    Years of education: Not on file   Highest education level: Not on file  Occupational History   Not on file  Tobacco Use   Smoking status: Former Smoker    Packs/day: 1.00    Years: 7.00    Pack years: 7.00   Smokeless tobacco: Never Used   Tobacco comment: some day smoker when she smoked  Substance and Sexual Activity   Alcohol use: No   Drug use: No   Sexual activity: Never  Other Topics Concern   Not on file  Social History Narrative   Not on file   Social Determinants of Health   Financial Resource Strain:    Difficulty of Paying Living Expenses: Not on file  Food Insecurity:    Worried About [place] in the Last Year: Not on file   Ran Out of Food in the Last Year: Not on file  Transportation Needs:    Lack of Transportation (Medical): Not on file   Lack of Transportation (Non-Medical): Not on file  Physical Activity:    Days of Exercise per Week: Not on file   Minutes of Exercise per Session: Not on file  Stress:    Feeling of Stress : Not on file  Social Connections:    Frequency of Communication with Friends and Family: Not on file  Frequency of Social Gatherings with Friends and Family: Not on file   Attends Religious Services: Not on file   Active Member of Clubs or Organizations: Not on file   Attends Archivist Meetings: Not on file   Marital Status: Not on file   Family History  Problem Relation Age of Onset   Heart disease Mother 64       Heart attack   Colon polyps Mother    Melanoma Mother    Alcohol abuse Father    Diabetes Father    Colon polyps Sister    Colon polyps Brother    Colon polyps Son    Brain cancer Maternal Aunt        pituitary cancer over 74   Lung cancer Maternal Uncle    Colon polyps Sister    Melanoma Maternal Aunt    Ovarian cancer Maternal Aunt    Breast cancer Cousin        maternal first cousin   Kidney cancer Cousin        mat first cousin   Colon  cancer Cousin        mat first cousin   Allergies  Allergen Reactions   Latex Hives   Demerol Nausea And Vomiting   Codeine Nausea And Vomiting   Erythromycin Itching and Rash   Neosporin [Neomycin-Polymyxin B Gu] Itching and Rash   Prior to Admission medications   Medication Sig Start Date End Date Taking? Authorizing Provider  busPIRone (BUSPAR) 10 MG tablet TAKE 1 TABLET BY MOUTH EVERYDAY AT BEDTIME 10/04/19  Yes Rutherford Guys, MD  chlorthalidone (HYGROTON) 25 MG tablet TAKE 1 TABLET BY MOUTH EVERY DAY 12/07/19  Yes Rutherford Guys, MD  citalopram (CELEXA) 40 MG tablet TAKE 1 TABLET BY MOUTH EVERY DAY 10/06/19  Yes Rutherford Guys, MD  ibuprofen (ADVIL) 800 MG tablet Take 1 tablet (800 mg total) by mouth every 8 (eight) hours as needed. 07/25/19  Yes Cornett, [name], MD  Incontinence Supply Disposable (DEPEND EASY FIT UNDERGARMENTS) MISC Use daily as directed. Change several times daily as needed. 06/29/18  Yes Rutherford Guys, MD  lisdexamfetamine (VYVANSE) 50 MG capsule Take 1 capsule (50 mg total) by mouth daily. Ok to fill 30 days after signature 10/04/19  Yes Rutherford Guys, MD  lisdexamfetamine (VYVANSE) 50 MG capsule Take 1 capsule (50 mg total) by mouth daily. Ok to fill 60 days after signature 10/04/19  Yes Rutherford Guys, MD  losartan (COZAAR) 50 MG tablet TAKE 1 TABLET BY MOUTH EVERY DAY 12/07/19  Yes Rutherford Guys, MD  Multiple Vitamin (MULTIVITAMIN WITH MINERALS) TABS tablet Take 1 tablet by mouth daily.   Yes [provider]  oxyCODONE (OXY IR/ROXICODONE) 5 MG immediate release tablet Take 1 tablet (5 mg total) by mouth every 6 (six) hours as needed for severe pain. 07/25/19  Yes Cornett, [name], MD  valACYclovir (VALTREX) 500 MG tablet Take 2 tablets by mouth at acute onset for outbreak, followed by 1 tablet once a day x 5 days 12/12/18  Yes Rutherford Guys, MD  Vitamin D, Ergocalciferol, (DRISDOL) 1.25 MG (50000 UNIT) CAPS capsule TAKE 1 CAPSULE BY MOUTH  EVERY 7 DAYS 07/04/19  Yes Rutherford Guys, MD  lisdexamfetamine (VYVANSE) 50 MG capsule Take 1 capsule (50 mg total) by mouth daily before breakfast. 10/04/19 11/03/19  Rutherford Guys, MD     Positive ROS: Otherwise negative  All other systems have been reviewed and were otherwise negative  with the exception of those mentioned in the HPI and as above.  Physical Exam: Constitutional: Alert, well-appearing, no acute distress Ears: External ears without lesions or tenderness.  Left ear canal and left TM are normal.  Right ear canal reveals minimal cerumen that was removed with a curette and forceps.  The ear canal was otherwise dry.  The right TM is lateralized.  But no evidence of cholesteatoma on microscopic exam or on review of CT scan. Nasal: External nose without lesions. Clear nasal passages Oral: Lips and gums without lesions. Tongue and palate mucosa without lesions. Posterior oropharynx clear. Neck: No palpable adenopathy or masses.  Right parotid gland is a slightly more prominent than the left but no palpable masses or tenderness. Respiratory: Breathing comfortably  Skin: No facial/neck lesions or rash noted.  Cerumen impaction removal  Date/Time: 12/17/2019 6:11 PM Performed by: Rozetta Nunnery, MD Authorized by: Rozetta Nunnery, MD   Consent:    Consent obtained:  Verbal   Consent given by:  Patient   Risks discussed:  Pain and bleeding Procedure details:    Location:  R ear Post-procedure details:    Inspection:  TM intact and canal normal   Hearing quality:  Improved   Patient tolerance of procedure:  Tolerated well, no immediate complications Comments:     Right ear canal is clear with no drainage or signs of infection.  She has had previous surgery with scar tissue and a slightly lateralized right TM.    Assessment: Status post previous history of right ear cholesteatoma status post previous surgery with right ear mixed hearing loss. Normal parotid  gland on review of CT scan.  Plan: She will follow up on annual basis for recheck and cleaning the right ear canal or as needed. Recommended obtaining a hearing aid for the right ear as she has moderate to severe mixed loss in the right.  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   Radene Journey, MD

## 2019-12-20 ENCOUNTER — Encounter (INDEPENDENT_AMBULATORY_CARE_PROVIDER_SITE_OTHER): Payer: Self-pay

## 2019-12-27 ENCOUNTER — Ambulatory Visit: Payer: BC Managed Care – PPO | Attending: Family

## 2019-12-27 DIAGNOSIS — Z23 Encounter for immunization: Secondary | ICD-10-CM

## 2020-01-09 NOTE — Progress Notes (Signed)
   Covid-19 Vaccination Clinic  Name:  Catherine Cole    MRN: [id number] DOB: 05-24-1955  01/09/2020  [name] was observed post Covid-19 immunization for 15 minutes without incident. She was provided with Vaccine Information Sheet and instruction to access the V-Safe system.   [name] was instructed to call 911 with any severe reactions post vaccine: Marland Kitchen Difficulty breathing  . Swelling of face and throat  . A fast heartbeat  . A bad rash all over body  . Dizziness and weakness   Immunizations Administered    Name Date Dose VIS Date Route   Pfizer COVID-19 Vaccine 12/27/2019  9:00 AM 0.3 mL 06/13/2018 Intramuscular   Manufacturer: [place]   Lot: [id number]   [place]: [id number]

## 2020-01-11 ENCOUNTER — Encounter: Payer: Self-pay | Admitting: Family Medicine

## 2020-01-11 ENCOUNTER — Ambulatory Visit (INDEPENDENT_AMBULATORY_CARE_PROVIDER_SITE_OTHER): Payer: BC Managed Care – PPO | Admitting: Family Medicine

## 2020-01-11 ENCOUNTER — Other Ambulatory Visit: Payer: Self-pay

## 2020-01-11 VITALS — BP 128/64 | HR 91 | Temp 98.2°F | Ht 65.0 in | Wt 185.8 lb

## 2020-01-11 DIAGNOSIS — E782 Mixed hyperlipidemia: Secondary | ICD-10-CM | POA: Diagnosis not present

## 2020-01-11 DIAGNOSIS — F900 Attention-deficit hyperactivity disorder, predominantly inattentive type: Secondary | ICD-10-CM

## 2020-01-11 DIAGNOSIS — E559 Vitamin D deficiency, unspecified: Secondary | ICD-10-CM

## 2020-01-11 DIAGNOSIS — I10 Essential (primary) hypertension: Secondary | ICD-10-CM

## 2020-01-11 DIAGNOSIS — F418 Other specified anxiety disorders: Secondary | ICD-10-CM

## 2020-01-11 DIAGNOSIS — E041 Nontoxic single thyroid nodule: Secondary | ICD-10-CM

## 2020-01-11 MED ORDER — CITALOPRAM HYDROBROMIDE 40 MG PO TABS: 40.0000 mg | ORAL_TABLET | Freq: Every day | ORAL | 1 refills | Status: DC

## 2020-01-11 MED ORDER — VALACYCLOVIR HCL 500 MG PO TABS: ORAL_TABLET | ORAL | 3 refills | Status: AC

## 2020-01-11 MED ORDER — LISDEXAMFETAMINE DIMESYLATE 50 MG PO CAPS: 50.0000 mg | ORAL_CAPSULE | Freq: Every day | ORAL | 0 refills | Status: DC

## 2020-01-11 MED ORDER — LOSARTAN POTASSIUM 50 MG PO TABS: 50.0000 mg | ORAL_TABLET | Freq: Every day | ORAL | 1 refills | Status: DC

## 2020-01-11 MED ORDER — CHLORTHALIDONE 25 MG PO TABS: 25.0000 mg | ORAL_TABLET | Freq: Every day | ORAL | 1 refills | Status: DC

## 2020-01-11 MED ORDER — BUSPIRONE HCL 10 MG PO TABS: ORAL_TABLET | ORAL | 1 refills | Status: DC

## 2020-01-11 NOTE — Progress Notes (Signed)
9/24/20211:52 PM  Catherine Cole [birth year], 64 y.o., female [id number]  Chief Complaint  Patient presents with  . Medication Management    f/u   . question based on CT scan    done on Aug 27. from Dr. Darcus Pester office    HPI:   Patient is a 64 y.o. female with past medical history significant for ADD, HTN, depression with anxiety, vitamin D deficiencyand urinaryincontinence, right breast cancer who presents today for routine followup  Last OV June 2021 - no changes  She is overall doing well She has not been taking vitamin D supplement Feels that ADD, anxiety and depression are well controlled She has developed coping skills She is working on improving diet She lost a friend last week pmp reviewed Needs refill of valtrex, place on hold, previous rx expired Saw dr Lucia Gaskins Dec 17 2019 - normal parotid gland on ct scan, right ear mixed hearing loss, recommend hearing aide, incidental 30mm hypodense thyroid nodule  Depression screen [facility] 2/9 01/11/2020 10/04/2019 09/14/2019  Decreased Interest 0 0 0  Down, Depressed, Hopeless 0 0 0  PHQ - 2 Score 0 0 0  Altered sleeping - - -  Tired, decreased energy - - -  Change in appetite - - -  Feeling bad or failure about yourself  - - -  Trouble concentrating - - -  Moving slowly or fidgety/restless - - -  Suicidal thoughts - - -  PHQ-9 Score - - -  Difficult doing work/chores - - -  Some recent data might be hidden    Fall Risk  01/11/2020 12/03/2019 09/14/2019 05/17/2019 03/19/2019  Falls in the past year? 0 0 0 0 0  Number falls in past yr: 0 0 0 0 0  Injury with Fall? 0 1 0 0 0  Risk for fall due to : - Medication side effect;Mental status change - - -  Follow up Falls evaluation completed - - - -     Allergies  Allergen Reactions  . Latex Hives  . Demerol Nausea And Vomiting  . Codeine Nausea And Vomiting  . Erythromycin Itching and Rash  . Neosporin [Neomycin-Polymyxin B Gu] Itching and Rash    Prior to  Admission medications   Medication Sig Start Date End Date Taking? Authorizing Provider  busPIRone (BUSPAR) 10 MG tablet TAKE 1 TABLET BY MOUTH EVERYDAY AT BEDTIME 10/04/19  Yes Rutherford Guys, MD  chlorthalidone (HYGROTON) 25 MG tablet TAKE 1 TABLET BY MOUTH EVERY DAY 12/07/19  Yes Rutherford Guys, MD  citalopram (CELEXA) 40 MG tablet TAKE 1 TABLET BY MOUTH EVERY DAY 10/06/19  Yes Rutherford Guys, MD  Incontinence Supply Disposable (DEPEND EASY FIT UNDERGARMENTS) MISC Use daily as directed. Change several times daily as needed. 06/29/18  Yes Rutherford Guys, MD  lisdexamfetamine (VYVANSE) 50 MG capsule Take 1 capsule (50 mg total) by mouth daily. Ok to fill 30 days after signature 10/04/19  Yes Rutherford Guys, MD  lisdexamfetamine (VYVANSE) 50 MG capsule Take 1 capsule (50 mg total) by mouth daily. Ok to fill 60 days after signature 10/04/19  Yes Rutherford Guys, MD  losartan (COZAAR) 50 MG tablet TAKE 1 TABLET BY MOUTH EVERY DAY 12/07/19  Yes Rutherford Guys, MD  Multiple Vitamin (MULTIVITAMIN WITH MINERALS) TABS tablet Take 1 tablet by mouth daily.   Yes [provider]  valACYclovir (VALTREX) 500 MG tablet Take 2 tablets by mouth at acute onset for outbreak, followed by 1 tablet once  a day x 5 days 12/12/18  Yes Rutherford Guys, MD  Vitamin D, Ergocalciferol, (DRISDOL) 1.25 MG (50000 UNIT) CAPS capsule TAKE 1 CAPSULE BY MOUTH EVERY 7 DAYS Patient not taking: Reported on 01/11/2020 07/04/19   Rutherford Guys, MD    Past Medical History:  Diagnosis Date  . Adult ADHD   . Anemia   . Anxiety   . Complication of anesthesia   . Depression   . Family history of breast cancer   . Family history of colon cancer   . Family history of kidney cancer   . Family history of melanoma   . Family history of ovarian cancer   . Hypertension   . IBS (irritable bowel syndrome)   . PONV (postoperative nausea and vomiting)     Past Surgical History:  Procedure Laterality Date  . APPENDECTOMY      12 yrs  . CHOLECYSTECTOMY  1995  . CHOLESTEATOMA EXCISION     13 and 22 yrs  . FRACTURE SURGERY    . [place]  . LESION DESTRUCTION N/A 12/03/2015   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;  Surgeon: [name], DO;  Location: [facility];  Service: Plastics;  Laterality: N/A;  . MASTECTOMY W/ SENTINEL NODE BIOPSY Bilateral 07/25/2019   Procedure: BILATERAL MASTECTOMY WITH RIGHT SENTINEL LYMPH NODE BIOPSY, LEFT RISK REDUCING MASTECTOMY;  Surgeon: [name], MD;  Location: [facility];  Service: General;  Laterality: Bilateral;  PEC BLOCK  . TONSILLECTOMY     15 yrs    Social History   Tobacco Use  . Smoking status: Former Smoker    Packs/day: 1.00    Years: 7.00    Pack years: 7.00  . Smokeless tobacco: Never Used  . Tobacco comment: some day smoker when she smoked  Substance Use Topics  . Alcohol use: No    Family History  Problem Relation Age of Onset  . Heart disease Mother 42       Heart attack  . Colon polyps Mother   . Melanoma Mother   . Alcohol abuse Father   . Diabetes Father   . Colon polyps Sister   . Colon polyps Brother   . Colon polyps Son   . Brain cancer Maternal Aunt        pituitary cancer over 50  . Lung cancer Maternal Uncle   . Colon polyps Sister   . Melanoma Maternal Aunt   . Ovarian cancer Maternal Aunt   . Breast cancer Cousin        maternal first cousin  . Kidney cancer Cousin        mat first cousin  . Colon cancer Cousin        mat first cousin    Review of Systems  Constitutional: Negative for chills and fever.  Respiratory: Negative for cough and shortness of breath.   Cardiovascular: Negative for chest pain, palpitations and leg swelling.  Gastrointestinal: Negative for abdominal pain, nausea and vomiting.     OBJECTIVE:  Today's Vitals   01/11/20 1333 01/11/20 1348  BP: (!) 150/83 128/64  Pulse: 91   Temp: 98.2 F (36.8 C)     TempSrc: Temporal   SpO2: 93%   Weight: 185 lb 12.8 oz (84.3 kg)   Height: 5\' 5"  (1.651 m)    Body mass index is 30.92 kg/m.   Physical Exam Vitals and nursing note reviewed.  Constitutional:  Appearance: She is well-developed.  HENT:     Head: Normocephalic and atraumatic.     Mouth/Throat:     Pharynx: No oropharyngeal exudate.  Eyes:     General: No scleral icterus.    Extraocular Movements: Extraocular movements intact.     Conjunctiva/sclera: Conjunctivae normal.     Pupils: Pupils are equal, round, and reactive to light.  Cardiovascular:     Rate and Rhythm: Normal rate and regular rhythm.     Heart sounds: Normal heart sounds. No murmur heard.  No friction rub. No gallop.   Pulmonary:     Effort: Pulmonary effort is normal.     Breath sounds: Normal breath sounds. No wheezing, rhonchi or rales.  Musculoskeletal:     Cervical back: Neck supple.  Skin:    General: Skin is warm and dry.  Neurological:     Mental Status: She is alert and oriented to person, place, and time.     No results found for this or any previous visit (from the past 24 hour(s)).  No results found.   ASSESSMENT and PLAN  1. Essential hypertension, benign Controlled. Continue current regime.  - Comprehensive metabolic panel  2. Mixed dyslipidemia Cont working on LFM, recheck labs at next OV  3. Depression with anxiety Controlled. Continue current regime.   4. Attention deficit hyperactivity disorder (ADHD), predominantly inattentive type Controlled. Continue current regime. pmp reviewed - ToxASSURE Select 13 (MW), Urine - lisdexamfetamine (VYVANSE) 50 MG capsule; Take 1 capsule (50 mg total) by mouth daily. Ok to fill 30 days after signature - lisdexamfetamine (VYVANSE) 50 MG capsule; Take 1 capsule (50 mg total) by mouth daily before breakfast.  5. Vitamin D deficiency Start oral supplementation, recheck labs at next OV  6. Thyroid nodule Repeat US in 6-12 months to  ensure stability - TSH  Other orders - busPIRone (BUSPAR) 10 MG tablet; TAKE 1 TABLET BY MOUTH EVERYDAY AT BEDTIME - chlorthalidone (HYGROTON) 25 MG tablet; Take 1 tablet (25 mg total) by mouth daily. - citalopram (CELEXA) 40 MG tablet; Take 1 tablet (40 mg total) by mouth daily. - lisdexamfetamine (VYVANSE) 50 MG capsule; Take 1 capsule (50 mg total) by mouth daily. Ok to fill 60 days after signature - losartan (COZAAR) 50 MG tablet; Take 1 tablet (50 mg total) by mouth daily. - valACYclovir (VALTREX) 500 MG tablet; Take 2 tablets by mouth at acute onset for outbreak, followed by 1 tablet once a day x 5 days  Return in about 3 months (around 04/11/2020) for TOC - DR GREENE.    Rutherford Guys, MD Primary [facility] at [place], [place] [address] Ph.  [phone] Fax [phone]

## 2020-01-11 NOTE — Patient Instructions (Signed)
° ° ° °  If you have lab work done today you will be contacted with your lab results within the next 2 weeks.  If you have not heard from us then please contact us. The fastest way to get your results is to register for My Chart. ° ° °IF you received an x-ray today, you will receive an invoice from [facility]. Please contact [facility] at [phone] with questions or concerns regarding your invoice.  ° °IF you received labwork today, you will receive an invoice from [facility]. Please contact [facility] at [phone] with questions or concerns regarding your invoice.  ° °Our billing staff will not be able to assist you with questions regarding bills from these companies. ° °You will be contacted with the lab results as soon as they are available. The fastest way to get your results is to activate your My Chart account. Instructions are located on the last page of this paperwork. If you have not heard from us regarding the results in 2 weeks, please contact this office. °  ° ° ° °

## 2020-01-12 LAB — COMPREHENSIVE METABOLIC PANEL
ALT: 28 IU/L (ref 0–32)
AST: 21 IU/L (ref 0–40)
Albumin/Globulin Ratio: 2 (ref 1.2–2.2)
Albumin: 4.7 g/dL (ref 3.8–4.8)
Alkaline Phosphatase: 85 IU/L (ref 44–121)
BUN/Creatinine Ratio: 13 (ref 12–28)
BUN: 11 mg/dL (ref 8–27)
Bilirubin Total: 0.3 mg/dL (ref 0.0–1.2)
CO2: 27 mmol/L (ref 20–29)
Calcium: 9.7 mg/dL (ref 8.7–10.3)
Chloride: 97 mmol/L (ref 96–106)
Creatinine, Ser: 0.82 mg/dL (ref 0.57–1.00)
GFR calc Af Amer: 88 mL/min/{1.73_m2} (ref >59)
GFR calc non Af Amer: 76 mL/min/{1.73_m2} (ref >59)
Globulin, Total: 2.4 g/dL (ref 1.5–4.5)
Glucose: 97 mg/dL (ref 65–99)
Potassium: 3.3 mmol/L — ABNORMAL LOW (ref 3.5–5.2)
Sodium: 140 mmol/L (ref 134–144)
Total Protein: 7.1 g/dL (ref 6.0–8.5)

## 2020-01-12 LAB — TSH: TSH: 2.13 u[IU]/mL (ref 0.450–4.500)

## 2020-01-16 LAB — TOXASSURE SELECT 13 (MW), URINE

## 2020-01-18 ENCOUNTER — Other Ambulatory Visit: Payer: Self-pay | Admitting: Family Medicine

## 2020-01-21 ENCOUNTER — Other Ambulatory Visit: Payer: BC Managed Care – PPO

## 2020-03-04 ENCOUNTER — Encounter: Payer: Self-pay | Admitting: Family Medicine

## 2020-04-02 ENCOUNTER — Other Ambulatory Visit: Payer: Self-pay

## 2020-04-02 ENCOUNTER — Encounter: Payer: Self-pay | Admitting: Family Medicine

## 2020-04-02 ENCOUNTER — Ambulatory Visit (INDEPENDENT_AMBULATORY_CARE_PROVIDER_SITE_OTHER): Payer: BC Managed Care – PPO | Admitting: Family Medicine

## 2020-04-02 VITALS — BP 132/80 | HR 99 | Temp 97.8°F | Ht 65.0 in | Wt 188.0 lb

## 2020-04-02 DIAGNOSIS — F900 Attention-deficit hyperactivity disorder, predominantly inattentive type: Secondary | ICD-10-CM | POA: Diagnosis not present

## 2020-04-02 DIAGNOSIS — F418 Other specified anxiety disorders: Secondary | ICD-10-CM | POA: Diagnosis not present

## 2020-04-02 DIAGNOSIS — Z23 Encounter for immunization: Secondary | ICD-10-CM

## 2020-04-02 DIAGNOSIS — I890 Lymphedema, not elsewhere classified: Secondary | ICD-10-CM

## 2020-04-02 DIAGNOSIS — M8580 Other specified disorders of bone density and structure, unspecified site: Secondary | ICD-10-CM

## 2020-04-02 DIAGNOSIS — E876 Hypokalemia: Secondary | ICD-10-CM

## 2020-04-02 DIAGNOSIS — Z8262 Family history of osteoporosis: Secondary | ICD-10-CM

## 2020-04-02 DIAGNOSIS — R7989 Other specified abnormal findings of blood chemistry: Secondary | ICD-10-CM

## 2020-04-02 DIAGNOSIS — I10 Essential (primary) hypertension: Secondary | ICD-10-CM

## 2020-04-02 MED ORDER — LISDEXAMFETAMINE DIMESYLATE 50 MG PO CAPS: 50.0000 mg | ORAL_CAPSULE | Freq: Every day | ORAL | 0 refills | Status: DC

## 2020-04-02 MED ORDER — BUSPIRONE HCL 10 MG PO TABS: 10.0000 mg | ORAL_TABLET | Freq: Two times a day (BID) | ORAL | 1 refills | Status: DC | PRN

## 2020-04-02 NOTE — Progress Notes (Signed)
Subjective:  Patient ID: Catherine Cole, female    DOB: 01-May-1955  Age: 64 y.o. MRN: [id number]  CC:  Chief Complaint  Patient presents with  . Follow-up    On ADHD, hypertension, and anxiety and depression. PT reports no issues with ADHD since last OV. PT reports no issues with BP since last OV. Pt reports no physical symptoms of hypertension since last OV. PT reports she has had some anxiety attacks since last OV, but no issues with depression since last OV.    HPI Catherine Cole presents for   Medication refills.  Previous primary care provider Dr. Pamella Pert.  Last office visit in September.  Hypertension: With lymphedema,upper extremities, uses compression stockings.   Chlorthalidone 25 mg daily, losartan 50 mg daily. Potassium borderline at 3.3 on 9/24. No current supplement.  She has been incorporating potassium rich foods.  Home readings: 130-140/70's.  No new side effects.  BP Readings from Last 3 Encounters:  04/02/20 132/80  01/11/20 128/64  12/03/19 136/84   Lab Results  Component Value Date   CREATININE 0.82 01/11/2020    Depression with anxiety: Some recent flares of anxiety attacks.  Celexa 40 mg daily, BuSpar 10 mg nightly rx - taking in am. Prior 20mg  caused sluggish feeling. Tolerating am dose. Only takes additional dose if increased anxiety during the day. Works well with this dosing only few times per month.   GAD 7 : Generalized Anxiety Score 04/02/2020 12/12/2018 09/29/2018 09/29/2018  Nervous, Anxious, on Edge 1 1 3 3   Control/stop worrying 0 0 3 3  Worry too much - different things 0 0 3 3  Trouble relaxing 0 0 3 3  Restless 0 0 0 0  Easily annoyed or irritable 0 0 1 1  Afraid - awful might happen 0 0 0 0  Total GAD 7 Score 1 1 13 13   Anxiety Difficulty - Not difficult at all - -     Depression screen [facility] 2/9 04/02/2020 01/11/2020 10/04/2019 09/14/2019 05/17/2019  Decreased Interest 0 0 0 0 0  Down, Depressed, Hopeless 0 0 0 0 0  PHQ  - 2 Score 0 0 0 0 0  Altered sleeping 1 - - - -  Tired, decreased energy 2 - - - -  Change in appetite 0 - - - -  Feeling bad or failure about yourself  0 - - - -  Trouble concentrating 0 - - - -  Moving slowly or fidgety/restless 0 - - - -  Suicidal thoughts 0 - - - -  PHQ-9 Score 3 - - - -  Difficult doing work/chores - - - - -  Some recent data might be hidden   ADHD: Treated with Vyvanse 50 mg daily. Controlled substance database reviewed.  Last Vyvanse prescription filled December 5th for #30.  Previous prescriptions on November 1, September 30, September 3. Urine drug screen September 24 consistent with current medication. Working well for symptoms. Eating breakfast.  No new insomnia or side effects. No palpitations. Occasionally skips meds if not needed.    Immunization review: Requests Shingrix, flu vaccine. shingrix out of stock here currently. Possibly next visit.   Immunization History  Administered Date(s) Administered  . Influenza,inj,Quad PF,6+ Mos 12/15/2016, 04/04/2018, 12/12/2018  . Influenza-Unspecified 02/14/2014, 02/11/2016  . PFIZER SARS-COV-2 Vaccination 12/27/2019  . Tdap 03/26/2014   Bone density: Family history of osteoporosis with her mother.  Personal hx of osteopenia.  History of breast cancer, double mastectomy in April.  Had  bone density testing 55yrs ago at [facility].  Low serum vitamin D in June, has been treated with 50,000 units/week previously. No rx dose in few months. Has 2000iu supplement at home - not on recently.  No calcium supplement.   Last vitamin D Lab Results  Component Value Date   VD25OH 21.5 (L) 10/01/2019    History Patient Active Problem List   Diagnosis Date Noted  . Genetic testing 08/03/2019  . Breast cancer, right ([facility]) 07/25/2019  . 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 Family history of ovarian cancer   . Family history of breast cancer   . Family history of colon cancer   . Family  history of kidney cancer   . Family history of melanoma   . Vitamin D deficiency 12/12/2018  . Essential hypertension, benign 10/15/2015  . Kidney stone 05/02/2015  . Palpitations 02/08/2015  . Atypical chest pain 02/08/2015  . Exertional dyspnea 02/08/2015  . Lower extremity edema 02/08/2015  . Mixed dyslipidemia 03/26/2014  . 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/03/2013  . Depression with anxiety 04/26/2012  . ADHD (attention deficit hyperactivity disorder) 04/26/2012   Past Medical History:  Diagnosis Date  . Adult ADHD   . Anemia   . Anxiety   . Complication of anesthesia   . Depression   . Family history of breast cancer   . Family history of colon cancer   . Family history of kidney cancer   . Family history of melanoma   . Family history of ovarian cancer   . Hypertension   . IBS (irritable bowel syndrome)   . PONV (postoperative nausea and vomiting)    Past Surgical History:  Procedure Laterality Date  . APPENDECTOMY     12 yrs  . CHOLECYSTECTOMY  1995  . CHOLESTEATOMA EXCISION     13 and 22 yrs  . FRACTURE SURGERY    . [place]  . LESION DESTRUCTION N/A 12/03/2015   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;  Surgeon: [name], DO;  Location: [facility];  Service: Plastics;  Laterality: N/A;  . MASTECTOMY W/ SENTINEL NODE BIOPSY Bilateral 07/25/2019   Procedure: BILATERAL MASTECTOMY WITH RIGHT SENTINEL LYMPH NODE BIOPSY, LEFT RISK REDUCING MASTECTOMY;  Surgeon: [name], MD;  Location: [facility];  Service: General;  Laterality: Bilateral;  PEC BLOCK  . TONSILLECTOMY     15 yrs   Allergies  Allergen Reactions  . Latex Hives  . Demerol Nausea And Vomiting  . Codeine Nausea And Vomiting  . Erythromycin Itching and Rash  . Neosporin [Neomycin-Polymyxin B Gu] Itching and Rash   Prior to Admission medications   Medication Sig Start Date End Date Taking? Authorizing  Provider  busPIRone (BUSPAR) 10 MG tablet TAKE 1 TABLET BY MOUTH EVERYDAY AT BEDTIME 01/11/20  Yes [name], [name], MD  chlorthalidone (HYGROTON) 25 MG tablet Take 1 tablet (25 mg total) by mouth daily. 01/11/20  Yes [name], [name], MD  citalopram (CELEXA) 40 MG tablet Take 1 tablet (40 mg total) by mouth daily. 01/11/20  Yes [name], [name], MD  Incontinence Supply Disposable (DEPEND EASY FIT UNDERGARMENTS) MISC Use daily as directed. Change several times daily as needed. 06/29/18  Yes [name], [name], MD  lisdexamfetamine (VYVANSE) 50 MG capsule Take 1 capsule (50 mg total) by mouth daily. Ok to fill 30 days after signature 01/11/20  Yes [name], [place]  M, MD  lisdexamfetamine (VYVANSE) 50 MG capsule Take 1 capsule (50 mg total) by mouth daily. Ok to fill 60 days after signature 01/11/20  Yes [name], [name], MD  losartan (COZAAR) 50 MG tablet Take 1 tablet (50 mg total) by mouth daily. 01/11/20  Yes [name], [name], MD  Multiple Vitamin (MULTIVITAMIN WITH MINERALS) TABS tablet Take 1 tablet by mouth daily.   Yes [provider]  valACYclovir (VALTREX) 500 MG tablet Take 2 tablets by mouth at acute onset for outbreak, followed by 1 tablet once a day x 5 days 01/11/20  Yes [name], [name], MD  Vitamin D, Ergocalciferol, (DRISDOL) 1.25 MG (50000 UNIT) CAPS capsule TAKE 1 CAPSULE BY MOUTH EVERY 7 DAYS 07/04/19  Yes [name], [name], MD  lisdexamfetamine (VYVANSE) 50 MG capsule Take 1 capsule (50 mg total) by mouth daily before breakfast. 01/11/20 02/10/20  [name], [name], MD   Social History   Socioeconomic History  . Marital status: Widowed    Spouse name: Not on file  . Number of children: Not on file  . Years of education: Not on file  . Highest education level: Not on file  Occupational History  . Not on file  Tobacco Use  . Smoking status: Former Smoker    Packs/day: 1.00    Years: 7.00    Pack years: 7.00  . Smokeless tobacco:  Never Used  . Tobacco comment: some day smoker when she smoked  Substance and Sexual Activity  . Alcohol use: No  . Drug use: No  . Sexual activity: Never  Other Topics Concern  . Not on file  Social History Narrative  . Not on file   Social Determinants of Health   Financial Resource Strain: Not on file  Food Insecurity: Not on file  Transportation Needs: Not on file  Physical Activity: Not on file  Stress: Not on file  Social Connections: Not on file  Intimate Partner Violence: Not on file    Review of Systems   Objective:   Vitals:   04/02/20 1506 04/02/20 1511  BP: (!) 157/87 132/80  Pulse: 99   Temp: 97.8 F (36.6 C)   TempSrc: Temporal   SpO2: 95%   Weight: 188 lb (85.3 kg)   Height: 5\' 5"  (1.651 m)      Physical Exam Vitals reviewed.  Constitutional:      Appearance: She is well-developed and well-nourished.  HENT:     Head: Normocephalic and atraumatic.  Eyes:     Extraocular Movements: EOM normal.     Conjunctiva/sclera: Conjunctivae normal.     Pupils: Pupils are equal, round, and reactive to light.  Neck:     Vascular: No carotid bruit.  Cardiovascular:     Rate and Rhythm: Normal rate and regular rhythm.     Pulses: Intact distal pulses.     Heart sounds: Normal heart sounds.  Pulmonary:     Effort: Pulmonary effort is normal.     Breath sounds: Normal breath sounds.  Abdominal:     Palpations: Abdomen is soft. There is no pulsatile mass.     Tenderness: There is no abdominal tenderness.  Skin:    General: Skin is warm and dry.  Neurological:     General: No focal deficit present.     Mental Status: She is alert and oriented to person, place, and time.  Psychiatric:        Mood and Affect: Mood and affect  and mood normal.        Behavior: Behavior normal.        Thought Content: Thought content normal.       38 minutes spent during visit, greater than 50% counseling and assimilation of information, chart review, and discussion of  plan.   Assessment & Plan:  Catherine Cole is a 64 y.o. female . Depression with anxiety - Plan: busPIRone (BUSPAR) 10 MG tablet  -Overall stable with additional dose of BuSpar on occasion, continue Celexa, refill BuSpar with higher quantity for intermittent additional dose needed.  Need for vaccination - Plan: Flu Vaccine QUAD 36+ mos IM  Hypokalemia - Plan: Basic metabolic panel  -Repeat BMP, continue potassium rich foods.  Attention deficit hyperactivity disorder (ADHD), predominantly inattentive type - Plan: lisdexamfetamine (VYVANSE) 50 MG capsule, lisdexamfetamine (VYVANSE) 50 MG capsule, lisdexamfetamine (VYVANSE) 50 MG capsule  -Stable control of symptoms and appropriate UDS, PDMP review as above.  Refill ordered for her January prescription, and additional 2 refills.  Episodic drug holiday okay.  Essential hypertension, benign lymphedema  -Overall stable, continue same regimen.  Low serum vitamin D - Plan: VITAMIN D 25 Hydroxy (Vit-D Deficiency, Fractures) Osteopenia, unspecified location - Plan: DG Bone Density Family history of osteoporosis - Plan: DG Bone Density  -Schedule updated bone density testing with history of osteopenia.  Check vitamin D to decide on prescription strength dosing.  Vitamin D and calcium daily recommendations discussed.  Meds ordered this encounter  Medications  . lisdexamfetamine (VYVANSE) 50 MG capsule    Sig: Take 1 capsule (50 mg total) by mouth daily. Ok to fill in 30 days    Dispense:  30 capsule    Refill:  0  . lisdexamfetamine (VYVANSE) 50 MG capsule    Sig: Take 1 capsule (50 mg total) by mouth daily. Ok to fill in 60 days    Dispense:  30 capsule    Refill:  0  . lisdexamfetamine (VYVANSE) 50 MG capsule    Sig: Take 1 capsule (50 mg total) by mouth daily before breakfast. Ok to fill 04/21/20    Dispense:  30 capsule    Refill:  0  . busPIRone (BUSPAR) 10 MG tablet    Sig: Take 1 tablet (10 mg total) by mouth 2 (two) times  daily as needed.    Dispense:  135 tablet    Refill:  1   Patient Instructions   Ok to continue same dosing of buspirone. vyvnase refills sent for next fill in January.  I will check potassium. Continue potassium rich foods.  Depending on vitamin d levels, can discuss over the counter or prescription. Should be on at least 1000iu per day and at least 1200-1500mg  calcium per day.  Keep follow up as planned in January. Good talking with you today.    Managing Your Hypertension Hypertension is commonly called high blood pressure. This is when the force of your blood pressing against the walls of your arteries is too strong. Arteries are blood vessels that carry blood from your heart throughout your body. Hypertension forces the heart to work harder to pump blood, and may cause the arteries to become narrow or stiff. Having untreated or uncontrolled hypertension can cause heart attack, stroke, kidney disease, and other problems. What are blood pressure readings? A blood pressure reading consists of a higher number over a lower number. Ideally, your blood pressure should be below 120/80. The first ("top") number is called the systolic pressure. It is a measure  of the pressure in your arteries as your heart beats. The second ("bottom") number is called the diastolic pressure. It is a measure of the pressure in your arteries as the heart relaxes. What does my blood pressure reading mean? Blood pressure is classified into four stages. Based on your blood pressure reading, your health care provider may use the following stages to determine what type of treatment you need, if any. Systolic pressure and diastolic pressure are measured in a unit called mm Hg. Normal  Systolic pressure: below 481.  Diastolic pressure: below 80. Elevated  Systolic pressure: 856-314.  Diastolic pressure: below 80. Hypertension stage 1  Systolic pressure: 970-263.  Diastolic pressure: 78-58. Hypertension stage  2  Systolic pressure: 850 or above.  Diastolic pressure: 90 or above. What health risks are associated with hypertension? Managing your hypertension is an important responsibility. Uncontrolled hypertension can lead to:  A heart attack.  A stroke.  A weakened blood vessel (aneurysm).  Heart failure.  Kidney damage.  Eye damage.  Metabolic syndrome.  Memory and concentration problems. What changes can I make to manage my hypertension? Hypertension can be managed by making lifestyle changes and possibly by taking medicines. Your health care provider will help you make a plan to bring your blood pressure within a normal range. Eating and drinking   Eat a diet that is high in fiber and potassium, and low in salt (sodium), added sugar, and fat. An example eating plan is called the DASH (Dietary Approaches to Stop Hypertension) diet. To eat this way: ? Eat plenty of fresh fruits and vegetables. Try to fill half of your plate at each meal with fruits and vegetables. ? Eat whole grains, such as whole wheat pasta, brown rice, or whole grain bread. Fill about one quarter of your plate with whole grains. ? Eat low-fat diary products. ? Avoid fatty cuts of meat, processed or cured meats, and poultry with skin. Fill about one quarter of your plate with lean proteins such as fish, chicken without skin, beans, eggs, and tofu. ? Avoid premade and processed foods. These tend to be higher in sodium, added sugar, and fat.  Reduce your daily sodium intake. Most people with hypertension should eat less than 1,500 mg of sodium a day.  Limit alcohol intake to no more than 1 drink a day for nonpregnant women and 2 drinks a day for men. One drink equals 12 oz of beer, 5 oz of wine, or 1 oz of hard liquor. Lifestyle  Work with your health care provider to maintain a healthy body weight, or to lose weight. Ask what an ideal weight is for you.  Get at least 30 minutes of exercise that causes your  heart to beat faster (aerobic exercise) most days of the week. Activities may include walking, swimming, or biking.  Include exercise to strengthen your muscles (resistance exercise), such as weight lifting, as part of your weekly exercise routine. Try to do these types of exercises for 30 minutes at least 3 days a week.  Do not use any products that contain nicotine or tobacco, such as cigarettes and e-cigarettes. If you need help quitting, ask your health care provider.  Control any long-term (chronic) conditions you have, such as high cholesterol or diabetes. Monitoring  Monitor your blood pressure at home as told by your health care provider. Your personal target blood pressure may vary depending on your medical conditions, your age, and other factors.  Have your blood pressure checked regularly, as often  as told by your health care provider. Working with your health care provider  Review all the medicines you take with your health care provider because there may be side effects or interactions.  Talk with your health care provider about your diet, exercise habits, and other lifestyle factors that may be contributing to hypertension.  Visit your health care provider regularly. Your health care provider can help you create and adjust your plan for managing hypertension. Will I need medicine to control my blood pressure? Your health care provider may prescribe medicine if lifestyle changes are not enough to get your blood pressure under control, and if:  Your systolic blood pressure is 130 or higher.  Your diastolic blood pressure is 80 or higher. Take medicines only as told by your health care provider. Follow the directions carefully. Blood pressure medicines must be taken as prescribed. The medicine does not work as well when you skip doses. Skipping doses also puts you at risk for problems. Contact a health care provider if:  You think you are having a reaction to medicines you have  taken.  You have repeated (recurrent) headaches.  You feel dizzy.  You have swelling in your ankles.  You have trouble with your vision. Get help right away if:  You develop a severe headache or confusion.  You have unusual weakness or numbness, or you feel faint.  You have severe pain in your chest or abdomen.  You vomit repeatedly.  You have trouble breathing. Summary  Hypertension is when the force of blood pumping through your arteries is too strong. If this condition is not controlled, it may put you at risk for serious complications.  Your personal target blood pressure may vary depending on your medical conditions, your age, and other factors. For most people, a normal blood pressure is less than 120/80.  Hypertension is managed by lifestyle changes, medicines, or both. Lifestyle changes include weight loss, eating a healthy, low-sodium diet, exercising more, and limiting alcohol. This information is not intended to replace advice given to you by your health care provider. Make sure you discuss any questions you have with your health care provider. Document Revised: 07/28/2018 Document Reviewed: 03/03/2016 Elsevier Patient Education  [employer].    If you have lab work done today you will be contacted with your lab results within the next 2 weeks.  If you have not heard from [place] then please contact us. The fastest way to get your results is to register for My Chart.   IF you received an x-ray today, you will receive an invoice from [facility]. Please contact [facility] at [phone] with questions or concerns regarding your invoice.   IF you received labwork today, you will receive an invoice from [facility]. Please contact [facility] at [phone] with questions or concerns regarding your invoice.   Our billing staff will not be able to assist you with questions regarding bills from these companies.  You will be contacted with the lab  results as soon as they are available. The fastest way to get your results is to activate your My Chart account. Instructions are located on the last page of this paperwork. If you have not heard from [place] regarding the results in 2 weeks, please contact this office.         Signed, Merri Ray, MD [facility]

## 2020-04-02 NOTE — Patient Instructions (Addendum)
Ok to continue same dosing of buspirone. vyvnase refills sent for next fill in January.  I will check potassium. Continue potassium rich foods.  Depending on vitamin d levels, can discuss over the counter or prescription. Should be on at least 1000iu per day and at least 1200-1500mg  calcium per day.  Keep follow up as planned in January. Good talking with you today.    Managing Your Hypertension Hypertension is commonly called high blood pressure. This is when the force of your blood pressing against the walls of your arteries is too strong. Arteries are blood vessels that carry blood from your heart throughout your body. Hypertension forces the heart to work harder to pump blood, and may cause the arteries to become narrow or stiff. Having untreated or uncontrolled hypertension can cause heart attack, stroke, kidney disease, and other problems. What are blood pressure readings? A blood pressure reading consists of a higher number over a lower number. Ideally, your blood pressure should be below 120/80. The first ("top") number is called the systolic pressure. It is a measure of the pressure in your arteries as your heart beats. The second ("bottom") number is called the diastolic pressure. It is a measure of the pressure in your arteries as the heart relaxes. What does my blood pressure reading mean? Blood pressure is classified into four stages. Based on your blood pressure reading, your health care provider may use the following stages to determine what type of treatment you need, if any. Systolic pressure and diastolic pressure are measured in a unit called mm Hg. Normal  Systolic pressure: below 998.  Diastolic pressure: below 80. Elevated  Systolic pressure: 338-250.  Diastolic pressure: below 80. Hypertension stage 1  Systolic pressure: 539-767.  Diastolic pressure: 34-19. Hypertension stage 2  Systolic pressure: 379 or above.  Diastolic pressure: 90 or above. What health  risks are associated with hypertension? Managing your hypertension is an important responsibility. Uncontrolled hypertension can lead to:  A heart attack.  A stroke.  A weakened blood vessel (aneurysm).  Heart failure.  Kidney damage.  Eye damage.  Metabolic syndrome.  Memory and concentration problems. What changes can I make to manage my hypertension? Hypertension can be managed by making lifestyle changes and possibly by taking medicines. Your health care provider will help you make a plan to bring your blood pressure within a normal range. Eating and drinking   Eat a diet that is high in fiber and potassium, and low in salt (sodium), added sugar, and fat. An example eating plan is called the DASH (Dietary Approaches to Stop Hypertension) diet. To eat this way: ? Eat plenty of fresh fruits and vegetables. Try to fill half of your plate at each meal with fruits and vegetables. ? Eat whole grains, such as whole wheat pasta, brown rice, or whole grain bread. Fill about one quarter of your plate with whole grains. ? Eat low-fat diary products. ? Avoid fatty cuts of meat, processed or cured meats, and poultry with skin. Fill about one quarter of your plate with lean proteins such as fish, chicken without skin, beans, eggs, and tofu. ? Avoid premade and processed foods. These tend to be higher in sodium, added sugar, and fat.  Reduce your daily sodium intake. Most people with hypertension should eat less than 1,500 mg of sodium a day.  Limit alcohol intake to no more than 1 drink a day for nonpregnant women and 2 drinks a day for men. One drink equals 12 oz of beer,  5 oz of wine, or 1 oz of hard liquor. Lifestyle  Work with your health care provider to maintain a healthy body weight, or to lose weight. Ask what an ideal weight is for you.  Get at least 30 minutes of exercise that causes your heart to beat faster (aerobic exercise) most days of the week. Activities may include  walking, swimming, or biking.  Include exercise to strengthen your muscles (resistance exercise), such as weight lifting, as part of your weekly exercise routine. Try to do these types of exercises for 30 minutes at least 3 days a week.  Do not use any products that contain nicotine or tobacco, such as cigarettes and e-cigarettes. If you need help quitting, ask your health care provider.  Control any long-term (chronic) conditions you have, such as high cholesterol or diabetes. Monitoring  Monitor your blood pressure at home as told by your health care provider. Your personal target blood pressure may vary depending on your medical conditions, your age, and other factors.  Have your blood pressure checked regularly, as often as told by your health care provider. Working with your health care provider  Review all the medicines you take with your health care provider because there may be side effects or interactions.  Talk with your health care provider about your diet, exercise habits, and other lifestyle factors that may be contributing to hypertension.  Visit your health care provider regularly. Your health care provider can help you create and adjust your plan for managing hypertension. Will I need medicine to control my blood pressure? Your health care provider may prescribe medicine if lifestyle changes are not enough to get your blood pressure under control, and if:  Your systolic blood pressure is 130 or higher.  Your diastolic blood pressure is 80 or higher. Take medicines only as told by your health care provider. Follow the directions carefully. Blood pressure medicines must be taken as prescribed. The medicine does not work as well when you skip doses. Skipping doses also puts you at risk for problems. Contact a health care provider if:  You think you are having a reaction to medicines you have taken.  You have repeated (recurrent) headaches.  You feel dizzy.  You have  swelling in your ankles.  You have trouble with your vision. Get help right away if:  You develop a severe headache or confusion.  You have unusual weakness or numbness, or you feel faint.  You have severe pain in your chest or abdomen.  You vomit repeatedly.  You have trouble breathing. Summary  Hypertension is when the force of blood pumping through your arteries is too strong. If this condition is not controlled, it may put you at risk for serious complications.  Your personal target blood pressure may vary depending on your medical conditions, your age, and other factors. For most people, a normal blood pressure is less than 120/80.  Hypertension is managed by lifestyle changes, medicines, or both. Lifestyle changes include weight loss, eating a healthy, low-sodium diet, exercising more, and limiting alcohol. This information is not intended to replace advice given to you by your health care provider. Make sure you discuss any questions you have with your health care provider. Document Revised: 07/28/2018 Document Reviewed: 03/03/2016 Elsevier Patient Education  [employer].    If you have lab work done today you will be contacted with your lab results within the next 2 weeks.  If you have not heard from [place] then please contact us.  The fastest way to get your results is to register for My Chart.   IF you received an x-ray today, you will receive an invoice from [facility]. Please contact [facility] at [phone] with questions or concerns regarding your invoice.   IF you received labwork today, you will receive an invoice from [place]. Please contact [facility] at [phone] with questions or concerns regarding your invoice.   Our billing staff will not be able to assist you with questions regarding bills from these companies.  You will be contacted with the lab results as soon as they are available. The fastest way to get your results is to  activate your My Chart account. Instructions are located on the last page of this paperwork. If you have not heard from [place] regarding the results in 2 weeks, please contact this office.

## 2020-04-03 DIAGNOSIS — Z23 Encounter for immunization: Secondary | ICD-10-CM | POA: Diagnosis not present

## 2020-04-03 LAB — BASIC METABOLIC PANEL
BUN/Creatinine Ratio: 18 (ref 12–28)
BUN: 14 mg/dL (ref 8–27)
CO2: 27 mmol/L (ref 20–29)
Calcium: 10 mg/dL (ref 8.7–10.3)
Chloride: 95 mmol/L — ABNORMAL LOW (ref 96–106)
Creatinine, Ser: 0.76 mg/dL (ref 0.57–1.00)
GFR calc Af Amer: 96 mL/min/{1.73_m2} (ref >59)
GFR calc non Af Amer: 83 mL/min/{1.73_m2} (ref >59)
Glucose: 126 mg/dL — ABNORMAL HIGH (ref 65–99)
Potassium: 3.3 mmol/L — ABNORMAL LOW (ref 3.5–5.2)
Sodium: 136 mmol/L (ref 134–144)

## 2020-04-03 LAB — VITAMIN D 25 HYDROXY (VIT D DEFICIENCY, FRACTURES): Vit D, 25-Hydroxy: 26 ng/mL — ABNORMAL LOW (ref 30.0–100.0)

## 2020-04-11 ENCOUNTER — Other Ambulatory Visit: Payer: Self-pay | Admitting: Family Medicine

## 2020-04-11 DIAGNOSIS — R739 Hyperglycemia, unspecified: Secondary | ICD-10-CM

## 2020-04-11 DIAGNOSIS — E876 Hypokalemia: Secondary | ICD-10-CM

## 2020-04-11 NOTE — Progress Notes (Signed)
See lab notes

## 2020-04-14 ENCOUNTER — Encounter: Payer: BC Managed Care – PPO | Admitting: Family Medicine

## 2020-04-21 ENCOUNTER — Telehealth: Payer: Self-pay | Admitting: Family Medicine

## 2020-04-21 DIAGNOSIS — E876 Hypokalemia: Secondary | ICD-10-CM

## 2020-04-21 MED ORDER — POTASSIUM CHLORIDE ER 10 MEQ PO TBCR: 10.0000 meq | EXTENDED_RELEASE_TABLET | Freq: Every day | ORAL | 1 refills | Status: DC

## 2020-04-21 NOTE — Telephone Encounter (Signed)
Pt under impression she is being prescribed potassium pt lab note does read you will send in rx please advise

## 2020-04-21 NOTE — Telephone Encounter (Signed)
What is the name of the medication? Potassium supplement   Have you contacted your pharmacy to request a refill? Yes her script is not there. She is under the impression-she is being prescribed this.  Which pharmacy would you like this sent to? Pharmacy  [facility] [phone] - [place], [place] - [address] AT [facility] [address]  [address] [place] [address]  Phone:  [phone] Fax:  [phone]  DEA #:  [id number]      Patient notified that their request is being sent to the clinical staff for review and that they should receive a call once it is complete. If they do not receive a call within 72 hours they can check with their pharmacy or our office.

## 2020-05-14 ENCOUNTER — Other Ambulatory Visit: Payer: Self-pay | Admitting: Family Medicine

## 2020-05-14 DIAGNOSIS — E876 Hypokalemia: Secondary | ICD-10-CM

## 2020-05-15 ENCOUNTER — Encounter: Payer: Self-pay | Admitting: Family Medicine

## 2020-05-15 ENCOUNTER — Ambulatory Visit: Payer: BC Managed Care – PPO | Admitting: Family Medicine

## 2020-05-15 ENCOUNTER — Other Ambulatory Visit: Payer: Self-pay

## 2020-05-15 VITALS — BP 144/83 | HR 77 | Temp 98.1°F | Ht 65.0 in | Wt 190.0 lb

## 2020-05-15 DIAGNOSIS — R609 Edema, unspecified: Secondary | ICD-10-CM | POA: Diagnosis not present

## 2020-05-15 DIAGNOSIS — R7989 Other specified abnormal findings of blood chemistry: Secondary | ICD-10-CM

## 2020-05-15 DIAGNOSIS — E876 Hypokalemia: Secondary | ICD-10-CM

## 2020-05-15 DIAGNOSIS — R739 Hyperglycemia, unspecified: Secondary | ICD-10-CM | POA: Diagnosis not present

## 2020-05-15 MED ORDER — LOSARTAN POTASSIUM 50 MG PO TABS: 50.0000 mg | ORAL_TABLET | Freq: Every day | ORAL | 1 refills | Status: DC

## 2020-05-15 MED ORDER — POTASSIUM CHLORIDE ER 10 MEQ PO TBCR: 10.0000 meq | EXTENDED_RELEASE_TABLET | Freq: Every day | ORAL | 1 refills | Status: DC

## 2020-05-15 MED ORDER — CHLORTHALIDONE 25 MG PO TABS
25.0000 mg | ORAL_TABLET | Freq: Every day | ORAL | 1 refills | Status: DC
Start: 2020-05-15 — End: 2020-11-26

## 2020-05-15 MED ORDER — CITALOPRAM HYDROBROMIDE 40 MG PO TABS: 40.0000 mg | ORAL_TABLET | Freq: Every day | ORAL | 1 refills | Status: DC

## 2020-05-15 NOTE — Progress Notes (Signed)
Subjective:  Patient ID: Catherine Cole, female    DOB: 03-22-1956  Age: 65 y.o. MRN: [id number]  CC:  Chief Complaint  Patient presents with  . Transitions Of Care    Pt reports she feels well, but is having issue with the gland on the R side of her neck/jaw line. Pt states she thinks she may need to see an ENT to figure it out. Pt would like a new handy cap card.      HPI Catherine Cole presents for  Follow-up, continued transition of care.  I last saw her December 15.Doing well higher dose of buspirone at that time, stable ADD symptoms on Vyvanse, stable hypertension, lymphedema, hypokalemia.  Right neck/face issue: Swelling on side R side of face at times, past year. Saw ENT for this in past. Saw Dr. Lucia Gaskins in 11/2019. had R parotid gland enlargement. No masses or calculi noted, lobulated, but no signs of infection or tumor. Prior R cholesteatoma.  Swelling increased last weekend, with increased pain, no fever. Parotid area more swollen. No pain in mouth.no tooth pain or new sensitivity on right.  feeling better now and less swelling. Getting better.  Planning on seeing dentist, she will call for appt.  Can hear better on affected side now- doing better.  Use honey lemon lozenge.  Symptoms improved.  Vitamin D deficiency: Improved from previous readings, recommended over-the-counter 2000 units vitamin D per day. Has been taking supplement - most days, some missed doses.   Last vitamin D Lab Results  Component Value Date   VD25OH 26.0 (L) 04/02/2020   Hypokalemia Stable but still slightly low on December labs, vitamin D supplement or potassium rich foods discussed.  Plan for recheck today.  Taking potassium 89meq with dinner. Feels like upset stomach after dinner. Few nights ago more stomach upset, diarrhea one episode. Has IBS - felt like flair, ok since that time and watching diet.  Taking potassium daily except 2 missed doses. Trying to incorporate potassium  rich foods as well.  Avoiding caffeine d/t lymphedema typically, some tea at times recently.  Still some fatigue - trouble walking long distances d/t fatigue, uses placard if far parking spots. has to stop to rest. Noted since recovering from breast cancer, lymphedema. Needs new handicap placard. working in some home PT, exercises. Feels like stamina is getting better. Numbness/toe symptoms better.  Hyperglycemia with glucose 126 in December. Some sweets since holidays.    History Patient Active Problem List   Diagnosis Date Noted  . Genetic testing 08/03/2019  . Breast cancer, right ([place]) 07/25/2019  . 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 Family history of ovarian cancer   . Family history of breast cancer   . Family history of colon cancer   . Family history of kidney cancer   . Family history of melanoma   . Vitamin D deficiency 12/12/2018  . Essential hypertension, benign 10/15/2015  . Kidney stone 05/02/2015  . Palpitations 02/08/2015  . Atypical chest pain 02/08/2015  . Exertional dyspnea 02/08/2015  . Lower extremity edema 02/08/2015  . Mixed dyslipidemia 03/26/2014  . 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/03/2013  . Depression with anxiety 04/26/2012  . ADHD (attention deficit hyperactivity disorder) 04/26/2012   Past Medical History:  Diagnosis Date  . Adult ADHD   . Anemia   . Anxiety   . Cancer ([place])    Phreesia 05/13/2020  . Complication of anesthesia   . Depression   .  Depression    Phreesia 05/13/2020  . Family history of breast cancer   . Family history of colon cancer   . Family history of kidney cancer   . Family history of melanoma   . Family history of ovarian cancer   . Hypertension   . IBS (irritable bowel syndrome)   . PONV (postoperative nausea and vomiting)    Past Surgical History:  Procedure Laterality Date  . ABDOMINAL HYSTERECTOMY N/A    Phreesia 05/13/2020  . APPENDECTOMY     12 yrs  . BREAST SURGERY N/A     Phreesia 05/13/2020  . CHOLECYSTECTOMY  1995  . CHOLESTEATOMA EXCISION     13 and 22 yrs  . FRACTURE SURGERY    . [place]  . LESION DESTRUCTION N/A 12/03/2015   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;  Surgeon: [name], DO;  Location: [facility];  Service: Plastics;  Laterality: N/A;  . MASTECTOMY W/ SENTINEL NODE BIOPSY Bilateral 07/25/2019   Procedure: BILATERAL MASTECTOMY WITH RIGHT SENTINEL LYMPH NODE BIOPSY, LEFT RISK REDUCING MASTECTOMY;  Surgeon: [name], MD;  Location: [facility];  Service: General;  Laterality: Bilateral;  PEC BLOCK  . TONSILLECTOMY     15 yrs   Allergies  Allergen Reactions  . Latex Hives  . Demerol Nausea And Vomiting  . Codeine Nausea And Vomiting  . Erythromycin Itching and Rash  . Neosporin [Neomycin-Polymyxin B Gu] Itching and Rash   Prior to Admission medications   Medication Sig Start Date End Date Taking? Authorizing Provider  busPIRone (BUSPAR) 10 MG tablet Take 1 tablet (10 mg total) by mouth 2 (two) times daily as needed. 04/02/20  Yes [name], MD  chlorthalidone (HYGROTON) 25 MG tablet Take 1 tablet (25 mg total) by mouth daily. 01/11/20  Yes [name], [name], MD  citalopram (CELEXA) 40 MG tablet Take 1 tablet (40 mg total) by mouth daily. 01/11/20  Yes [name], [name], MD  Incontinence Supply Disposable (DEPEND EASY FIT UNDERGARMENTS) MISC Use daily as directed. Change several times daily as needed. 06/29/18  Yes [name], [name], MD  lisdexamfetamine (VYVANSE) 50 MG capsule Take 1 capsule (50 mg total) by mouth daily. Ok to fill in 30 days 04/02/20  Yes [name], MD  lisdexamfetamine (VYVANSE) 50 MG capsule Take 1 capsule (50 mg total) by mouth daily. Ok to fill in 60 days 04/02/20  Yes [name], MD  losartan (COZAAR) 50 MG tablet Take 1 tablet (50 mg total) by mouth daily. 01/11/20  Yes [name], [name], MD   Multiple Vitamin (MULTIVITAMIN WITH MINERALS) TABS tablet Take 1 tablet by mouth daily.   Yes [provider]  potassium chloride (KLOR-CON) 10 MEQ tablet TAKE 1 TABLET BY MOUTH EVERY DAY 05/14/20  Yes [name], MD  valACYclovir (VALTREX) 500 MG tablet Take 2 tablets by mouth at acute onset for outbreak, followed by 1 tablet once a day x 5 days 01/11/20  Yes [name], [name], MD  Vitamin D, Ergocalciferol, (DRISDOL) 1.25 MG (50000 UNIT) CAPS capsule TAKE 1 CAPSULE BY MOUTH EVERY 7 DAYS 07/04/19  Yes [name], Irma M, MD  lisdexamfetamine (VYVANSE) 50 MG capsule Take 1 capsule (50 mg total) by mouth daily before breakfast. Ok to fill 04/21/20 04/02/20 05/02/20  [name], MD   Social History   Socioeconomic History  . Marital status: Widowed  Spouse name: Not on file  . Number of children: Not on file  . Years of education: Not on file  . Highest education level: Not on file  Occupational History  . Not on file  Tobacco Use  . Smoking status: Former Smoker    Packs/day: 1.00    Years: 7.00    Pack years: 7.00  . Smokeless tobacco: Never Used  . Tobacco comment: some day smoker when she smoked  Substance and Sexual Activity  . Alcohol use: No  . Drug use: No  . Sexual activity: Never  Other Topics Concern  . Not on file  Social History Narrative  . Not on file   Social Determinants of Health   Financial Resource Strain: Not on file  Food Insecurity: Not on file  Transportation Needs: Not on file  Physical Activity: Not on file  Stress: Not on file  Social Connections: Not on file  Intimate Partner Violence: Not on file    Review of Systems Per HPI.   Objective:   Vitals:   05/15/20 1529  BP: (!) 144/83  Pulse: 77  Temp: 98.1 F (36.7 C)  TempSrc: Temporal  SpO2: 95%  Weight: 190 lb (86.2 kg)  Height: 5\' 5"  (1.651 m)     Physical Exam Vitals reviewed.  Constitutional:      Appearance: She is well-developed and  well-nourished.  HENT:     Head: Normocephalic and atraumatic.     Ears:     Comments: Slight prominence of right parotid versus left, but no significant edema.  No focal tenderness.  No focal nodularity or stone palpated.  External ear nontender, canal without erythema/edema or exudate.  TM pearly gray. No discharge noted at [facility] duct in mouth, no tooth tenderness or gum swelling. Eyes:     Extraocular Movements: EOM normal.     Conjunctiva/sclera: Conjunctivae normal.     Pupils: Pupils are equal, round, and reactive to light.  Neck:     Vascular: No carotid bruit.  Cardiovascular:     Rate and Rhythm: Normal rate and regular rhythm.     Pulses: Intact distal pulses.     Heart sounds: Normal heart sounds.  Pulmonary:     Effort: Pulmonary effort is normal.     Breath sounds: Normal breath sounds.  Abdominal:     Palpations: Abdomen is soft. There is no pulsatile mass.     Tenderness: There is no abdominal tenderness.  Lymphadenopathy:     Cervical: No cervical adenopathy.  Skin:    General: Skin is warm and dry.  Neurological:     Mental Status: She is alert and oriented to person, place, and time.  Psychiatric:        Mood and Affect: Mood and affect normal.        Behavior: Behavior normal.        Assessment & Plan:  Matisyn Cabeza is a 65 y.o. female . Hypokalemia - Plan: Basic metabolic panel, potassium chloride (KLOR-CON) 10 MEQ tablet  -Repeat labs, continue potassium supplement for now.  Low serum vitamin D  -2000 units supplement over-the-counter daily with recheck labs at next visit.  Hyperglycemia - Plan: Hemoglobin A1c  -Mild elevation previously, check A1c  Parotid swelling  -History with improvement suspicious for possible sialolithiasis with resolution.  No lymphadenopathy appreciated today and overall reassuring exam.  RTC precautions or follow-up with ENT if recurrent.   Meds ordered this encounter  Medications  . potassium chloride  (KLOR-CON)  10 MEQ tablet    Sig: Take 1 tablet (10 mEq total) by mouth daily.    Dispense:  90 tablet    Refill:  1  . chlorthalidone (HYGROTON) 25 MG tablet    Sig: Take 1 tablet (25 mg total) by mouth daily.    Dispense:  90 tablet    Refill:  1  . citalopram (CELEXA) 40 MG tablet    Sig: Take 1 tablet (40 mg total) by mouth daily.    Dispense:  90 tablet    Refill:  1  . losartan (COZAAR) 50 MG tablet    Sig: Take 1 tablet (50 mg total) by mouth daily.    Dispense:  90 tablet    Refill:  1   Patient Instructions    Continue 2000units vitamin D per day. Recheck levels next 3-6 months.   Continue potassium same dose for now, I will check potassium levels today.  If new side effects or symptoms of medications, please schedule visit and we can discuss this further.  Can also discuss timing of your medications with pharmacist.   I am glad to hear that the face swelling/parotid swelling has improved.  I suspect there could have been an obstruction in the tube that improved with the use of lozenge.  If the swelling does not continue to improve or starts to get worse I do recommend following up with ear nose and throat.  Return to the clinic or go to the nearest emergency room if any of your symptoms worsen or new symptoms occur.  I sent in some refills of meds today, let me know when next refill of Adderall needed.  Follow-up with me in June.  Thanks for coming in today.  If you have lab work done today you will be contacted with your lab results within the next 2 weeks.  If you have not heard from [place] then please contact us. The fastest way to get your results is to register for My Chart.   IF you received an x-ray today, you will receive an invoice from [facility]. Please contact [facility] at [phone] with questions or concerns regarding your invoice.   IF you received labwork today, you will receive an invoice from [place]. Please contact [facility] at  [phone] with questions or concerns regarding your invoice.   Our billing staff will not be able to assist you with questions regarding bills from these companies.  You will be contacted with the lab results as soon as they are available. The fastest way to get your results is to activate your My Chart account. Instructions are located on the last page of this paperwork. If you have not heard from [place] regarding the results in 2 weeks, please contact this office.         Signed, Merri Ray, MD [facility]

## 2020-05-15 NOTE — Patient Instructions (Addendum)
  Continue 2000units vitamin D per day. Recheck levels next 3-6 months.   Continue potassium same dose for now, I will check potassium levels today.  If new side effects or symptoms of medications, please schedule visit and we can discuss this further.  Can also discuss timing of your medications with pharmacist.   I am glad to hear that the face swelling/parotid swelling has improved.  I suspect there could have been an obstruction in the tube that improved with the use of lozenge.  If the swelling does not continue to improve or starts to get worse I do recommend following up with ear nose and throat.  Return to the clinic or go to the nearest emergency room if any of your symptoms worsen or new symptoms occur.  I sent in some refills of meds today, let me know when next refill of Adderall needed.  Follow-up with me in June.  Thanks for coming in today.  If you have lab work done today you will be contacted with your lab results within the next 2 weeks.  If you have not heard from [place] then please contact us. The fastest way to get your results is to register for My Chart.   IF you received an x-ray today, you will receive an invoice from [facility]. Please contact [facility] at [phone] with questions or concerns regarding your invoice.   IF you received labwork today, you will receive an invoice from [facility]. Please contact [facility] at [phone] with questions or concerns regarding your invoice.   Our billing staff will not be able to assist you with questions regarding bills from these companies.  You will be contacted with the lab results as soon as they are available. The fastest way to get your results is to activate your My Chart account. Instructions are located on the last page of this paperwork. If you have not heard from [place] regarding the results in 2 weeks, please contact this office.

## 2020-05-16 LAB — BASIC METABOLIC PANEL
BUN/Creatinine Ratio: 28 (ref 12–28)
BUN: 20 mg/dL (ref 8–27)
CO2: 29 mmol/L (ref 20–29)
Calcium: 9.6 mg/dL (ref 8.7–10.3)
Chloride: 97 mmol/L (ref 96–106)
Creatinine, Ser: 0.72 mg/dL (ref 0.57–1.00)
GFR calc Af Amer: 102 mL/min/{1.73_m2} (ref >59)
GFR calc non Af Amer: 89 mL/min/{1.73_m2} (ref >59)
Glucose: 88 mg/dL (ref 65–99)
Potassium: 3.8 mmol/L (ref 3.5–5.2)
Sodium: 141 mmol/L (ref 134–144)

## 2020-05-16 LAB — HEMOGLOBIN A1C
Est. average glucose Bld gHb Est-mCnc: 117 mg/dL
Hgb A1c MFr Bld: 5.7 % — ABNORMAL HIGH (ref 4.8–5.6)

## 2020-05-27 ENCOUNTER — Ambulatory Visit: Payer: BC Managed Care – PPO | Attending: Internal Medicine

## 2020-05-27 ENCOUNTER — Other Ambulatory Visit: Payer: Self-pay

## 2020-05-27 DIAGNOSIS — Z23 Encounter for immunization: Secondary | ICD-10-CM

## 2020-05-27 NOTE — Progress Notes (Signed)
   Covid-19 Vaccination Clinic  Name:  Catherine Cole    MRN: [id number] DOB: 1955-10-17  05/27/2020  [name] was observed post Covid-19 immunization for 15 minutes without incident. She was provided with Vaccine Information Sheet and instruction to access the V-Safe system.   [name] was instructed to call 911 with any severe reactions post vaccine: Marland Kitchen Difficulty breathing  . Swelling of face and throat  . A fast heartbeat  . A bad rash all over body  . Dizziness and weakness   Immunizations Administered    Name Date Dose VIS Date Route   PFIZER Comrnaty([place]) Covid-19 Vaccine 05/27/2020  1:56 PM 0.3 mL 03/27/2020 Intramuscular   Manufacturer: [place]   Lot: [id number]   NDC: [phone]

## 2020-07-08 ENCOUNTER — Telehealth: Payer: Self-pay | Admitting: Licensed Clinical Social Worker

## 2020-07-08 NOTE — Telephone Encounter (Signed)
CSW received TC from patient stating that she is interested in any support available. She is still dealing with lymphedema and adjusting to physical changes in survivorship in addition to other stressors with housing.  She is seeing a counselor which is helping.  CSW discussed breast cancer support group, [employer], and [employer] with patient. Signed up for support group and added to interest list for next [place] also e-mailed patient a copy of upcoming April support programs.   [name], LCSW

## 2020-08-07 ENCOUNTER — Other Ambulatory Visit: Payer: Self-pay | Admitting: Family Medicine

## 2020-08-07 DIAGNOSIS — F900 Attention-deficit hyperactivity disorder, predominantly inattentive type: Secondary | ICD-10-CM

## 2020-08-07 NOTE — Telephone Encounter (Signed)
Vyvanse LFD 04/02/20 #30 with no refills LOV 05/15/20 NOV 10/16/20

## 2020-08-07 NOTE — Telephone Encounter (Signed)
Pt called in asking for a refill on the Vyvanse 50MG , [facility] uses [facility]  [address]. Pt has an appt with Dr.Greene on 10/16/20. Please advise

## 2020-08-08 MED ORDER — LISDEXAMFETAMINE DIMESYLATE 50 MG PO CAPS: 50.0000 mg | ORAL_CAPSULE | Freq: Every day | ORAL | 0 refills | Status: DC

## 2020-08-08 MED ORDER — LISDEXAMFETAMINE DIMESYLATE 50 MG PO CAPS
50.0000 mg | ORAL_CAPSULE | Freq: Every day | ORAL | 0 refills | Status: DC
Start: 2020-08-08 — End: 2020-12-12

## 2020-08-08 NOTE — Telephone Encounter (Signed)
OV in December to discuss. Controlled substance database (PDMP) reviewed. No concerns appreciated.  3 months rx refilled.

## 2020-08-13 ENCOUNTER — Other Ambulatory Visit: Payer: BC Managed Care – PPO

## 2020-08-20 ENCOUNTER — Other Ambulatory Visit: Payer: Self-pay

## 2020-08-20 ENCOUNTER — Ambulatory Visit
Admission: RE | Admit: 2020-08-20 | Discharge: 2020-08-20 | Disposition: A | Discharge status: Home / Self Care | Payer: BC Managed Care – PPO | Source: Ambulatory Visit | Attending: Family Medicine | Admitting: Family Medicine

## 2020-08-20 DIAGNOSIS — M8580 Other specified disorders of bone density and structure, unspecified site: Secondary | ICD-10-CM

## 2020-08-20 DIAGNOSIS — Z8262 Family history of osteoporosis: Secondary | ICD-10-CM

## 2020-09-03 ENCOUNTER — Other Ambulatory Visit: Payer: Self-pay | Admitting: Family Medicine

## 2020-09-03 DIAGNOSIS — F418 Other specified anxiety disorders: Secondary | ICD-10-CM

## 2020-10-16 ENCOUNTER — Other Ambulatory Visit: Payer: Self-pay | Admitting: Family Medicine

## 2020-10-16 ENCOUNTER — Other Ambulatory Visit: Payer: Self-pay

## 2020-10-16 ENCOUNTER — Telehealth: Payer: Self-pay

## 2020-10-16 ENCOUNTER — Encounter: Payer: Self-pay | Admitting: Family Medicine

## 2020-10-16 ENCOUNTER — Ambulatory Visit: Payer: BC Managed Care – PPO | Admitting: Family Medicine

## 2020-10-16 VITALS — BP 140/82 | HR 96 | Temp 98.4°F | Wt 186.0 lb

## 2020-10-16 DIAGNOSIS — R7989 Other specified abnormal findings of blood chemistry: Secondary | ICD-10-CM

## 2020-10-16 DIAGNOSIS — M816 Localized osteoporosis [Lequesne]: Secondary | ICD-10-CM

## 2020-10-16 DIAGNOSIS — E876 Hypokalemia: Secondary | ICD-10-CM

## 2020-10-16 DIAGNOSIS — R739 Hyperglycemia, unspecified: Secondary | ICD-10-CM | POA: Diagnosis not present

## 2020-10-16 DIAGNOSIS — F418 Other specified anxiety disorders: Secondary | ICD-10-CM

## 2020-10-16 DIAGNOSIS — F900 Attention-deficit hyperactivity disorder, predominantly inattentive type: Secondary | ICD-10-CM

## 2020-10-16 LAB — BASIC METABOLIC PANEL
BUN: 19 mg/dL (ref 6–23)
CO2: 28 mEq/L (ref 19–32)
Calcium: 10.2 mg/dL (ref 8.4–10.5)
Chloride: 96 mEq/L (ref 96–112)
Creatinine, Ser: 0.81 mg/dL (ref 0.40–1.20)
GFR: 76.61 mL/min (ref >60.00)
Glucose, Bld: 89 mg/dL (ref 70–99)
Potassium: 3.2 mEq/L — ABNORMAL LOW (ref 3.5–5.1)
Sodium: 137 mEq/L (ref 135–145)

## 2020-10-16 LAB — VITAMIN D 25 HYDROXY (VIT D DEFICIENCY, FRACTURES): VITD: 23.34 ng/mL — ABNORMAL LOW (ref 30.00–100.00)

## 2020-10-16 LAB — HEMOGLOBIN A1C: Hgb A1c MFr Bld: 5.8 % (ref 4.6–6.5)

## 2020-10-16 MED ORDER — VITAMIN D (ERGOCALCIFEROL) 1.25 MG (50000 UNIT) PO CAPS: 50000.0000 [IU] | ORAL_CAPSULE | ORAL | 0 refills | Status: DC

## 2020-10-16 NOTE — Progress Notes (Signed)
Subjective:  Patient ID: Catherine Cole, female    DOB: [birth year]  Age: 65 y.o. MRN: [id number]  CC:  Chief Complaint  Patient presents with   Follow-up   DEXA    T-score of -2.7   Vitamin D deficiency     Taking 2000 units dauly, recheck levels today     HPI [name] presents for  Osteoporosis Most recent bone density test on Aug 20, 2020 with T score of -2.7 after the femoral neck on the left.  -1.6 on dual femur, and AP spine.  No current or previous treatments for osteoporosis. Mother with osteoporosis. History of breast cancer.   Vitamin D deficiency Levels of 21 in June 2021, 26 in December 2021.  Currently taking 2000 u few days pre week - has to take with certain foods.     Depression with anxiety, ADHD. Treated with Vyvanse 50 mg daily.  Stable symptoms when discussed in December.  Also takes Celexa 40 mg daily, BuSpar 10 mg daily.  Higher dosing caused sluggish feeling.  Additional dose buspar occasionally during the day if more anxiety.  Few times per month when discussed in December. Some increased stress recently. Has had to use 2 buspar some with recent stress but coping techniques working ok.  Denies SI/[place] Controlled substance database (PDMP) reviewed. No concerns appreciated.  Vyvanse last filled 10/13/2020. UDS 01/11/2020.   Depression screen [facility] 2/9 10/16/2020 04/02/2020 01/11/2020 10/04/2019 09/14/2019  Decreased Interest 2 0 0 0 0  Down, Depressed, Hopeless 3 0 0 0 0  PHQ - 2 Score 5 0 0 0 0  Altered sleeping 0 1 - - -  Tired, decreased energy 0 2 - - -  Change in appetite 0 0 - - -  Feeling bad or failure about yourself  0 0 - - -  Trouble concentrating 0 0 - - -  Moving slowly or fidgety/restless 0 0 - - -  Suicidal thoughts 0 0 - - -  PHQ-9 Score 5 3 - - -  Difficult doing work/chores Somewhat difficult - - - -  Some recent data might be hidden      Prediabetes: Elevated A1c in January, barely at prediabetes level.  Diet/activity  approach discussed.  Had been eating more sweets during the holidays. Lab Results  Component Value Date   HGBA1C 5.8 10/16/2020   Wt Readings from Last 3 Encounters:  10/16/20 186 lb (84.4 kg)  05/15/20 190 lb (86.2 kg)  04/02/20 188 lb (85.3 kg)   Hypokalemia Normal in January.  Potassium 10 mg daily at that time as well as potassium rich foods. Off supplement past month - eating potassium rich foods.  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   Family history of ovarian cancer    Family history of breast cancer    Family history of colon cancer    Family history of kidney cancer    Family history of melanoma    Vitamin D deficiency 12/12/2018   Essential hypertension, benign 10/15/2015   Kidney stone 05/02/2015   Palpitations 02/08/2015   Atypical chest pain 02/08/2015   Exertional dyspnea 02/08/2015   Lower extremity edema 02/08/2015   Mixed dyslipidemia 03/26/2014   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/03/2013  Depression with anxiety 04/26/2012   ADHD (attention deficit hyperactivity disorder) 04/26/2012   Past Medical History:  Diagnosis Date   Adult ADHD    Anemia    Anxiety    Cancer ([place])    Phreesia 16/01/9603   Complication of anesthesia    Depression    Depression    Phreesia 05/13/2020   Family history of breast cancer    Family history of colon cancer    Family history of kidney cancer    Family history of melanoma    Family history of ovarian cancer    Hypertension    IBS (irritable bowel syndrome)    PONV (postoperative nausea and vomiting)    Past Surgical History:  Procedure Laterality Date   ABDOMINAL HYSTERECTOMY N/A    Phreesia 05/13/2020   APPENDECTOMY     12 yrs   BREAST SURGERY N/A    Phreesia 05/13/2020    CHOLECYSTECTOMY  1995   CHOLESTEATOMA EXCISION     13 and 22 yrs   FRACTURE SURGERY     KIDNEY STONE SURGERY  1995   LESION DESTRUCTION N/A 12/03/2015   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;  Surgeon: [name], DO;  Location: [facility];  Service: Plastics;  Laterality: N/A;   MASTECTOMY W/ SENTINEL NODE BIOPSY Bilateral 07/25/2019   Procedure: BILATERAL MASTECTOMY WITH RIGHT SENTINEL LYMPH NODE BIOPSY, LEFT RISK REDUCING MASTECTOMY;  Surgeon: [name], MD;  Location: [facility];  Service: General;  Laterality: Bilateral;  PEC BLOCK   TONSILLECTOMY     15 yrs   Allergies  Allergen Reactions   Latex Hives   Demerol Nausea And Vomiting   Codeine Nausea And Vomiting   Erythromycin Itching and Rash   Neosporin [Neomycin-Polymyxin B Gu] Itching and Rash   Prior to Admission medications   Medication Sig Start Date End Date Taking? Authorizing Provider  busPIRone (BUSPAR) 10 MG tablet TAKE 1 TABLET BY MOUTH 2 TIMES DAILY AS NEEDED. 09/03/20  Yes [name], MD  chlorthalidone (HYGROTON) 25 MG tablet Take 1 tablet (25 mg total) by mouth daily. 05/15/20  Yes [name], MD  citalopram (CELEXA) 40 MG tablet Take 1 tablet (40 mg total) by mouth daily. 05/15/20  Yes [name], MD  Incontinence Supply Disposable (DEPEND EASY FIT UNDERGARMENTS) MISC Use daily as directed. Change several times daily as needed. 06/29/18  Yes [name], [name], MD  lisdexamfetamine (VYVANSE) 50 MG capsule Take 1 capsule (50 mg total) by mouth daily. Ok to fill in 30 days 08/08/20  Yes [name], MD  lisdexamfetamine (VYVANSE) 50 MG capsule Take 1 capsule (50 mg total) by mouth daily. Ok to fill in 60 days 08/08/20  Yes [name], MD  losartan (COZAAR) 50 MG tablet Take 1 tablet (50 mg total) by mouth daily. 05/15/20  Yes [name], MD  Multiple Vitamin (MULTIVITAMIN WITH MINERALS) TABS tablet Take 1 tablet  by mouth daily.   Yes [provider]  valACYclovir (VALTREX) 500 MG tablet Take 2 tablets by mouth at acute onset for outbreak, followed by 1 tablet once a day x 5 days 01/11/20  Yes [name], [name], MD  lisdexamfetamine (VYVANSE) 50 MG capsule Take 1 capsule (50 mg total) by mouth daily before breakfast. Ok to fill 04/21/20 08/08/20 09/07/20  [name], MD  potassium chloride (KLOR-CON) 10 MEQ tablet Take 1 tablet (10 mEq total) by mouth daily. Patient  not taking: Reported on 10/16/2020 05/15/20   [name], MD  Vitamin D, Ergocalciferol, (DRISDOL) 1.25 MG (50000 UNIT) CAPS capsule TAKE 1 CAPSULE BY MOUTH EVERY 7 DAYS Patient not taking: Reported on 10/16/2020 07/04/19   [name], [name], MD   Social History   Socioeconomic History   Marital status: Widowed    Spouse name: Not on file   Number of children: Not on file   Years of education: Not on file   Highest education level: Not on file  Occupational History   Not on file  Tobacco Use   Smoking status: Former    Packs/day: 1.00    Years: 7.00    Pack years: 7.00    Types: Cigarettes   Smokeless tobacco: Never   Tobacco comments:    some day smoker when she smoked  Substance and Sexual Activity   Alcohol use: No   Drug use: No   Sexual activity: Never  Other Topics Concern   Not on file  Social History Narrative   Not on file   Social Determinants of Health   Financial Resource Strain: Not on file  Food Insecurity: Not on file  Transportation Needs: Not on file  Physical Activity: Not on file  Stress: Not on file  Social Connections: Not on file  Intimate Partner Violence: Not on file    Review of Systems Per HPI.   Objective:   Vitals:   10/16/20 1327  BP: 140/82  Pulse: 96  Temp: 98.4 F (36.9 C)  TempSrc: Temporal  SpO2: 96%  Weight: 186 lb (84.4 kg)     Physical Exam Vitals reviewed.  Constitutional:      Appearance: Normal appearance. She is well-developed.  HENT:      Head: Normocephalic and atraumatic.  Eyes:     Conjunctiva/sclera: Conjunctivae normal.     Pupils: Pupils are equal, round, and reactive to light.  Neck:     Vascular: No carotid bruit.  Cardiovascular:     Rate and Rhythm: Normal rate and regular rhythm.     Heart sounds: Normal heart sounds.  Pulmonary:     Effort: Pulmonary effort is normal.     Breath sounds: Normal breath sounds.  Abdominal:     Palpations: Abdomen is soft. There is no pulsatile mass.     Tenderness: There is no abdominal tenderness.  Musculoskeletal:     Right lower leg: No edema.     Left lower leg: No edema.  Skin:    General: Skin is warm and dry.  Neurological:     Mental Status: She is alert and oriented to person, place, and time.  Psychiatric:        Mood and Affect: Mood normal.        Behavior: Behavior normal.        Thought Content: Thought content normal.       Assessment & Plan:  Haille Pardi is a 65 y.o. female . Localized osteoporosis without current pathological fracture - Plan: Ambulatory referral to Endocrinology  -Refer to endocrinology to decide on osteoporosis treatment options.  Low serum vitamin D - Plan: Vitamin D (25 hydroxy)  -Repeat labs, consider 50,000 unit dosing if still low  Hypokalemia - Plan: Basic Metabolic Panel (BMET)  -Repeat labs.  Potassium rich diet.  Hyperglycemia - Plan: Hemoglobin A1c  -Check A1c, anticipate improvement  Attention deficit hyperactivity disorder (ADHD), predominantly inattentive type  -Stable ADD symptoms, no new side effects of meds, okay  to refill when due.  Depression with anxiety  -Overall stable with current med regimen.  Some recent increased stressors but reports coping techniques in place.  Handout given on stress management with RTC precautions if persistent depressive symptoms.  No orders of the defined types were placed in this encounter.  Patient Instructions  I will refer you to endocrinology for  osteoporosis.  I will recheck potassium and vitamin D, as well as blood sugar - I expect these to be ok.  No change in meds today.  Sorry to hear about the recent stressors.  No change in medications today, but see information below on managing stress.  If you do notice increasing or persistent depression symptoms please follow-up and we can discuss other treatments.  Let us know if we can help.  Follow-up in 3 months but happy to see you sooner if needed.  Managing Stress, Adult Feeling a certain amount of stress is normal. Stress helps our body and mind get ready to deal with the demands of life. Stress hormones can motivate you to do well at work and meet your responsibilities. However severe or long-lasting (chronic) stress can affect your mental and physical health. Chronic stress puts you at higher risk for anxiety, depression, and other health problems like digestiveproblems, muscle aches, heart disease, high blood pressure, and stroke. What are the causes? Common causes of stress include: Demands from work, such as deadlines, feeling overworked, or having long hours. Pressures at home, such as money issues, disagreements with a spouse, or parenting issues. Pressures from major life changes, such as divorce, moving, loss of a loved one, or chronic illness. You may be at higher risk for stress-related problems if you do not get enough sleep, are in poor health, do not have emotional support, or have a mentalhealth disorder like anxiety or depression. How to recognize stress Stress can make you: Have trouble sleeping. Feel sad, anxious, irritable, or overwhelmed. Lose your appetite. Overeat or want to eat unhealthy foods. Want to use drugs or alcohol. Stress can also cause physical symptoms, such as: Sore, tense muscles, especially in the shoulders and neck. Headaches. Trouble breathing. A faster heart rate. Stomach pain, nausea, or vomiting. Diarrhea or constipation. Trouble  concentrating. Follow these instructions at home: Lifestyle Identify the source of your stress and your reaction to it. See a therapist who can help you change your reactions. When there are stressful events: Talk about it with family, friends, or co-workers. Try to think realistically about stressful events and not ignore them or overreact. Try to find the positives in a stressful situation and not focus on the negatives. Cut back on responsibilities at work and home, if possible. Ask for help from friends or family members if you need it. Find ways to cope with stress, such as: Meditation. Deep breathing. Yoga or tai chi. Progressive muscle relaxation. Doing art, playing music, or reading. Making time for fun activities. Spending time with family and friends. Get support from family, friends, or spiritual resources. Eating and drinking Eat a healthy diet. This includes: Eating foods that are high in fiber, such as beans, whole grains, and fresh fruits and vegetables. Limiting foods that are high in fat and processed sugars, such as fried and sweet foods. Do not skip meals or overeat. Drink enough fluid to keep your urine pale yellow. Alcohol use Do not drink alcohol if: Your health care provider tells you not to drink. You are pregnant, may be pregnant, or are planning to become  pregnant. Drinking alcohol is a way some people try to ease their stress. This can be dangerous, so if you drink alcohol: Limit how much you use to: 0-1 drink a day for women. 0-2 drinks a day for men. Be aware of how much alcohol is in your drink. In the [place] one drink equals one 12 oz bottle of beer (355 mL), one 5 oz glass of wine (148 mL), or one 1 oz glass of hard liquor (44 mL). Activity  Include 30 minutes of exercise in your daily schedule. Exercise is a good stress reducer. Include time in your day for an activity that you find relaxing. Try taking a walk, going on a bike ride, reading a book,  or listening to music. Schedule your time in a way that lowers stress, and keep a consistent schedule. Prioritize what is most important to get done.  General instructions Get enough sleep. Try to go to sleep and get up at about the same time every day. Take over-the-counter and prescription medicines only as told by your health care provider. Do not use any products that contain nicotine or tobacco, such as cigarettes, e-cigarettes, and chewing tobacco. If you need help quitting, ask your health care provider. Do not use drugs or smoke to cope with stress. Keep all follow-up visits as told by your health care provider. This is important. Where to find support Talk with your health care provider about stress management or finding a support group. Find a therapist to work with you on your stress management techniques. Contact a health care provider if: Your stress symptoms get worse. You are unable to manage your stress at home. You are struggling to stop using drugs or alcohol. Get help right away if: You may be a danger to yourself or others. You have any thoughts of death or suicide. If you ever feel like you may hurt yourself or others, or have thoughts about taking your own life, get help right away. You can go to your nearest emergency department or call: Your local emergency services (911 in the [place] A suicide crisis helpline, such as the [place] at 9192647146. This is open 24 hours a day. Summary Feeling a certain amount of stress is normal, but severe or long-lasting (chronic) stress can affect your mental and physical health. Chronic stress can put you at higher risk for anxiety, depression, and other health problems like digestive problems, muscle aches, heart disease, high blood pressure, and stroke. You may be at higher risk for stress-related problems if you do not get enough sleep, are in poor health, lack emotional support, or have a mental  health disorder like anxiety or depression. Identify the source of your stress and your reaction to it. Try talking about stressful events with family, friends, or co-workers, finding a coping method, or getting support from spiritual resources. If you need more help, talk with your health care provider about finding a support group or a mental health therapist. This information is not intended to replace advice given to you by your health care provider. Make sure you discuss any questions you have with your healthcare provider. Document Revised: 11/01/2018 Document Reviewed: 11/01/2018 Elsevier Patient Education  2022 [place],   Merri Ray, MD [facility] 10/16/20 9:52 PM

## 2020-10-16 NOTE — Telephone Encounter (Signed)
Patient dropped off a bill she received from [place] 05/15/20 in the amount of $15 at the [facility] office.  Since [facility] is in a different service area I can not pull the transaction.  Patient states she did pay a copay of $10.00.    In pulling patients current insurance card on file, patient has a different provider listed within the [facility] as her PCP.   On insurance card it states that if current PCP is not listed the patients copay will be $25.00.  I would advise for patient to reach out to the billing department at [phone] to confirm copay and then to reach back out to her insurance company to change the charge.    I have advised patient in regard.

## 2020-10-16 NOTE — Patient Instructions (Addendum)
I will refer you to endocrinology for osteoporosis.  I will recheck potassium and vitamin D, as well as blood sugar - I expect these to be ok.  No change in meds today.  Sorry to hear about the recent stressors.  No change in medications today, but see information below on managing stress.  If you do notice increasing or persistent depression symptoms please follow-up and we can discuss other treatments.  Let us know if we can help.  Follow-up in 3 months but happy to see you sooner if needed.  Managing Stress, Adult Feeling a certain amount of stress is normal. Stress helps our body and mind get ready to deal with the demands of life. Stress hormones can motivate you to do well at work and meet your responsibilities. However severe or long-lasting (chronic) stress can affect your mental and physical health. Chronic stress puts you at higher risk for anxiety, depression, and other health problems like digestiveproblems, muscle aches, heart disease, high blood pressure, and stroke. What are the causes? Common causes of stress include: Demands from work, such as deadlines, feeling overworked, or having long hours. Pressures at home, such as money issues, disagreements with a spouse, or parenting issues. Pressures from major life changes, such as divorce, moving, loss of a loved one, or chronic illness. You may be at higher risk for stress-related problems if you do not get enough sleep, are in poor health, do not have emotional support, or have a mentalhealth disorder like anxiety or depression. How to recognize stress Stress can make you: Have trouble sleeping. Feel sad, anxious, irritable, or overwhelmed. Lose your appetite. Overeat or want to eat unhealthy foods. Want to use drugs or alcohol. Stress can also cause physical symptoms, such as: Sore, tense muscles, especially in the shoulders and neck. Headaches. Trouble breathing. A faster heart rate. Stomach pain, nausea, or  vomiting. Diarrhea or constipation. Trouble concentrating. Follow these instructions at home: Lifestyle Identify the source of your stress and your reaction to it. See a therapist who can help you change your reactions. When there are stressful events: Talk about it with family, friends, or co-workers. Try to think realistically about stressful events and not ignore them or overreact. Try to find the positives in a stressful situation and not focus on the negatives. Cut back on responsibilities at work and home, if possible. Ask for help from friends or family members if you need it. Find ways to cope with stress, such as: Meditation. Deep breathing. Yoga or tai chi. Progressive muscle relaxation. Doing art, playing music, or reading. Making time for fun activities. Spending time with family and friends. Get support from family, friends, or spiritual resources. Eating and drinking Eat a healthy diet. This includes: Eating foods that are high in fiber, such as beans, whole grains, and fresh fruits and vegetables. Limiting foods that are high in fat and processed sugars, such as fried and sweet foods. Do not skip meals or overeat. Drink enough fluid to keep your urine pale yellow. Alcohol use Do not drink alcohol if: Your health care provider tells you not to drink. You are pregnant, may be pregnant, or are planning to become pregnant. Drinking alcohol is a way some people try to ease their stress. This can be dangerous, so if you drink alcohol: Limit how much you use to: 0-1 drink a day for women. 0-2 drinks a day for men. Be aware of how much alcohol is in your drink. In the [place] one drink equals  one 12 oz bottle of beer (355 mL), one 5 oz glass of wine (148 mL), or one 1 oz glass of hard liquor (44 mL). Activity  Include 30 minutes of exercise in your daily schedule. Exercise is a good stress reducer. Include time in your day for an activity that you find relaxing. Try taking  a walk, going on a bike ride, reading a book, or listening to music. Schedule your time in a way that lowers stress, and keep a consistent schedule. Prioritize what is most important to get done.  General instructions Get enough sleep. Try to go to sleep and get up at about the same time every day. Take over-the-counter and prescription medicines only as told by your health care provider. Do not use any products that contain nicotine or tobacco, such as cigarettes, e-cigarettes, and chewing tobacco. If you need help quitting, ask your health care provider. Do not use drugs or smoke to cope with stress. Keep all follow-up visits as told by your health care provider. This is important. Where to find support Talk with your health care provider about stress management or finding a support group. Find a therapist to work with you on your stress management techniques. Contact a health care provider if: Your stress symptoms get worse. You are unable to manage your stress at home. You are struggling to stop using drugs or alcohol. Get help right away if: You may be a danger to yourself or others. You have any thoughts of death or suicide. If you ever feel like you may hurt yourself or others, or have thoughts about taking your own life, get help right away. You can go to your nearest emergency department or call: Your local emergency services (911 in the [place] A suicide crisis helpline, such as the [facility] at 910-711-5521. This is open 24 hours a day. Summary Feeling a certain amount of stress is normal, but severe or long-lasting (chronic) stress can affect your mental and physical health. Chronic stress can put you at higher risk for anxiety, depression, and other health problems like digestive problems, muscle aches, heart disease, high blood pressure, and stroke. You may be at higher risk for stress-related problems if you do not get enough sleep, are in poor  health, lack emotional support, or have a mental health disorder like anxiety or depression. Identify the source of your stress and your reaction to it. Try talking about stressful events with family, friends, or co-workers, finding a coping method, or getting support from spiritual resources. If you need more help, talk with your health care provider about finding a support group or a mental health therapist. This information is not intended to replace advice given to you by your health care provider. Make sure you discuss any questions you have with your healthcare provider. Document Revised: 11/01/2018 Document Reviewed: 11/01/2018 Elsevier Patient Education  [place].

## 2020-11-04 ENCOUNTER — Other Ambulatory Visit: Payer: BC Managed Care – PPO

## 2020-11-04 ENCOUNTER — Other Ambulatory Visit (INDEPENDENT_AMBULATORY_CARE_PROVIDER_SITE_OTHER): Payer: BC Managed Care – PPO

## 2020-11-04 ENCOUNTER — Other Ambulatory Visit: Payer: Self-pay

## 2020-11-04 DIAGNOSIS — E876 Hypokalemia: Secondary | ICD-10-CM | POA: Diagnosis not present

## 2020-11-05 LAB — BASIC METABOLIC PANEL
BUN: 12 mg/dL (ref 6–23)
CO2: 30 mEq/L (ref 19–32)
Calcium: 10.1 mg/dL (ref 8.4–10.5)
Chloride: 97 mEq/L (ref 96–112)
Creatinine, Ser: 0.87 mg/dL (ref 0.40–1.20)
GFR: 70.29 mL/min (ref >60.00)
Glucose, Bld: 157 mg/dL — ABNORMAL HIGH (ref 70–99)
Potassium: 3.2 mEq/L — ABNORMAL LOW (ref 3.5–5.1)
Sodium: 138 mEq/L (ref 135–145)

## 2020-11-07 ENCOUNTER — Encounter: Payer: Self-pay | Admitting: Family Medicine

## 2020-11-07 DIAGNOSIS — E876 Hypokalemia: Secondary | ICD-10-CM

## 2020-11-07 DIAGNOSIS — R7989 Other specified abnormal findings of blood chemistry: Secondary | ICD-10-CM

## 2020-11-25 NOTE — Addendum Note (Signed)
Addended by: Merri Ray R on: 11/25/2020 12:36 PM   Modules accepted: Orders

## 2020-11-25 NOTE — Telephone Encounter (Signed)
Orders placed for lab visit

## 2020-11-26 ENCOUNTER — Other Ambulatory Visit: Payer: BC Managed Care – PPO

## 2020-11-26 ENCOUNTER — Other Ambulatory Visit: Payer: Self-pay | Admitting: Family Medicine

## 2020-11-28 ENCOUNTER — Other Ambulatory Visit (INDEPENDENT_AMBULATORY_CARE_PROVIDER_SITE_OTHER): Payer: BC Managed Care – PPO

## 2020-11-28 ENCOUNTER — Other Ambulatory Visit: Payer: Self-pay

## 2020-11-28 DIAGNOSIS — R7989 Other specified abnormal findings of blood chemistry: Secondary | ICD-10-CM | POA: Diagnosis not present

## 2020-11-28 DIAGNOSIS — E876 Hypokalemia: Secondary | ICD-10-CM | POA: Diagnosis not present

## 2020-11-28 LAB — BASIC METABOLIC PANEL
BUN: 19 mg/dL (ref 6–23)
CO2: 27 mEq/L (ref 19–32)
Calcium: 9.7 mg/dL (ref 8.4–10.5)
Chloride: 97 mEq/L (ref 96–112)
Creatinine, Ser: 0.78 mg/dL (ref 0.40–1.20)
GFR: 80.1 mL/min (ref >60.00)
Glucose, Bld: 91 mg/dL (ref 70–99)
Potassium: 3.7 mEq/L (ref 3.5–5.1)
Sodium: 136 mEq/L (ref 135–145)

## 2020-11-28 LAB — VITAMIN D 25 HYDROXY (VIT D DEFICIENCY, FRACTURES): VITD: 40.02 ng/mL (ref 30.00–100.00)

## 2020-12-10 ENCOUNTER — Other Ambulatory Visit: Payer: Self-pay | Admitting: Family Medicine

## 2020-12-10 ENCOUNTER — Encounter: Payer: Self-pay | Admitting: Family Medicine

## 2020-12-10 DIAGNOSIS — F900 Attention-deficit hyperactivity disorder, predominantly inattentive type: Secondary | ICD-10-CM

## 2020-12-11 NOTE — Telephone Encounter (Signed)
LFD 08/08/20 #30 with no refills, prescription states ok to fill in 60 days LOV 10/16/20 NOV 01/16/21

## 2020-12-12 MED ORDER — LISDEXAMFETAMINE DIMESYLATE 50 MG PO CAPS: 50.0000 mg | ORAL_CAPSULE | Freq: Every day | ORAL | 0 refills | Status: DC

## 2020-12-12 NOTE — Telephone Encounter (Signed)
Last filled 11/12/2020.Controlled substance database (PDMP) reviewed. No concerns appreciated. Medications discussed in June.  30-monthrefills ordered.

## 2020-12-13 ENCOUNTER — Encounter: Payer: Self-pay | Admitting: Family Medicine

## 2020-12-13 DIAGNOSIS — E876 Hypokalemia: Secondary | ICD-10-CM

## 2020-12-13 DIAGNOSIS — R7989 Other specified abnormal findings of blood chemistry: Secondary | ICD-10-CM

## 2020-12-15 NOTE — Telephone Encounter (Signed)
5 year form completed. Lymphedema with fatigue, difficulty walking long distances per OV in January of this year. Ready for pickup.

## 2020-12-16 ENCOUNTER — Telehealth: Payer: Self-pay

## 2020-12-16 MED ORDER — VITAMIN D (ERGOCALCIFEROL) 1.25 MG (50000 UNIT) PO CAPS: 50000.0000 [IU] | ORAL_CAPSULE | ORAL | 1 refills | Status: DC

## 2020-12-16 MED ORDER — POTASSIUM CHLORIDE ER 10 MEQ PO TBCR: 20.0000 meq | EXTENDED_RELEASE_TABLET | Freq: Every day | ORAL | 1 refills | Status: DC

## 2020-12-16 NOTE — Telephone Encounter (Signed)
RN returned call.  Voicemail left for call back.  

## 2020-12-20 ENCOUNTER — Other Ambulatory Visit: Payer: Self-pay | Admitting: Family Medicine

## 2020-12-30 ENCOUNTER — Other Ambulatory Visit: Payer: Self-pay | Admitting: Family Medicine

## 2020-12-31 ENCOUNTER — Ambulatory Visit: Payer: BC Managed Care – PPO

## 2021-01-14 ENCOUNTER — Ambulatory Visit: Payer: BC Managed Care – PPO | Admitting: Rehabilitation

## 2021-01-16 ENCOUNTER — Ambulatory Visit: Payer: BC Managed Care – PPO | Admitting: Internal Medicine

## 2021-01-16 ENCOUNTER — Encounter: Payer: Self-pay | Admitting: Family Medicine

## 2021-01-16 ENCOUNTER — Other Ambulatory Visit: Payer: Self-pay

## 2021-01-16 ENCOUNTER — Telehealth (INDEPENDENT_AMBULATORY_CARE_PROVIDER_SITE_OTHER): Payer: BC Managed Care – PPO | Admitting: Family Medicine

## 2021-01-16 VITALS — Ht 66.0 in | Wt 178.0 lb

## 2021-01-16 DIAGNOSIS — R7989 Other specified abnormal findings of blood chemistry: Secondary | ICD-10-CM | POA: Diagnosis not present

## 2021-01-16 DIAGNOSIS — E876 Hypokalemia: Secondary | ICD-10-CM

## 2021-01-16 DIAGNOSIS — F900 Attention-deficit hyperactivity disorder, predominantly inattentive type: Secondary | ICD-10-CM | POA: Diagnosis not present

## 2021-01-16 DIAGNOSIS — F418 Other specified anxiety disorders: Secondary | ICD-10-CM | POA: Diagnosis not present

## 2021-01-16 DIAGNOSIS — I10 Essential (primary) hypertension: Secondary | ICD-10-CM

## 2021-01-16 DIAGNOSIS — M816 Localized osteoporosis [Lequesne]: Secondary | ICD-10-CM

## 2021-01-16 DIAGNOSIS — R059 Cough, unspecified: Secondary | ICD-10-CM

## 2021-01-16 MED ORDER — VITAMIN D (ERGOCALCIFEROL) 1.25 MG (50000 UNIT) PO CAPS: 50000.0000 [IU] | ORAL_CAPSULE | ORAL | 1 refills | Status: DC

## 2021-01-16 NOTE — Progress Notes (Signed)
Virtual Visit via Video Note  I connected with Catherine Cole on 01/16/21 at 11:57 AM by a video enabled telemedicine application and verified that I am speaking with the correct person using two identifiers.  Patient location: home  My location: office - [place]    I discussed the limitations, risks, security and privacy concerns of performing an evaluation and management service by telephone and the availability of in person appointments. I also discussed with the patient that there may be a patient responsible charge related to this service. The patient expressed understanding and agreed to proceed, consent obtained  Chief complaint:  Chief Complaint  Patient presents with   Follow-up    Follow up for medication check--vitamin D and calcium.   History of Present Illness: Catherine Cole is a 65 y.o. female  Osteoporosis with vitamin D deficiency Bone density test 08/20/2020 T score -2.7 left femoral neck.  No current, previous treatments for osteoporosis.  Referred to endocrinology.  Most recent vitamin D stable.  50,000 units/week dosing past few months.  Appt in October or November with endocrinology.  Last vitamin D Lab Results  Component Value Date   VD25OH 40.02 11/28/2020    Hypokalemia noted on previous labs.  Treated with potassium supplement 20 mEq daily.  Stable on August 12 labs on this dose.  Lab Results  Component Value Date   K 3.7 11/28/2020   Hypertension: Losartan 50 mg daily, chlorthalidone 25 mg daily Home readings:none recently, no new med side effects.  BP Readings from Last 3 Encounters:  10/16/20 140/82  05/15/20 (!) 144/83  04/02/20 132/80   Lab Results  Component Value Date   CREATININE 0.78 11/28/2020   ADHD Treated with Vyvanse 50 mg daily New heart palpitations, new insomnia, difficulty with appetite.  Symptoms stable. Sometimes scattered but wants to remain on same dose for now.  Controlled substance database (PDMP)  reviewed. No concerns appreciated.  Last med fill January 12, 2021.  Cough: Initially noted few months ago. Notices at bedtime. Tried a cough drop - quick onset, then coughing fit. Feeling like need to belch - water brash at times. Rarely. Watching food choices. Notes if getting too hot at times - choking feeling.  Tried prilosec for a few days - felt better. No symptoms at that time. No fever, dyspnea, or productive cough.   Depression with anxiety Treated with Celexa, BuSpar.  Some situational stressors last visit, but still working well. Occasional anxiety, but controlled with second dose of buspar on those days.    Depression screen [facility] 2/9 01/16/2021 10/16/2020 04/02/2020 01/11/2020 10/04/2019  Decreased Interest 0 2 0 0 0  Down, Depressed, Hopeless 0 3 0 0 0  PHQ - 2 Score 0 5 0 0 0  Altered sleeping - 0 1 - -  Tired, decreased energy - 0 2 - -  Change in appetite - 0 0 - -  Feeling bad or failure about yourself  - 0 0 - -  Trouble concentrating - 0 0 - -  Moving slowly or fidgety/restless - 0 0 - -  Suicidal thoughts - 0 0 - -  PHQ-9 Score - 5 3 - -  Difficult doing work/chores - Somewhat difficult - - -  Some recent data might be hidden     Patient Active Problem List   Diagnosis Date Noted   Genetic testing 08/03/2019   Breast cancer, right ([place]) 07/25/2019   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  Family history of ovarian cancer    Family history of breast cancer    Family history of colon cancer    Family history of kidney cancer    Family history of melanoma    Vitamin D deficiency 12/12/2018   Essential hypertension, benign 10/15/2015   Kidney stone 05/02/2015   Palpitations 02/08/2015   Atypical chest pain 02/08/2015   Exertional dyspnea 02/08/2015   Lower extremity edema 02/08/2015   Mixed dyslipidemia 03/26/2014   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/03/2013   Depression with anxiety 04/26/2012   ADHD (attention deficit  hyperactivity disorder) 04/26/2012   Past Medical History:  Diagnosis Date   Adult ADHD    Anemia    Anxiety    Cancer ([place])    Phreesia 22/05/5425   Complication of anesthesia    Depression    Depression    Phreesia 05/13/2020   Family history of breast cancer    Family history of colon cancer    Family history of kidney cancer    Family history of melanoma    Family history of ovarian cancer    Hypertension    IBS (irritable bowel syndrome)    PONV (postoperative nausea and vomiting)    Past Surgical History:  Procedure Laterality Date   ABDOMINAL HYSTERECTOMY N/A    Phreesia 05/13/2020   APPENDECTOMY     12 yrs   BREAST SURGERY N/A    Phreesia 05/13/2020   CHOLECYSTECTOMY  1995   CHOLESTEATOMA EXCISION     13 and 22 yrs   [facility]   LESION DESTRUCTION N/A 12/03/2015   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;  Surgeon: [name], DO;  Location: [facility];  Service: Plastics;  Laterality: N/A;   MASTECTOMY W/ SENTINEL NODE BIOPSY Bilateral 07/25/2019   Procedure: BILATERAL MASTECTOMY WITH RIGHT SENTINEL LYMPH NODE BIOPSY, LEFT RISK REDUCING MASTECTOMY;  Surgeon: [name], MD;  Location: [facility];  Service: General;  Laterality: Bilateral;  PEC BLOCK   TONSILLECTOMY     15 yrs   Allergies  Allergen Reactions   Latex Hives   Demerol Nausea And Vomiting   Codeine Nausea And Vomiting   Erythromycin Itching and Rash   Neosporin [Neomycin-Polymyxin B Gu] Itching and Rash   Prior to Admission medications   Medication Sig Start Date End Date Taking? Authorizing Provider  busPIRone (BUSPAR) 10 MG tablet TAKE 1 TABLET BY MOUTH 2 TIMES DAILY AS NEEDED. 09/03/20  Yes [name], MD  chlorthalidone (HYGROTON) 25 MG tablet TAKE 1 TABLET (25 MG TOTAL) BY MOUTH DAILY. 12/30/20  Yes [name], MD  citalopram (CELEXA) 40 MG tablet Take 1 tablet (40 mg total)  by mouth daily. 05/15/20  Yes [name], MD  Incontinence Supply Disposable (DEPEND EASY FIT UNDERGARMENTS) MISC Use daily as directed. Change several times daily as needed. 06/29/18  Yes [name], [name], MD  lisdexamfetamine (VYVANSE) 50 MG capsule Take 1 capsule (50 mg total) by mouth daily. Ok to fill in 30 days 12/12/20  Yes [name], MD  lisdexamfetamine (VYVANSE) 50 MG capsule Take 1 capsule (50 mg total) by mouth daily. Ok to fill in 60 days 12/12/20  Yes [name], MD  losartan (COZAAR) 50 MG tablet TAKE 1 TABLET BY MOUTH EVERY DAY 12/24/20  Yes [name], MD  Multiple Vitamin (MULTIVITAMIN WITH MINERALS) TABS tablet Take 1 tablet  by mouth daily.   Yes [provider]  potassium chloride (KLOR-CON) 10 MEQ tablet Take 2 tablets (20 mEq total) by mouth daily. 12/16/20  Yes [name], MD  valACYclovir (VALTREX) 500 MG tablet Take 2 tablets by mouth at acute onset for outbreak, followed by 1 tablet once a day x 5 days 01/11/20  Yes [name], [name], MD  Vitamin D, Ergocalciferol, (DRISDOL) 1.25 MG (50000 UNIT) CAPS capsule Take 1 capsule (50,000 Units total) by mouth every 7 (seven) days. 12/16/20  Yes [name], MD  lisdexamfetamine (VYVANSE) 50 MG capsule Take 1 capsule (50 mg total) by mouth daily before breakfast. 12/12/20 01/11/21  [name], MD   Social History   Socioeconomic History   Marital status: Widowed    Spouse name: Not on file   Number of children: Not on file   Years of education: Not on file   Highest education level: Not on file  Occupational History   Not on file  Tobacco Use   Smoking status: Former    Packs/day: 1.00    Years: 7.00    Pack years: 7.00    Types: Cigarettes   Smokeless tobacco: Never   Tobacco comments:    some day smoker when she smoked  Substance and Sexual Activity   Alcohol use: No   Drug use: No   Sexual activity: Never  Other Topics Concern   Not on file  Social  History Narrative   Not on file   Social Determinants of Health   Financial Resource Strain: Not on file  Food Insecurity: Not on file  Transportation Needs: Not on file  Physical Activity: Not on file  Stress: Not on file  Social Connections: Not on file  Intimate Partner Violence: Not on file    Observations/Objective: No vitals for current visit.  Nontoxic appearance on video.  Speaking full sentences, no distress, no respiratory distress.  Did switch during part of visit to audio due to difficulty with connection of both video and audio at the same time.  Finished up at end of visit with video again.  All questions were answered with understanding of plan expressed.  Assessment and Plan: Hypokalemia  -Most recent labs stable, continue supplementation.  In office follow-up next few weeks and can consider repeat labs at that time if needed  Low serum vitamin D - Plan: Vitamin D, Ergocalciferol, (DRISDOL) 1.25 MG (50000 UNIT) CAPS capsule  -Stable on recent testing at 50,000 unit dosing.  Attention deficit hyperactivity disorder (ADHD), predominantly inattentive type  -Some decreased focus at times but would like to remain on same dose of medication for now.  Question stress/anxiety component but overall feels that she would like to remain on same medications.  Can discuss further at follow-up visit if needed.  Depression with anxiety As above would like to continue same dose of Celexa, buspirone with option of second dose.  Localized osteoporosis without current pathological fracture  -Continue vitamin D supplementation, recent level reassuring, continue follow-up as planned with endocrinology to review treatment options.  Essential hypertension, benign  -Denies new side effects or symptoms with medications.  Home monitoring recommended with handout given on management of hypertension.  Recheck in office next few weeks.  Cough  -New problem.  Possible laryngeal pharyngeal  reflux as some improvement with PPI.  Recommended Prilosec daily for now, information given on GERD and diet through MyChart after visit summary, and office follow-up next few weeks.  Follow Up  Instructions:  Next few weeks in office.    I discussed the assessment and treatment plan with the patient. The patient was provided an opportunity to ask questions and all were answered. The patient agreed with the plan and demonstrated an understanding of the instructions.   The patient was advised to call back or seek an in-person evaluation if the symptoms worsen or if the condition fails to improve as anticipated.  [name], MD

## 2021-01-16 NOTE — Patient Instructions (Addendum)
No change in meds for now.  Keep follow up as planned with endocrinology for osteoporosis treatment.  Keep a record of your blood pressures outside of the office and send the me the readings on Mychart.  Try prilosec once per day for now and recheck cough in next 2 weeks, see other information on reflux below. Follow up with me in next 2 weeks in office to discuss cough further. Sooner if worse. Follow up if any new symptoms or other concerns if those were not addressed today.    Food Choices for Gastroesophageal Reflux Disease, Adult When you have gastroesophageal reflux disease (GERD), the foods you eat and your eating habits are very important. Choosing the right foods can help ease your discomfort. Think about working with a food expert (dietitian) to help you make good choices. What are tips for following this plan? Reading food labels Look for foods that are low in saturated fat. Foods that may help with your symptoms include: Foods that have less than 5% of daily value (DV) of fat. Foods that have 0 grams of trans fat. Cooking Do not fry your food. Cook your food by baking, steaming, grilling, or broiling. These are all methods that do not need a lot of fat for cooking. To add flavor, try to use herbs that are low in spice and acidity. Meal planning  Choose healthy foods that are low in fat, such as: Fruits and vegetables. Whole grains. Low-fat dairy products. Lean meats, fish, and poultry. Eat small meals often instead of eating 3 large meals each day. Eat your meals slowly in a place where you are relaxed. Avoid bending over or lying down until 2-3 hours after eating. Limit high-fat foods such as fatty meats or fried foods. Limit your intake of fatty foods, such as oils, butter, and shortening. Avoid the following as told by your doctor: Foods that cause symptoms. These may be different for different people. Keep a food diary to keep track of foods that cause  symptoms. Alcohol. Drinking a lot of liquid with meals. Eating meals during the 2-3 hours before bed. Lifestyle Stay at a healthy weight. Ask your doctor what weight is healthy for you. If you need to lose weight, work with your doctor to do so safely. Exercise for at least 30 minutes on 5 or more days each week, or as told by your doctor. Wear loose-fitting clothes. Do not smoke or use any products that contain nicotine or tobacco. If you need help quitting, ask your doctor. Sleep with the head of your bed higher than your feet. Use a wedge under the mattress or blocks under the bed frame to raise the head of the bed. Chew sugar-free gum after meals. What foods should eat? Eat a healthy, well-balanced diet of fruits, vegetables, whole grains, low-fat dairy products, lean meats, fish, and poultry. Each person is different. Foods that may cause symptoms in one person may not cause any symptoms in another person. Work with your doctor to find foods that are safe for you. The items listed above may not be a complete list of what you can eat and drink. Contact a food expert for more options. What foods should I avoid? Limiting some of these foods may help in managing the symptoms of GERD. Everyone is different. Talk with a food expert or your doctor to help you find the exact foods to avoid, if any. Fruits Any fruits prepared with added fat. Any fruits that cause symptoms. For some people,  this may include citrus fruits, such as oranges, grapefruit, pineapple, and lemons. Vegetables Deep-fried vegetables. [place] fries. Any vegetables prepared with added fat. Any vegetables that cause symptoms. For some people, this may include tomatoes and tomato products, chili peppers, onions and garlic, and horseradish. Grains Pastries or quick breads with added fat. Meats and other proteins High-fat meats, such as fatty beef or pork, hot dogs, ribs, ham, sausage, salami, and bacon. Fried meat or protein,  including fried fish and fried chicken. Nuts and nut butters, in large amounts. Dairy Whole milk and chocolate milk. Sour cream. Cream. Ice cream. Cream cheese. Milkshakes. Fats and oils Butter. Margarine. Shortening. Ghee. Beverages Coffee and tea, with or without caffeine. Carbonated beverages. Sodas. Energy drinks. Fruit juice made with acidic fruits, such as orange or grapefruit. Tomato juice. Alcoholic drinks. Sweets and desserts Chocolate and cocoa. Donuts. Seasonings and condiments Pepper. Peppermint and spearmint. Added salt. Any condiments, herbs, or seasonings that cause symptoms. For some people, this may include curry, hot sauce, or vinegar-based salad dressings. The items listed above may not be a complete list of what you should not eat and drink. Contact a food expert for more options. Questions to ask your doctor Diet and lifestyle changes are often the first steps that are taken to manage symptoms of GERD. If diet and lifestyle changes do not help, talk with your doctor about taking medicines. Where to find more information International [employer] for Gastrointestinal Disorders: aboutgerd.org Summary When you have GERD, food and lifestyle choices are very important in easing your symptoms. Eat small meals often instead of 3 large meals a day. Eat your meals slowly and in a place where you are relaxed. Avoid bending over or lying down until 2-3 hours after eating. Limit high-fat foods such as fatty meats or fried foods. This information is not intended to replace advice given to you by your health care provider. Make sure you discuss any questions you have with your health care provider. Document Revised: 10/15/2019 Document Reviewed: 10/15/2019 Elsevier Patient Education  2022 [place] Your Hypertension Hypertension, also called high blood pressure, is when the force of the blood pressing against the walls of the arteries is too strong. Arteries are blood  vessels that carry blood from your heart throughout your body. Hypertension forces the heart to work harder to pump blood and may cause the arteries to become narrow or stiff. Understanding blood pressure readings Your personal target blood pressure may vary depending on your medical conditions, your age, and other factors. A blood pressure reading includes a higher number over a lower number. Ideally, your blood pressure should be below 120/80. You should know that: The first, or top, number is called the systolic pressure. It is a measure of the pressure in your arteries as your heart beats. The second, or bottom number, is called the diastolic pressure. It is a measure of the pressure in your arteries as the heart relaxes. Blood pressure is classified into four stages. Based on your blood pressure reading, your health care provider may use the following stages to determine what type of treatment you need, if any. Systolic pressure and diastolic pressure are measured in a unit called mmHg. Normal Systolic pressure: below 086. Diastolic pressure: below 80. Elevated Systolic pressure: 761-950. Diastolic pressure: below 80. Hypertension stage 1 Systolic pressure: 932-671. Diastolic pressure: 24-58. Hypertension stage 2 Systolic pressure: 099 or above. Diastolic pressure: 90 or above. How can this condition affect me? Managing your hypertension is  an important responsibility. Over time, hypertension can damage the arteries and decrease blood flow to important parts of the body, including the brain, heart, and kidneys. Having untreated or uncontrolled hypertension can lead to: A heart attack. A stroke. A weakened blood vessel (aneurysm). Heart failure. Kidney damage. Eye damage. Metabolic syndrome. Memory and concentration problems. Vascular dementia. What actions can I take to manage this condition? Hypertension can be managed by making lifestyle changes and possibly by taking medicines.  Your health care provider will help you make a plan to bring your blood pressure within a normal range. Nutrition  Eat a diet that is high in fiber and potassium, and low in salt (sodium), added sugar, and fat. An example eating plan is called the Dietary Approaches to Stop Hypertension (DASH) diet. To eat this way: Eat plenty of fresh fruits and vegetables. Try to fill one-half of your plate at each meal with fruits and vegetables. Eat whole grains, such as whole-wheat pasta, brown rice, or whole-grain bread. Fill about one-fourth of your plate with whole grains. Eat low-fat dairy products. Avoid fatty cuts of meat, processed or cured meats, and poultry with skin. Fill about one-fourth of your plate with lean proteins such as fish, chicken without skin, beans, eggs, and tofu. Avoid pre-made and processed foods. These tend to be higher in sodium, added sugar, and fat. Reduce your daily sodium intake. Most people with hypertension should eat less than 1,500 mg of sodium a day. Lifestyle  Work with your health care provider to maintain a healthy body weight or to lose weight. Ask what an ideal weight is for you. Get at least 30 minutes of exercise that causes your heart to beat faster (aerobic exercise) most days of the week. Activities may include walking, swimming, or biking. Include exercise to strengthen your muscles (resistance exercise), such as weight lifting, as part of your weekly exercise routine. Try to do these types of exercises for 30 minutes at least 3 days a week. Do not use any products that contain nicotine or tobacco, such as cigarettes, e-cigarettes, and chewing tobacco. If you need help quitting, ask your health care provider. Control any long-term (chronic) conditions you have, such as high cholesterol or diabetes. Identify your sources of stress and find ways to manage stress. This may include meditation, deep breathing, or making time for fun activities. Alcohol use Do not  drink alcohol if: Your health care provider tells you not to drink. You are pregnant, may be pregnant, or are planning to become pregnant. If you drink alcohol: Limit how much you use to: 0-1 drink a day for women. 0-2 drinks a day for men. Be aware of how much alcohol is in your drink. In the [place] one drink equals one 12 oz bottle of beer (355 mL), one 5 oz glass of wine (148 mL), or one 1 oz glass of hard liquor (44 mL). Medicines Your health care provider may prescribe medicine if lifestyle changes are not enough to get your blood pressure under control and if: Your systolic blood pressure is 130 or higher. Your diastolic blood pressure is 80 or higher. Take medicines only as told by your health care provider. Follow the directions carefully. Blood pressure medicines must be taken as told by your health care provider. The medicine does not work as well when you skip doses. Skipping doses also puts you at risk for problems. Monitoring Before you monitor your blood pressure: Do not smoke, drink caffeinated beverages, or exercise within 30  minutes before taking a measurement. Use the bathroom and empty your bladder (urinate). Sit quietly for at least 5 minutes before taking measurements. Monitor your blood pressure at home as told by your health care provider. To do this: Sit with your back straight and supported. Place your feet flat on the floor. Do not cross your legs. Support your arm on a flat surface, such as a table. Make sure your upper arm is at heart level. Each time you measure, take two or three readings one minute apart and record the results. You may also need to have your blood pressure checked regularly by your health care provider. General information Talk with your health care provider about your diet, exercise habits, and other lifestyle factors that may be contributing to hypertension. Review all the medicines you take with your health care provider because there may  be side effects or interactions. Keep all visits as told by your health care provider. Your health care provider can help you create and adjust your plan for managing your high blood pressure. Where to find more information National Heart, Lung, and Blood Institute: https://wilson-eaton.com/ American Heart Association: www.heart.org Contact a health care provider if: You think you are having a reaction to medicines you have taken. You have repeated (recurrent) headaches. You feel dizzy. You have swelling in your ankles. You have trouble with your vision. Get help right away if: You develop a severe headache or confusion. You have unusual weakness or numbness, or you feel faint. You have severe pain in your chest or abdomen. You vomit repeatedly. You have trouble breathing. These symptoms may represent a serious problem that is an emergency. Do not wait to see if the symptoms will go away. Get medical help right away. Call your local emergency services (911 in the [place] Do not drive yourself to the hospital. Summary Hypertension is when the force of blood pumping through your arteries is too strong. If this condition is not controlled, it may put you at risk for serious complications. Your personal target blood pressure may vary depending on your medical conditions, your age, and other factors. For most people, a normal blood pressure is less than 120/80. Hypertension is managed by lifestyle changes, medicines, or both. Lifestyle changes to help manage hypertension include losing weight, eating a healthy, low-sodium diet, exercising more, stopping smoking, and limiting alcohol. This information is not intended to replace advice given to you by your health care provider. Make sure you discuss any questions you have with your health care provider. Document Revised: 05/11/2019 Document Reviewed: 03/06/2019 Elsevier Patient Education  2022 [employer].

## 2021-01-29 ENCOUNTER — Encounter: Payer: Self-pay | Admitting: Family Medicine

## 2021-01-29 ENCOUNTER — Other Ambulatory Visit: Payer: Self-pay

## 2021-01-29 ENCOUNTER — Ambulatory Visit: Payer: BC Managed Care – PPO | Admitting: Family Medicine

## 2021-01-29 VITALS — BP 125/66 | HR 87 | Temp 97.8°F | Resp 17 | Ht 66.0 in | Wt 188.0 lb

## 2021-01-29 DIAGNOSIS — R058 Other specified cough: Secondary | ICD-10-CM

## 2021-01-29 DIAGNOSIS — F418 Other specified anxiety disorders: Secondary | ICD-10-CM

## 2021-01-29 DIAGNOSIS — R059 Cough, unspecified: Secondary | ICD-10-CM | POA: Diagnosis not present

## 2021-01-29 DIAGNOSIS — Z23 Encounter for immunization: Secondary | ICD-10-CM

## 2021-01-29 NOTE — Progress Notes (Signed)
Subjective:  Patient ID: Catherine Cole, female    DOB: 03/24/1956  Age: 65 y.o. MRN: [id number]  CC:  Chief Complaint  Patient presents with   Follow-up    Patient states she is in office today for a follow up from her video visit and more anxiety . PHQ9=7 Gad7=4.    HPI Catherine Cole presents for   Follow-up from video visit September 30.   Hypertension: Continued on losartan, chlorthalidone.  Recent labs noted.  Treated for hypokalemia and stable on August 12 labs. Home readings: none BP Readings from Last 3 Encounters:  01/29/21 125/66  10/16/20 140/82  05/15/20 (!) 144/83   Lab Results  Component Value Date   CREATININE 0.78 11/28/2020   Cough New problem on her September visit.  Possible laryngeal pharyngeal reflux as improved with PPI.  Recommend she continue PPI daily and handout given on GERD and trigger avoidance. Less cough - picked up PPI Rx today. Has not started. Still some cough after eating.   Depression with anxiety Discussed on the 30th, continued on same dose Celexa, BuSpar with option of second dose as improved anxiety during days of increased stressors with second dose of buspirone (2-3 days per week). Tired if taking dose for multiple days in a row. Marland Kitchen Hx of ADD, decreased focus discussed last visit, but thought to be anxiety component.  Situational stressors, having to move - lots in limbo. Upcoming wedding that she will be providing flowers.  Working from home - would like to go into office.   Depression screen [facility] 2/9 01/29/2021 01/16/2021 10/16/2020 04/02/2020 01/11/2020  Decreased Interest 1 0 2 0 0  Down, Depressed, Hopeless 2 0 3 0 0  PHQ - 2 Score 3 0 5 0 0  Altered sleeping 1 - 0 1 -  Tired, decreased energy 1 - 0 2 -  Change in appetite 0 - 0 0 -  Feeling bad or failure about yourself  0 - 0 0 -  Trouble concentrating 2 - 0 0 -  Moving slowly or fidgety/restless 0 - 0 0 -  Suicidal thoughts 0 - 0 0 -  PHQ-9 Score 7 - 5 3 -   Difficult doing work/chores Not difficult at all - Somewhat difficult - -  Some recent data might be hidden   GAD 7 : Generalized Anxiety Score 01/29/2021 04/02/2020 12/12/2018 09/29/2018  Nervous, Anxious, on Edge _0 Control/stop worrying 1 0 0 3  Worry too much - different things 1 0 0 3  Trouble relaxing 1 0 0 3  Restless 0 0 0 0  Easily annoyed or irritable 0 0 0 1  Afraid - awful might happen 0 0 0 0  Total GAD 7 Score _1 Anxiety Difficulty Not difficult at all - Not difficult at all -   HM: ? Due for colonoscopy - Dr. [name], she will call to determine if needed- hyperplastic polyp. Some FH of colon CA.   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   Family history of ovarian cancer    Family history of breast cancer    Family history of colon cancer    Family history of kidney cancer    Family history of melanoma    Vitamin D deficiency 12/12/2018   Essential hypertension, benign 10/15/2015  Kidney stone 05/02/2015   Palpitations 02/08/2015   Atypical chest pain 02/08/2015   Exertional dyspnea 02/08/2015   Lower extremity edema 02/08/2015   Mixed dyslipidemia 03/26/2014   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/03/2013   Depression with anxiety 04/26/2012   ADHD (attention deficit hyperactivity disorder) 04/26/2012   Past Medical History:  Diagnosis Date   Adult ADHD    Anemia    Anxiety    Cancer ([place])    Phreesia 94/80/1655   Complication of anesthesia    Depression    Depression    Phreesia 05/13/2020   Family history of breast cancer    Family history of colon cancer    Family history of kidney cancer    Family history of melanoma    Family history of ovarian cancer    Hypertension    IBS (irritable bowel syndrome)    PONV (postoperative nausea and vomiting)    Past Surgical History:   Procedure Laterality Date   ABDOMINAL HYSTERECTOMY N/A    Phreesia 05/13/2020   APPENDECTOMY     12 yrs   BREAST SURGERY N/A    Phreesia 05/13/2020   CHOLECYSTECTOMY  1995   CHOLESTEATOMA EXCISION     13 and 22 yrs   FRACTURE SURGERY     KIDNEY STONE SURGERY  1995   LESION DESTRUCTION N/A 12/03/2015   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;  Surgeon: [name], DO;  Location: [place];  Service: Plastics;  Laterality: N/A;   MASTECTOMY W/ SENTINEL NODE BIOPSY Bilateral 07/25/2019   Procedure: BILATERAL MASTECTOMY WITH RIGHT SENTINEL LYMPH NODE BIOPSY, LEFT RISK REDUCING MASTECTOMY;  Surgeon: [name], MD;  Location: [place];  Service: General;  Laterality: Bilateral;  PEC BLOCK   TONSILLECTOMY     15 yrs   Allergies  Allergen Reactions   Latex Hives   Demerol Nausea And Vomiting   Codeine Nausea And Vomiting   Erythromycin Itching and Rash   Neosporin [Neomycin-Polymyxin B Gu] Itching and Rash   Prior to Admission medications   Medication Sig Start Date End Date Taking? Authorizing Provider  busPIRone (BUSPAR) 10 MG tablet TAKE 1 TABLET BY MOUTH 2 TIMES DAILY AS NEEDED. 09/03/20  Yes [name], MD  chlorthalidone (HYGROTON) 25 MG tablet TAKE 1 TABLET (25 MG TOTAL) BY MOUTH DAILY. 12/30/20  Yes [name], MD  citalopram (CELEXA) 40 MG tablet Take 1 tablet (40 mg total) by mouth daily. 05/15/20  Yes [name], MD  Incontinence Supply Disposable (DEPEND EASY FIT UNDERGARMENTS) MISC Use daily as directed. Change several times daily as needed. 06/29/18  Yes [name], [name], MD  lisdexamfetamine (VYVANSE) 50 MG capsule Take 1 capsule (50 mg total) by mouth daily. Ok to fill in 30 days 12/12/20  Yes [name], MD  lisdexamfetamine (VYVANSE) 50 MG capsule Take 1 capsule (50 mg total) by mouth daily. Ok to fill in 60 days 12/12/20  Yes [name], MD  losartan (COZAAR) 50 MG  tablet TAKE 1 TABLET BY MOUTH EVERY DAY 12/24/20  Yes [name], MD  Multiple Vitamin (MULTIVITAMIN WITH MINERALS) TABS tablet Take 1 tablet by mouth daily.   Yes [provider]  potassium chloride (KLOR-CON) 10 MEQ tablet Take 2 tablets (20 mEq total) by mouth daily. 12/16/20  Yes [name], MD  valACYclovir (VALTREX) 500 MG tablet Take 2 tablets by mouth at acute onset for outbreak, followed by 1  tablet once a day x 5 days 01/11/20  Yes [name], [name], MD  Vitamin D, Ergocalciferol, (DRISDOL) 1.25 MG (50000 UNIT) CAPS capsule Take 1 capsule (50,000 Units total) by mouth every 7 (seven) days. 01/16/21  Yes [name], MD  lisdexamfetamine (VYVANSE) 50 MG capsule Take 1 capsule (50 mg total) by mouth daily before breakfast. 12/12/20 01/11/21  [name], MD   Social History   Socioeconomic History   Marital status: Widowed    Spouse name: Not on file   Number of children: Not on file   Years of education: Not on file   Highest education level: Not on file  Occupational History   Not on file  Tobacco Use   Smoking status: Former    Packs/day: 1.00    Years: 7.00    Pack years: 7.00    Types: Cigarettes   Smokeless tobacco: Never   Tobacco comments:    some day smoker when she smoked  Substance and Sexual Activity   Alcohol use: No   Drug use: No   Sexual activity: Never  Other Topics Concern   Not on file  Social History Narrative   Not on file   Social Determinants of Health   Financial Resource Strain: Not on file  Food Insecurity: Not on file  Transportation Needs: Not on file  Physical Activity: Not on file  Stress: Not on file  Social Connections: Not on file  Intimate Partner Violence: Not on file    Review of Systems   Objective:   Vitals:   01/29/21 1619  BP: 125/66  Pulse: 87  Resp: 17  Temp: 97.8 F (36.6 C)  TempSrc: Temporal  SpO2: 99%  Weight: 188 lb (85.3 kg)  Height: _0  (1.676 m)     Physical  Exam Constitutional:      Appearance: She is well-developed.  HENT:     Head: Normocephalic and atraumatic.     Right Ear: External ear normal.     Left Ear: External ear normal.  Eyes:     Conjunctiva/sclera: Conjunctivae normal.     Pupils: Pupils are equal, round, and reactive to light.  Neck:     Thyroid: No thyromegaly.  Cardiovascular:     Rate and Rhythm: Normal rate and regular rhythm.     Heart sounds: Normal heart sounds. No murmur heard. Pulmonary:     Effort: Pulmonary effort is normal. No respiratory distress.     Breath sounds: Normal breath sounds. No wheezing.  Abdominal:     General: Bowel sounds are normal.     Palpations: Abdomen is soft.     Tenderness: There is no abdominal tenderness.  Musculoskeletal:        General: No tenderness. Normal range of motion.     Cervical back: Normal range of motion and neck supple.  Lymphadenopathy:     Cervical: No cervical adenopathy.  Skin:    General: Skin is warm and dry.     Findings: No rash.  Neurological:     Mental Status: She is alert and oriented to person, place, and time.  Psychiatric:        Mood and Affect: Mood normal.        Behavior: Behavior normal.        Thought Content: Thought content normal.       Assessment & Plan:  Catherine Cole is a 65 y.o. female . Cough, unspecified type Upper airway cough syndrome  -Suspected  upper airway cough syndrome, trigger avoidance discussed, handout given.  Start PPI daily.  If improved, can taper dose to lowest effective dose, infrequent dosing.  If cough not resolved, return to discuss further work-up.  Depression with anxiety --Situational stressors likely impacting her focus, anxiety/depression symptoms.  We will try to slightly increase buspirone to 1 in the morning, one half at night for now.  Continue Celexa same dose.  Recheck 6 weeks  Needs flu shot - Plan: Flu Vaccine QUAD 73moIM (Fluarix, Fluzone & Alfiuria Quad PF)  Advised to discuss  timing of repeat colonoscopy with her gastroenterologist as above.   No orders of the defined types were placed in this encounter.  Patient Instructions  Restart omeprazole daily and if cough resolves can try taper to every other day if tolerated. If cough does not resolve, follow up to discuss cough further. Try to avoid heartburn trigger foods below.   Try increasing buspirone to 1 pill in am and 1/2 at night. Same dose of celexa for now. See info on stress management below.   Call Dr. MLorie Apleyoffice to determine if you are due for colonoscopy (our records indicate need to repeat study in 5 years.).   Managing Stress, Adult Feeling a certain amount of stress is normal. Stress helps our body and mind get ready to deal with the demands of life. Stress hormones can motivate you to do well at work and meet your responsibilities. However severe or long-lasting (chronic) stress can affect your mental and physical health. Chronic stress puts you at higher risk for anxiety, depression, and other health problems like digestive problems, muscle aches, heart disease, high blood pressure, and stroke. What are the causes? Common causes of stress include: Demands from work, such as deadlines, feeling overworked, or having long hours. Pressures at home, such as money issues, disagreements with a spouse, or parenting issues. Pressures from major life changes, such as divorce, moving, loss of a loved one, or chronic illness. You may be at higher risk for stress-related problems if you do not get enough sleep, are in poor health, do not have emotional support, or have a mental health disorder like anxiety or depression. How to recognize stress Stress can make you: Have trouble sleeping. Feel sad, anxious, irritable, or overwhelmed. Lose your appetite. Overeat or want to eat unhealthy foods. Want to use drugs or alcohol. Stress can also cause physical symptoms, such as: Sore, tense muscles, especially in  the shoulders and neck. Headaches. Trouble breathing. A faster heart rate. Stomach pain, nausea, or vomiting. Diarrhea or constipation. Trouble concentrating. Follow these instructions at home: Lifestyle Identify the source of your stress and your reaction to it. See a therapist who can help you change your reactions. When there are stressful events: Talk about it with family, friends, or co-workers. Try to think realistically about stressful events and not ignore them or overreact. Try to find the positives in a stressful situation and not focus on the negatives. Cut back on responsibilities at work and home, if possible. Ask for help from friends or family members if you need it. Find ways to cope with stress, such as: Meditation. Deep breathing. Yoga or tai chi. Progressive muscle relaxation. Doing art, playing music, or reading. Making time for fun activities. Spending time with family and friends. Get support from family, friends, or spiritual resources. Eating and drinking Eat a healthy diet. This includes: Eating foods that are high in fiber, such as beans, whole grains, and fresh fruits and  vegetables. Limiting foods that are high in fat and processed sugars, such as fried and sweet foods. Do not skip meals or overeat. Drink enough fluid to keep your urine pale yellow. Alcohol use Do not drink alcohol if: Your health care provider tells you not to drink. You are pregnant, may be pregnant, or are planning to become pregnant. Drinking alcohol is a way some people try to ease their stress. This can be dangerous, so if you drink alcohol: Limit how much you use to: 0-1 drink a day for women. 0-2 drinks a day for men. Be aware of how much alcohol is in your drink. In the [place] one drink equals one 12 oz bottle of beer (355 mL), one 5 oz glass of wine (148 mL), or one 1 oz glass of hard liquor (44 mL). Activity  Include 30 minutes of exercise in your daily schedule.  Exercise is a good stress reducer. Include time in your day for an activity that you find relaxing. Try taking a walk, going on a bike ride, reading a book, or listening to music. Schedule your time in a way that lowers stress, and keep a consistent schedule. Prioritize what is most important to get done. General instructions Get enough sleep. Try to go to sleep and get up at about the same time every day. Take over-the-counter and prescription medicines only as told by your health care provider. Do not use any products that contain nicotine or tobacco, such as cigarettes, e-cigarettes, and chewing tobacco. If you need help quitting, ask your health care provider. Do not use drugs or smoke to cope with stress. Keep all follow-up visits as told by your health care provider. This is important. Where to find support Talk with your health care provider about stress management or finding a support group. Find a therapist to work with you on your stress management techniques. Contact a health care provider if: Your stress symptoms get worse. You are unable to manage your stress at home. You are struggling to stop using drugs or alcohol. Get help right away if: You may be a danger to yourself or others. You have any thoughts of death or suicide. If you ever feel like you may hurt yourself or others, or have thoughts about taking your own life, get help right away. You can go to your nearest emergency department or call: Your local emergency services (911 in the [place] A suicide crisis helpline, such as the [facility] at 412 653 8804. This is open 24 hours a day. Summary Feeling a certain amount of stress is normal, but severe or long-lasting (chronic) stress can affect your mental and physical health. Chronic stress can put you at higher risk for anxiety, depression, and other health problems like digestive problems, muscle aches, heart disease, high blood pressure, and  stroke. You may be at higher risk for stress-related problems if you do not get enough sleep, are in poor health, lack emotional support, or have a mental health disorder like anxiety or depression. Identify the source of your stress and your reaction to it. Try talking about stressful events with family, friends, or co-workers, finding a coping method, or getting support from spiritual resources. If you need more help, talk with your health care provider about finding a support group or a mental health therapist. This information is not intended to replace advice given to you by your health care provider. Make sure you discuss any questions you have with your health care provider. Document  Revised: 06/13/2020 Document Reviewed: 11/01/2018 Elsevier Patient Education  2022 [place] for Gastroesophageal Reflux Disease, Adult When you have gastroesophageal reflux disease (GERD), the foods you eat and your eating habits are very important. Choosing the right foods can help ease your discomfort. Think about working with a food expert (dietitian) to help you make good choices. What are tips for following this plan? Reading food labels Look for foods that are low in saturated fat. Foods that may help with your symptoms include: Foods that have less than 5% of daily value (DV) of fat. Foods that have 0 grams of trans fat. Cooking Do not fry your food. Cook your food by baking, steaming, grilling, or broiling. These are all methods that do not need a lot of fat for cooking. To add flavor, try to use herbs that are low in spice and acidity. Meal planning  Choose healthy foods that are low in fat, such as: Fruits and vegetables. Whole grains. Low-fat dairy products. Lean meats, fish, and poultry. Eat small meals often instead of eating 3 large meals each day. Eat your meals slowly in a place where you are relaxed. Avoid bending over or lying down until 2-3 hours after  eating. Limit high-fat foods such as fatty meats or fried foods. Limit your intake of fatty foods, such as oils, butter, and shortening. Avoid the following as told by your doctor: Foods that cause symptoms. These may be different for different people. Keep a food diary to keep track of foods that cause symptoms. Alcohol. Drinking a lot of liquid with meals. Eating meals during the 2-3 hours before bed. Lifestyle Stay at a healthy weight. Ask your doctor what weight is healthy for you. If you need to lose weight, work with your doctor to do so safely. Exercise for at least 30 minutes on 5 or more days each week, or as told by your doctor. Wear loose-fitting clothes. Do not smoke or use any products that contain nicotine or tobacco. If you need help quitting, ask your doctor. Sleep with the head of your bed higher than your feet. Use a wedge under the mattress or blocks under the bed frame to raise the head of the bed. Chew sugar-free gum after meals. What foods should eat? Eat a healthy, well-balanced diet of fruits, vegetables, whole grains, low-fat dairy products, lean meats, fish, and poultry. Each person is different. Foods that may cause symptoms in one person may not cause any symptoms in another person. Work with your doctor to find foods that are safe for you. The items listed above may not be a complete list of what you can eat and drink. Contact a food expert for more options. What foods should I avoid? Limiting some of these foods may help in managing the symptoms of GERD. Everyone is different. Talk with a food expert or your doctor to help you find the exact foods to avoid, if any. Fruits Any fruits prepared with added fat. Any fruits that cause symptoms. For some people, this may include citrus fruits, such as oranges, grapefruit, pineapple, and lemons. Vegetables Deep-fried vegetables. [place] fries. Any vegetables prepared with added fat. Any vegetables that cause symptoms. For  some people, this may include tomatoes and tomato products, chili peppers, onions and garlic, and horseradish. Grains Pastries or quick breads with added fat. Meats and other proteins High-fat meats, such as fatty beef or pork, hot dogs, ribs, ham, sausage, salami, and bacon. Fried meat or protein,  including fried fish and fried chicken. Nuts and nut butters, in large amounts. Dairy Whole milk and chocolate milk. Sour cream. Cream. Ice cream. Cream cheese. Milkshakes. Fats and oils Butter. Margarine. Shortening. Ghee. Beverages Coffee and tea, with or without caffeine. Carbonated beverages. Sodas. Energy drinks. Fruit juice made with acidic fruits, such as orange or grapefruit. Tomato juice. Alcoholic drinks. Sweets and desserts Chocolate and cocoa. Donuts. Seasonings and condiments Pepper. Peppermint and spearmint. Added salt. Any condiments, herbs, or seasonings that cause symptoms. For some people, this may include curry, hot sauce, or vinegar-based salad dressings. The items listed above may not be a complete list of what you should not eat and drink. Contact a food expert for more options. Questions to ask your doctor Diet and lifestyle changes are often the first steps that are taken to manage symptoms of GERD. If diet and lifestyle changes do not help, talk with your doctor about taking medicines. Where to find more information International Foundation for Gastrointestinal Disorders: aboutgerd.org Summary When you have GERD, food and lifestyle choices are very important in easing your symptoms. Eat small meals often instead of 3 large meals a day. Eat your meals slowly and in a place where you are relaxed. Avoid bending over or lying down until 2-3 hours after eating. Limit high-fat foods such as fatty meats or fried foods. This information is not intended to replace advice given to you by your health care provider. Make sure you discuss any questions you have with your health care  provider. Document Revised: 10/15/2019 Document Reviewed: 10/15/2019 Elsevier Patient Education  2022 [place],   Merri Ray, MD [place] 01/29/21 5:52 PM

## 2021-01-29 NOTE — Patient Instructions (Addendum)
Restart omeprazole daily and if cough resolves can try taper to every other day if tolerated. If cough does not resolve, follow up to discuss cough further. Try to avoid heartburn trigger foods below.   Try increasing buspirone to 1 pill in am and 1/2 at night. Same dose of celexa for now. See info on stress management below.   Call Dr. Lorie Apley office to determine if you are due for colonoscopy (our records indicate need to repeat study in 5 years.).   Managing Stress, Adult Feeling a certain amount of stress is normal. Stress helps our body and mind get ready to deal with the demands of life. Stress hormones can motivate you to do well at work and meet your responsibilities. However severe or long-lasting (chronic) stress can affect your mental and physical health. Chronic stress puts you at higher risk for anxiety, depression, and other health problems like digestive problems, muscle aches, heart disease, high blood pressure, and stroke. What are the causes? Common causes of stress include: Demands from work, such as deadlines, feeling overworked, or having long hours. Pressures at home, such as money issues, disagreements with a spouse, or parenting issues. Pressures from major life changes, such as divorce, moving, loss of a loved one, or chronic illness. You may be at higher risk for stress-related problems if you do not get enough sleep, are in poor health, do not have emotional support, or have a mental health disorder like anxiety or depression. How to recognize stress Stress can make you: Have trouble sleeping. Feel sad, anxious, irritable, or overwhelmed. Lose your appetite. Overeat or want to eat unhealthy foods. Want to use drugs or alcohol. Stress can also cause physical symptoms, such as: Sore, tense muscles, especially in the shoulders and neck. Headaches. Trouble breathing. A faster heart rate. Stomach pain, nausea, or vomiting. Diarrhea or constipation. Trouble  concentrating. Follow these instructions at home: Lifestyle Identify the source of your stress and your reaction to it. See a therapist who can help you change your reactions. When there are stressful events: Talk about it with family, friends, or co-workers. Try to think realistically about stressful events and not ignore them or overreact. Try to find the positives in a stressful situation and not focus on the negatives. Cut back on responsibilities at work and home, if possible. Ask for help from friends or family members if you need it. Find ways to cope with stress, such as: Meditation. Deep breathing. Yoga or tai chi. Progressive muscle relaxation. Doing art, playing music, or reading. Making time for fun activities. Spending time with family and friends. Get support from family, friends, or spiritual resources. Eating and drinking Eat a healthy diet. This includes: Eating foods that are high in fiber, such as beans, whole grains, and fresh fruits and vegetables. Limiting foods that are high in fat and processed sugars, such as fried and sweet foods. Do not skip meals or overeat. Drink enough fluid to keep your urine pale yellow. Alcohol use Do not drink alcohol if: Your health care provider tells you not to drink. You are pregnant, may be pregnant, or are planning to become pregnant. Drinking alcohol is a way some people try to ease their stress. This can be dangerous, so if you drink alcohol: Limit how much you use to: 0-1 drink a day for women. 0-2 drinks a day for men. Be aware of how much alcohol is in your drink. In the [place] one drink equals one 12 oz bottle of beer (355  mL), one 5 oz glass of wine (148 mL), or one 1 oz glass of hard liquor (44 mL). Activity  Include 30 minutes of exercise in your daily schedule. Exercise is a good stress reducer. Include time in your day for an activity that you find relaxing. Try taking a walk, going on a bike ride, reading a book,  or listening to music. Schedule your time in a way that lowers stress, and keep a consistent schedule. Prioritize what is most important to get done. General instructions Get enough sleep. Try to go to sleep and get up at about the same time every day. Take over-the-counter and prescription medicines only as told by your health care provider. Do not use any products that contain nicotine or tobacco, such as cigarettes, e-cigarettes, and chewing tobacco. If you need help quitting, ask your health care provider. Do not use drugs or smoke to cope with stress. Keep all follow-up visits as told by your health care provider. This is important. Where to find support Talk with your health care provider about stress management or finding a support group. Find a therapist to work with you on your stress management techniques. Contact a health care provider if: Your stress symptoms get worse. You are unable to manage your stress at home. You are struggling to stop using drugs or alcohol. Get help right away if: You may be a danger to yourself or others. You have any thoughts of death or suicide. If you ever feel like you may hurt yourself or others, or have thoughts about taking your own life, get help right away. You can go to your nearest emergency department or call: Your local emergency services (911 in the [place] A suicide crisis helpline, such as the [facility] at 352-038-8208. This is open 24 hours a day. Summary Feeling a certain amount of stress is normal, but severe or long-lasting (chronic) stress can affect your mental and physical health. Chronic stress can put you at higher risk for anxiety, depression, and other health problems like digestive problems, muscle aches, heart disease, high blood pressure, and stroke. You may be at higher risk for stress-related problems if you do not get enough sleep, are in poor health, lack emotional support, or have a mental  health disorder like anxiety or depression. Identify the source of your stress and your reaction to it. Try talking about stressful events with family, friends, or co-workers, finding a coping method, or getting support from spiritual resources. If you need more help, talk with your health care provider about finding a support group or a mental health therapist. This information is not intended to replace advice given to you by your health care provider. Make sure you discuss any questions you have with your health care provider. Document Revised: 06/13/2020 Document Reviewed: 11/01/2018 Elsevier Patient Education  2022 [place] for Gastroesophageal Reflux Disease, Adult When you have gastroesophageal reflux disease (GERD), the foods you eat and your eating habits are very important. Choosing the right foods can help ease your discomfort. Think about working with a food expert (dietitian) to help you make good choices. What are tips for following this plan? Reading food labels Look for foods that are low in saturated fat. Foods that may help with your symptoms include: Foods that have less than 5% of daily value (DV) of fat. Foods that have 0 grams of trans fat. Cooking Do not fry your food. Cook your food by baking, steaming,  grilling, or broiling. These are all methods that do not need a lot of fat for cooking. To add flavor, try to use herbs that are low in spice and acidity. Meal planning  Choose healthy foods that are low in fat, such as: Fruits and vegetables. Whole grains. Low-fat dairy products. Lean meats, fish, and poultry. Eat small meals often instead of eating 3 large meals each day. Eat your meals slowly in a place where you are relaxed. Avoid bending over or lying down until 2-3 hours after eating. Limit high-fat foods such as fatty meats or fried foods. Limit your intake of fatty foods, such as oils, butter, and shortening. Avoid the following as  told by your doctor: Foods that cause symptoms. These may be different for different people. Keep a food diary to keep track of foods that cause symptoms. Alcohol. Drinking a lot of liquid with meals. Eating meals during the 2-3 hours before bed. Lifestyle Stay at a healthy weight. Ask your doctor what weight is healthy for you. If you need to lose weight, work with your doctor to do so safely. Exercise for at least 30 minutes on 5 or more days each week, or as told by your doctor. Wear loose-fitting clothes. Do not smoke or use any products that contain nicotine or tobacco. If you need help quitting, ask your doctor. Sleep with the head of your bed higher than your feet. Use a wedge under the mattress or blocks under the bed frame to raise the head of the bed. Chew sugar-free gum after meals. What foods should eat? Eat a healthy, well-balanced diet of fruits, vegetables, whole grains, low-fat dairy products, lean meats, fish, and poultry. Each person is different. Foods that may cause symptoms in one person may not cause any symptoms in another person. Work with your doctor to find foods that are safe for you. The items listed above may not be a complete list of what you can eat and drink. Contact a food expert for more options. What foods should I avoid? Limiting some of these foods may help in managing the symptoms of GERD. Everyone is different. Talk with a food expert or your doctor to help you find the exact foods to avoid, if any. Fruits Any fruits prepared with added fat. Any fruits that cause symptoms. For some people, this may include citrus fruits, such as oranges, grapefruit, pineapple, and lemons. Vegetables Deep-fried vegetables. [place] fries. Any vegetables prepared with added fat. Any vegetables that cause symptoms. For some people, this may include tomatoes and tomato products, chili peppers, onions and garlic, and horseradish. Grains Pastries or quick breads with added  fat. Meats and other proteins High-fat meats, such as fatty beef or pork, hot dogs, ribs, ham, sausage, salami, and bacon. Fried meat or protein, including fried fish and fried chicken. Nuts and nut butters, in large amounts. Dairy Whole milk and chocolate milk. Sour cream. Cream. Ice cream. Cream cheese. Milkshakes. Fats and oils Butter. Margarine. Shortening. Ghee. Beverages Coffee and tea, with or without caffeine. Carbonated beverages. Sodas. Energy drinks. Fruit juice made with acidic fruits, such as orange or grapefruit. Tomato juice. Alcoholic drinks. Sweets and desserts Chocolate and cocoa. Donuts. Seasonings and condiments Pepper. Peppermint and spearmint. Added salt. Any condiments, herbs, or seasonings that cause symptoms. For some people, this may include curry, hot sauce, or vinegar-based salad dressings. The items listed above may not be a complete list of what you should not eat and drink. Contact a food expert for  more options. Questions to ask your doctor Diet and lifestyle changes are often the first steps that are taken to manage symptoms of GERD. If diet and lifestyle changes do not help, talk with your doctor about taking medicines. Where to find more information International Foundation for Gastrointestinal Disorders: aboutgerd.org Summary When you have GERD, food and lifestyle choices are very important in easing your symptoms. Eat small meals often instead of 3 large meals a day. Eat your meals slowly and in a place where you are relaxed. Avoid bending over or lying down until 2-3 hours after eating. Limit high-fat foods such as fatty meats or fried foods. This information is not intended to replace advice given to you by your health care provider. Make sure you discuss any questions you have with your health care provider. Document Revised: 10/15/2019 Document Reviewed: 10/15/2019 Elsevier Patient Education  [place].

## 2021-01-31 ENCOUNTER — Other Ambulatory Visit: Payer: Self-pay | Admitting: Family Medicine

## 2021-02-04 ENCOUNTER — Ambulatory Visit: Payer: BC Managed Care – PPO | Attending: Surgery

## 2021-02-04 ENCOUNTER — Other Ambulatory Visit: Payer: Self-pay

## 2021-02-04 DIAGNOSIS — D0511 Intraductal carcinoma in situ of right breast: Secondary | ICD-10-CM | POA: Insufficient documentation

## 2021-02-04 DIAGNOSIS — I890 Lymphedema, not elsewhere classified: Secondary | ICD-10-CM | POA: Insufficient documentation

## 2021-02-04 NOTE — Therapy (Signed)
[facility] @ [place], [place], [address] Phone: [phone]   Fax:  [phone]  Physical Therapy Evaluation  Patient Details  Name: Catherine Cole MRN: [id number] Date of Birth: 1955-05-05 Referring Provider (PT): Dr. Brantley Stage   Encounter Date: 02/04/2021   PT End of Session - 02/04/21 1051     Visit Number 1    Number of Visits 9    Date for PT Re-Evaluation 03/04/21    PT Start Time 0924    PT Stop Time 1020    PT Time Calculation (min) 56 min    Activity Tolerance Patient tolerated treatment well    Behavior During Therapy [facility] for tasks assessed/performed             Past Medical History:  Diagnosis Date   Adult ADHD    Anemia    Anxiety    Cancer ([place])    Phreesia [id number]/0177   Complication of anesthesia    Depression    Depression    Phreesia 05/13/2020   Family history of breast cancer    Family history of colon cancer    Family history of kidney cancer    Family history of melanoma    Family history of ovarian cancer    Hypertension    IBS (irritable bowel syndrome)    PONV (postoperative nausea and vomiting)     Past Surgical History:  Procedure Laterality Date   ABDOMINAL HYSTERECTOMY N/A    Phreesia 05/13/2020   APPENDECTOMY     12 yrs   BREAST SURGERY N/A    Phreesia 05/13/2020   CHOLECYSTECTOMY  1995   CHOLESTEATOMA EXCISION     13 and 22 yrs   [place] N/A 12/03/2015   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;  Surgeon: [name], DO;  Location: [facility];  Service: Plastics;  Laterality: N/A;   MASTECTOMY W/ SENTINEL NODE BIOPSY Bilateral 07/25/2019   Procedure: BILATERAL MASTECTOMY WITH RIGHT SENTINEL LYMPH NODE BIOPSY, LEFT RISK REDUCING MASTECTOMY;  Surgeon: [name], MD;  Location: [facility];  Service: General;  Laterality:  Bilateral;  PEC BLOCK   TONSILLECTOMY     15 yrs    There were no vitals filed for this visit.    Subjective Assessment - 02/04/21 1045     Subjective Pt reports bilateral arm swelling but has never had LN's removed from the left.  She has bilateral compression sleeves and compression bra and camisoles. I am back to work, but working from home. If she overheats she swells up. She doesn't wear compression all the time because she overheats. Doesn't wear garments at night. Wants to be remeasured. Has a Juzo Size 3 class 1 sleeve and gauntlets.  If she doesn't wear guantlet her hand swells.    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 0/2 lymph nodes positive. If she overheats she gets a dry cough and chokes, IBS, prior brain tumor, ADD    Currently in Pain? No/denies    Pain Score 0-No pain    Aggravating Factors  heat aggravates swelling                [facility] PT Assessment - 02/04/21 0001       Assessment   Medical Diagnosis lymphedema    Referring Provider (PT)  Dr. Truddie Hidden Dominance Right    Next MD Visit Monday    Prior Therapy yes      Precautions   Precaution Comments lymphedema      Restrictions   Weight Bearing Restrictions No      Balance Screen   Has the patient fallen in the past 6 months No    Has the patient had a decrease in activity level because of a fear of falling?  No    Is the patient reluctant to leave their home because of a fear of falling?  No      Home Environment   Living Environment Private residence    Living Arrangements Alone    Available Help at Discharge Family    Type of [place] to enter      Prior Function   Level of Independence Independent    Vocation Full time employment    Vocation Requirements works for [employer] work from home    Leisure games like scrabble, reading,      Cognition   Overall Cognitive Status Within Functional  Limits for tasks assessed      Observation/Other Assessments   Observations incisions healed, mild swelling noted distal to mastectomy incisions at rib cage area, and per pt into stomach at times . Prominent nodular area noted left chest.  Pt. Indicates is a Sebaceous cys   Skin Integrity WNL      Posture/Postural Control   Posture/Postural Control Postural limitations    Postural Limitations Rounded Shoulders;Forward head      AROM   AROM Assessment Site --   bilateral shoulder Flex and abd WNL with min end range tightness              LYMPHEDEMA/ONCOLOGY QUESTIONNAIRE - 02/04/21 0001       Surgeries   Mastectomy Date 07/25/19      Treatment   Active Chemotherapy Treatment No    Past Chemotherapy Treatment No    Active Radiation Treatment No    Past Radiation Treatment No    Current Hormone Treatment No    Past Hormone Therapy No      What other symptoms do you have   Are you having Pain --   right chest inferior to incision     Right Upper Extremity Lymphedema   15 cm Proximal to Olecranon Process 32.2 cm    10 cm Proximal to Olecranon Process 30.4 cm    Olecranon Process 27.5 cm    15 cm Proximal to Ulnar Styloid Process 26.5 cm    10 cm Proximal to Ulnar Styloid Process 23.3 cm    Just Proximal to Ulnar Styloid Process 16.4 cm    Across Hand at [employer] 20.1 cm    At [place] of 2nd Digit 6 cm      Left Upper Extremity Lymphedema   15 cm Proximal to Olecranon Process 32.7 cm    10 cm Proximal to Olecranon Process 30.8 cm    Olecranon Process 28 cm    15 cm Proximal to Ulnar Styloid Process 26.3 cm    10 cm Proximal to Ulnar Styloid Process 23.4 cm    Just Proximal to Ulnar Styloid Process 16.4 cm    Across Hand at [employer] 20 cm    At [place] of 2nd Digit 6.2 cm  Objective measurements completed on examination: See above findings.                PT Education - 02/04/21 1050     Education Details  Discussed flexitouch with pt. pt agreeable to having benefits checked.  Will send today    Person(s) Educated Patient    Methods Explanation    Comprehension Verbalized understanding                    PT Long Term Goals - 02/04/21 1104       PT LONG TERM GOAL #1   Title Pt will be independent in self MLD    Time 4    Period Weeks    Status New    Target Date 03/04/21      PT LONG TERM GOAL #2   Title Pt will note improvement in trunk/UE swelling by 50%    Time 4    Period Weeks    Status New    Target Date 03/04/21      PT LONG TERM GOAL #3   Title Pt will be educated in a HEP for UE strengthening/    Time 4    Period Weeks    Status New    Target Date 03/03/21                    Plan - 02/04/21 1051     Clinical Impression Statement Pt was previously treated for lymphedema of bilateral UE's and trunk from 08/30/2019-11/12/2019.  Despite over 4 weeks of conservative therapies wearing compression sleeves, and skin brushing/MLD to promote lymphatic flow she continues with hyperplasia and increased swelling in the trunk area especially distal to her mastectomy incisions at the rib cage and into abdominals and feels the left upper arm is more swollen.  She will benefit from a Flexitouch compression pump to reduce trunkal and UE swelling to prevent further exacerbations.  She feels that lymphedema causes her to put her life on hold. Demographics sheet will be sent to [employer] (pt has approved) today. She will also benefit from therapy to review MLD, and to check sleeves to see if they require replacing yet.    Personal Factors and Comorbidities Comorbidity 1    Comorbidities Bil masectomy with 2 lymph node removal on the R    Stability/Clinical Decision Making Stable/Uncomplicated    Rehab Potential Good    PT Frequency 2x / week    PT Duration 4 weeks    PT Treatment/Interventions ADLs/Self Care Home Management;Therapeutic exercise;Manual  techniques;Patient/family education;Manual lymph drainage    PT Next Visit Plan 30 sec sit to stand, MLD to UE's/trunk and review with pt, check sleeve fit and see if they need to be replaced yet    Recommended Other Services Flexitouch.  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   Family history of ovarian cancer    Family  history of breast cancer    Family history of colon cancer    Family history of kidney cancer    Family history of melanoma    Vitamin D deficiency 12/12/2018   Essential hypertension, benign 10/15/2015   Kidney stone 05/02/2015   Palpitations 02/08/2015   Atypical chest pain 02/08/2015   Exertional dyspnea 02/08/2015   Lower extremity edema 02/08/2015   Mixed dyslipidemia 03/26/2014   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/03/2013   Depression with anxiety 04/26/2012   ADHD (attention deficit hyperactivity disorder) 04/26/2012    Claris Pong, PT 02/04/2021, 11:11 AM  [facility] [place], [place], [address] Phone: [phone]   Fax:  [phone]  Name: Catherine Cole MRN: [id number] Date of Birth: [birth year]

## 2021-02-17 ENCOUNTER — Ambulatory Visit: Payer: BC Managed Care – PPO

## 2021-02-20 ENCOUNTER — Ambulatory Visit: Payer: BC Managed Care – PPO | Attending: Surgery

## 2021-02-20 ENCOUNTER — Other Ambulatory Visit: Payer: Self-pay

## 2021-02-20 DIAGNOSIS — D0511 Intraductal carcinoma in situ of right breast: Secondary | ICD-10-CM | POA: Insufficient documentation

## 2021-02-20 DIAGNOSIS — M25612 Stiffness of left shoulder, not elsewhere classified: Secondary | ICD-10-CM | POA: Insufficient documentation

## 2021-02-20 DIAGNOSIS — M25511 Pain in right shoulder: Secondary | ICD-10-CM | POA: Insufficient documentation

## 2021-02-20 DIAGNOSIS — M25611 Stiffness of right shoulder, not elsewhere classified: Secondary | ICD-10-CM | POA: Diagnosis present

## 2021-02-20 DIAGNOSIS — I890 Lymphedema, not elsewhere classified: Secondary | ICD-10-CM | POA: Insufficient documentation

## 2021-02-20 DIAGNOSIS — R600 Localized edema: Secondary | ICD-10-CM | POA: Diagnosis present

## 2021-02-20 DIAGNOSIS — M25512 Pain in left shoulder: Secondary | ICD-10-CM | POA: Insufficient documentation

## 2021-02-20 NOTE — Therapy (Signed)
[facility] @ [place], [place], [address] Phone: [phone]   Fax:  [phone]  Physical Therapy Treatment  Patient Details  Name: Catherine Cole MRN: [id number] Date of Birth: 03/13/1956 Referring Provider (PT): Dr. Brantley Stage   Encounter Date: 02/20/2021   PT End of Session - 02/20/21 1233     Visit Number 2    Number of Visits 9    Date for PT Re-Evaluation 03/04/21    PT Start Time 1114    PT Stop Time 1212    PT Time Calculation (min) 58 min    Activity Tolerance Patient tolerated treatment well    Behavior During Therapy [facility] for tasks assessed/performed             Past Medical History:  Diagnosis Date   Adult ADHD    Anemia    Anxiety    Cancer ([place])    Phreesia 47/65/4650   Complication of anesthesia    Depression    Depression    Phreesia 05/13/2020   Family history of breast cancer    Family history of colon cancer    Family history of kidney cancer    Family history of melanoma    Family history of ovarian cancer    Hypertension    IBS (irritable bowel syndrome)    PONV (postoperative nausea and vomiting)     Past Surgical History:  Procedure Laterality Date   ABDOMINAL HYSTERECTOMY N/A    Phreesia 05/13/2020   APPENDECTOMY     12 yrs   BREAST SURGERY N/A    Phreesia 05/13/2020   CHOLECYSTECTOMY  1995   CHOLESTEATOMA EXCISION     13 and 22 yrs   [place] N/A 12/03/2015   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;  Surgeon: [name], DO;  Location: [facility] [place];  Service: Plastics;  Laterality: N/A;   MASTECTOMY W/ SENTINEL NODE BIOPSY Bilateral 07/25/2019   Procedure: BILATERAL MASTECTOMY WITH RIGHT SENTINEL LYMPH NODE BIOPSY, LEFT RISK REDUCING MASTECTOMY;  Surgeon: [name], MD;  Location: [facility] [place] [facility];  Service: General;  Laterality:  Bilateral;  PEC BLOCK   TONSILLECTOMY     15 yrs    There were no vitals filed for this visit.   Subjective Assessment - 02/20/21 1124     Subjective Nothing new since eval.    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 0/2 lymph nodes positive. If she overheats she gets a dry cough and chokes, IBS, prior brain tumor, ADD    Patient Stated Goals address lymphedema to get normal life back.    Currently in Pain? No/denies                               [facility] Adult PT Treatment/Exercise - 02/20/21 0001       Manual Therapy   Manual Therapy Manual Lymphatic Drainage (MLD)    Manual Lymphatic Drainage (MLD) In Supine: Short neck, superficial and deep abdominals, Lt inguinal nodes, Lt axillo-inguinal anastomosis, then Lt UE working proximal from distal then retracing all steps, then did the same on Rt side                          PT  Long Term Goals - 02/04/21 1104       PT LONG TERM GOAL #1   Title Pt will be independent in self MLD    Time 4    Period Weeks    Status New    Target Date 03/04/21      PT LONG TERM GOAL #2   Title Pt will note improvement in trunk/UE swelling by 50%    Time 4    Period Weeks    Status New    Target Date 03/04/21      PT LONG TERM GOAL #3   Title Pt will be educated in a HEP for UE strengthening/    Time 4    Period Weeks    Status New    Target Date 03/03/21                   Plan - 02/20/21 1233     Clinical Impression Statement This was pts first session since evaluation so focused on resuming manual lymph drainage to bil UE's. Pt sees evaluating PT at next session so sit-stand can be assessed at that time to determine if goal needs to be added.    Personal Factors and Comorbidities Comorbidity 1    Comorbidities Bil masectomy with 2 lymph node removal on the R    Stability/Clinical Decision Making Stable/Uncomplicated    Rehab  Potential Good    PT Frequency 2x / week    PT Duration 4 weeks    PT Treatment/Interventions ADLs/Self Care Home Management;Therapeutic exercise;Manual techniques;Patient/family education;Manual lymph drainage    PT Next Visit Plan 30 sec sit to stand, MLD to UE's/trunk and review with pt, check sleeve fit and see if they need to be replaced yet    Consulted and Agree with Plan of Care Patient             Patient will benefit from skilled therapeutic intervention in order to improve the following deficits and impairments:  Decreased knowledge of precautions, Increased edema, Postural dysfunction  Visit Diagnosis: Ductal carcinoma in situ (DCIS) of right breast  Lymphedema, not elsewhere classified     Problem List Patient Active Problem List   Diagnosis Date Noted   Genetic testing 08/03/2019   Breast cancer, right ([place]) 07/25/2019   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   Family history of ovarian cancer    Family history of breast cancer    Family history of colon cancer    Family history of kidney cancer    Family history of melanoma    Vitamin D deficiency 12/12/2018   Essential hypertension, benign 10/15/2015   Kidney stone 05/02/2015   Palpitations 02/08/2015   Atypical chest pain 02/08/2015   Exertional dyspnea 02/08/2015   Lower extremity edema 02/08/2015   Mixed dyslipidemia 03/26/2014   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/03/2013   Depression with anxiety 04/26/2012   ADHD (attention deficit hyperactivity disorder) 04/26/2012    Otelia Limes, PTA 02/20/2021, 12:37 PM  [facility] @ [place], [place], [address] Phone: [phone]   Fax:  [phone]  Name: Neyah Ellerman MRN: [id number] Date of Birth: 01-27-1956

## 2021-02-24 ENCOUNTER — Ambulatory Visit: Payer: BC Managed Care – PPO

## 2021-02-24 ENCOUNTER — Other Ambulatory Visit: Payer: Self-pay

## 2021-02-24 DIAGNOSIS — D0511 Intraductal carcinoma in situ of right breast: Secondary | ICD-10-CM | POA: Diagnosis not present

## 2021-02-24 DIAGNOSIS — R600 Localized edema: Secondary | ICD-10-CM

## 2021-02-24 DIAGNOSIS — I890 Lymphedema, not elsewhere classified: Secondary | ICD-10-CM

## 2021-02-24 DIAGNOSIS — M25512 Pain in left shoulder: Secondary | ICD-10-CM

## 2021-02-24 DIAGNOSIS — M25611 Stiffness of right shoulder, not elsewhere classified: Secondary | ICD-10-CM

## 2021-02-24 DIAGNOSIS — M25612 Stiffness of left shoulder, not elsewhere classified: Secondary | ICD-10-CM

## 2021-02-24 NOTE — Therapy (Signed)
[facility] @ [place], [place], [address] Phone: [phone]   Fax:  [phone]  Physical Therapy Treatment  Patient Details  Name: Catherine Cole MRN: [id number] Date of Birth: 1955-10-28 Referring Provider (PT): Dr. Brantley Stage   Encounter Date: 02/24/2021   PT End of Session - 02/24/21 1449     Visit Number 3    Number of Visits 9    Date for PT Re-Evaluation 03/04/21    PT Start Time 1324    PT Stop Time 1448    PT Time Calculation (min) 41 min    Activity Tolerance Patient tolerated treatment well    Behavior During Therapy [facility] for tasks assessed/performed             Past Medical History:  Diagnosis Date   Adult ADHD    Anemia    Anxiety    Cancer ([place])    Phreesia 40/01/2724   Complication of anesthesia    Depression    Depression    Phreesia 05/13/2020   Family history of breast cancer    Family history of colon cancer    Family history of kidney cancer    Family history of melanoma    Family history of ovarian cancer    Hypertension    IBS (irritable bowel syndrome)    PONV (postoperative nausea and vomiting)     Past Surgical History:  Procedure Laterality Date   ABDOMINAL HYSTERECTOMY N/A    Phreesia 05/13/2020   APPENDECTOMY     12 yrs   BREAST SURGERY N/A    Phreesia 05/13/2020   CHOLECYSTECTOMY  1995   CHOLESTEATOMA EXCISION     13 and 22 yrs   [place] N/A 12/03/2015   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;  Surgeon: [name], DO;  Location: [facility];  Service: Plastics;  Laterality: N/A;   MASTECTOMY W/ SENTINEL NODE BIOPSY Bilateral 07/25/2019   Procedure: BILATERAL MASTECTOMY WITH RIGHT SENTINEL LYMPH NODE BIOPSY, LEFT RISK REDUCING MASTECTOMY;  Surgeon: [name], MD;  Location: [facility];  Service: General;  Laterality:  Bilateral;  PEC BLOCK   TONSILLECTOMY     15 yrs    There were no vitals filed for this visit.   Subjective Assessment - 02/24/21 1406     Subjective Did well after last visit.  It felt good after last visit and maybe a little lighter. I did hear from [employer] and she thinks it might be better to wait until she turns 51.    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 0/2 lymph nodes positive. If she overheats she gets a dry cough and chokes, IBS, prior brain tumor, ADD    Patient Stated Goals address lymphedema to get normal life back.    Currently in Pain? No/denies    Pain Score 0-No pain    Multiple Pain Sites No                               [facility] Adult PT Treatment/Exercise - 02/24/21 0001       Manual Therapy   Manual Therapy Manual Lymphatic Drainage (MLD)    Manual Lymphatic Drainage (MLD) In Supine: Short neck, superficial and deep abdominals, Lt inguinal nodes, Lt  axillo-inguinal anastomosis, then Lt UE working proximal from distal then retracing all steps, then did the same on Rt side                          PT Long Term Goals - 02/04/21 1104       PT LONG TERM GOAL #1   Title Pt will be independent in self MLD    Time 4    Period Weeks    Status New    Target Date 03/04/21      PT LONG TERM GOAL #2   Title Pt will note improvement in trunk/UE swelling by 50%    Time 4    Period Weeks    Status New    Target Date 03/04/21      PT LONG TERM GOAL #3   Title Pt will be educated in a HEP for UE strengthening/    Time 4    Period Weeks    Status New    Target Date 03/03/21                   Plan - 02/24/21 1452     Clinical Impression Statement Continued MLD to bilateral UE's.  Pt also heard from [employer] and was advised to wait until she turns 65 to see if it will be covered better.  Will email Montine Circle because she wants to know how her  present benefits will pay for Flexi touch.  Felt good after session today.    Comorbidities Bil masectomy with 2 lymph node removal on the R    Stability/Clinical Decision Making Stable/Uncomplicated    Rehab Potential Good    PT Frequency 2x / week    PT Duration 4 weeks    PT Treatment/Interventions ADLs/Self Care Home Management;Therapeutic exercise;Manual techniques;Patient/family education;Manual lymph drainage    PT Next Visit Plan  MLD to UE's/trunk and review with pt, check sleeve fit and see if they need to be replaced yet    Consulted and Agree with Plan of Care Patient             Patient will benefit from skilled therapeutic intervention in order to improve the following deficits and impairments:  Decreased knowledge of precautions, Increased edema, Postural dysfunction  Visit Diagnosis: Ductal carcinoma in situ (DCIS) of right breast  Lymphedema, not elsewhere classified  Localized edema  Stiffness of left shoulder, not elsewhere classified  Acute pain of left shoulder  Stiffness of right shoulder, not elsewhere classified     Problem List Patient Active Problem List   Diagnosis Date Noted   Genetic testing 08/03/2019   Breast cancer, right ([place]) 07/25/2019   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   Family history of ovarian cancer    Family history of breast cancer    Family history of colon cancer    Family history of kidney cancer    Family history of melanoma    Vitamin D deficiency 12/12/2018   Essential hypertension, benign 10/15/2015   Kidney stone 05/02/2015   Palpitations 02/08/2015   Atypical chest pain 02/08/2015   Exertional dyspnea 02/08/2015   Lower extremity edema 02/08/2015   Mixed dyslipidemia 03/26/2014   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/03/2013   Depression with anxiety 04/26/2012   ADHD (attention deficit hyperactivity disorder) 04/26/2012    Claris Pong, PT 02/24/2021, 2:56 PM  [facility] @ [place], [place], [address] Phone: [phone]   Fax:  [phone]  Name: Catherine Cole MRN: [id number] Date of Birth: [birth year]

## 2021-02-26 ENCOUNTER — Ambulatory Visit: Payer: BC Managed Care – PPO

## 2021-02-26 ENCOUNTER — Other Ambulatory Visit: Payer: Self-pay

## 2021-02-26 DIAGNOSIS — D0511 Intraductal carcinoma in situ of right breast: Secondary | ICD-10-CM

## 2021-02-26 DIAGNOSIS — R600 Localized edema: Secondary | ICD-10-CM

## 2021-02-26 DIAGNOSIS — I890 Lymphedema, not elsewhere classified: Secondary | ICD-10-CM

## 2021-02-26 NOTE — Patient Instructions (Signed)
Pt was given written instructions for left arm self MLD working to left inguinal area only.

## 2021-02-26 NOTE — Therapy (Signed)
[facility] @ [place], [place], [address] Phone: [phone]   Fax:  [phone]  Physical Therapy Treatment  Patient Details  Name: Catherine Cole MRN: [id number] Date of Birth: 12/30/1955 Referring Provider (PT): Dr. Brantley Stage   Encounter Date: 02/26/2021   PT End of Session - 02/26/21 1704     Visit Number 4    Number of Visits 9    Date for PT Re-Evaluation 03/04/21    PT Start Time 5501    PT Stop Time 1657    PT Time Calculation (min) 53 min    Activity Tolerance Patient tolerated treatment well    Behavior During Therapy [facility] for tasks assessed/performed             Past Medical History:  Diagnosis Date   Adult ADHD    Anemia    Anxiety    Cancer ([place])    Phreesia 58/68/2574   Complication of anesthesia    Depression    Depression    Phreesia 05/13/2020   Family history of breast cancer    Family history of colon cancer    Family history of kidney cancer    Family history of melanoma    Family history of ovarian cancer    Hypertension    IBS (irritable bowel syndrome)    PONV (postoperative nausea and vomiting)     Past Surgical History:  Procedure Laterality Date   ABDOMINAL HYSTERECTOMY N/A    Phreesia 05/13/2020   APPENDECTOMY     12 yrs   BREAST SURGERY N/A    Phreesia 05/13/2020   CHOLECYSTECTOMY  1995   CHOLESTEATOMA EXCISION     13 and 22 yrs   [place] N/A 12/03/2015   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;  Surgeon: [name], DO;  Location: [facility];  Service: Plastics;  Laterality: N/A;   MASTECTOMY W/ SENTINEL NODE BIOPSY Bilateral 07/25/2019   Procedure: BILATERAL MASTECTOMY WITH RIGHT SENTINEL LYMPH NODE BIOPSY, LEFT RISK REDUCING MASTECTOMY;  Surgeon: [name], MD;  Location: [facility];  Service: General;  Laterality:  Bilateral;  PEC BLOCK   TONSILLECTOMY     15 yrs    There were no vitals filed for this visit.   Subjective Assessment - 02/26/21 1603     Subjective I did well after last visit and I have a little massager at home that stimulates the lymphatics. Derrick called me yesterday and I missed his call. I forgot to call him back.   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 0/2 lymph nodes positive. If she overheats she gets a dry cough and chokes, IBS, prior brain tumor, ADD    Currently in Pain? No/denies    Pain Score 0-No pain                               [facility] Adult PT Treatment/Exercise - 02/26/21 0001       Manual Therapy   Manual Therapy Manual Lymphatic Drainage (MLD)    Manual Lymphatic Drainage (MLD) In Supine: PT peformed Short neck, superficial and deep abdominals, Lt inguinal nodes, Lt axillo-inguinal anastomosis, then Lt UE working proximal to distal then retracing all steps, and ending with LN's, then did the same on  Rt side.  Pt was then instructed in self MLD to the left side including sequence, stretch, pressure and practicing all steps.                    PT Education - 02/26/21 1704     Education Details Self left UE MLD    Person(s) Educated Patient    Methods Explanation;Demonstration;Tactile cues;Verbal cues;Handout    Comprehension Returned demonstration;Verbal cues required;Tactile cues required;Need further instruction;Other (comment)   Pt did exceptionally well overall                PT Long Term Goals - 02/04/21 1104       PT LONG TERM GOAL #1   Title Pt will be independent in self MLD    Time 4    Period Weeks    Status New    Target Date 03/04/21      PT LONG TERM GOAL #2   Title Pt will note improvement in trunk/UE swelling by 50%    Time 4    Period Weeks    Status New    Target Date 03/04/21      PT LONG TERM GOAL #3   Title Pt will be educated  in a HEP for UE strengthening/    Time 4    Period Weeks    Status New    Target Date 03/03/21                   Plan - 02/26/21 1705     Clinical Impression Statement Pt thinks her sleeves are still good as she bought new ones a few months ago.  She had a call from [facility] but was not able to speak with him, so encouraged her to call him back.  Therapist performed MLD to bilateral UE's and then instructed pt in self MLD to the left arm and gave her a handout.  She required occasional VC's and tactile cues but did exceptionally well throughout.  She found it easier to perform than in the past.    Personal Factors and Comorbidities Comorbidity 1    Comorbidities Bil masectomy with 2 lymph node removal on the R    Stability/Clinical Decision Making Stable/Uncomplicated    Rehab Potential Good    PT Frequency 2x / week    PT Duration 4 weeks    PT Treatment/Interventions ADLs/Self Care Home Management;Therapeutic exercise;Manual techniques;Patient/family education;Manual lymph drainage;Vasopneumatic Device    PT Next Visit Plan MLD to UE's/trunk and review with pt, Flexi touch demo set up yet?    Consulted and Agree with Plan of Care Patient             Patient will benefit from skilled therapeutic intervention in order to improve the following deficits and impairments:  Decreased knowledge of precautions, Increased edema, Postural dysfunction  Visit Diagnosis: Ductal carcinoma in situ (DCIS) of right breast  Localized edema  Lymphedema, not elsewhere classified     Problem List Patient Active Problem List   Diagnosis Date Noted   Genetic testing 08/03/2019   Breast cancer, right ([place]) 07/25/2019   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   Family history of ovarian cancer    Family history of breast cancer    Family history of colon cancer    Family history of kidney cancer    Family history of melanoma    Vitamin D deficiency 12/12/2018    Essential hypertension, benign 10/15/2015  Kidney stone 05/02/2015   Palpitations 02/08/2015   Atypical chest pain 02/08/2015   Exertional dyspnea 02/08/2015   Lower extremity edema 02/08/2015   Mixed dyslipidemia 03/26/2014   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/03/2013   Depression with anxiety 04/26/2012   ADHD (attention deficit hyperactivity disorder) 04/26/2012    Claris Pong, PT 02/26/2021, 5:09 PM  [facility] @ [place], [place], [address] Phone: [phone]   Fax:  [phone]  Name: Shenaya Lebo MRN: [id number] Date of Birth: 08/17/1955

## 2021-03-02 ENCOUNTER — Other Ambulatory Visit: Payer: Self-pay | Admitting: Surgery

## 2021-03-03 ENCOUNTER — Ambulatory Visit: Payer: BC Managed Care – PPO

## 2021-03-04 ENCOUNTER — Encounter: Payer: Self-pay | Admitting: Surgery

## 2021-03-05 ENCOUNTER — Other Ambulatory Visit: Payer: Self-pay

## 2021-03-05 ENCOUNTER — Ambulatory Visit: Payer: BC Managed Care – PPO

## 2021-03-05 DIAGNOSIS — I890 Lymphedema, not elsewhere classified: Secondary | ICD-10-CM

## 2021-03-05 DIAGNOSIS — D0511 Intraductal carcinoma in situ of right breast: Secondary | ICD-10-CM | POA: Diagnosis not present

## 2021-03-05 DIAGNOSIS — R600 Localized edema: Secondary | ICD-10-CM

## 2021-03-05 NOTE — Therapy (Signed)
[facility] @ [place], [place], [address] Phone: [phone]   Fax:  [phone]  Physical Therapy Treatment  Patient Details  Name: Catherine Cole MRN: [id number] Date of Birth: 1955/08/01 Referring Provider (PT): Dr. Brantley Stage   Encounter Date: 03/05/2021   PT End of Session - 03/05/21 1658     Visit Number 5    Number of Visits 13    Date for PT Re-Evaluation 04/01/21    PT Start Time 0947    PT Stop Time 1656    PT Time Calculation (min) 50 min    Activity Tolerance Patient tolerated treatment well    Behavior During Therapy [facility] for tasks assessed/performed             Past Medical History:  Diagnosis Date   Adult ADHD    Anemia    Anxiety    Cancer ([place])    Phreesia 09/62/8366   Complication of anesthesia    Depression    Depression    Phreesia 05/13/2020   Family history of breast cancer    Family history of colon cancer    Family history of kidney cancer    Family history of melanoma    Family history of ovarian cancer    Hypertension    IBS (irritable bowel syndrome)    PONV (postoperative nausea and vomiting)     Past Surgical History:  Procedure Laterality Date   ABDOMINAL HYSTERECTOMY N/A    Phreesia 05/13/2020   APPENDECTOMY     12 yrs   BREAST SURGERY N/A    Phreesia 05/13/2020   CHOLECYSTECTOMY  1995   CHOLESTEATOMA EXCISION     13 and 22 yrs   [place] N/A 12/03/2015   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;  Surgeon: [name], DO;  Location: [facility];  Service: Plastics;  Laterality: N/A;   MASTECTOMY W/ SENTINEL NODE BIOPSY Bilateral 07/25/2019   Procedure: BILATERAL MASTECTOMY WITH RIGHT SENTINEL LYMPH NODE BIOPSY, LEFT RISK REDUCING MASTECTOMY;  Surgeon: [name], MD;  Location: [facility];  Service: General;  Laterality:  Bilateral;  PEC BLOCK   TONSILLECTOMY     15 yrs    There were no vitals filed for this visit.   Subjective Assessment - 03/05/21 1605     Subjective I spoke with Montine Circle and Jinny Blossom is going to come next Tuesday to do the  Flexi touch trial at my house.    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 0/2 lymph nodes positive. If she overheats she gets a dry cough and chokes, IBS, prior brain tumor, ADD    Patient Stated Goals address lymphedema to get normal life back.    Currently in Pain? No/denies    Pain Score 0-No pain    Multiple Pain Sites No                                             PT Long Term Goals - 03/05/21 1703       PT LONG TERM GOAL #1   Title Pt will be independent in self MLD    Baseline instructed but not yet independent    Time 4  Period Weeks    Status On-going    Target Date 04/02/21      PT LONG TERM GOAL #2   Title Pt will note improvement in trunk/UE swelling by 50%    Baseline improving    Time 4    Period Weeks    Status On-going    Target Date 04/02/21      PT LONG TERM GOAL #3   Title Pt will be educated in a HEP for UE strengthening/    Time 4    Period Weeks    Status ongoing    Target Date 04/02/21                   Plan - 03/05/21 1700     Clinical Impression Statement Pt will have her Flexi touch trial next Tuesday.  We continued MLD to bilateral UE's and reviewed proper direction and pressure with pt. Pt requires ocasional VC's and TCs but has been practicing at home.  Will review again next visit    Personal Factors and Comorbidities Comorbidity 1    Comorbidities Bil masectomy with 2 lymph node removal on the R    Stability/Clinical Decision Making Stable/Uncomplicated    Rehab Potential Good    PT Frequency 2x / week    PT Duration 4 weeks    PT Treatment/Interventions ADLs/Self Care Home Management;Therapeutic  exercise;Manual techniques;Patient/family education;Manual lymph drainage;Vasopneumatic Device    PT Next Visit Plan MLD to UE's/trunk and review with pt, Flexi touch demo set up yet to be Tues. 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   Family history of ovarian cancer    Family history of breast cancer    Family history of colon cancer    Family history of kidney cancer    Family history of melanoma    Vitamin D deficiency 12/12/2018   Essential hypertension, benign 10/15/2015   Kidney stone 05/02/2015   Palpitations 02/08/2015   Atypical chest pain 02/08/2015   Exertional dyspnea 02/08/2015   Lower extremity edema 02/08/2015   Mixed dyslipidemia 03/26/2014   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/03/2013   Depression with anxiety 04/26/2012   ADHD (attention deficit hyperactivity disorder) 04/26/2012    Claris Pong, PT 03/05/2021, 5:04 PM  [facility] @ [place], [place], [address] Phone: [phone]   Fax:  [phone]  Name: Catherine Cole MRN: [id number] Date of Birth: 1955-06-12

## 2021-03-09 ENCOUNTER — Other Ambulatory Visit: Payer: Self-pay

## 2021-03-09 ENCOUNTER — Ambulatory Visit: Payer: BC Managed Care – PPO

## 2021-03-09 DIAGNOSIS — M25612 Stiffness of left shoulder, not elsewhere classified: Secondary | ICD-10-CM

## 2021-03-09 DIAGNOSIS — I890 Lymphedema, not elsewhere classified: Secondary | ICD-10-CM

## 2021-03-09 DIAGNOSIS — R600 Localized edema: Secondary | ICD-10-CM

## 2021-03-09 DIAGNOSIS — D0511 Intraductal carcinoma in situ of right breast: Secondary | ICD-10-CM

## 2021-03-09 DIAGNOSIS — M25611 Stiffness of right shoulder, not elsewhere classified: Secondary | ICD-10-CM

## 2021-03-09 DIAGNOSIS — M25512 Pain in left shoulder: Secondary | ICD-10-CM

## 2021-03-09 DIAGNOSIS — M25511 Pain in right shoulder: Secondary | ICD-10-CM

## 2021-03-09 NOTE — Therapy (Signed)
[facility] @ [place], [place], [address] Phone: [phone]   Fax:  [phone]  Physical Therapy Treatment  Patient Details  Name: Catherine Cole MRN: [id number] Date of Birth: 09/24/1955 Referring Provider (PT): Dr. Brantley Stage   Encounter Date: 03/09/2021   PT End of Session - 03/09/21 1449     Visit Number 6    Number of Visits 13    Date for PT Re-Evaluation 04/01/21    PT Start Time 1414   pt late   PT Stop Time 1455    PT Time Calculation (min) 41 min    Activity Tolerance Patient tolerated treatment well    Behavior During Therapy [facility] for tasks assessed/performed             Past Medical History:  Diagnosis Date   Adult ADHD    Anemia    Anxiety    Cancer ([place])    Phreesia 90/30/0923   Complication of anesthesia    Depression    Depression    Phreesia 05/13/2020   Family history of breast cancer    Family history of colon cancer    Family history of kidney cancer    Family history of melanoma    Family history of ovarian cancer    Hypertension    IBS (irritable bowel syndrome)    PONV (postoperative nausea and vomiting)     Past Surgical History:  Procedure Laterality Date   ABDOMINAL HYSTERECTOMY N/A    Phreesia 05/13/2020   APPENDECTOMY     12 yrs   BREAST SURGERY N/A    Phreesia 05/13/2020   CHOLECYSTECTOMY  1995   CHOLESTEATOMA EXCISION     13 and 22 yrs   [place] N/A 12/03/2015   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;  Surgeon: [name], DO;  Location: [facility];  Service: Plastics;  Laterality: N/A;   MASTECTOMY W/ SENTINEL NODE BIOPSY Bilateral 07/25/2019   Procedure: BILATERAL MASTECTOMY WITH RIGHT SENTINEL LYMPH NODE BIOPSY, LEFT RISK REDUCING MASTECTOMY;  Surgeon: [name], MD;  Location: [facility];  Service: General;   Laterality: Bilateral;  PEC BLOCK   TONSILLECTOMY     15 yrs    There were no vitals filed for this visit.   Subjective Assessment - 03/09/21 1415     Subjective I have my Flexi touch trial tomorrow. Had my birthday yesterday and saw my mom and sister. I tried a little of the MLD at home. 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 0/2 lymph nodes positive. If she overheats she gets a dry cough and chokes, IBS, prior brain tumor, ADD    Patient Stated Goals address lymphedema to get normal life back.    Currently in Pain? No/denies    Pain Score 0-No pain                               [facility] Adult PT Treatment/Exercise - 03/09/21 0001       Manual Therapy   Manual Therapy Manual Lymphatic Drainage (MLD)    Manual Lymphatic Drainage (MLD) In Supine: PT peformed Short neck, superficial and deep abdominals, Lt inguinal nodes, Lt axillo-inguinal anastomosis, then Lt UE working proximal from distal  then retracing all steps, then pt. did the same on Rt side.  Pt was then instructed in self MLD to the right side including sequence, stretch, pressure.                          PT Long Term Goals - 03/05/21 1703       PT LONG TERM GOAL #1   Title Pt will be independent in self MLD    Baseline instructed but not yet independent    Time 4    Period Weeks    Status On-going    Target Date 04/02/21      PT LONG TERM GOAL #2   Title Pt will note improvement in trunk/UE swelling by 50%    Baseline improving    Time 4    Period Weeks    Status On-going    Target Date 04/02/21      PT LONG TERM GOAL #3   Title Pt will be educated in a HEP for UE strengthening/    Time 4    Period Weeks    Status New    Target Date 04/02/21                   Plan - 03/09/21 1456     Clinical Impression Statement Pt was 13 min late for appt.  Continued MLD to bilateral  upper  extremities with therapist performing MLD to the left and pt performing MLD to the right UE.  She still requires intermitent VC's and TC's.Pt felt much better after finishing the MLD. She has Pump trial tomorrow.    Personal Factors and Comorbidities Comorbidity 1    Comorbidities Bil masectomy with 2 lymph node removal on the R    Stability/Clinical Decision Making Stable/Uncomplicated    Rehab Potential Good    PT Frequency 2x / week    PT Duration 4 weeks    PT Treatment/Interventions ADLs/Self Care Home Management;Therapeutic exercise;Manual techniques;Patient/family education;Manual lymph drainage;Vasopneumatic Device    PT Next Visit Plan MLD to UE's/trunk and review with pt, Flexi touch demo set up yet to be Tues. Nov 22    PT Home Exercise Plan self MLD    Consulted and Agree with Plan of Care Patient             Patient will benefit from skilled therapeutic intervention in order to improve the following deficits and impairments:  Decreased knowledge of precautions, Increased edema, Postural dysfunction  Visit Diagnosis: Ductal carcinoma in situ (DCIS) of right breast  Localized edema  Lymphedema, not elsewhere classified  Stiffness of left shoulder, not elsewhere classified  Acute pain of left shoulder  Stiffness of right shoulder, not elsewhere classified  Acute pain of right shoulder     Problem List Patient Active Problem List   Diagnosis Date Noted   Genetic testing 08/03/2019   Breast cancer, right ([place]) 07/25/2019   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   Family history of ovarian cancer    Family history of breast cancer    Family history of colon cancer    Family history of kidney cancer    Family history of melanoma    Vitamin D deficiency 12/12/2018   Essential hypertension, benign 10/15/2015   Kidney stone 05/02/2015   Palpitations 02/08/2015   Atypical chest pain 02/08/2015   Exertional dyspnea 02/08/2015   Lower  extremity edema 02/08/2015   Mixed dyslipidemia 03/26/2014  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/03/2013   Depression with anxiety 04/26/2012   ADHD (attention deficit hyperactivity disorder) 04/26/2012    Claris Pong, PT 03/09/2021, 3:08 PM  [facility] @ [place], [place], [address] Phone: [phone]   Fax:  [phone]  Name: Catherine Cole MRN: [id number] Date of Birth: 1955-07-07

## 2021-03-13 ENCOUNTER — Other Ambulatory Visit: Payer: Self-pay | Admitting: Family Medicine

## 2021-03-13 DIAGNOSIS — F900 Attention-deficit hyperactivity disorder, predominantly inattentive type: Secondary | ICD-10-CM

## 2021-03-13 MED ORDER — LISDEXAMFETAMINE DIMESYLATE 50 MG PO CAPS: 50.0000 mg | ORAL_CAPSULE | Freq: Every day | ORAL | 0 refills | Status: DC

## 2021-03-13 NOTE — Telephone Encounter (Signed)
Controlled substance database (PDMP) reviewed. No concerns appreciated. Last filled 10/27. Rx sent.

## 2021-03-18 ENCOUNTER — Ambulatory Visit: Payer: BC Managed Care – PPO

## 2021-03-18 ENCOUNTER — Other Ambulatory Visit: Payer: Self-pay

## 2021-03-18 DIAGNOSIS — I890 Lymphedema, not elsewhere classified: Secondary | ICD-10-CM

## 2021-03-18 DIAGNOSIS — M25612 Stiffness of left shoulder, not elsewhere classified: Secondary | ICD-10-CM

## 2021-03-18 DIAGNOSIS — M25611 Stiffness of right shoulder, not elsewhere classified: Secondary | ICD-10-CM

## 2021-03-18 DIAGNOSIS — D0511 Intraductal carcinoma in situ of right breast: Secondary | ICD-10-CM | POA: Diagnosis not present

## 2021-03-18 DIAGNOSIS — M25511 Pain in right shoulder: Secondary | ICD-10-CM

## 2021-03-18 DIAGNOSIS — M25512 Pain in left shoulder: Secondary | ICD-10-CM

## 2021-03-18 DIAGNOSIS — R600 Localized edema: Secondary | ICD-10-CM

## 2021-03-18 NOTE — Therapy (Addendum)
[facility] @ [place], [place], [address] Phone: [phone]   Fax:  [phone]  Physical Therapy Treatment  Patient Details  Name: Catherine Cole MRN: [id number] Date of Birth: 06/25/1955 Referring Provider (PT): Dr. Brantley Stage   Encounter Date: 03/18/2021   PT End of Session - 03/18/21 0814     Visit Number 7    Number of Visits 13    Date for PT Re-Evaluation 04/01/21    PT Start Time 0809   pt late   PT Stop Time 0852    PT Time Calculation (min) 43 min    Activity Tolerance Patient tolerated treatment well    Behavior During Therapy [facility] for tasks assessed/performed             Past Medical History:  Diagnosis Date   Adult ADHD    Anemia    Anxiety    Cancer ([place])    Phreesia 25/36/6440   Complication of anesthesia    Depression    Depression    Phreesia 05/13/2020   Family history of breast cancer    Family history of colon cancer    Family history of kidney cancer    Family history of melanoma    Family history of ovarian cancer    Hypertension    IBS (irritable bowel syndrome)    PONV (postoperative nausea and vomiting)     Past Surgical History:  Procedure Laterality Date   ABDOMINAL HYSTERECTOMY N/A    Phreesia 05/13/2020   APPENDECTOMY     12 yrs   BREAST SURGERY N/A    Phreesia 05/13/2020   CHOLECYSTECTOMY  1995   CHOLESTEATOMA EXCISION     13 and 22 yrs   [place] N/A 12/03/2015   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;  Surgeon: [name], DO;  Location: [facility];  Service: Plastics;  Laterality: N/A;   MASTECTOMY W/ SENTINEL NODE BIOPSY Bilateral 07/25/2019   Procedure: BILATERAL MASTECTOMY WITH RIGHT SENTINEL LYMPH NODE BIOPSY, LEFT RISK REDUCING MASTECTOMY;  Surgeon: [name], MD;  Location: [facility];  Service: General;   Laterality: Bilateral;  PEC BLOCK   TONSILLECTOMY     15 yrs    There were no vitals filed for this visit.   Subjective Assessment - 03/18/21 0809     Subjective I didn't get to do the flexi touch trial because a friend had an emergency.  We rescheduled for next week.  I have done some MLD at home and think I did pretty well.    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 0/2 lymph nodes positive. If she overheats she gets a dry cough and chokes, IBS, prior brain tumor, ADD                   LYMPHEDEMA/ONCOLOGY QUESTIONNAIRE - 03/18/21 0001       Right Upper Extremity Lymphedema   15 cm Proximal to Olecranon Process 32.9 cm    10 cm Proximal to Olecranon Process 31 cm    Olecranon Process 27.5 cm    15 cm Proximal to Ulnar Styloid Process 26.2 cm    10 cm Proximal to Ulnar Styloid Process 23.8 cm    Just Proximal to Ulnar Styloid Process 17 cm    Across Hand at [employer]  Space 20.8 cm    At [place] of 2nd Digit 6.3 cm      Left Upper Extremity Lymphedema   15 cm Proximal to Olecranon Process 34 cm    10 cm Proximal to Olecranon Process 31.8 cm    Olecranon Process 27.7 cm    15 cm Proximal to Ulnar Styloid Process 25.8 cm    10 cm Proximal to Ulnar Styloid Process 22 cm    Just Proximal to Ulnar Styloid Process 17 cm    Across Hand at [employer] 20 cm    At [place] of 2nd Digit 6.3 cm            Treatment:MLD performed bilaterally:Short neck, 5 diaphragmatic breaths, right axillary and inguinal LN's, right axillo-inguinal pathway, Right UE starting proximally and working distally and retracing all steps and ending with LN's.  Repeated same on Left/                          PT Long Term Goals - 03/05/21 1703       PT LONG TERM GOAL #1   Title Pt will be independent in self MLD    Baseline instructed but not yet independent    Time 4    Period Weeks    Status On-going    Target  Date 04/02/21      PT LONG TERM GOAL #2   Title Pt will note improvement in trunk/UE swelling by 50%    Baseline improving    Time 4    Period Weeks    Status On-going    Target Date 04/02/21      PT LONG TERM GOAL #3   Title Pt will be educated in a HEP for UE strengthening/    Time 4    Period Weeks    Status New    Target Date 04/02/21                   Plan - 03/18/21 0852     Clinical Impression Statement Pt did not haave her flexi touch trial but rescheduled it for next week. Pt was remeasured with increases noted mainly in the right UE.  She has been advised to try wearing her sleeves more when she is being active.  She is compliant with MLD.    Personal Factors and Comorbidities Comorbidity 1    Comorbidities Bil masectomy with 2 lymph node removal on the R    Stability/Clinical Decision Making Stable/Uncomplicated    Rehab Potential Good    PT Frequency 2x / week    PT Duration 4 weeks    PT Treatment/Interventions ADLs/Self Care Home Management;Therapeutic exercise;Manual techniques;Patient/family education;Manual lymph drainage;Vasopneumatic Device    PT Next Visit Plan MLD to UE's/trunk and review with pt, Flexi touch demo set up yet to be Tues. Nov 22    PT Home Exercise Plan self MLD    Consulted and Agree with Plan of Care Patient             Patient will benefit from skilled therapeutic intervention in order to improve the following deficits and impairments:  Decreased knowledge of precautions, Increased edema, Postural dysfunction  Visit Diagnosis: Ductal carcinoma in situ (DCIS) of right breast  Localized edema  Lymphedema, not elsewhere classified  Stiffness of left shoulder, not elsewhere classified  Acute pain of right shoulder  Stiffness of right shoulder, not elsewhere classified  Acute pain of left shoulder  Problem List Patient Active Problem List   Diagnosis Date Noted   Genetic testing 08/03/2019   Breast cancer,  right ([place]) 07/25/2019   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   Family history of ovarian cancer    Family history of breast cancer    Family history of colon cancer    Family history of kidney cancer    Family history of melanoma    Vitamin D deficiency 12/12/2018   Essential hypertension, benign 10/15/2015   Kidney stone 05/02/2015   Palpitations 02/08/2015   Atypical chest pain 02/08/2015   Exertional dyspnea 02/08/2015   Lower extremity edema 02/08/2015   Mixed dyslipidemia 03/26/2014   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/03/2013   Depression with anxiety 04/26/2012   ADHD (attention deficit hyperactivity disorder) 04/26/2012    Claris Pong, PT 03/18/2021, 8:57 AM  [facility] @ [place], [place], [address] Phone: [phone]   Fax:  [phone]  Name: Catherine Cole MRN: [id number] Date of Birth: October 27, 1955

## 2021-03-19 ENCOUNTER — Encounter: Payer: Self-pay | Admitting: Family Medicine

## 2021-03-19 ENCOUNTER — Ambulatory Visit: Payer: BC Managed Care – PPO | Admitting: Family Medicine

## 2021-03-19 VITALS — BP 132/80 | HR 85 | Temp 98.2°F | Resp 17 | Ht 66.0 in | Wt 191.2 lb

## 2021-03-19 DIAGNOSIS — F418 Other specified anxiety disorders: Secondary | ICD-10-CM

## 2021-03-19 DIAGNOSIS — R058 Other specified cough: Secondary | ICD-10-CM

## 2021-03-19 DIAGNOSIS — F5104 Psychophysiologic insomnia: Secondary | ICD-10-CM

## 2021-03-19 MED ORDER — BUSPIRONE HCL 10 MG PO TABS: 5.0000 mg | ORAL_TABLET | Freq: Two times a day (BID) | ORAL | 0 refills | Status: DC

## 2021-03-19 NOTE — Progress Notes (Signed)
Subjective:  Patient ID: Catherine Cole, female    DOB: [birth year]  Age: 65 y.o. MRN: [id number]  CC:  Chief Complaint  Patient presents with   Anxiety    Pt here for 6 weeks recheck, has been doing well, has been doing PT, change in bupropion has done okay anxiety control better but could be greater     HPI Catherine Cole presents for   Follow-up from 01/29/2021 visit  Cough Suspected upper airway cough syndrome.  Trigger avoidance was discussed last visit, started on PPI daily.  Taper to lowest effective dose/infrequent dosing if needed. Better with daily PPI. No further cough. Working well.   Depression with anxiety Impacting focus last visit, situational stressors.  Increased the 10 mg buspirone to 1 in the morning, one half at night.  Continued on Celexa same dose.  Feels better. Not needing 1/2 dose on some days if she feels sluggish. Felt less alert with 2 per day in past. Skipping evening dose 1-2 per week or less. Would prefer no med changes at this time.  Trying to get out more, more activity.  Trouble getting to sleep - thinking about things going on in life.   Depression screen [facility] 2/9 03/19/2021 01/29/2021 01/16/2021 10/16/2020 04/02/2020  Decreased Interest 1 1 0 2 0  Down, Depressed, Hopeless 1 2 0 3 0  PHQ - 2 Score 2 3 0 5 0  Altered sleeping 1 1 - 0 1  Tired, decreased energy 1 1 - 0 2  Change in appetite 1 0 - 0 0  Feeling bad or failure about yourself  0 0 - 0 0  Trouble concentrating 1 2 - 0 0  Moving slowly or fidgety/restless 0 0 - 0 0  Suicidal thoughts 0 0 - 0 0  PHQ-9 Score 6 7 - 5 3  Difficult doing work/chores Somewhat difficult Not difficult at all - Somewhat difficult -  Some recent data might be hidden     GAD 7 : Generalized Anxiety Score 01/29/2021 04/02/2020 12/12/2018 09/29/2018  Nervous, Anxious, on Edge 1 1 1 3   Control/stop worrying 1 0 0 3  Worry too much - different things 1 0 0 3  Trouble relaxing 1 0 0 3  Restless 0  0 0 0  Easily annoyed or irritable 0 0 0 1  Afraid - awful might happen 0 0 0 0  Total GAD 7 Score 4 1 1 13   Anxiety Difficulty Not difficult at all - Not difficult at all -      History Patient Active Problem List   Diagnosis Date Noted   Genetic testing 08/03/2019   Breast cancer, right ([place]) 07/25/2019   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   Family history of ovarian cancer    Family history of breast cancer    Family history of colon cancer    Family history of kidney cancer    Family history of melanoma    Vitamin D deficiency 12/12/2018   Essential hypertension, benign 10/15/2015   Kidney stone 05/02/2015   Palpitations 02/08/2015   Atypical chest pain 02/08/2015   Exertional dyspnea 02/08/2015   Lower extremity edema 02/08/2015   Mixed dyslipidemia 03/26/2014   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/03/2013   Depression with anxiety 04/26/2012   ADHD (attention deficit hyperactivity disorder) 04/26/2012   Past Medical History:  Diagnosis Date   Adult ADHD    Anemia    Anxiety    Cancer (  [place])    Phreesia 87/68/1157   Complication of anesthesia    Depression    Depression    Phreesia 05/13/2020   Family history of breast cancer    Family history of colon cancer    Family history of kidney cancer    Family history of melanoma    Family history of ovarian cancer    Hypertension    IBS (irritable bowel syndrome)    PONV (postoperative nausea and vomiting)    Past Surgical History:  Procedure Laterality Date   ABDOMINAL HYSTERECTOMY N/A    Phreesia 05/13/2020   APPENDECTOMY     12 yrs   BREAST SURGERY N/A    Phreesia 05/13/2020   CHOLECYSTECTOMY  1995   CHOLESTEATOMA EXCISION     13 and 22 yrs   FRACTURE SURGERY     KIDNEY STONE SURGERY  1995   LESION DESTRUCTION N/A 12/03/2015   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;  Surgeon: [name], DO;  Location: [facility];  Service:  Plastics;  Laterality: N/A;   MASTECTOMY W/ SENTINEL NODE BIOPSY Bilateral 07/25/2019   Procedure: BILATERAL MASTECTOMY WITH RIGHT SENTINEL LYMPH NODE BIOPSY, LEFT RISK REDUCING MASTECTOMY;  Surgeon: [name], MD;  Location: [facility];  Service: General;  Laterality: Bilateral;  PEC BLOCK   TONSILLECTOMY     15 yrs   Allergies  Allergen Reactions   Latex Hives   Demerol Nausea And Vomiting   Codeine Nausea And Vomiting   Erythromycin Itching and Rash   Neosporin [Neomycin-Polymyxin B Gu] Itching and Rash   Prior to Admission medications   Medication Sig Start Date End Date Taking? Authorizing Provider  busPIRone (BUSPAR) 10 MG tablet TAKE 1 TABLET BY MOUTH 2 TIMES DAILY AS NEEDED. 09/03/20  Yes [name], MD  chlorthalidone (HYGROTON) 25 MG tablet TAKE 1 TABLET (25 MG TOTAL) BY MOUTH DAILY. 12/30/20  Yes [name], MD  citalopram (CELEXA) 40 MG tablet TAKE 1 TABLET BY MOUTH EVERY DAY 02/02/21  Yes [name], MD  Incontinence Supply Disposable (DEPEND EASY FIT UNDERGARMENTS) MISC Use daily as directed. Change several times daily as needed. 06/29/18  Yes [name], [name], MD  lisdexamfetamine (VYVANSE) 50 MG capsule Take 1 capsule (50 mg total) by mouth daily. Ok to fill in 60 days 12/12/20  Yes [name], MD  lisdexamfetamine (VYVANSE) 50 MG capsule Take 1 capsule (50 mg total) by mouth daily. 03/13/21  Yes [name], MD  losartan (COZAAR) 50 MG tablet TAKE 1 TABLET BY MOUTH EVERY DAY 12/24/20  Yes [name], MD  Multiple Vitamin (MULTIVITAMIN WITH MINERALS) TABS tablet Take 1 tablet by mouth daily.   Yes [provider]  potassium chloride (KLOR-CON) 10 MEQ tablet Take 2 tablets (20 mEq total) by mouth daily. 12/16/20  Yes [name], MD  valACYclovir (VALTREX) 500 MG tablet Take 2 tablets by mouth at acute onset for outbreak, followed by 1 tablet once a day x 5 days 01/11/20  Yes [name], [name], MD   Vitamin D, Ergocalciferol, (DRISDOL) 1.25 MG (50000 UNIT) CAPS capsule Take 1 capsule (50,000 Units total) by mouth every 7 (seven) days. 01/16/21  Yes [name], MD  lisdexamfetamine (VYVANSE) 50 MG capsule Take 1 capsule (50 mg total) by mouth daily before breakfast. 12/12/20 01/11/21  [name], MD   Social History   Socioeconomic History   Marital status: Widowed  Spouse name: Not on file   Number of children: Not on file   Years of education: Not on file   Highest education level: Not on file  Occupational History   Not on file  Tobacco Use   Smoking status: Former    Packs/day: 1.00    Years: 7.00    Pack years: 7.00    Types: Cigarettes   Smokeless tobacco: Never   Tobacco comments:    some day smoker when she smoked  Substance and Sexual Activity   Alcohol use: No   Drug use: No   Sexual activity: Never  Other Topics Concern   Not on file  Social History Narrative   Not on file   Social Determinants of Health   Financial Resource Strain: Not on file  Food Insecurity: Not on file  Transportation Needs: Not on file  Physical Activity: Not on file  Stress: Not on file  Social Connections: Not on file  Intimate Partner Violence: Not on file    Review of Systems  Per HPI.  Objective:   Vitals:   03/19/21 1533  BP: 132/80  Pulse: 85  Resp: 17  Temp: 98.2 F (36.8 C)  TempSrc: Temporal  SpO2: 96%  Weight: 191 lb 3.2 oz (86.7 kg)  Height: 5\' 6"  (1.676 m)     Physical Exam Vitals reviewed.  Constitutional:      General: She is not in acute distress.    Appearance: Normal appearance. She is well-developed. She is not ill-appearing.  HENT:     Head: Normocephalic and atraumatic.  Eyes:     Conjunctiva/sclera: Conjunctivae normal.     Pupils: Pupils are equal, round, and reactive to light.  Neck:     Vascular: No carotid bruit.  Cardiovascular:     Rate and Rhythm: Normal rate and regular rhythm.     Heart sounds: Normal heart  sounds.  Pulmonary:     Effort: Pulmonary effort is normal.     Breath sounds: Normal breath sounds.  Abdominal:     Palpations: Abdomen is soft. There is no pulsatile mass.     Tenderness: There is no abdominal tenderness.  Musculoskeletal:     Right lower leg: No edema.     Left lower leg: No edema.  Skin:    General: Skin is warm and dry.  Neurological:     Mental Status: She is alert and oriented to person, place, and time.  Psychiatric:        Mood and Affect: Mood normal.        Behavior: Behavior normal.        Thought Content: Thought content normal.     32 minutes spent during visit, including chart review, counseling regarding insomnia, stressors, and assimilation of information, exam, discussion of plan, and chart completion.    Assessment & Plan:  Catherine Cole is a 65 y.o. female . Upper airway cough syndrome  -Improved with use of PPI, likely laryngeal pharyngeal reflux component.  Continue trigger avoidance, PPI use up to daily with lowest effective dosing discussed.  Depression with anxiety - Plan: busPIRone (BUSPAR) 10 MG tablet  -Overall improved symptoms in spite of multiple stressors.  Decided to remain on same dose Celexa, buspirone 10 mg in the morning, 5 mg at night with rare avoidance of second dose.  Recheck next few months.  Psychophysiological insomnia  -Nonpharmacologic treatments discussed including making lists of various concerns and information on sleep hygiene/insomnia below.  Melatonin at bedtime  with lowest effective dose.  Could consider hydroxyzine if needed.  RTC precautions.  Meds ordered this encounter  Medications   busPIRone (BUSPAR) 10 MG tablet    Sig: Take 0.5-1 tablets (5-10 mg total) by mouth 2 (two) times daily.    Dispense:  180 tablet    Refill:  0   Patient Instructions  Ok to cut back on Prilosec by a few days per week if avoiding heartburn triggers and cough is controlled. Ok to use daily if needed.   Ok to  continue same dose celexa for now, same dose buspirone with 1/2 pill at night for now. Keep me posted next few months if we need to make some changes. Take care!  Try melatonin for sleep. Lowest effective dose. See other info on sleep below.  Let me know if other treatment option needed. Hydroxyzine is another option.   Insomnia Insomnia is a sleep disorder that makes it difficult to fall asleep or stay asleep. Insomnia can cause fatigue, low energy, difficulty concentrating, mood swings, and poor performance at work or school. There are three different ways to classify insomnia: Difficulty falling asleep. Difficulty staying asleep. Waking up too early in the morning. Any type of insomnia can be long-term (chronic) or short-term (acute). Both are common. Short-term insomnia usually lasts for three months or less. Chronic insomnia occurs at least three times a week for longer than three months. What are the causes? Insomnia may be caused by another condition, situation, or substance, such as: Anxiety. Certain medicines. Gastroesophageal reflux disease (GERD) or other gastrointestinal conditions. Asthma or other breathing conditions. Restless legs syndrome, sleep apnea, or other sleep disorders. Chronic pain. Menopause. Stroke. Abuse of alcohol, tobacco, or illegal drugs. Mental health conditions, such as depression. Caffeine. Neurological disorders, such as Alzheimer's disease. An overactive thyroid (hyperthyroidism). Sometimes, the cause of insomnia may not be known. What increases the risk? Risk factors for insomnia include: Gender. Women are affected more often than men. Age. Insomnia is more common as you get older. Stress. Lack of exercise. Irregular work schedule or working night shifts. Traveling between different time zones. Certain medical and mental health conditions. What are the signs or symptoms? If you have insomnia, the main symptom is having trouble falling asleep  or having trouble staying asleep. This may lead to other symptoms, such as: Feeling fatigued or having low energy. Feeling nervous about going to sleep. Not feeling rested in the morning. Having trouble concentrating. Feeling irritable, anxious, or depressed. How is this diagnosed? This condition may be diagnosed based on: Your symptoms and medical history. Your health care provider may ask about: Your sleep habits. Any medical conditions you have. Your mental health. A physical exam. How is this treated? Treatment for insomnia depends on the cause. Treatment may focus on treating an underlying condition that is causing insomnia. Treatment may also include: Medicines to help you sleep. Counseling or therapy. Lifestyle adjustments to help you sleep better. Follow these instructions at home: Eating and drinking  Limit or avoid alcohol, caffeinated beverages, and cigarettes, especially close to bedtime. These can disrupt your sleep. Do not eat a large meal or eat spicy foods right before bedtime. This can lead to digestive discomfort that can make it hard for you to sleep. Sleep habits  Keep a sleep diary to help you and your health care provider figure out what could be causing your insomnia. Write down: When you sleep. When you wake up during the night. How well you sleep. How  rested you feel the next day. Any side effects of medicines you are taking. What you eat and drink. Make your bedroom a dark, comfortable place where it is easy to fall asleep. Put up shades or blackout curtains to block light from outside. Use a white noise machine to block noise. Keep the temperature cool. Limit screen use before bedtime. This includes: Watching TV. Using your smartphone, tablet, or computer. Stick to a routine that includes going to bed and waking up at the same times every day and night. This can help you fall asleep faster. Consider making a quiet activity, such as reading, part of  your nighttime routine. Try to avoid taking naps during the day so that you sleep better at night. Get out of bed if you are still awake after 15 minutes of trying to sleep. Keep the lights down, but try reading or doing a quiet activity. When you feel sleepy, go back to bed. General instructions Take over-the-counter and prescription medicines only as told by your health care provider. Exercise regularly, as told by your health care provider. Avoid exercise starting several hours before bedtime. Use relaxation techniques to manage stress. Ask your health care provider to suggest some techniques that may work well for you. These may include: Breathing exercises. Routines to release muscle tension. Visualizing peaceful scenes. Make sure that you drive carefully. Avoid driving if you feel very sleepy. Keep all follow-up visits as told by your health care provider. This is important. Contact a health care provider if: You are tired throughout the day. You have trouble in your daily routine due to sleepiness. You continue to have sleep problems, or your sleep problems get worse. Get help right away if: You have serious thoughts about hurting yourself or someone else. If you ever feel like you may hurt yourself or others, or have thoughts about taking your own life, get help right away. You can go to your nearest emergency department or call: Your local emergency services (911 in the [place] A suicide crisis helpline, such as the [facility] at 301-171-4863 or 988 in the [place]. This is open 24 hours a day. Summary Insomnia is a sleep disorder that makes it difficult to fall asleep or stay asleep. Insomnia can be long-term (chronic) or short-term (acute). Treatment for insomnia depends on the cause. Treatment may focus on treating an underlying condition that is causing insomnia. Keep a sleep diary to help you and your health care provider figure out what could be causing  your insomnia. This information is not intended to replace advice given to you by your health care provider. Make sure you discuss any questions you have with your health care provider. Document Revised: 10/29/2020 Document Reviewed: 02/14/2020 Elsevier Patient Education  2022 [place],   Merri Ray, MD [facility] 03/19/21 7:19 PM

## 2021-03-19 NOTE — Patient Instructions (Addendum)
Ok to cut back on Prilosec by a few days per week if avoiding heartburn triggers and cough is controlled. Ok to use daily if needed.   Ok to continue same dose celexa for now, same dose buspirone with 1/2 pill at night for now. Keep me posted next few months if we need to make some changes. Take care!  Try melatonin for sleep. Lowest effective dose. See other info on sleep below.  Let me know if other treatment option needed. Hydroxyzine is another option.   Insomnia Insomnia is a sleep disorder that makes it difficult to fall asleep or stay asleep. Insomnia can cause fatigue, low energy, difficulty concentrating, mood swings, and poor performance at work or school. There are three different ways to classify insomnia: Difficulty falling asleep. Difficulty staying asleep. Waking up too early in the morning. Any type of insomnia can be long-term (chronic) or short-term (acute). Both are common. Short-term insomnia usually lasts for three months or less. Chronic insomnia occurs at least three times a week for longer than three months. What are the causes? Insomnia may be caused by another condition, situation, or substance, such as: Anxiety. Certain medicines. Gastroesophageal reflux disease (GERD) or other gastrointestinal conditions. Asthma or other breathing conditions. Restless legs syndrome, sleep apnea, or other sleep disorders. Chronic pain. Menopause. Stroke. Abuse of alcohol, tobacco, or illegal drugs. Mental health conditions, such as depression. Caffeine. Neurological disorders, such as Alzheimer's disease. An overactive thyroid (hyperthyroidism). Sometimes, the cause of insomnia may not be known. What increases the risk? Risk factors for insomnia include: Gender. Women are affected more often than men. Age. Insomnia is more common as you get older. Stress. Lack of exercise. Irregular work schedule or working night shifts. Traveling between different time zones. Certain  medical and mental health conditions. What are the signs or symptoms? If you have insomnia, the main symptom is having trouble falling asleep or having trouble staying asleep. This may lead to other symptoms, such as: Feeling fatigued or having low energy. Feeling nervous about going to sleep. Not feeling rested in the morning. Having trouble concentrating. Feeling irritable, anxious, or depressed. How is this diagnosed? This condition may be diagnosed based on: Your symptoms and medical history. Your health care provider may ask about: Your sleep habits. Any medical conditions you have. Your mental health. A physical exam. How is this treated? Treatment for insomnia depends on the cause. Treatment may focus on treating an underlying condition that is causing insomnia. Treatment may also include: Medicines to help you sleep. Counseling or therapy. Lifestyle adjustments to help you sleep better. Follow these instructions at home: Eating and drinking  Limit or avoid alcohol, caffeinated beverages, and cigarettes, especially close to bedtime. These can disrupt your sleep. Do not eat a large meal or eat spicy foods right before bedtime. This can lead to digestive discomfort that can make it hard for you to sleep. Sleep habits  Keep a sleep diary to help you and your health care provider figure out what could be causing your insomnia. Write down: When you sleep. When you wake up during the night. How well you sleep. How rested you feel the next day. Any side effects of medicines you are taking. What you eat and drink. Make your bedroom a dark, comfortable place where it is easy to fall asleep. Put up shades or blackout curtains to block light from outside. Use a white noise machine to block noise. Keep the temperature cool. Limit screen use before bedtime. This  includes: Watching TV. Using your smartphone, tablet, or computer. Stick to a routine that includes going to bed and  waking up at the same times every day and night. This can help you fall asleep faster. Consider making a quiet activity, such as reading, part of your nighttime routine. Try to avoid taking naps during the day so that you sleep better at night. Get out of bed if you are still awake after 15 minutes of trying to sleep. Keep the lights down, but try reading or doing a quiet activity. When you feel sleepy, go back to bed. General instructions Take over-the-counter and prescription medicines only as told by your health care provider. Exercise regularly, as told by your health care provider. Avoid exercise starting several hours before bedtime. Use relaxation techniques to manage stress. Ask your health care provider to suggest some techniques that may work well for you. These may include: Breathing exercises. Routines to release muscle tension. Visualizing peaceful scenes. Make sure that you drive carefully. Avoid driving if you feel very sleepy. Keep all follow-up visits as told by your health care provider. This is important. Contact a health care provider if: You are tired throughout the day. You have trouble in your daily routine due to sleepiness. You continue to have sleep problems, or your sleep problems get worse. Get help right away if: You have serious thoughts about hurting yourself or someone else. If you ever feel like you may hurt yourself or others, or have thoughts about taking your own life, get help right away. You can go to your nearest emergency department or call: Your local emergency services (911 in the [place] A suicide crisis helpline, such as the [facility] at (567)070-8500 or 988 in the [place]. This is open 24 hours a day. Summary Insomnia is a sleep disorder that makes it difficult to fall asleep or stay asleep. Insomnia can be long-term (chronic) or short-term (acute). Treatment for insomnia depends on the cause. Treatment may focus on treating  an underlying condition that is causing insomnia. Keep a sleep diary to help you and your health care provider figure out what could be causing your insomnia. This information is not intended to replace advice given to you by your health care provider. Make sure you discuss any questions you have with your health care provider. Document Revised: 10/29/2020 Document Reviewed: 02/14/2020 Elsevier Patient Education  2022 [employer].

## 2021-03-20 ENCOUNTER — Ambulatory Visit: Payer: BC Managed Care – PPO | Attending: Surgery

## 2021-03-20 ENCOUNTER — Other Ambulatory Visit: Payer: Self-pay

## 2021-03-20 DIAGNOSIS — I890 Lymphedema, not elsewhere classified: Secondary | ICD-10-CM | POA: Insufficient documentation

## 2021-03-20 DIAGNOSIS — D0511 Intraductal carcinoma in situ of right breast: Secondary | ICD-10-CM | POA: Insufficient documentation

## 2021-03-20 NOTE — Therapy (Addendum)
[facility] @ [facility] [place], [place], [address] Phone: [phone]   Fax:  [phone]  Physical Therapy Treatment  Patient Details  Name: Catherine Cole MRN: [id number] Date of Birth: [birth year] Referring Provider (PT): Dr. Brantley Stage   Encounter Date: 03/20/2021   PT End of Session - 03/20/21 1140     Visit Number 8    Number of Visits 13    Date for PT Re-Evaluation 04/01/21    PT Start Time 1109    PT Stop Time 1155    PT Time Calculation (min) 46 min    Activity Tolerance Patient tolerated treatment well    Behavior During Therapy [facility] for tasks assessed/performed             Past Medical History:  Diagnosis Date   Adult ADHD    Anemia    Anxiety    Cancer ([place])    Phreesia 94/49/6759   Complication of anesthesia    Depression    Depression    Phreesia 05/13/2020   Family history of breast cancer    Family history of colon cancer    Family history of kidney cancer    Family history of melanoma    Family history of ovarian cancer    Hypertension    IBS (irritable bowel syndrome)    PONV (postoperative nausea and vomiting)     Past Surgical History:  Procedure Laterality Date   ABDOMINAL HYSTERECTOMY N/A    Phreesia 05/13/2020   APPENDECTOMY     12 yrs   BREAST SURGERY N/A    Phreesia 05/13/2020   CHOLECYSTECTOMY  1995   CHOLESTEATOMA EXCISION     13 and 22 yrs   [place] N/A 12/03/2015   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;  Surgeon: [name], DO;  Location: [facility];  Service: Plastics;  Laterality: N/A;   MASTECTOMY W/ SENTINEL NODE BIOPSY Bilateral 07/25/2019   Procedure: BILATERAL MASTECTOMY WITH RIGHT SENTINEL LYMPH NODE BIOPSY, LEFT RISK REDUCING MASTECTOMY;  Surgeon: [name], MD;  Location: [facility];  Service: General;  Laterality:  Bilateral;  PEC BLOCK   TONSILLECTOMY     15 yrs    There were no vitals filed for this visit.   Subjective Assessment - 03/20/21 1110     Subjective I will have the pump trial on Tuesday . I have done the MLD several times at home, but I'm not sure if I am ding it right.   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 0/2 lymph nodes positive. If she overheats she gets a dry cough and chokes, IBS, prior brain tumor, ADD    Patient Stated Goals address lymphedema to get normal life back.    Currently in Pain? No/denies    Pain Score 0-No pain                               [facility] Adult PT Treatment/Exercise - 03/20/21 0001       Manual Therapy   Manual Therapy Manual Lymphatic Drainage (MLD)    Manual Lymphatic Drainage (MLD) In Supine: PT peformed Short neck, superficial and deep abdominals, Lt axillary and inguinal nodes, Lt axillo-inguinal anastomosis, then Lt UE working proximal to distal then retracing all  steps.  Pt then performed self MLD to the right side in supine with head of table elevated and in sitting with mirror so she could see better including sequence, stretch, pressure.                          PT Long Term Goals - 03/05/21 1703       PT LONG TERM GOAL #1   Title Pt will be independent in self MLD    Baseline instructed but not yet independent    Time 4    Period Weeks    Status On-going    Target Date 04/02/21      PT LONG TERM GOAL #2   Title Pt will note improvement in trunk/UE swelling by 50%    Baseline improving    Time 4    Period Weeks    Status On-going    Target Date 04/02/21      PT LONG TERM GOAL #3   Title Pt will be educated in a HEP for UE strengthening/    Time 4    Period Weeks    Status New    Target Date 04/02/21                   Plan - 03/20/21 1141     Clinical Impression Statement PT performed MLD to pts  left arm and trunk  with education to pt while performing.  Pt then performed MLD to right trunk in supine with table elevated and in sitting to use the mirror.  Pt used excellent technique and seems to have a much better understanding of proper sequence.today.  She has her flexi touch trial next week.  She will skip next week and return the following week for review    Personal Factors and Comorbidities Comorbidity 1    Comorbidities Bil masectomy with 2 lymph node removal on the R    Stability/Clinical Decision Making Stable/Uncomplicated    Rehab Potential Good    PT Frequency 2x / week    PT Duration 4 weeks    PT Treatment/Interventions ADLs/Self Care Home Management;Therapeutic exercise;Manual techniques;Patient/family education;Manual lymph drainage;Vasopneumatic Device    PT Next Visit Plan MLD to UE's/trunk and review with pt, Flexi touch demo set up yet to be Tues. Dec 6 , initiate strength   PT Home Exercise Plan self MLD    Consulted and Agree with Plan of Care Patient             Patient will benefit from skilled therapeutic intervention in order to improve the following deficits and impairments:  Decreased knowledge of precautions, Increased edema, Postural dysfunction  Visit Diagnosis: Ductal carcinoma in situ (DCIS) of right breast  Lymphedema, not elsewhere classified     Problem List Patient Active Problem List   Diagnosis Date Noted   Genetic testing 08/03/2019   Breast cancer, right ([place]) 07/25/2019   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   Family history of ovarian cancer    Family history of breast cancer    Family history of colon cancer    Family history of kidney cancer    Family history of melanoma    Vitamin D deficiency 12/12/2018   Essential hypertension, benign 10/15/2015   Kidney stone 05/02/2015   Palpitations 02/08/2015   Atypical chest pain 02/08/2015   Exertional dyspnea 02/08/2015   Lower extremity edema 02/08/2015   Mixed  dyslipidemia 03/26/2014  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/03/2013   Depression with anxiety 04/26/2012   ADHD (attention deficit hyperactivity disorder) 04/26/2012  PHYSICAL THERAPY DISCHARGE SUMMARY  Visits from Start of Care: 8  Current functional level related to goals / functional outcomes: Pt was progressing with goals, but had not yet achieved   Remaining deficits: Unknown. Pt did not return   Education / Equipment: HEP   Patient agrees to discharge. Patient goals were not met. Patient is being discharged due to not returning since the last visit.   Claris Pong, PT 03/20/2021, 12:17 PM  [facility] @ [facility] [place], [place], [address] Phone: [phone]   Fax:  [phone]  Name: Giamarie Bueche MRN: [id number] Date of Birth: [birth year]

## 2021-03-23 ENCOUNTER — Encounter: Payer: Self-pay | Admitting: Internal Medicine

## 2021-03-23 ENCOUNTER — Other Ambulatory Visit: Payer: Self-pay

## 2021-03-23 ENCOUNTER — Ambulatory Visit: Payer: BC Managed Care – PPO | Admitting: Internal Medicine

## 2021-03-23 VITALS — BP 146/90 | HR 96 | Ht 66.0 in | Wt 187.0 lb

## 2021-03-23 DIAGNOSIS — R7989 Other specified abnormal findings of blood chemistry: Secondary | ICD-10-CM

## 2021-03-23 DIAGNOSIS — M816 Localized osteoporosis [Lequesne]: Secondary | ICD-10-CM

## 2021-03-23 DIAGNOSIS — E559 Vitamin D deficiency, unspecified: Secondary | ICD-10-CM

## 2021-03-23 DIAGNOSIS — E876 Hypokalemia: Secondary | ICD-10-CM

## 2021-03-23 LAB — BASIC METABOLIC PANEL
BUN: 16 mg/dL (ref 6–23)
CO2: 32 mEq/L (ref 19–32)
Calcium: 10.4 mg/dL (ref 8.4–10.5)
Chloride: 95 mEq/L — ABNORMAL LOW (ref 96–112)
Creatinine, Ser: 0.83 mg/dL (ref 0.40–1.20)
GFR: 74.18 mL/min (ref >60.00)
Glucose, Bld: 86 mg/dL (ref 70–99)
Potassium: 4.1 mEq/L (ref 3.5–5.1)
Sodium: 137 mEq/L (ref 135–145)

## 2021-03-23 LAB — ALBUMIN: Albumin: 4.8 g/dL (ref 3.5–5.2)

## 2021-03-23 LAB — VITAMIN D 25 HYDROXY (VIT D DEFICIENCY, FRACTURES): VITD: 39.15 ng/mL (ref 30.00–100.00)

## 2021-03-23 LAB — T4, FREE: Free T4: 0.76 ng/dL (ref 0.60–1.60)

## 2021-03-23 LAB — TSH: TSH: 2.98 u[IU]/mL (ref 0.35–5.50)

## 2021-03-23 MED ORDER — DEXAMETHASONE 1 MG PO TABS: 1.0000 mg | ORAL_TABLET | Freq: Once | ORAL | 0 refills | Status: AC

## 2021-03-23 NOTE — Progress Notes (Signed)
Name: Catherine Cole  MRN/ DOB: [id number], Mar 30, 1956    Age/ Sex: 65 y.o., female    PCP: [name], MD   Reason for Endocrinology Evaluation: Osteoporosis      Date of Initial Endocrinology Evaluation: 03/23/2021     HPI: Catherine Cole is a 65 y.o. female with a past medical history of Hx of breast Ca , S/P mastectomy (07/2019), HTN, IBS . The patient presented for initial endocrinology clinic visit on 03/23/2021 for consultative assistance with her Osteoporosis .   Pt was diagnosed with osteoporosis: 08/2020 with a T-score of -2.7 at the left femoral neck     Menarche at age : 67 Menopausal at age : She had hysterectomy in her 59's ( ovaries intact) Fracture Hx: Left wrist while skating  Hx of HRT: no FH of osteoporosis or hip fracture: mother  Prior Hx of anti-estrogenic therapy :no  Prior Hx of anti-resorptive therapy : no   She has chronic hypokalemia as well as Vitamin D deficiency .    She is not calcium  She is on potassium 10 mEQ , two tablets a day  Vitamin D Ergocalciferol 50, 000 iu weekly    She was on HCTZ in the past for HTn which was switched to chlorthalidone    Has heartburn symptoms and started prilosec ~ 2 months ago  She also has IBS -diarrhea   She has lymphedema Bilateral upper extremities , She is S/P right axillary lymph node removal  but none on the left.  But has lymphedema on the left too.       HISTORY:  Past Medical History:  Past Medical History:  Diagnosis Date   Adult ADHD    Anemia    Anxiety    Cancer ([place])    Phreesia 40/01/2724   Complication of anesthesia    Depression    Depression    Phreesia 05/13/2020   Family history of breast cancer    Family history of colon cancer    Family history of kidney cancer    Family history of melanoma    Family history of ovarian cancer    Hypertension    IBS (irritable bowel syndrome)    PONV (postoperative nausea and vomiting)    Past Surgical  History:  Past Surgical History:  Procedure Laterality Date   ABDOMINAL HYSTERECTOMY N/A    Phreesia 05/13/2020   APPENDECTOMY     12 yrs   BREAST SURGERY N/A    Phreesia 05/13/2020   CHOLECYSTECTOMY  1995   CHOLESTEATOMA EXCISION     13 and 22 yrs   FRACTURE SURGERY     KIDNEY STONE SURGERY  1995   LESION DESTRUCTION N/A 12/03/2015   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;  Surgeon: [name], DO;  Location: [facility];  Service: Plastics;  Laterality: N/A;   MASTECTOMY W/ SENTINEL NODE BIOPSY Bilateral 07/25/2019   Procedure: BILATERAL MASTECTOMY WITH RIGHT SENTINEL LYMPH NODE BIOPSY, LEFT RISK REDUCING MASTECTOMY;  Surgeon: [name], MD;  Location: [facility];  Service: General;  Laterality: Bilateral;  PEC BLOCK   TONSILLECTOMY     15 yrs    Social History:  reports that she has quit smoking. Her smoking use included cigarettes. She has a 7.00 pack-year smoking history. She has never used smokeless tobacco. She reports that she does not drink alcohol and does not use drugs. Family History: family history includes Alcohol abuse in  her father; Brain cancer in her maternal aunt; Breast cancer in her cousin; Colon cancer in her cousin; Colon polyps in her brother, mother, sister, sister, and son; Diabetes in her father; Heart disease (age of onset: 22) in her mother; Kidney cancer in her cousin; Lung cancer in her maternal uncle; Melanoma in her maternal aunt and mother; Ovarian cancer in her maternal aunt.   HOME MEDICATIONS: Allergies as of 03/23/2021       Reactions   Latex Hives   Demerol Nausea And Vomiting   Codeine Nausea And Vomiting   Erythromycin Itching, Rash   Neosporin [neomycin-polymyxin B Gu] Itching, Rash        Medication List        Accurate as of March 23, 2021  1:15 PM. If you have any questions, ask your nurse or doctor.          busPIRone 10 MG tablet Commonly known as:  BUSPAR Take 0.5-1 tablets (5-10 mg total) by mouth 2 (two) times daily.   chlorthalidone 25 MG tablet Commonly known as: HYGROTON TAKE 1 TABLET (25 MG TOTAL) BY MOUTH DAILY.   citalopram 40 MG tablet Commonly known as: CELEXA TAKE 1 TABLET BY MOUTH EVERY DAY   Depend Easy Fit Undergarments Misc Use daily as directed. Change several times daily as needed.   lisdexamfetamine 50 MG capsule Commonly known as: Vyvanse Take 1 capsule (50 mg total) by mouth daily. Ok to fill in 60 days What changed: Another medication with the same name was removed. Continue taking this medication, and follow the directions you see here. Changed by: Dorita Sciara, MD   lisdexamfetamine 50 MG capsule Commonly known as: Vyvanse Take 1 capsule (50 mg total) by mouth daily. What changed: Another medication with the same name was removed. Continue taking this medication, and follow the directions you see here. Changed by: Dorita Sciara, MD   losartan 50 MG tablet Commonly known as: COZAAR TAKE 1 TABLET BY MOUTH EVERY DAY   multivitamin with minerals Tabs tablet Take 1 tablet by mouth daily.   potassium chloride 10 MEQ tablet Commonly known as: KLOR-CON Take 2 tablets (20 mEq total) by mouth daily.   valACYclovir 500 MG tablet Commonly known as: VALTREX Take 2 tablets by mouth at acute onset for outbreak, followed by 1 tablet once a day x 5 days   Vitamin D (Ergocalciferol) 1.25 MG (50000 UNIT) Caps capsule Commonly known as: DRISDOL Take 1 capsule (50,000 Units total) by mouth every 7 (seven) days.          REVIEW OF SYSTEMS: A comprehensive ROS was conducted with the patient and is negative except as per HPI    OBJECTIVE:  VS: BP (!) 146/90 (BP Location: Left Arm, Patient Position: Sitting, Cuff Size: Small)   Pulse 96   Ht 5\' 6"  (1.676 m)   Wt 187 lb (84.8 kg)   SpO2 99%   BMI 30.18 kg/m    Wt Readings from Last 3 Encounters:  03/23/21 187 lb (84.8 kg)  03/19/21  191 lb 3.2 oz (86.7 kg)  01/29/21 188 lb (85.3 kg)     EXAM: General: Pt appears well and is in NAD  Neck: General: Supple without adenopathy. Thyroid: Thyroid size normal.  No goiter or nodules appreciated.   Lungs: Clear with good BS bilat with no rales, rhonchi, or wheezes  Heart: Auscultation: RRR.  Abdomen: Normoactive bowel sounds, soft, nontender, without masses or organomegaly palpable  Extremities:  BL LE: No  pretibial edema normal ROM and strength.  Skin: Hair: Texture and amount normal with gender appropriate distribution Skin Inspection: No rashes Skin Palpation: Skin temperature, texture, and thickness normal to palpation  Neuro:  DTRs: 2+ and symmetric in UE without delay in relaxation phase  Mental Status: Judgment, insight: Intact Orientation: Oriented to time, place, and person Mood and affect: No depression, anxiety, or agitation     DATA REVIEWED:     Latest Reference Range & Units 03/23/21 13:52  Sodium 135 - 145 mEq/L 137  Potassium 3.5 - 5.1 mEq/L 4.1  Chloride 96 - 112 mEq/L 95 (L)  CO2 19 - 32 mEq/L 32  Glucose 70 - 99 mg/dL 86  BUN 6 - 23 mg/dL 16  Creatinine 0.40 - 1.20 mg/dL 0.83  Calcium 8.4 - 10.5 mg/dL 10.4  Albumin 3.5 - 5.2 g/dL 4.8  GFR >60.00 mL/min 74.18  VITD 30.00 - 100.00 ng/mL 39.15  PTH, Intact 16 - 77 pg/mL 58  TSH 0.35 - 5.50 uIU/mL 2.98  T4,Free(Direct) 0.60 - 1.60 ng/dL 0.76    ASSESSMENT/PLAN/RECOMMENDATIONS:   Osteoporosis   - Emphasized the importance of  proper calcium and Vitamin D supplementation . Since she is on PPI, I have recommended Calcium citrate for better absorption profile   - I have recommended bisphosphonate therapy , she prefers the infusion rather then the tablets due to GERD symptoms  We discussed rare side effect of atypical fractures and osteonecrosis. Pt encouraged to have an updated dental exam - The Delima is can she receive zoledronic acid with the presence of lymphedema ? She will check with  her physical therapist and I will check with our RN about this  - She understands she needs some form of anti-resorptive therapy    Medications : Calcium Citrate 1200 mg daily    2. Hypokalemia :  - Multifactorial given diuretic therapy and IBS-D  - Will check for hyperaldo today , she will return for dexamethasone suppression  test for cushing syndrome screening    3. Vitamin D Deficiency :  - Vitamin D is normal , will continue   Medication  Continue Ergocalciferol 50,000 iu weekly   Signed electronically by: Mack Guise, MD  [facility]  [facility] [address], [place], [place] [address] Phone: [phone] FAX: [phone]   CC: [name], MD [address] [place] [address] Phone: [phone] Fax: [phone]   Return to Endocrinology clinic as below: Future Appointments  Date Time Provider [place]  03/23/2021  1:20 PM Donye Dauenhauer, [name], MD [id number] None  06/18/2021  3:40 PM [name], MD [facility]

## 2021-03-23 NOTE — Patient Instructions (Addendum)
Calcium Citrate 1000- 1200 mg daily  Continue Ergocalciferol 50,000 iu weekly     Please check with the physical therapist about taking Reclast infusion in the left arm    Instructions for Dexamethasone Suppression Test   Step 1: Choose a morning when you can come to our lab at 8:00 am for a blood draw.   Step 2: On the night before the blood draw, take one 1 mg tablet of dexamethasone at 11:30 pm.  The timing is VERY important!   Step 3: The next morning, go to the lab for blood work at 8:00 am.  Dennis Bast do not have to be on an empty stomach, but the timing is VERY important!

## 2021-03-24 MED ORDER — VITAMIN D (ERGOCALCIFEROL) 1.25 MG (50000 UNIT) PO CAPS: 50000.0000 [IU] | ORAL_CAPSULE | ORAL | 1 refills | Status: DC

## 2021-03-30 LAB — ALDOSTERONE + RENIN ACTIVITY W/ RATIO
ALDO / PRA Ratio: 0.7 Ratio — ABNORMAL LOW (ref 0.9–28.9)
Aldosterone: 12 ng/dL
Renin Activity: 16.64 ng/mL/h — ABNORMAL HIGH (ref 0.25–5.82)

## 2021-03-30 LAB — PARATHYROID HORMONE, INTACT (NO CA): PTH: 58 pg/mL (ref 16–77)

## 2021-04-01 ENCOUNTER — Other Ambulatory Visit (INDEPENDENT_AMBULATORY_CARE_PROVIDER_SITE_OTHER): Payer: BC Managed Care – PPO

## 2021-04-01 ENCOUNTER — Other Ambulatory Visit: Payer: Self-pay

## 2021-04-01 DIAGNOSIS — E876 Hypokalemia: Secondary | ICD-10-CM | POA: Diagnosis not present

## 2021-04-01 LAB — CORTISOL: Cortisol, Plasma: 0.7 ug/dL

## 2021-04-09 ENCOUNTER — Other Ambulatory Visit: Payer: Self-pay | Admitting: Family Medicine

## 2021-04-09 DIAGNOSIS — F900 Attention-deficit hyperactivity disorder, predominantly inattentive type: Secondary | ICD-10-CM

## 2021-04-09 MED ORDER — LISDEXAMFETAMINE DIMESYLATE 50 MG PO CAPS: 50.0000 mg | ORAL_CAPSULE | Freq: Every day | ORAL | 0 refills | Status: DC

## 2021-04-09 NOTE — Telephone Encounter (Signed)
Controlled substance database (PDMP) reviewed. No concerns appreciated.  Last filled 03/13/2021, refill ordered with upcoming holiday.  Meds discussed at her most recent visits.

## 2021-05-14 ENCOUNTER — Other Ambulatory Visit: Payer: Self-pay | Admitting: Family Medicine

## 2021-05-14 ENCOUNTER — Other Ambulatory Visit: Payer: Self-pay

## 2021-05-19 ENCOUNTER — Other Ambulatory Visit: Payer: Self-pay | Admitting: Family Medicine

## 2021-05-19 DIAGNOSIS — F900 Attention-deficit hyperactivity disorder, predominantly inattentive type: Secondary | ICD-10-CM

## 2021-05-20 MED ORDER — LISDEXAMFETAMINE DIMESYLATE 50 MG PO CAPS: 50.0000 mg | ORAL_CAPSULE | Freq: Every day | ORAL | 0 refills | Status: DC

## 2021-05-20 NOTE — Telephone Encounter (Signed)
Controlled substance database (PDMP) reviewed. No concerns appreciated.  Last filled 04/10/2021.  Meds were discussed at recent visits.  Refill ordered.

## 2021-05-20 NOTE — Telephone Encounter (Signed)
Patient is requesting a refill of the following medications: Requested Prescriptions   Pending Prescriptions Disp Refills   lisdexamfetamine (VYVANSE) 50 MG capsule 30 capsule 0    Sig: Take 1 capsule (50 mg total) by mouth daily.    Date of patient request: 05/19/2021 Last office visit: 03/19/2021 Date of last refill: 04/09/2021 Last refill amount: 30 capsules Follow up time period per chart: 06/18/2021

## 2021-06-14 ENCOUNTER — Other Ambulatory Visit: Payer: Self-pay | Admitting: Family Medicine

## 2021-06-14 DIAGNOSIS — E876 Hypokalemia: Secondary | ICD-10-CM

## 2021-06-15 NOTE — Telephone Encounter (Signed)
Normal potassium in December on supplementation, continue same.  Refills ordered.

## 2021-06-15 NOTE — Telephone Encounter (Signed)
Pt had normal potassium in December should she continue supplementation?

## 2021-06-18 ENCOUNTER — Ambulatory Visit: Payer: BC Managed Care – PPO | Admitting: Family Medicine

## 2021-06-20 ENCOUNTER — Other Ambulatory Visit: Payer: Self-pay | Admitting: Family Medicine

## 2021-06-20 DIAGNOSIS — F900 Attention-deficit hyperactivity disorder, predominantly inattentive type: Secondary | ICD-10-CM

## 2021-06-22 IMAGING — MG MM BREAST BX W/ LOC DEV 1ST LESION IMAGE BX SPEC STEREO GUIDE*R*
8 of 11 series · 8 of 27 positions shown · non-contrast
Comparison: Previous exams.
COMPARISON: Previous exams.

Addendum:
CLINICAL DATA: Patient presents for stereotactic core needle biopsy
of right breast calcifications.

EXAM:
RIGHT BREAST STEREOTACTIC CORE NEEDLE BIOPSY

[R (1 of 7)]
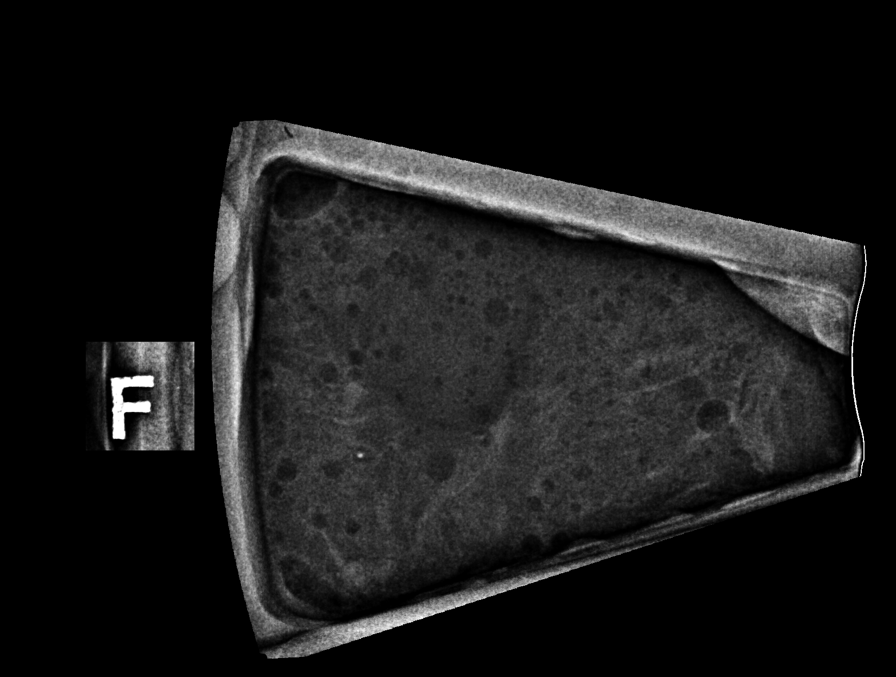

[R (2 of 7)]
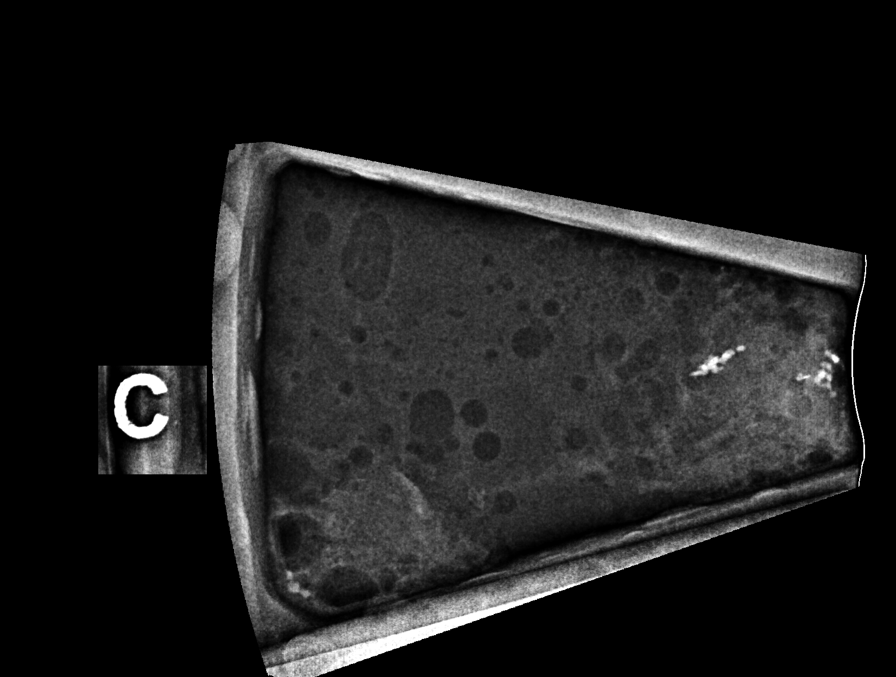

[R (3 of 7)]
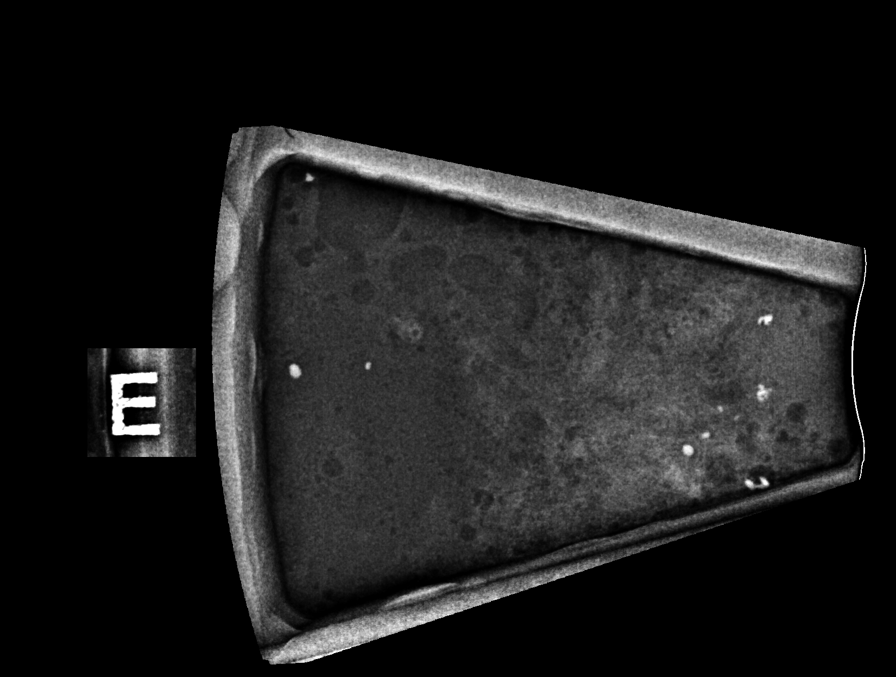

[R (4 of 7)]
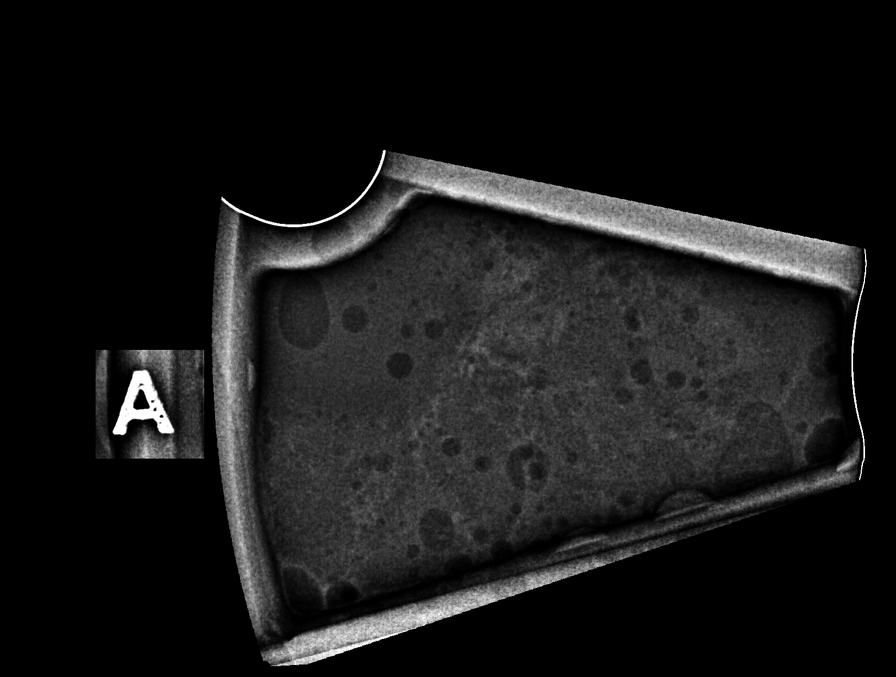

[R (5 of 7)]
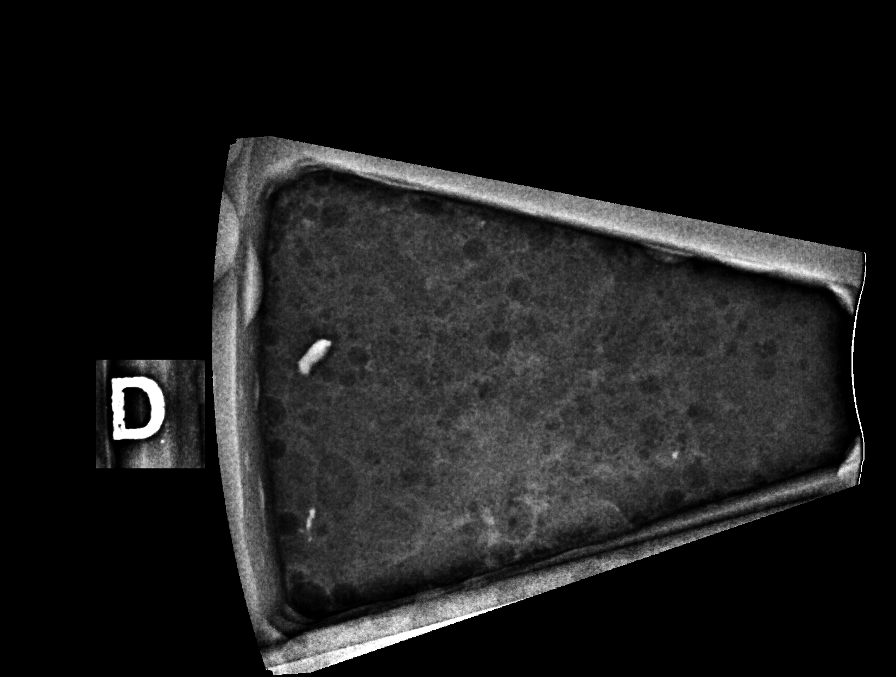

[R (6 of 7)]
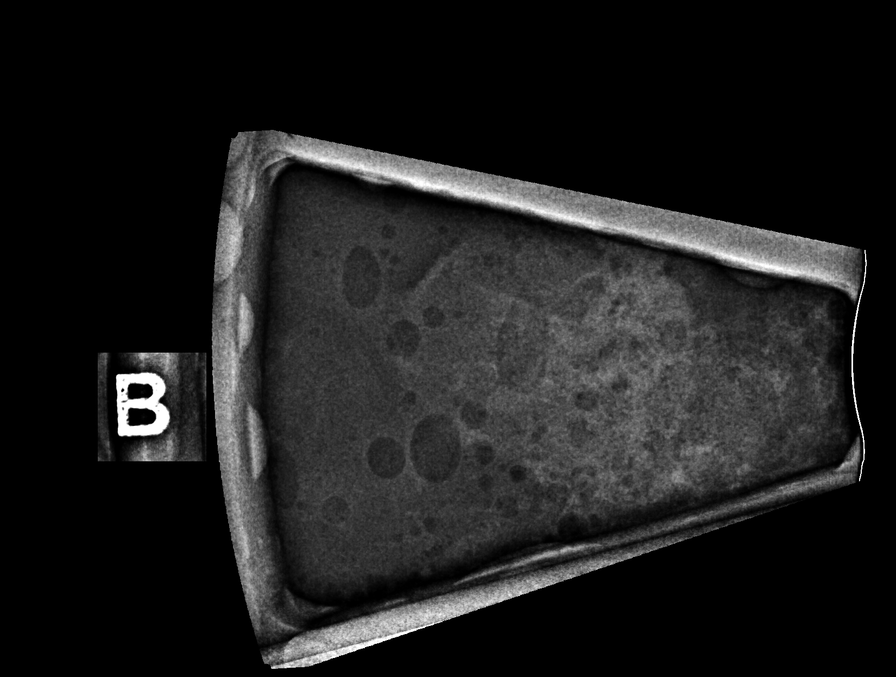

[R (7 of 7)]
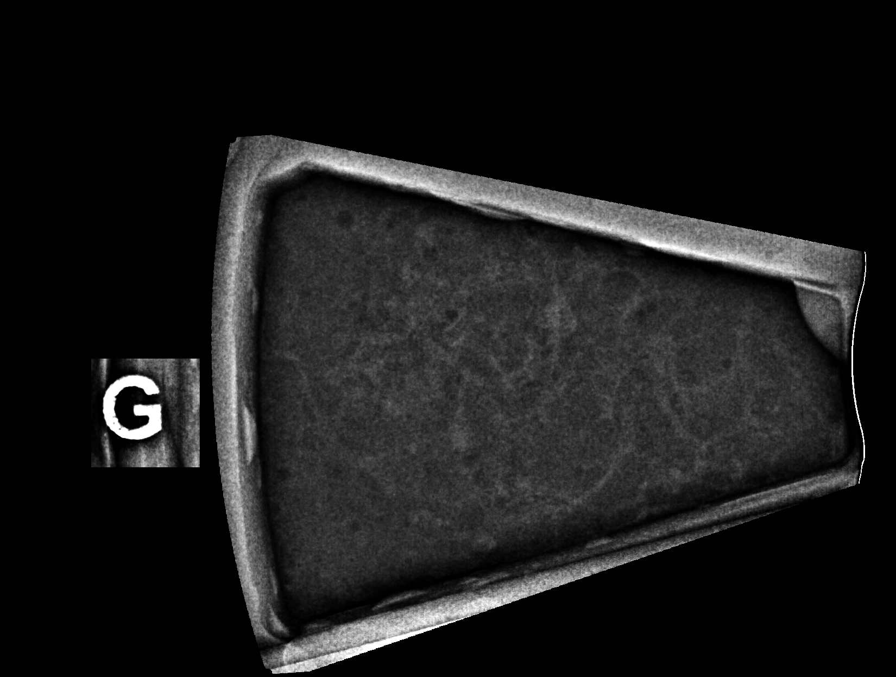

[R LM tomo · tomo slice 44/87.0]
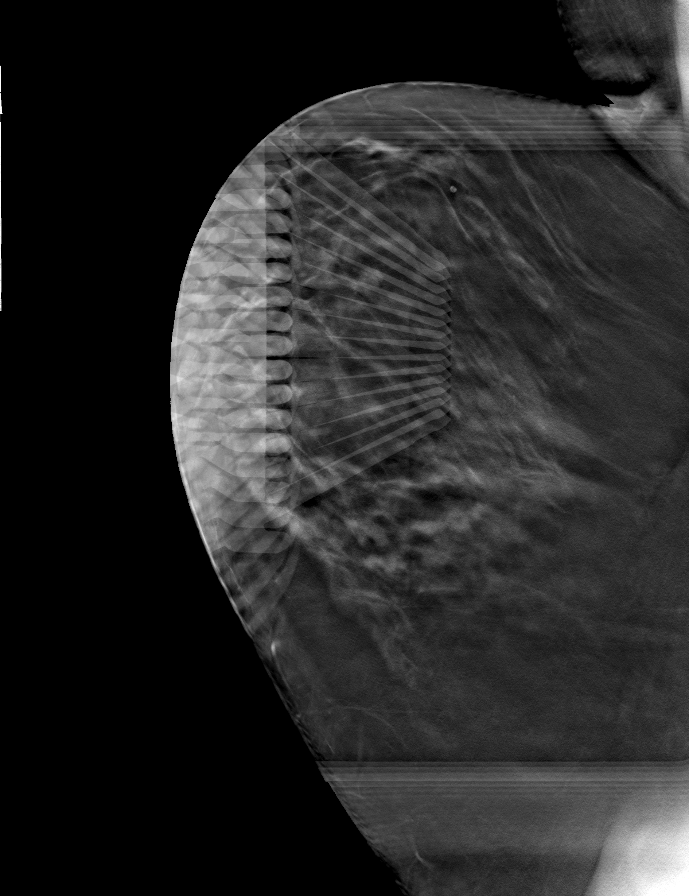

[8 of 27 positions shown; findings below may reference images not displayed]



Using sterile technique and 1% Lidocaine as local anesthetic, under
stereotactic guidance, a 9 gauge vacuum assisted device was used to
perform core needle biopsy of calcifications in the lower outer
quadrant of the left breast using a lateral approach. Specimen
radiograph was performed showing multiple calcifications for which
biopsy was performed. Specimens with calcifications are identified
for pathology.

Lesion quadrant: Lower outer quadrant

At the conclusion of the procedure, coil shaped tissue marker clip
was deployed into the biopsy cavity. Follow-up 2-view mammogram was
performed and dictated separately.
IMPRESSION: Stereotactic-guided biopsy of right breast calcifications. No
apparent complications.

ADDENDUM:
Pathology revealed FOCI OF HIGH-GRADE DUCTAL CARCINOMA IN SITU WITH
CALCIFICATIONS of the RIGHT breast, lateral at 8:30 o'clock. This
was found to be concordant by Dr. Ost Pur.

Pathology results were discussed with the patient by telephone. The
patient reported doing well after the biopsy with tenderness at the
site. Post biopsy instructions and care were reviewed and questions
were answered. The patient was encouraged to call The [REDACTED]

Surgical consultation has been arranged with Dr. Silvia Pilar Ellwanger at
[REDACTED] on July 09, 2019.

Breast MRI is recommended for extent.

Pathology results reported by Viktor Mayorquin RN on 07/03/2019.



Using sterile technique and 1% Lidocaine as local anesthetic, under
stereotactic guidance, a 9 gauge vacuum assisted device was used to
perform core needle biopsy of calcifications in the lower outer
quadrant of the left breast using a lateral approach. Specimen
radiograph was performed showing multiple calcifications for which
biopsy was performed. Specimens with calcifications are identified
for pathology.

Lesion quadrant: Lower outer quadrant

At the conclusion of the procedure, coil shaped tissue marker clip
was deployed into the biopsy cavity. Follow-up 2-view mammogram was
performed and dictated separately.
IMPRESSION: Stereotactic-guided biopsy of right breast calcifications. No
apparent complications.

## 2021-06-22 IMAGING — MG MM BREAST LOCALIZATION CLIP
4 series · 4 of 12 positions shown · non-contrast
Comparison: Previous exam(s).

CLINICAL DATA: Status post stereotactic core needle biopsy of right
breast calcifications. Post biopsy marker clip placement assessment.

EXAM:
DIAGNOSTIC RIGHT MAMMOGRAM POST STEREOTACTIC BIOPSY

[R CC synth-2D]
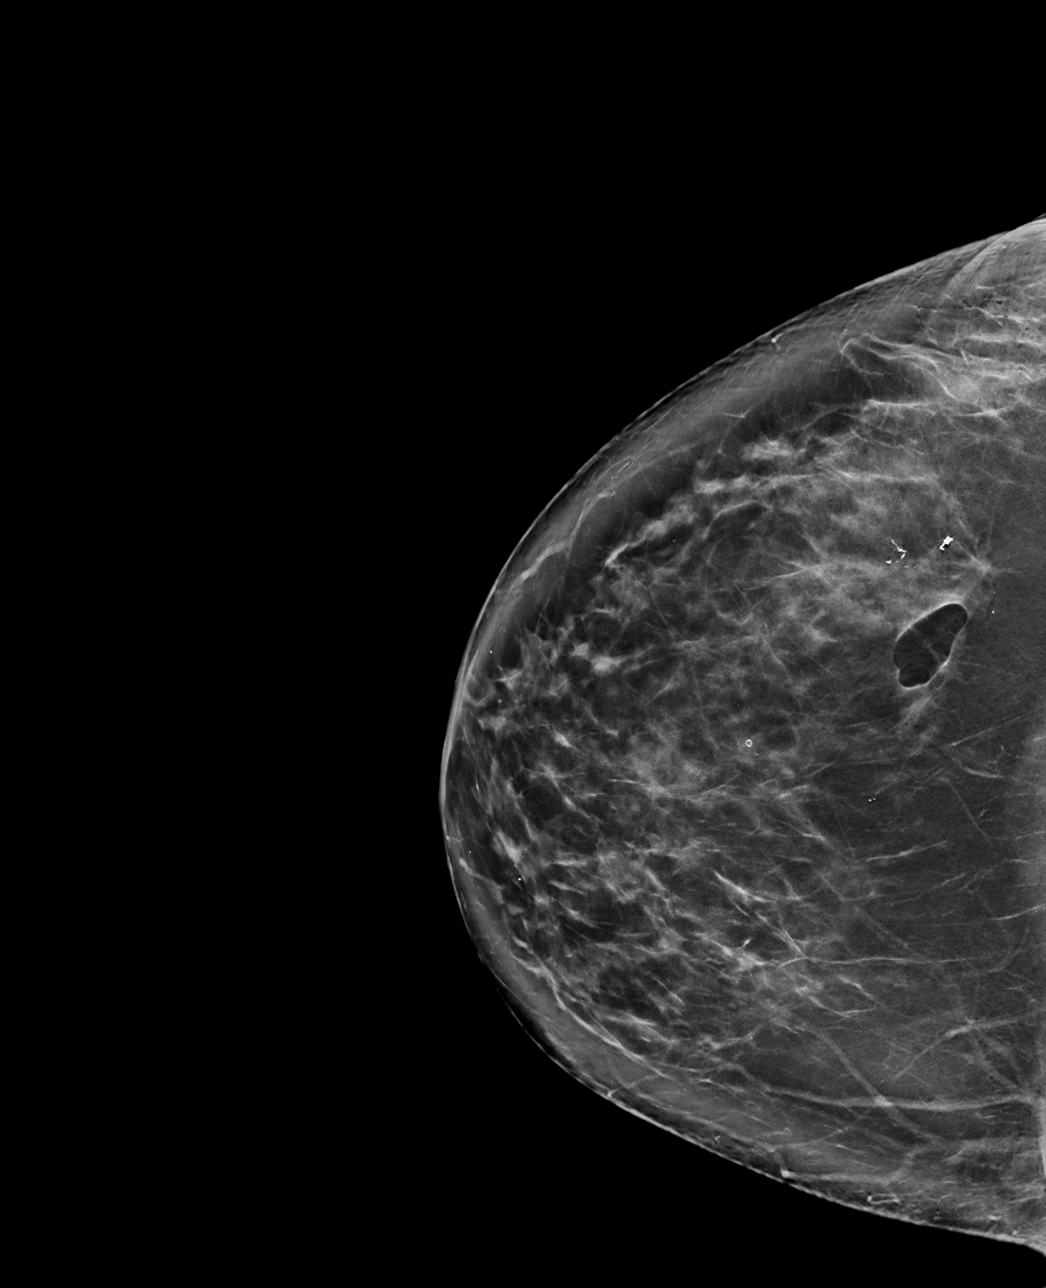

[R LM synth-2D]
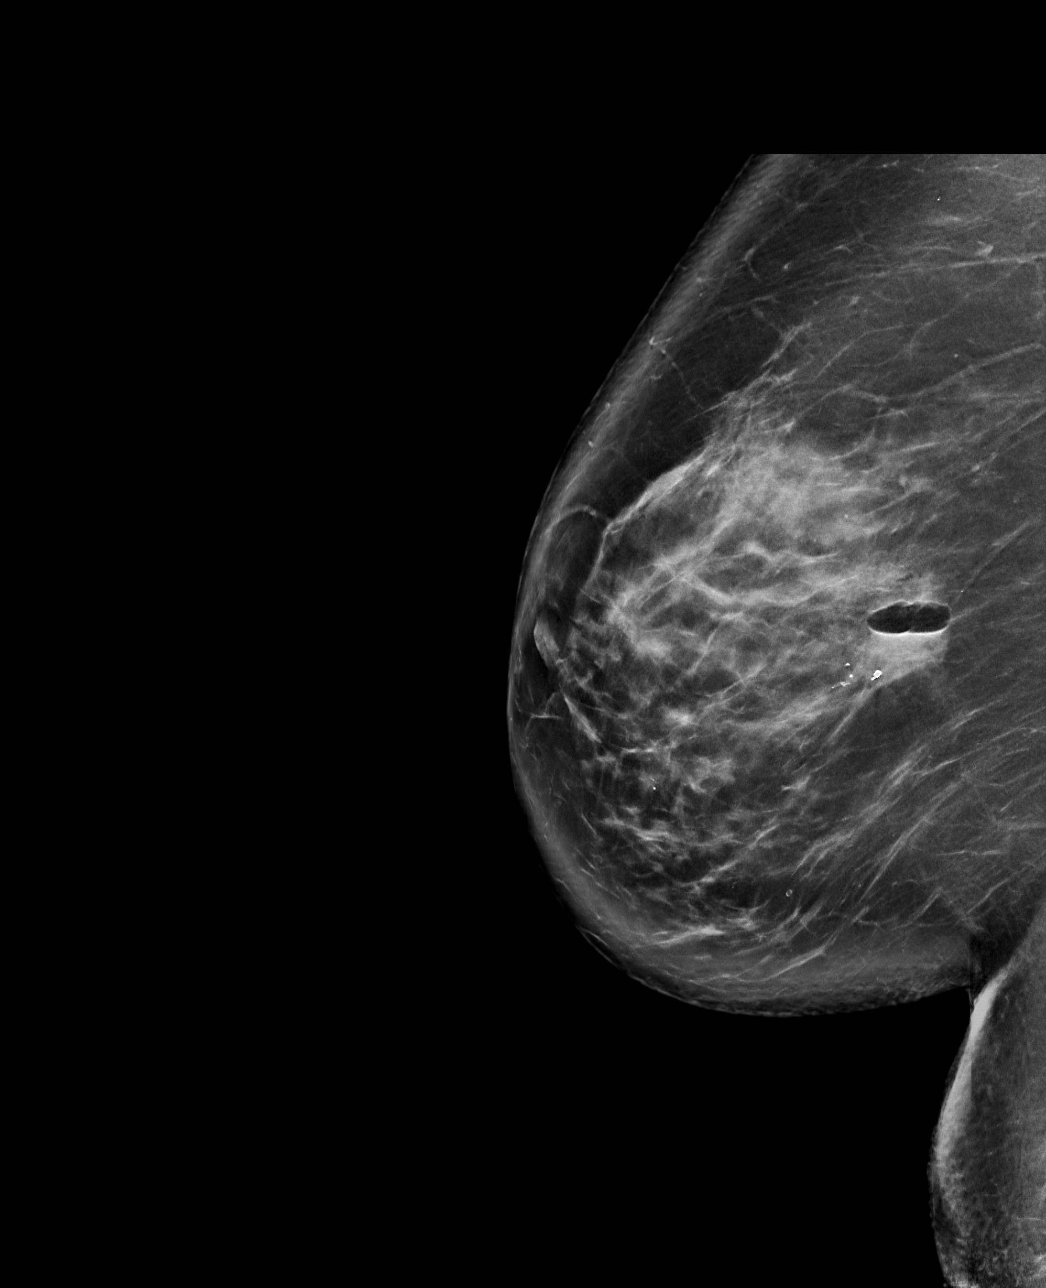

[R LM tomo · tomo slice 58/115.0]
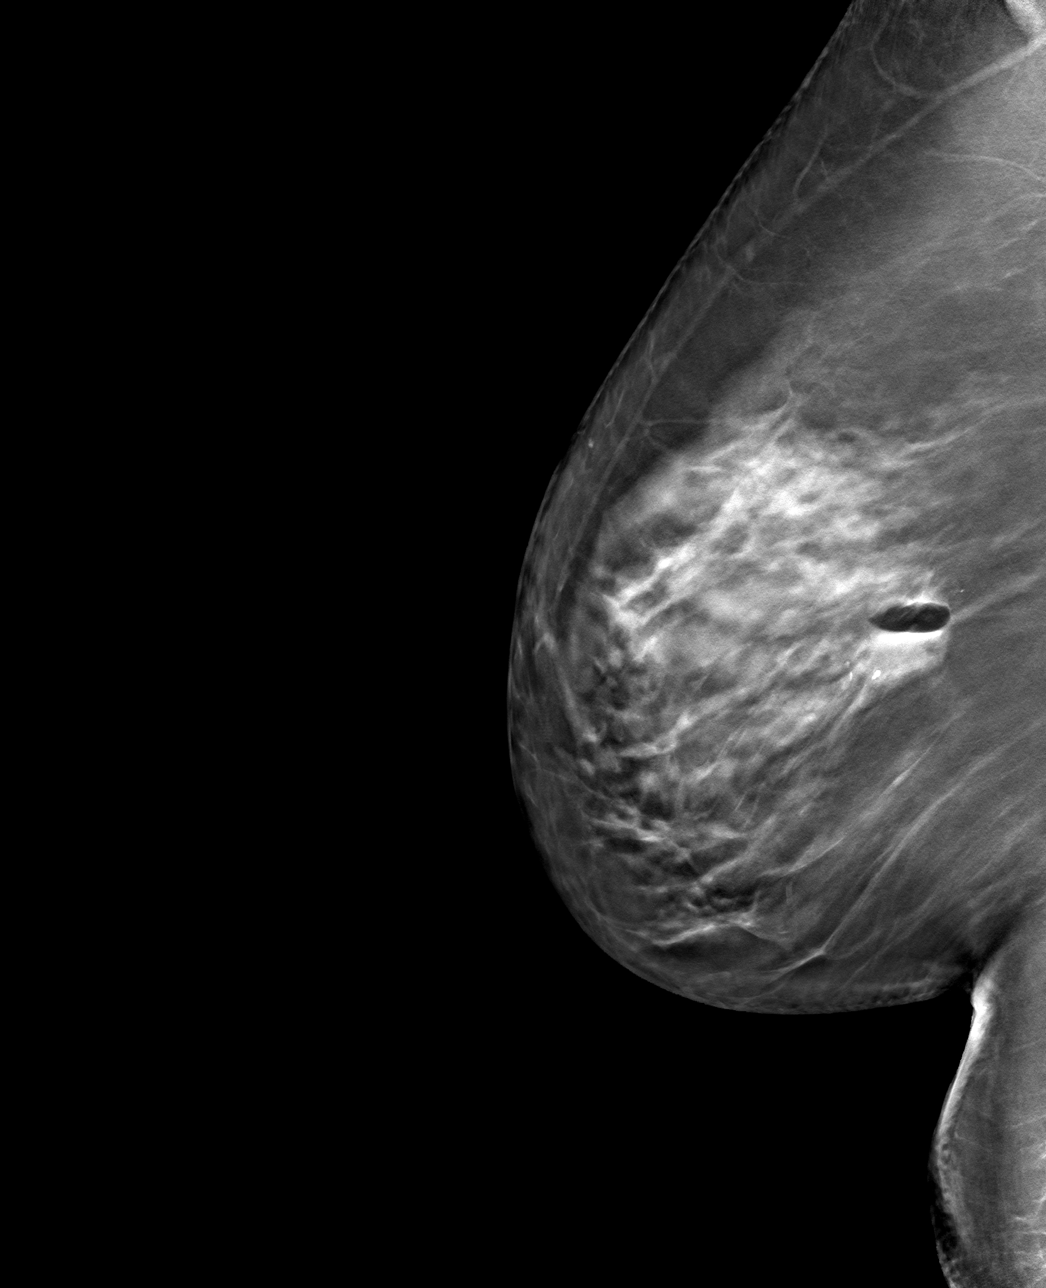

[R CC tomo · tomo slice 49/97.0]
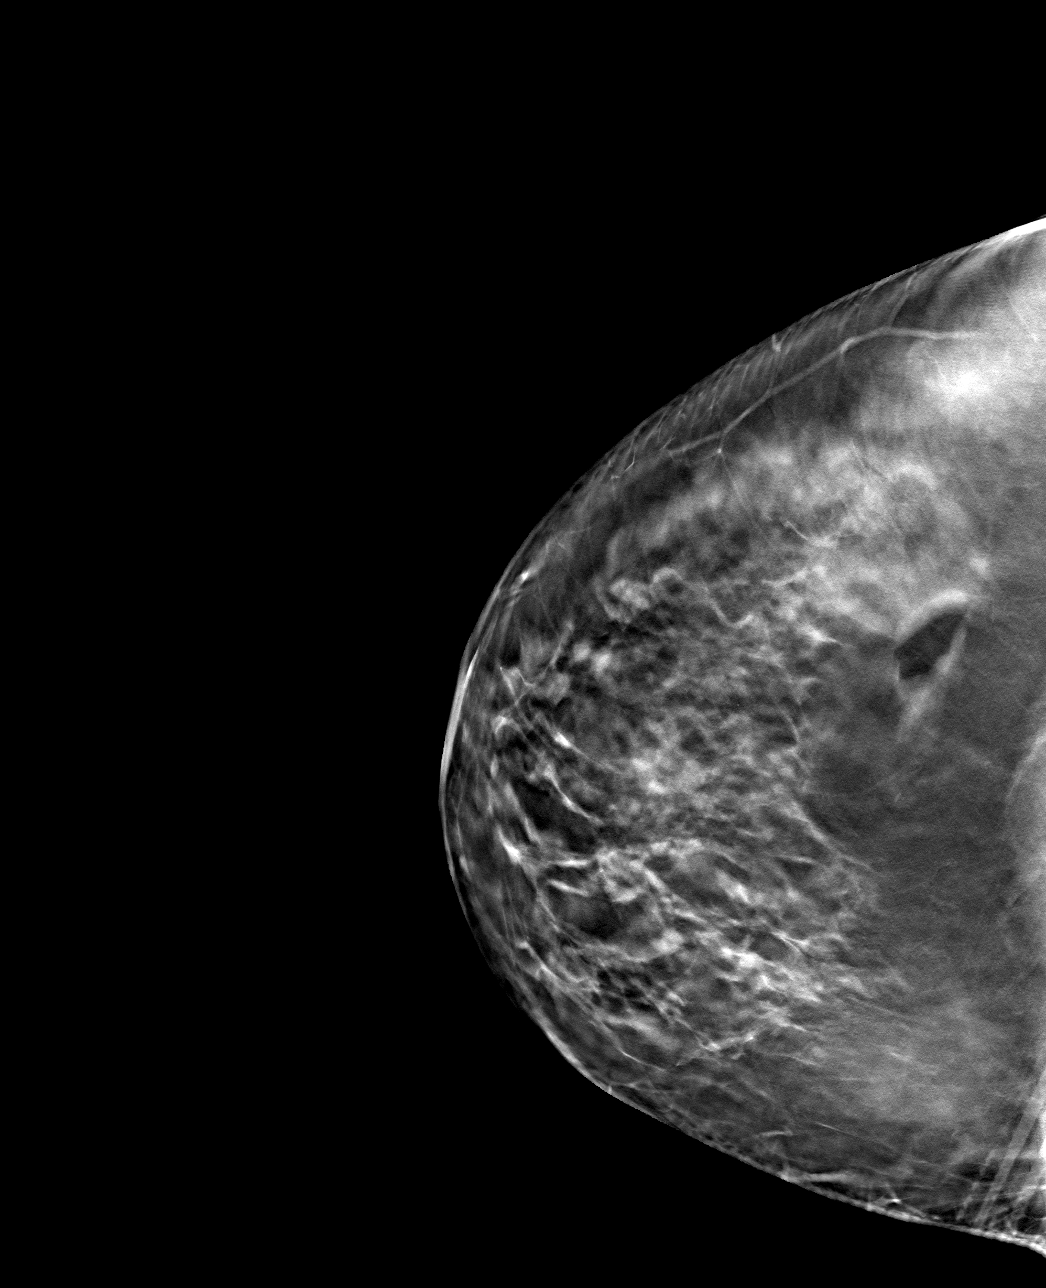

[4 of 12 positions shown; findings below may reference images not displayed]

FINDINGS: Mammographic images were obtained following stereotactic guided
biopsy of right breast calcifications. The biopsy marking clip is in
expected position at the site of biopsy.
IMPRESSION: Appropriate positioning of the coil shaped biopsy marking clip at
the site of biopsy in the lateral, slightly lower outer quadrant
aspect the right breast adjacent to residual calcifications.

Final Assessment: Post Procedure Mammograms for Marker Placement

## 2021-06-22 MED ORDER — LISDEXAMFETAMINE DIMESYLATE 50 MG PO CAPS: 50.0000 mg | ORAL_CAPSULE | Freq: Every day | ORAL | 0 refills | Status: DC

## 2021-06-22 NOTE — Telephone Encounter (Signed)
Last filled 05/20/2021.Controlled substance database (PDMP) reviewed. No concerns appreciated.  Refill ordered. ?

## 2021-06-22 NOTE — Telephone Encounter (Signed)
Patient is requesting a refill of the following medications: ?Requested Prescriptions  ? ?Pending Prescriptions Disp Refills  ? lisdexamfetamine (VYVANSE) 50 MG capsule 30 capsule 0  ?  Sig: Take 1 capsule (50 mg total) by mouth daily.  ? ? ?Date of patient request: 06/21/21 ?Last office visit: 03/19/21 ?Date of last refill: 05/20/21 ?Last refill amount: 30 ? ? ?

## 2021-06-24 ENCOUNTER — Other Ambulatory Visit: Payer: Self-pay | Admitting: Family Medicine

## 2021-07-06 ENCOUNTER — Ambulatory Visit: Payer: BC Managed Care – PPO | Admitting: Family Medicine

## 2021-07-06 ENCOUNTER — Encounter: Payer: Self-pay | Admitting: Family Medicine

## 2021-07-06 VITALS — BP 134/80 | HR 92 | Temp 98.3°F | Resp 17 | Ht 66.0 in | Wt 188.0 lb

## 2021-07-06 DIAGNOSIS — F900 Attention-deficit hyperactivity disorder, predominantly inattentive type: Secondary | ICD-10-CM

## 2021-07-06 DIAGNOSIS — F5104 Psychophysiologic insomnia: Secondary | ICD-10-CM

## 2021-07-06 DIAGNOSIS — F418 Other specified anxiety disorders: Secondary | ICD-10-CM | POA: Diagnosis not present

## 2021-07-06 DIAGNOSIS — E876 Hypokalemia: Secondary | ICD-10-CM

## 2021-07-06 DIAGNOSIS — I10 Essential (primary) hypertension: Secondary | ICD-10-CM

## 2021-07-06 MED ORDER — CHLORTHALIDONE 25 MG PO TABS: 25.0000 mg | ORAL_TABLET | Freq: Every day | ORAL | 1 refills | Status: DC

## 2021-07-06 MED ORDER — CITALOPRAM HYDROBROMIDE 40 MG PO TABS: 40.0000 mg | ORAL_TABLET | Freq: Every day | ORAL | 1 refills | Status: DC

## 2021-07-06 MED ORDER — BUSPIRONE HCL 10 MG PO TABS: 5.0000 mg | ORAL_TABLET | Freq: Two times a day (BID) | ORAL | 1 refills | Status: DC

## 2021-07-06 NOTE — Patient Instructions (Addendum)
I do recommend following up with therapist initially to discuss your symptoms as some of the focus may be due to anxiety, stress, feeling overwhelmed. We can look at adjusting vyvanse at next visit, but that can also impact sleep.  We also can discuss psychological testing for memory but at this point I think some of your symptoms may be due to the anxiety/stress. ?Try low dose melatonin - 1-'2mg'$  every night to see if that will help sleep. We may need to actually decrease dose of Vyvanse is needed for better sleep. Recheck in 6 weeks.  ?Thanks for coming in today and let me know if there are questions prior to next visit. ? ?

## 2021-07-06 NOTE — Progress Notes (Signed)
? ?Subjective:  ?Patient ID: Catherine Cole, female    DOB: December 02, 1955  Age: 66 y.o. MRN: [id number] ? ?CC:  ?Chief Complaint  ?Patient presents with  ? Depression  ?  Pt here to follow up on Buspar and celexa, notes they are doing okay but having trouble staying focused, she is going through change at work and home   ? ADD  ?  Pt due for refill vyvanse notes may not be working as well as it has previously   ? Hypertension  ?  Due for refill no physical sxs noted   ? ? ?HPI ?Catherine Cole presents for  ? ?Hypertension: ?Chlorthalidone 25 mg daily, potassium 10 mEq daily, losartan 50 mg daily. ?No regular missed doses. No new side effects.  ?Has lymphedema pump which has been helpful.  ?Home readings: none recently - has meter.  ?BP Readings from Last 3 Encounters:  ?07/06/21 134/80  ?03/23/21 (!) 146/90  ?03/19/21 132/80  ? ?Lab Results  ?Component Value Date  ? CREATININE 0.83 03/23/2021  ? ?Attention deficit disorder ?Vyvanse 71m QD.  ?Some life stressors currently, some difficulty with focus.  See below.  Denies any heart palpitations, weight loss, appetite suppression. Forgetting at times. Getting better.  ? ?Depression with anxiety: ?Situational stressors were discussed at her last visit in December.  Previously had increased her buspirone and continue Celexa same dose.  She has had some sluggish feelings in the past or less alert if taking higher dose of buspirone.  Melatonin recommended.  ?Remains on 10 mg in the morning, 5 mg at night.  ?Feeling overwhelmed. Still in flux about moving.  ?Prior therapist - last met with her in September, too many things going on. Considering following up with therapist.  ?Feeling down, stressed. No SI. Hard on self about things not getting done.  ?Still some difficulty wit sleep onset. No recent melatonin, took for a few days. At 1.5-284m Unsure if helpful.  ? ?[facility] 07/06/2021 03/19/2021 01/29/2021 01/16/2021 10/16/2020  ?Decreased Interest 0  1 1 0 2  ?Down, Depressed, Hopeless _0 0 3  ?PHQ - 2 Score _1 0 5  ?Altered sleeping _2 - 0  ?Tired, decreased energy _3 - 0  ?Change in appetite 1 1 0 - 0  ?Feeling bad or failure about yourself  0 0 0 - 0  ?Trouble concentrating _4 - 0  ?Moving slowly or fidgety/restless 0 0 0 - 0  ?Suicidal thoughts 0 0 0 - 0  ?PHQ-9 Score _5 - 5  ?Difficult doing work/chores - Somewhat difficult Not difficult at all - Somewhat difficult  ?Some recent data might be hidden  ? ? ? ?History ?Patient Active Problem List  ? Diagnosis Date Noted  ? Localized osteoporosis without current pathological fracture 03/23/2021  ? Genetic testing 08/03/2019  ? Breast cancer, right ([place]/10/2019  ? 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 Family history of ovarian cancer   ? Family history of breast cancer   ? Family history of colon cancer   ? Family history of kidney cancer   ? Family history of melanoma   ? Vitamin D deficiency 12/12/2018  ? Essential hypertension, benign 10/15/2015  ? Kidney stone 05/02/2015  ? Palpitations 02/08/2015  ? Atypical chest pain 02/08/2015  ? Exertional dyspnea 02/08/2015  ? Lower extremity edema 02/08/2015  ? Mixed dyslipidemia 03/26/2014  ? Irritable bowel  syndrome with diarrhea 04/03/2013  ? Depression with anxiety 04/26/2012  ? ADHD (attention deficit hyperactivity disorder) 04/26/2012  ? ?Past Medical History:  ?Diagnosis Date  ? Adult ADHD   ? Anemia   ? Anxiety   ? Cancer [facility])   ? Phreesia 05/13/2020  ? Complication of anesthesia   ? Depression   ? Depression   ? Phreesia 05/13/2020  ? Family history of breast cancer   ? Family history of colon cancer   ? Family history of kidney cancer   ? Family history of melanoma   ? Family history of ovarian cancer   ? Hypertension   ? IBS (irritable bowel syndrome)   ? PONV (postoperative nausea and vomiting)   ? ?Past Surgical History:  ?Procedure Laterality Date  ? ABDOMINAL HYSTERECTOMY N/A   ? Phreesia 05/13/2020  ?  APPENDECTOMY    ? 12 yrs  ? BREAST SURGERY N/A   ? Phreesia 05/13/2020  ? CHOLECYSTECTOMY  1995  ? CHOLESTEATOMA EXCISION    ? 13 and 22 yrs  ? FRACTURE SURGERY    ? [place]  ? LESION DESTRUCTION N/A 12/03/2015  ? 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 MASTECTOMY W/ SENTINEL NODE BIOPSY Bilateral 07/25/2019  ? Procedure: BILATERAL MASTECTOMY WITH RIGHT SENTINEL LYMPH NODE BIOPSY, LEFT RISK REDUCING MASTECTOMY;  Surgeon: [name], MD;  Location: [place];  Service: General;  Laterality: Bilateral;  PEC BLOCK  ? TONSILLECTOMY    ? 15 yrs  ? ?Allergies  ?Allergen Reactions  ? Latex Hives  ? Demerol Nausea And Vomiting  ? Codeine Nausea And Vomiting  ? Erythromycin Itching and Rash  ? Neosporin [Neomycin-Polymyxin B Gu] Itching and Rash  ? ?Prior to Admission medications   ?Medication Sig Start Date End Date Taking? Authorizing Provider  ?busPIRone (BUSPAR) 10 MG tablet Take 0.5-1 tablets (5-10 mg total) by mouth 2 (two) times daily. 03/19/21  Yes [name], MD  ?chlorthalidone (HYGROTON) 25 MG tablet TAKE 1 TABLET (25 MG TOTAL) BY MOUTH DAILY. 05/14/21  Yes [name], MD  ?citalopram (CELEXA) 40 MG tablet TAKE 1 TABLET BY MOUTH EVERY DAY 02/02/21  Yes [name], MD  ?Incontinence Supply Disposable (DEPEND EASY FIT UNDERGARMENTS) MISC Use daily as directed. Change several times daily as needed. 06/29/18  Yes [name], [name], MD  ?lisdexamfetamine (VYVANSE) 50 MG capsule Take 1 capsule (50 mg total) by mouth daily. Ok to fill in 60 days 12/12/20  Yes [name], MD  ?lisdexamfetamine (VYVANSE) 50 MG capsule Take 1 capsule (50 mg total) by mouth daily. 06/22/21  Yes [name], MD  ?losartan (COZAAR) 50 MG tablet TAKE 1 TABLET BY MOUTH EVERY DAY 06/24/21  Yes [name], MD  ?Multiple Vitamin  (MULTIVITAMIN WITH MINERALS) TABS tablet Take 1 tablet by mouth daily.   Yes [provider]  ?potassium chloride (KLOR-CON) 10 MEQ tablet TAKE 2 TABLETS BY MOUTH DAILY 06/15/21  Yes [name], MD  ?valACYclovir (VALTREX) 500 MG tablet Take 2 tablets by mouth at acute onset for outbreak, followed by 1 tablet once a day x 5 days 01/11/20  Yes [name], [name], MD  ?Vitamin D, Ergocalciferol, (DRISDOL) 1.25 MG (50000 UNIT) CAPS capsule Take 1 capsule (50,000 Units total) by mouth every 7 (seven) days. 03/24/21  Yes [name],  [name], MD  ? ?Social History  ? ?Socioeconomic History  ? Marital status: Widowed  ?  Spouse name: Not on file  ? Number of children: Not on file  ? Years of education: Not on file  ? Highest education level: Not on file  ?Occupational History  ? Not on file  ?Tobacco Use  ? Smoking status: Former  ?  Packs/day: 1.00  ?  Years: 7.00  ?  Pack years: 7.00  ?  Types: Cigarettes  ? Smokeless tobacco: Never  ? Tobacco comments:  ?  some day smoker when she smoked  ?Substance and Sexual Activity  ? Alcohol use: No  ? Drug use: No  ? Sexual activity: Never  ?Other Topics Concern  ? Not on file  ?Social History Narrative  ? Not on file  ? ?Social Determinants of Health  ? ?Financial Resource Strain: Not on file  ?Food Insecurity: Not on file  ?Transportation Needs: Not on file  ?Physical Activity: Not on file  ?Stress: Not on file  ?Social Connections: Not on file  ?Intimate Partner Violence: Not on file  ? ? ?Review of Systems  ?Constitutional:  Negative for fatigue and unexpected weight change.  ?Respiratory:  Negative for chest tightness and shortness of breath.   ?Cardiovascular:  Negative for chest pain, palpitations and leg swelling.  ?Gastrointestinal:  Negative for abdominal pain and blood in stool.  ?Neurological:  Negative for dizziness, syncope, light-headedness and headaches.  ? ? ?Objective:  ? ?Vitals:  ? 07/06/21 1411  ?BP: 134/80  ?Pulse: 92  ?Resp: 17   ?Temp: 98.3 ?F (36.8 ?C)  ?TempSrc: Temporal  ?SpO2: 95%  ?Weight: 188 lb (85.3 kg)  ?Height: _0  (1.676 m)  ? ? ?Physical Exam ?Vitals reviewed.  ?Constitutional:   ?   General: She is not in acute distress. ?   App

## 2021-07-21 ENCOUNTER — Other Ambulatory Visit: Payer: Self-pay | Admitting: Family Medicine

## 2021-07-21 DIAGNOSIS — F900 Attention-deficit hyperactivity disorder, predominantly inattentive type: Secondary | ICD-10-CM

## 2021-07-21 NOTE — Telephone Encounter (Signed)
Patient is requesting a refill of the following medications: ?Requested Prescriptions  ? ?Pending Prescriptions Disp Refills  ? lisdexamfetamine (VYVANSE) 50 MG capsule 30 capsule 0  ?  Sig: Take 1 capsule (50 mg total) by mouth daily.  ? ? ?Date of patient request: 07/21/2021 ?Last office visit: 07/06/2021 ?Date of last refill: 06/22/2021 ?Last refill amount: 30 capsules ?Follow up time period per chart: 08/19/2021 ?

## 2021-07-22 MED ORDER — LISDEXAMFETAMINE DIMESYLATE 50 MG PO CAPS: 50.0000 mg | ORAL_CAPSULE | Freq: Every day | ORAL | 0 refills | Status: DC

## 2021-07-22 NOTE — Telephone Encounter (Signed)
Controlled substance database (PDMP) reviewed. No concerns appreciated.  ?Last filled 06/22/2021.  Meds discussed at March 20th visit. ?

## 2021-08-19 ENCOUNTER — Ambulatory Visit: Payer: BC Managed Care – PPO | Admitting: Family Medicine

## 2021-08-27 ENCOUNTER — Other Ambulatory Visit: Payer: Self-pay

## 2021-08-27 ENCOUNTER — Ambulatory Visit: Payer: BC Managed Care – PPO | Admitting: Family Medicine

## 2021-08-27 ENCOUNTER — Encounter: Payer: Self-pay | Admitting: Family Medicine

## 2021-08-27 VITALS — BP 128/74 | HR 82 | Temp 98.4°F | Resp 16 | Ht 66.0 in | Wt 195.8 lb

## 2021-08-27 DIAGNOSIS — R051 Acute cough: Secondary | ICD-10-CM

## 2021-08-27 DIAGNOSIS — F900 Attention-deficit hyperactivity disorder, predominantly inattentive type: Secondary | ICD-10-CM

## 2021-08-27 DIAGNOSIS — J069 Acute upper respiratory infection, unspecified: Secondary | ICD-10-CM

## 2021-08-27 LAB — POC COVID19 BINAXNOW: SARS Coronavirus 2 Ag: NEGATIVE

## 2021-08-27 MED ORDER — LISDEXAMFETAMINE DIMESYLATE 50 MG PO CAPS: 50.0000 mg | ORAL_CAPSULE | Freq: Every day | ORAL | 0 refills | Status: DC

## 2021-08-27 MED ORDER — BENZONATATE 100 MG PO CAPS: 100.0000 mg | ORAL_CAPSULE | Freq: Three times a day (TID) | ORAL | 0 refills | Status: DC | PRN

## 2021-08-27 NOTE — Telephone Encounter (Signed)
Controlled substance database reviewed, last filled 07/22/2021.  Initially planned on chronic med discussion today, but changed to acute visit.  Will refill for additional month. ?

## 2021-08-27 NOTE — Progress Notes (Signed)
? ?Subjective:  ?Patient ID: Zana Biancardi, female    DOB: [birth year]  Age: 66 y.o. MRN: [id number] ? ?CC:  ?Chief Complaint  ?Patient presents with  ? Cough  ?  Pt reports cough, dry throat, started 4 days ago, negative COVID negative yesterday, denies exposure to COVID   ? ? ?HPI ?[name] presents for  ? ?Cough: ?Past 4 days, ?initially with dry, scratchy throat, not painful. No measured fever. Min sneeze, slight nasal congestion.  ?Dry cough at night. Slept ok last night, prior difficulty sleeping with cough. ?More run down with moving recently.  ?negative COVID testing yesterday, no known COVID exposure, but someone helping last week with similar sx's - wearing a mask.  ?Nasal congestion.  ?History of COVID-vaccine in 12/2019, 05/2020 - no booster. No covid infection.  ?Tx: cough drops, rest. Has coricidin - has not taken.  ?No typical seasonal allergies. Some issues with pollen in past causing a cough.  ? ?History ?Patient Active Problem List  ? Diagnosis Date Noted  ? Localized osteoporosis without current pathological fracture 03/23/2021  ? Genetic testing 08/03/2019  ? Breast cancer, right ([place]) 07/25/2019  ? 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 Family history of ovarian cancer   ? Family history of breast cancer   ? Family history of colon cancer   ? Family history of kidney cancer   ? Family history of melanoma   ? Vitamin D deficiency 12/12/2018  ? Essential hypertension, benign 10/15/2015  ? Kidney stone 05/02/2015  ? Palpitations 02/08/2015  ? Atypical chest pain 02/08/2015  ? Exertional dyspnea 02/08/2015  ? Lower extremity edema 02/08/2015  ? Mixed dyslipidemia 03/26/2014  ? 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/03/2013  ? Depression with anxiety 04/26/2012  ? ADHD (attention deficit hyperactivity disorder) 04/26/2012  ? ?Past Medical History:  ?Diagnosis Date  ? Adult ADHD   ? Anemia   ? Anxiety   ? Cancer [facility])   ? Phreesia 05/13/2020  ? Complication  of anesthesia   ? Depression   ? Depression   ? Phreesia 05/13/2020  ? Family history of breast cancer   ? Family history of colon cancer   ? Family history of kidney cancer   ? Family history of melanoma   ? Family history of ovarian cancer   ? Hypertension   ? IBS (irritable bowel syndrome)   ? PONV (postoperative nausea and vomiting)   ? ?Past Surgical History:  ?Procedure Laterality Date  ? ABDOMINAL HYSTERECTOMY N/A   ? Phreesia 05/13/2020  ? APPENDECTOMY    ? 12 yrs  ? BREAST SURGERY N/A   ? Phreesia 05/13/2020  ? CHOLECYSTECTOMY  1995  ? CHOLESTEATOMA EXCISION    ? 13 and 22 yrs  ? FRACTURE SURGERY    ? [place]  ? LESION DESTRUCTION N/A 12/03/2015  ? 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;  Surgeon: [name], DO;  Location: [place];  Service: Plastics;  Laterality: N/A;  ? MASTECTOMY W/ SENTINEL NODE BIOPSY Bilateral 07/25/2019  ? Procedure: BILATERAL MASTECTOMY WITH RIGHT SENTINEL LYMPH NODE BIOPSY, LEFT RISK REDUCING MASTECTOMY;  Surgeon: [name], MD;  Location: [place];  Service: General;  Laterality: Bilateral;  PEC BLOCK  ? TONSILLECTOMY    ? 15 yrs  ? ?Allergies  ?Allergen Reactions  ? Latex Hives  ? Demerol Nausea And Vomiting  ? Codeine Nausea And Vomiting  ? Erythromycin  Itching and Rash  ? Neosporin [Neomycin-Polymyxin B Gu] Itching and Rash  ? ?Prior to Admission medications   ?Medication Sig Start Date End Date Taking? Authorizing Provider  ?busPIRone (BUSPAR) 10 MG tablet Take 0.5-1 tablets (5-10 mg total) by mouth 2 (two) times daily. 07/06/21  Yes [name], MD  ?chlorthalidone (HYGROTON) 25 MG tablet Take 1 tablet (25 mg total) by mouth daily. 07/06/21  Yes [name], MD  ?citalopram (CELEXA) 40 MG tablet Take 1 tablet (40 mg total) by mouth daily. 07/06/21  Yes [name], MD  ?Incontinence Supply Disposable (DEPEND EASY FIT UNDERGARMENTS) MISC Use daily as directed. Change  several times daily as needed. 06/29/18  Yes [name], [name], MD  ?lisdexamfetamine (VYVANSE) 50 MG capsule Take 1 capsule (50 mg total) by mouth daily. 07/22/21  Yes [name], MD  ?losartan (COZAAR) 50 MG tablet TAKE 1 TABLET BY MOUTH EVERY DAY 06/24/21  Yes [name], MD  ?Multiple Vitamin (MULTIVITAMIN WITH MINERALS) TABS tablet Take 1 tablet by mouth daily.   Yes [provider]  ?potassium chloride (KLOR-CON) 10 MEQ tablet TAKE 2 TABLETS BY MOUTH DAILY 06/15/21  Yes [name], MD  ?valACYclovir (VALTREX) 500 MG tablet Take 2 tablets by mouth at acute onset for outbreak, followed by 1 tablet once a day x 5 days 01/11/20  Yes [name], [name], MD  ?Vitamin D, Ergocalciferol, (DRISDOL) 1.25 MG (50000 UNIT) CAPS capsule Take 1 capsule (50,000 Units total) by mouth every 7 (seven) days. 03/24/21  Yes [name], [name], MD  ? ?Social History  ? ?Socioeconomic History  ? Marital status: Widowed  ?  Spouse name: Not on file  ? Number of children: Not on file  ? Years of education: Not on file  ? Highest education level: Not on file  ?Occupational History  ? Not on file  ?Tobacco Use  ? Smoking status: Former  ?  Packs/day: 1.00  ?  Years: 7.00  ?  Pack years: 7.00  ?  Types: Cigarettes  ? Smokeless tobacco: Never  ? Tobacco comments:  ?  some day smoker when she smoked  ?Substance and Sexual Activity  ? Alcohol use: No  ? Drug use: No  ? Sexual activity: Never  ?Other Topics Concern  ? Not on file  ?Social History Narrative  ? Not on file  ? ?Social Determinants of Health  ? ?Financial Resource Strain: Not on file  ?Food Insecurity: Not on file  ?Transportation Needs: Not on file  ?Physical Activity: Not on file  ?Stress: Not on file  ?Social Connections: Not on file  ?Intimate Partner Violence: Not on file  ? ? ?Review of Systems ?Per HPI ? ?Objective:  ? ?Vitals:  ? 08/27/21 1348  ?BP: 128/74  ?Pulse: 82  ?Resp: 16  ?Temp: 98.4 ?F (36.9 ?C)  ?TempSrc: Oral  ?SpO2: 96%   ?Weight: 195 lb 12.8 oz (88.8 kg)  ?Height: '5\' 6"'$  (1.676 m)  ? ? ? ?Physical Exam ?Vitals reviewed.  ?Constitutional:   ?   General: She is not in acute distress. ?   Appearance: She is well-developed.  ?HENT:  ?   Head: Normocephalic and atraumatic.  ?   Right Ear: Hearing, tympanic membrane, ear canal and external ear normal.  ?   Left Ear: Hearing, tympanic membrane, ear canal and external ear normal.  ?   Nose: Nose normal.  ?   Mouth/Throat:  ?   Mouth: Mucous  membranes are moist.  ?   Pharynx: No oropharyngeal exudate or posterior oropharyngeal erythema.  ?Eyes:  ?   Conjunctiva/sclera: Conjunctivae normal.  ?   Pupils: Pupils are equal, round, and reactive to light.  ?Cardiovascular:  ?   Rate and Rhythm: Normal rate and regular rhythm.  ?   Heart sounds: Normal heart sounds. No murmur heard. ?Pulmonary:  ?   Effort: Pulmonary effort is normal. No respiratory distress.  ?   Breath sounds: Normal breath sounds. No wheezing or rhonchi.  ?Skin: ?   General: Skin is warm and dry.  ?   Findings: No rash.  ?Neurological:  ?   Mental Status: She is alert and oriented to person, place, and time.  ?Psychiatric:     ?   Mood and Affect: Mood normal.     ?   Behavior: Behavior normal.  ? ?Results for orders placed or performed in visit on 08/27/21  ?POC COVID-19 BinaxNow  ?Result Value Ref Range  ? SARS Coronavirus 2 Ag Negative Negative  ? ? ? ?Assessment & Plan:  ?Keiara Sneeringer is a 66 y.o. female . ?Acute cough - Plan: benzonatate (TESSALON) 100 MG capsule, POC COVID-19 BinaxNow ? ?Upper respiratory tract infection, unspecified type - Plan: benzonatate (TESSALON) 100 MG capsule, POC COVID-19 BinaxNow ? ?Likely viral syndrome, reassuring exam.  Symptomatic care discussed.  Tessalon Perles as needed for cough, or can use Coricidin.  Fluids, rest, RTC/ER precautions given.  Repeat COVID testing negative. ? ?Meds ordered this encounter  ?Medications  ? benzonatate (TESSALON) 100 MG capsule  ?  Sig: Take 1  capsule (100 mg total) by mouth 3 (three) times daily as needed for cough.  ?  Dispense:  20 capsule  ?  Refill:  0  ? ?Patient Instructions  ?Ok to take corcidin for cough or tessalon perles of needed for cough

## 2021-08-27 NOTE — Telephone Encounter (Signed)
Patient is requesting a refill of the following medications: ?Requested Prescriptions  ? ?Pending Prescriptions Disp Refills  ? lisdexamfetamine (VYVANSE) 50 MG capsule 30 capsule 0  ?  Sig: Take 1 capsule (50 mg total) by mouth daily.  ? ? ?Date of patient request: 08/27/21 ?Last office visit: 07/06/21 ?Date of last refill: 07/22/21 ?Last refill amount: 30 ? ?

## 2021-08-27 NOTE — Telephone Encounter (Signed)
MEDICATION: lisdexamfetamine (VYVANSE) 50 MG capsule ? ?PHARMACY: [id number] [place], [place] - [address]? ?Comments: Patient has 3 days worth left.  ? ?**Let patient know to contact pharmacy at the end of the day to make sure medication is ready. ** ? ?** Please notify patient to allow 48-72 hours to process** ? ?**Encourage patient to contact the pharmacy for refills or [facility]* ? ? ?

## 2021-08-27 NOTE — Patient Instructions (Addendum)
Ok to take corcidin for cough or tessalon perles of needed for cough. Drink fluids, rest. I expect improvement in next few days. Be seen if any worse symptoms. Take care.  ?Return to the clinic or go to the nearest emergency room if any of your symptoms worsen or new symptoms occur. ? ?Upper Respiratory Infection, Adult ?An upper respiratory infection (URI) is a common viral infection of the nose, throat, and upper air passages that lead to the lungs. The most common type of URI is the common cold. URIs usually get better on their own, without medical treatment. ?What are the causes? ?A URI is caused by a virus. You may catch a virus by: ?Breathing in droplets from an infected person's cough or sneeze. ?Touching something that has been exposed to the virus (is contaminated) and then touching your mouth, nose, or eyes. ?What increases the risk? ?You are more likely to get a URI if: ?You are very young or very old. ?You have close contact with others, such as at work, school, or a health care facility. ?You smoke. ?You have long-term (chronic) heart or lung disease. ?You have a weakened disease-fighting system (immune system). ?You have nasal allergies or asthma. ?You are experiencing a lot of stress. ?You have poor nutrition. ?What are the signs or symptoms? ?A URI usually involves some of the following symptoms: ?Runny or stuffy (congested) nose. ?Cough. ?Sneezing. ?Sore throat. ?Headache. ?Fatigue. ?Fever. ?Loss of appetite. ?Pain in your forehead, behind your eyes, and over your cheekbones (sinus pain). ?Muscle aches. ?Redness or irritation of the eyes. ?Pressure in the ears or face. ?How is this diagnosed? ?This condition may be diagnosed based on your medical history and symptoms, and a physical exam. Your health care provider may use a swab to take a mucus sample from your nose (nasal swab). This sample can be tested to determine what virus is causing the illness. ?How is this treated? ?URIs usually get better  on their own within 7-10 days. Medicines cannot cure URIs, but your health care provider may recommend certain medicines to help relieve symptoms, such as: ?Over-the-counter cold medicines. ?Cough suppressants. Coughing is a type of defense against infection that helps to clear the respiratory system, so take these medicines only as recommended by your health care provider. ?Fever-reducing medicines. ?Follow these instructions at home: ?Activity ?Rest as needed. ?If you have a fever, stay home from work or school until your fever is gone or until your health care provider says your URI cannot spread to other people (is no longer contagious). Your health care provider may have you wear a face mask to prevent your infection from spreading. ?Relieving symptoms ?Gargle with a mixture of salt and water 3-4 times a day or as needed. To make salt water, completely dissolve ?-1 tsp (3-6 g) of salt in 1 cup (237 mL) of warm water. ?Use a cool-mist humidifier to add moisture to the air. This can help you breathe more easily. ?Eating and drinking ? ?Drink enough fluid to keep your urine pale yellow. ?Eat soups and other clear broths. ?General instructions ? ?Take over-the-counter and prescription medicines only as told by your health care provider. These include cold medicines, fever reducers, and cough suppressants. ?Do not use any products that contain nicotine or tobacco. These products include cigarettes, chewing tobacco, and vaping devices, such as e-cigarettes. If you need help quitting, ask your health care provider. ?Stay away from secondhand smoke. ?Stay up to date on all immunizations, including the yearly (annual)  flu vaccine. ?Keep all follow-up visits. This is important. ?How to prevent the spread of infection to others ?URIs can be contagious. To prevent the infection from spreading: ?Wash your hands with soap and water for at least 20 seconds. If soap and water are not available, use hand sanitizer. ?Avoid  touching your mouth, face, eyes, or nose. ?Cough or sneeze into a tissue or your sleeve or elbow instead of into your hand or into the air. ? ?Contact a health care provider if: ?You are getting worse instead of better. ?You have a fever or chills. ?Your mucus is brown or red. ?You have yellow or brown discharge coming from your nose. ?You have pain in your face, especially when you bend forward. ?You have swollen neck glands. ?You have pain while swallowing. ?You have white areas in the back of your throat. ?Get help right away if: ?You have shortness of breath that gets worse. ?You have severe or persistent: ?Headache. ?Ear pain. ?Sinus pain. ?Chest pain. ?You have chronic lung disease along with any of the following: ?Making high-pitched whistling sounds when you breathe, most often when you breathe out (wheezing). ?Prolonged cough (more than 14 days). ?Coughing up blood. ?A change in your usual mucus. ?You have a stiff neck. ?You have changes in your: ?Vision. ?Hearing. ?Thinking. ?Mood. ?These symptoms may be an emergency. Get help right away. Call 911. ?Do not wait to see if the symptoms will go away. ?Do not drive yourself to the hospital. ?Summary ?An upper respiratory infection (URI) is a common infection of the nose, throat, and upper air passages that lead to the lungs. ?A URI is caused by a virus. ?URIs usually get better on their own within 7-10 days. ?Medicines cannot cure URIs, but your health care provider may recommend certain medicines to help relieve symptoms. ?This information is not intended to replace advice given to you by your health care provider. Make sure you discuss any questions you have with your health care provider. ?Document Revised: 11/05/2020 Document Reviewed: 11/05/2020 ?Elsevier Patient Education ? [place]. ? ? ? ?

## 2021-09-09 ENCOUNTER — Ambulatory Visit: Payer: BC Managed Care – PPO | Admitting: Family Medicine

## 2021-09-09 VITALS — BP 136/68 | HR 91 | Temp 98.0°F | Resp 16 | Ht 66.0 in | Wt 195.6 lb

## 2021-09-09 DIAGNOSIS — F900 Attention-deficit hyperactivity disorder, predominantly inattentive type: Secondary | ICD-10-CM

## 2021-09-09 DIAGNOSIS — R1110 Vomiting, unspecified: Secondary | ICD-10-CM

## 2021-09-09 DIAGNOSIS — F418 Other specified anxiety disorders: Secondary | ICD-10-CM | POA: Diagnosis not present

## 2021-09-09 DIAGNOSIS — I890 Lymphedema, not elsewhere classified: Secondary | ICD-10-CM | POA: Diagnosis not present

## 2021-09-09 DIAGNOSIS — R600 Localized edema: Secondary | ICD-10-CM | POA: Diagnosis not present

## 2021-09-09 NOTE — Progress Notes (Unsigned)
Subjective:  Patient ID: Catherine Cole, female    DOB: [birth year]  Age: 66 y.o. MRN: [id number]  CC:  Chief Complaint  Patient presents with   Edema    Pt reports some swelling in her ankles and her arm. 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  Left arm more that right and bilateral ankles more swollen. No dyspnea. No chest pain. Indigestion once.  Treated May 11 for suspected viral syndrome with cough, URI symptoms at that time. Prior history of pedal edema, spider veins, noted in problem list from Dr. Ellyn Hack in 2016.  Support stockings recommended at that time History of lymphedema bilateral upper extremities, lymphedema pump discussed at her March visit.  On chlorthalidone, potassium, losartan for hypertension. Not able to use pump in past 10 days d/t other obligations. Supposed to use daily.  Unable to use sleep number bed - will have tonight.   Attention deficit disorder Discussed at March visit.  Vyvanse 50 mg daily.  Still in flux about moving at her March visit and was feeling overwhelmed.  Concern of focus at that time possibly related to anxiety and stress versus ADD.  Remain on same dose of Vyvanse, recommended reestablishing with counseling Anxious with move, flair of IBS. Dry heave at times when getting too hot. No abd pain, no fever. No vomitus. Called therapist since last visit - 3 visits. 1-2 visits per week, last week. Will not be able to see for next 1.5 weeks as out of town.  Taking buspar '20mg'$  in am only. Doubling up on dose in am due to anxiety during the day - feels like that is helpful. Making list in the evening. Less anxious in evening. Dry heaving last week, once today.  Not daily. Notes more if overheated or more anxious. Tickle in throat, then cough to dry heaving.  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 Supposed to be out this week. Feels like she can pace better to be out this Monday end of day.  Sleeping ok.  Declines med changes or other meds for now. Controlled substance database reviewed.  Last Vyvanse prescription filled on 08/27/2021.  #30. 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   Family history of ovarian cancer    Family history of breast cancer    Family history of colon cancer    Family history of kidney cancer    Family history of melanoma    Vitamin D deficiency 12/12/2018   Essential hypertension, benign 10/15/2015   Kidney stone 05/02/2015   Palpitations 02/08/2015   Atypical chest pain 02/08/2015   Exertional dyspnea 02/08/2015   Lower extremity edema 02/08/2015   Mixed dyslipidemia 03/26/2014   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/03/2013   Depression with anxiety 04/26/2012   ADHD (attention deficit hyperactivity disorder) 04/26/2012   Past Medical History:  Diagnosis Date   Adult ADHD    Anemia    Anxiety  Cancer ([place])    Phreesia 44/06/4740   Complication of anesthesia    Depression    Depression    Phreesia 05/13/2020   Family history of breast cancer    Family history of colon cancer    Family history of kidney cancer    Family history of melanoma    Family history of ovarian cancer    Hypertension    IBS (irritable bowel syndrome)    PONV (postoperative nausea and vomiting)    Past Surgical History:  Procedure Laterality Date   ABDOMINAL HYSTERECTOMY N/A    Phreesia 05/13/2020   APPENDECTOMY     12 yrs   BREAST SURGERY N/A    Phreesia 05/13/2020    CHOLECYSTECTOMY  1995   CHOLESTEATOMA EXCISION     13 and 22 yrs   FRACTURE SURGERY     KIDNEY STONE SURGERY  1995   LESION DESTRUCTION N/A 12/03/2015   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;  Surgeon: [name], DO;  Location: [facility];  Service: Plastics;  Laterality: N/A;   MASTECTOMY W/ SENTINEL NODE BIOPSY Bilateral 07/25/2019   Procedure: BILATERAL MASTECTOMY WITH RIGHT SENTINEL LYMPH NODE BIOPSY, LEFT RISK REDUCING MASTECTOMY;  Surgeon: [name], MD;  Location: [facility];  Service: General;  Laterality: Bilateral;  PEC BLOCK   TONSILLECTOMY     15 yrs   Allergies  Allergen Reactions   Latex Hives   Demerol Nausea And Vomiting   Codeine Nausea And Vomiting   Erythromycin Itching and Rash   Neosporin [Neomycin-Polymyxin B Gu] Itching and Rash   Prior to Admission medications   Medication Sig Start Date End Date Taking? Authorizing Provider  benzonatate (TESSALON) 100 MG capsule Take 1 capsule (100 mg total) by mouth 3 (three) times daily as needed for cough. 08/27/21   [name], MD  busPIRone (BUSPAR) 10 MG tablet Take 0.5-1 tablets (5-10 mg total) by mouth 2 (two) times daily. 07/06/21   [name], MD  chlorthalidone (HYGROTON) 25 MG tablet Take 1 tablet (25 mg total) by mouth daily. 07/06/21   [name], MD  citalopram (CELEXA) 40 MG tablet Take 1 tablet (40 mg total) by mouth daily. 07/06/21   [name], MD  Incontinence Supply Disposable (DEPEND EASY FIT UNDERGARMENTS) MISC Use daily as directed. Change several times daily as needed. 06/29/18   [name], [name], MD  lisdexamfetamine (VYVANSE) 50 MG capsule Take 1 capsule (50 mg total) by mouth daily. 08/27/21   [name], MD  losartan (COZAAR) 50 MG tablet TAKE 1 TABLET BY MOUTH EVERY DAY 06/24/21   [name], MD  Multiple Vitamin (MULTIVITAMIN WITH MINERALS) TABS tablet Take 1 tablet by mouth daily.     [provider]  potassium chloride (KLOR-CON) 10 MEQ tablet TAKE 2 TABLETS BY MOUTH DAILY 06/15/21   [name], MD  valACYclovir (VALTREX) 500 MG tablet Take 2 tablets by mouth at acute onset for outbreak, followed by 1 tablet once a day x 5 days 01/11/20   [name], [name], MD  Vitamin D, Ergocalciferol, (DRISDOL) 1.25 MG (50000 UNIT) CAPS capsule Take 1 capsule (50,000 Units total) by mouth every 7 (seven) days. 03/24/21   [name], [name], MD   Social History   Socioeconomic History   Marital status: Widowed    Spouse name: Not on file   Number of children: Not on file   Years of education: Not  on file   Highest education level: Not on file  Occupational History   Not on file  Tobacco Use   Smoking status: Former    Packs/day: 1.00    Years: 7.00    Pack years: 7.00    Types: Cigarettes   Smokeless tobacco: Never   Tobacco comments:    some day smoker when she smoked  Substance and Sexual Activity   Alcohol use: No   Drug use: No   Sexual activity: Never  Other Topics Concern   Not on file  Social History Narrative   Not on file   Social Determinants of Health   Financial Resource Strain: Not on file  Food Insecurity: Not on file  Transportation Needs: Not on file  Physical Activity: Not on file  Stress: Not on file  Social Connections: Not on file  Intimate Partner Violence: Not on file    Review of Systems Per HPI  Objective:   Vitals:   09/09/21 1345  BP: 136/68  Pulse: 91  Resp: 16  Temp: 98 F (36.7 C)  TempSrc: Temporal  SpO2: 95%  Weight: 195 lb 9.6 oz (88.7 kg)  Height: '5\' 6"'$  (1.676 m)     Physical Exam Vitals reviewed.  Constitutional:      Appearance: Normal appearance. She is well-developed.  HENT:     Head: Normocephalic and atraumatic.  Eyes:     Conjunctiva/sclera: Conjunctivae normal.     Pupils: Pupils are equal, round, and reactive to light.  Neck:     Vascular: No carotid bruit.   Cardiovascular:     Rate and Rhythm: Normal rate and regular rhythm.     Heart sounds: Normal heart sounds.    No gallop.  Pulmonary:     Effort: Pulmonary effort is normal. No respiratory distress.     Breath sounds: Normal breath sounds. No stridor. No wheezing, rhonchi or rales.  Abdominal:     Palpations: Abdomen is soft. There is no pulsatile mass.     Tenderness: There is no abdominal tenderness.  Musculoskeletal:     Right lower leg: Edema (Nonpitting at ankles.) present.     Left lower leg: Edema present.  Skin:    General: Skin is warm and dry.     Comments: Lymphedema left greater than right upper arm, wrists, hands without focal swelling.  Neurological:     Mental Status: She is alert and oriented to person, place, and time.  Psychiatric:        Mood and Affect: Mood is anxious.        Behavior: Behavior normal.       Assessment & Plan:  Catherine Cole is a 66 y.o. female . Pedal edema  Attention deficit hyperactivity disorder (ADHD), predominantly inattentive type  Lymphedema  Depression with anxiety  Situational anxiety  Dry heaves  Pedal edema, lymphedema possibly worsened with increased activity, and overheated.  Also off typical treatment for lymphedema.  Note provided for current landlord, to allow more time for moving, which should allow some more break time, time to remain cool and elevate legs as needed.  Also should restart lymphedema treatment as well as return to her previous bed with leg elevation should help.  Option of compression stockings if persistent pedal edema.  RTC precautions.  Situation anxiety, depression with anxiety, ADD  -Worsened with adjustment to change in living situation as above.  Provided note to allow few additional days for that transition.  Additional medications discussed, option of  BuSpar dosing at night discussed, okay to remain at 20 mg in a.m. if that has been effective.  No change in ED treatment at this time.   Continue counseling.  Handout given on managing stress/anxiety, with RTC precautions.  Advised to let me know if further assistance needed. No orders of the defined types were placed in this encounter.  Patient Instructions  See info below on managing anxiety/stress. Keep follow up with therapist. Can take 5-'10mg'$  buspar at night if needed, ok to remain on '20mg'$  dose in am for now.  Restart lymphedema pump treatment. Try to keep cool with fans, drinking cool fluids, take breaks with work.  Elevate legs and returning to usual bed should help with swelling in legs Note provided for landlord - take some breaks at times to elevate legs.  Compression stockings are also an option if the swelling persists.  If leg swelling not improved by next week (or worse sooner) -please return for recheck.  Return to the clinic or go to the nearest emergency room if any of your symptoms worsen or new symptoms occur.    Managing Anxiety, Adult After being diagnosed with anxiety, you may be relieved to know why you have felt or behaved a certain way. You may also feel overwhelmed about the treatment ahead and what it will mean for your life. With care and support, you can manage this condition. How to manage lifestyle changes Managing stress and anxiety  Stress is your body's reaction to life changes and events, both good and bad. Most stress will last just a few hours, but stress can be ongoing and can lead to more than just stress. Although stress can play a major role in anxiety, it is not the same as anxiety. Stress is usually caused by something external, such as a deadline, test, or competition. Stress normally passes after the triggering event has ended.  Anxiety is caused by something internal, such as imagining a terrible outcome or worrying that something will go wrong that will devastate you. Anxiety often does not go away even after the triggering event is over, and it can become long-term (chronic) worry.  It is important to understand the differences between stress and anxiety and to manage your stress effectively so that it does not lead to an anxious response. Talk with your health care provider or a counselor to learn more about reducing anxiety and stress. He or she may suggest tension reduction techniques, such as: Music therapy. Spend time creating or listening to music that you enjoy and that inspires you. Mindfulness-based meditation. Practice being aware of your normal breaths while not trying to control your breathing. It can be done while sitting or walking. Centering prayer. This involves focusing on a word, phrase, or sacred image that means something to you and brings you peace. Deep breathing. To do this, expand your stomach and inhale slowly through your nose. Hold your breath for 3-5 seconds. Then exhale slowly, letting your stomach muscles relax. Self-talk. Learn to notice and identify thought patterns that lead to anxiety reactions and change those patterns to thoughts that feel peaceful. Muscle relaxation. Taking time to tense muscles and then relax them. Choose a tension reduction technique that fits your lifestyle and personality. These techniques take time and practice. Set aside 5-15 minutes a day to do them. Therapists can offer counseling and training in these techniques. The training to help with anxiety may be covered by some insurance plans. Other things you can do to manage  stress and anxiety include: Keeping a stress diary. This can help you learn what triggers your reaction and then learn ways to manage your response. Thinking about how you react to certain situations. You may not be able to control everything, but you can control your response. Making time for activities that help you relax and not feeling guilty about spending your time in this way. Doing visual imagery. This involves imagining or creating mental pictures to help you relax. Practicing yoga. Through yoga  poses, you can lower tension and promote relaxation.  Medicines Medicines can help ease symptoms. Medicines for anxiety include: Antidepressant medicines. These are usually prescribed for long-term daily control. Anti-anxiety medicines. These may be added in severe cases, especially when panic attacks occur. Medicines will be prescribed by a health care provider. When used together, medicines, psychotherapy, and tension reduction techniques may be the most effective treatment. Relationships Relationships can play a big part in helping you recover. Try to spend more time connecting with trusted friends and family members. Consider going to couples counseling if you have a partner, taking family education classes, or going to family therapy. Therapy can help you and others better understand your condition. How to recognize changes in your anxiety Everyone responds differently to treatment for anxiety. Recovery from anxiety happens when symptoms decrease and stop interfering with your daily activities at home or work. This may mean that you will start to: Have better concentration and focus. Worry will interfere less in your daily thinking. Sleep better. Be less irritable. Have more energy. Have improved memory. It is also important to recognize when your condition is getting worse. Contact your health care provider if your symptoms interfere with home or work and you feel like your condition is not improving. Follow these instructions at home: Activity Exercise. Adults should do the following: Exercise for at least 150 minutes each week. The exercise should increase your heart rate and make you sweat (moderate-intensity exercise). Strengthening exercises at least twice a week. Get the right amount and quality of sleep. Most adults need 7-9 hours of sleep each night. Lifestyle  Eat a healthy diet that includes plenty of vegetables, fruits, whole grains, low-fat dairy products, and lean  protein. Do not eat a lot of foods that are high in fats, added sugars, or salt (sodium). Make choices that simplify your life. Do not use any products that contain nicotine or tobacco. These products include cigarettes, chewing tobacco, and vaping devices, such as e-cigarettes. If you need help quitting, ask your health care provider. Avoid caffeine, alcohol, and certain over-the-counter cold medicines. These may make you feel worse. Ask your pharmacist which medicines to avoid. General instructions Take over-the-counter and prescription medicines only as told by your health care provider. Keep all follow-up visits. This is important. Where to find support You can get help and support from these sources: Self-help groups. Online and [employer]. A trusted spiritual leader. Couples counseling. Family education classes. Family therapy. Where to find more information You may find that joining a support group helps you deal with your anxiety. The following sources can help you locate counselors or support groups near you: [place]: www.mentalhealthamerica.net Anxiety and Depression Association of [place] (ADAA): https://www.clark.net/ National Alliance on Mental Illness (NAMI): www.nami.org Contact a health care provider if: You have a hard time staying focused or finishing daily tasks. You spend many hours a day feeling worried about everyday life. You become exhausted by worry. You start to have headaches or frequently feel  tense. You develop chronic nausea or diarrhea. Get help right away if: You have a racing heart and shortness of breath. You have thoughts of hurting yourself or others. If you ever feel like you may hurt yourself or others, or have thoughts about taking your own life, get help right away. Go to your nearest emergency department or: Call your local emergency services (911 in the [place] Call a suicide crisis helpline, such as the [place] at (671)627-7391 or 988 in the [place]. This is open 24 hours a day in the [place] Text the Crisis Text Line at (262) 315-3873 (in the [place].). Summary Taking steps to learn and use tension reduction techniques can help calm you and help prevent triggering an anxiety reaction. When used together, medicines, psychotherapy, and tension reduction techniques may be the most effective treatment. Family, friends, and partners can play a big part in supporting you. This information is not intended to replace advice given to you by your health care provider. Make sure you discuss any questions you have with your health care provider. Document Revised: 10/29/2020 Document Reviewed: 07/27/2020 Elsevier Patient Education  [place],   Merri Ray, MD [place], [facility] 09/09/21 1:48 PM

## 2021-09-09 NOTE — Patient Instructions (Addendum)
See info below on managing anxiety/stress. Keep follow up with therapist. Can take 5-'10mg'$  buspar at night if needed, ok to remain on '20mg'$  dose in am for now.  Restart lymphedema pump treatment. Try to keep cool with fans, drinking cool fluids, take breaks with work.  Elevate legs and returning to usual bed should help with swelling in legs Note provided for landlord - take some breaks at times to elevate legs.  Compression stockings are also an option if the swelling persists.  If leg swelling not improved by next week (or worse sooner) -please return for recheck.  Return to the clinic or go to the nearest emergency room if any of your symptoms worsen or new symptoms occur.    Managing Anxiety, Adult After being diagnosed with anxiety, you may be relieved to know why you have felt or behaved a certain way. You may also feel overwhelmed about the treatment ahead and what it will mean for your life. With care and support, you can manage this condition. How to manage lifestyle changes Managing stress and anxiety  Stress is your body's reaction to life changes and events, both good and bad. Most stress will last just a few hours, but stress can be ongoing and can lead to more than just stress. Although stress can play a major role in anxiety, it is not the same as anxiety. Stress is usually caused by something external, such as a deadline, test, or competition. Stress normally passes after the triggering event has ended.  Anxiety is caused by something internal, such as imagining a terrible outcome or worrying that something will go wrong that will devastate you. Anxiety often does not go away even after the triggering event is over, and it can become long-term (chronic) worry. It is important to understand the differences between stress and anxiety and to manage your stress effectively so that it does not lead to an anxious response. Talk with your health care provider or a counselor to learn more about  reducing anxiety and stress. He or she may suggest tension reduction techniques, such as: Music therapy. Spend time creating or listening to music that you enjoy and that inspires you. Mindfulness-based meditation. Practice being aware of your normal breaths while not trying to control your breathing. It can be done while sitting or walking. Centering prayer. This involves focusing on a word, phrase, or sacred image that means something to you and brings you peace. Deep breathing. To do this, expand your stomach and inhale slowly through your nose. Hold your breath for 3-5 seconds. Then exhale slowly, letting your stomach muscles relax. Self-talk. Learn to notice and identify thought patterns that lead to anxiety reactions and change those patterns to thoughts that feel peaceful. Muscle relaxation. Taking time to tense muscles and then relax them. Choose a tension reduction technique that fits your lifestyle and personality. These techniques take time and practice. Set aside 5-15 minutes a day to do them. Therapists can offer counseling and training in these techniques. The training to help with anxiety may be covered by some insurance plans. Other things you can do to manage stress and anxiety include: Keeping a stress diary. This can help you learn what triggers your reaction and then learn ways to manage your response. Thinking about how you react to certain situations. You may not be able to control everything, but you can control your response. Making time for activities that help you relax and not feeling guilty about spending your time in this  way. Doing visual imagery. This involves imagining or creating mental pictures to help you relax. Practicing yoga. Through yoga poses, you can lower tension and promote relaxation.  Medicines Medicines can help ease symptoms. Medicines for anxiety include: Antidepressant medicines. These are usually prescribed for long-term daily control. Anti-anxiety  medicines. These may be added in severe cases, especially when panic attacks occur. Medicines will be prescribed by a health care provider. When used together, medicines, psychotherapy, and tension reduction techniques may be the most effective treatment. Relationships Relationships can play a big part in helping you recover. Try to spend more time connecting with trusted friends and family members. Consider going to couples counseling if you have a partner, taking family education classes, or going to family therapy. Therapy can help you and others better understand your condition. How to recognize changes in your anxiety Everyone responds differently to treatment for anxiety. Recovery from anxiety happens when symptoms decrease and stop interfering with your daily activities at home or work. This may mean that you will start to: Have better concentration and focus. Worry will interfere less in your daily thinking. Sleep better. Be less irritable. Have more energy. Have improved memory. It is also important to recognize when your condition is getting worse. Contact your health care provider if your symptoms interfere with home or work and you feel like your condition is not improving. Follow these instructions at home: Activity Exercise. Adults should do the following: Exercise for at least 150 minutes each week. The exercise should increase your heart rate and make you sweat (moderate-intensity exercise). Strengthening exercises at least twice a week. Get the right amount and quality of sleep. Most adults need 7-9 hours of sleep each night. Lifestyle  Eat a healthy diet that includes plenty of vegetables, fruits, whole grains, low-fat dairy products, and lean protein. Do not eat a lot of foods that are high in fats, added sugars, or salt (sodium). Make choices that simplify your life. Do not use any products that contain nicotine or tobacco. These products include cigarettes, chewing  tobacco, and vaping devices, such as e-cigarettes. If you need help quitting, ask your health care provider. Avoid caffeine, alcohol, and certain over-the-counter cold medicines. These may make you feel worse. Ask your pharmacist which medicines to avoid. General instructions Take over-the-counter and prescription medicines only as told by your health care provider. Keep all follow-up visits. This is important. Where to find support You can get help and support from these sources: Self-help groups. Online and [employer]. A trusted spiritual leader. Couples counseling. Family education classes. Family therapy. Where to find more information You may find that joining a support group helps you deal with your anxiety. The following sources can help you locate counselors or support groups near you: [place]: www.mentalhealthamerica.net Anxiety and Depression Association of [place] (ADAA): https://www.clark.net/ National Alliance on Mental Illness (NAMI): www.nami.org Contact a health care provider if: You have a hard time staying focused or finishing daily tasks. You spend many hours a day feeling worried about everyday life. You become exhausted by worry. You start to have headaches or frequently feel tense. You develop chronic nausea or diarrhea. Get help right away if: You have a racing heart and shortness of breath. You have thoughts of hurting yourself or others. If you ever feel like you may hurt yourself or others, or have thoughts about taking your own life, get help right away. Go to your nearest emergency department or: Call your local emergency services (911  in the [place] Call a suicide crisis helpline, such as the [place] at 4023100730 or 988 in the [place]. This is open 24 hours a day in the [place] Text the Crisis Text Line at (910)563-0418 (in the [place].). Summary Taking steps to learn and use tension reduction techniques can help calm you  and help prevent triggering an anxiety reaction. When used together, medicines, psychotherapy, and tension reduction techniques may be the most effective treatment. Family, friends, and partners can play a big part in supporting you. This information is not intended to replace advice given to you by your health care provider. Make sure you discuss any questions you have with your health care provider. Document Revised: 10/29/2020 Document Reviewed: 07/27/2020 Elsevier Patient Education  [place].

## 2021-09-10 ENCOUNTER — Encounter: Payer: Self-pay | Admitting: Family Medicine

## 2021-09-24 ENCOUNTER — Telehealth: Payer: Self-pay | Admitting: Internal Medicine

## 2021-09-24 ENCOUNTER — Ambulatory Visit: Payer: BC Managed Care – PPO | Admitting: Internal Medicine

## 2021-09-24 ENCOUNTER — Encounter: Payer: Self-pay | Admitting: Internal Medicine

## 2021-09-24 VITALS — BP 120/76 | HR 88 | Ht 66.0 in | Wt 195.2 lb

## 2021-09-24 DIAGNOSIS — E559 Vitamin D deficiency, unspecified: Secondary | ICD-10-CM

## 2021-09-24 DIAGNOSIS — M816 Localized osteoporosis [Lequesne]: Secondary | ICD-10-CM | POA: Diagnosis not present

## 2021-09-24 DIAGNOSIS — E876 Hypokalemia: Secondary | ICD-10-CM

## 2021-09-24 LAB — BASIC METABOLIC PANEL
BUN: 16 mg/dL (ref 6–23)
CO2: 30 mEq/L (ref 19–32)
Calcium: 10.1 mg/dL (ref 8.4–10.5)
Chloride: 98 mEq/L (ref 96–112)
Creatinine, Ser: 0.77 mg/dL (ref 0.40–1.20)
GFR: 80.88 mL/min (ref >60.00)
Glucose, Bld: 99 mg/dL (ref 70–99)
Potassium: 3.6 mEq/L (ref 3.5–5.1)
Sodium: 137 mEq/L (ref 135–145)

## 2021-09-24 LAB — VITAMIN D 25 HYDROXY (VIT D DEFICIENCY, FRACTURES): VITD: 55.93 ng/mL (ref 30.00–100.00)

## 2021-09-24 LAB — TSH: TSH: 2.1 u[IU]/mL (ref 0.35–5.50)

## 2021-09-24 NOTE — Telephone Encounter (Signed)
Can you please add her to the Prolia List ?   Thanks

## 2021-09-24 NOTE — Patient Instructions (Addendum)
Calcium 1200 mg daily ( whether through diet or pills)  Continue Ergocalciferol 50,000 iu weekly

## 2021-09-24 NOTE — Progress Notes (Signed)
Name: Catherine Cole  MRN/ DOB: [id number], 1955/05/17    Age/ Sex: 66 y.o., female    PCP: [name], MD   Reason for Endocrinology Evaluation: Osteoporosis      Date of Initial Endocrinology Evaluation: 03/23/2021    HPI: [name] is a 66 y.o. female with a past medical history of Hx of breast Ca , S/P mastectomy (07/2019), HTN, IBS . The patient presented for initial endocrinology clinic visit on 03/23/2021 for consultative assistance with her Osteoporosis .   Pt was diagnosed with osteoporosis: 08/2020 with a T-score of -2.7 at the left femoral neck     Menarche at age : 39 Menopausal at age : She had hysterectomy in her 55's ( ovaries intact) Fracture Hx: Left wrist while skating  Hx of HRT: no FH of osteoporosis or hip fracture: mother  Prior Hx of anti-estrogenic therapy :no  Prior Hx of anti-resorptive therapy : no she was offered zoledronic acid infusion but the concern was about lymphedema.  She checked with her physical therapy at which confirmed that there could be a possibility of increased swelling.  She has chronic hypokalemia as well as Vitamin D deficiency .    She was on HCTZ in the past for HTn which was switched to chlorthalidone    Has heartburn symptoms and is on prilosec  She also has IBS -diarrhea   She has lymphedema Bilateral upper extremities , She is S/P right axillary lymph node removal  but none on the left.  But has lymphedema on the left too.    Dexamethasone suppression test is normal as well as normal aldo     SUBJECTIVE:    Today (09/24/21: Ms. [name] is here for follow-up on osteoporosis. I have offered her bisphosphonate orally but she declined at the time due to GERD and IBS. She was skeptical about zoledronic acid as it is an IV infusion and may cause worsening of upper extremity lymphedema  She is currently on pump that she uses on arms alternating daily   She has IBS but no heartburn issues   anymore. Not on Prilosec anymore   She has not started calcium citrate, because her sister who is a nurse reminded her that she has history of staghorn stones and was told not to take calcium? She had self reduce her potassium to every other day  Calcium citrate 1200 mg daily- not taking  Ergocalciferol 50,000 IU weekly KCL 10 mEq 2 tabs  every other day     HISTORY:  Past Medical History:  Past Medical History:  Diagnosis Date   Adult ADHD    Anemia    Anxiety    Cancer ([place])    Phreesia 65/46/5035   Complication of anesthesia    Depression    Depression    Phreesia 05/13/2020   Family history of breast cancer    Family history of colon cancer    Family history of kidney cancer    Family history of melanoma    Family history of ovarian cancer    Hypertension    IBS (irritable bowel syndrome)    PONV (postoperative nausea and vomiting)    Past Surgical History:  Past Surgical History:  Procedure Laterality Date   ABDOMINAL HYSTERECTOMY N/A    Phreesia 05/13/2020   APPENDECTOMY     12 yrs   BREAST SURGERY N/A    Phreesia 05/13/2020   CHOLECYSTECTOMY  1995   CHOLESTEATOMA EXCISION  13 and 22 yrs   FRACTURE SURGERY     KIDNEY STONE SURGERY  1995   LESION DESTRUCTION N/A 12/03/2015   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;  Surgeon: [name], DO;  Location: [facility];  Service: Plastics;  Laterality: N/A;   MASTECTOMY W/ SENTINEL NODE BIOPSY Bilateral 07/25/2019   Procedure: BILATERAL MASTECTOMY WITH RIGHT SENTINEL LYMPH NODE BIOPSY, LEFT RISK REDUCING MASTECTOMY;  Surgeon: [name], MD;  Location: [facility];  Service: General;  Laterality: Bilateral;  PEC BLOCK   TONSILLECTOMY     15 yrs    Social History:  reports that she has quit smoking. Her smoking use included cigarettes. She has a 7.00 pack-year smoking history. She has never used smokeless tobacco. She reports that she does not drink  alcohol and does not use drugs. Family History: family history includes Alcohol abuse in her father; Brain cancer in her maternal aunt; Breast cancer in her cousin; Colon cancer in her cousin; Colon polyps in her brother, mother, sister, sister, and son; Diabetes in her father; Heart disease (age of onset: 90) in her mother; Kidney cancer in her cousin; Lung cancer in her maternal uncle; Melanoma in her maternal aunt and mother; Ovarian cancer in her maternal aunt.   HOME MEDICATIONS: Allergies as of 09/24/2021       Reactions   Latex Hives   Demerol Nausea And Vomiting   Codeine Nausea And Vomiting   Erythromycin Itching, Rash   Neosporin [neomycin-polymyxin B Gu] Itching, Rash        Medication List        Accurate as of September 24, 2021  2:02 PM. If you have any questions, ask your nurse or doctor.          benzonatate 100 MG capsule Commonly known as: TESSALON Take 1 capsule (100 mg total) by mouth 3 (three) times daily as needed for cough.   busPIRone 10 MG tablet Commonly known as: BUSPAR Take 0.5-1 tablets (5-10 mg total) by mouth 2 (two) times daily.   chlorthalidone 25 MG tablet Commonly known as: HYGROTON Take 1 tablet (25 mg total) by mouth daily.   citalopram 40 MG tablet Commonly known as: CELEXA Take 1 tablet (40 mg total) by mouth daily.   Depend Easy Fit Undergarments Misc Use daily as directed. Change several times daily as needed.   lisdexamfetamine 50 MG capsule Commonly known as: Vyvanse Take 1 capsule (50 mg total) by mouth daily.   losartan 50 MG tablet Commonly known as: COZAAR TAKE 1 TABLET BY MOUTH EVERY DAY   multivitamin with minerals Tabs tablet Take 1 tablet by mouth daily.   potassium chloride 10 MEQ tablet Commonly known as: KLOR-CON TAKE 2 TABLETS BY MOUTH DAILY   valACYclovir 500 MG tablet Commonly known as: VALTREX Take 2 tablets by mouth at acute onset for outbreak, followed by 1 tablet once a day x 5 days   Vitamin D  (Ergocalciferol) 1.25 MG (50000 UNIT) Caps capsule Commonly known as: DRISDOL Take 1 capsule (50,000 Units total) by mouth every 7 (seven) days.          REVIEW OF SYSTEMS: A comprehensive ROS was conducted with the patient and is negative except as per HPI    OBJECTIVE:  VS: BP 120/76 (BP Location: Left Arm, Patient Position: Sitting, Cuff Size: Large)   Pulse 88   Ht '5\' 6"'$  (1.676 m)   Wt 195 lb 3.2 oz (88.5 kg)  SpO2 99%   BMI 31.51 kg/m    Wt Readings from Last 3 Encounters:  09/24/21 195 lb 3.2 oz (88.5 kg)  09/09/21 195 lb 9.6 oz (88.7 kg)  08/27/21 195 lb 12.8 oz (88.8 kg)     EXAM: General: Pt appears well and is in NAD  Neck: General: Supple without adenopathy. Thyroid: Thyroid size normal.  No goiter or nodules appreciated.   Lungs: Clear with good BS bilat with no rales, rhonchi, or wheezes  Heart: Auscultation: RRR.  Extremities:  BL LE: No pretibial edema   Mental Status: Judgment, insight: Intact Orientation: Oriented to time, place, and person Mood and affect: No depression, anxiety, or agitation     DATA REVIEWED:  Latest Reference Range & Units 09/24/21 11:43  Sodium 135 - 145 mEq/L 137  Potassium 3.5 - 5.1 mEq/L 3.6  Chloride 96 - 112 mEq/L 98  CO2 19 - 32 mEq/L 30  Glucose 70 - 99 mg/dL 99  BUN 6 - 23 mg/dL 16  Creatinine 0.40 - 1.20 mg/dL 0.77  Calcium 8.4 - 10.5 mg/dL 10.1  GFR >60.00 mL/min 80.88    Latest Reference Range & Units 09/24/21 11:43  VITD 30.00 - 100.00 ng/mL 55.93    Latest Reference Range & Units 09/24/21 11:43  TSH 0.35 - 5.50 uIU/mL 2.10      DXA 08/20/2020  ASSESSMENT: The BMD measured at Femur Neck Left is 0.664 g/cm2 with a T-score of -2.7. This patient is considered osteoporotic according to [facility]) criteria.   The quality of the exam is good. L3, L4 were excluded due to degenerative changes.   Site Region Measured Date Measured Age YA BMD Significant CHANGE T-score DualFemur  Neck Left  08/20/2020    64.4         -2.7    0.664 g/cm2   AP Spine  L1-L2      08/20/2020    64.4         -1.6    0.977 g/cm2   DualFemur Total Mean 08/20/2020    64.4         -1.6    0.805 g/cm2    ASSESSMENT/PLAN/RECOMMENDATIONS:   Osteoporosis   - Emphasized the importance of  proper calcium and Vitamin D supplementation .  I explained to the patient that she may use calcium supplement with staghorn history of stones, we also discussed that patients with calcium stones is usually due to high oxalate levels rather than high calcium levels. -I have encouraged her that if she does not want to use calcium tablets to consume the equivalent of 1200 mg of calcium through her diet -Due to her fear of worsening upper extremity lymphedema she has declined zoledronic acid -I have again offered oral bisphosphonate therapy versus Prolia -We discussed the risk and the benefit of both -We have opted to proceed with Prolia, if this does not get approved or should she have a high co-pay then we will proceed with oral bisphosphonate  Medications : Consume 1200 mg of calcium daily   2. Vitamin D Deficiency :  - Vitamin D is normal , will continue   Medication  Continue Ergocalciferol 50,000 iu weekly    3.Hypokalemia:  -Endocrine secondary causes have been excluded such as Cushing syndrome and hyper Aldo -She attributes this to GI symptoms in the past -Chlorthalidone is a contributing factor   Medication  KCl 10 mEq, 2 tabs every other day  Follow-up in 1 year  Signed electronically  by: Mack Guise, MD  [facility] Endocrinology  [facility] [address] [place], [place] [address] Phone: [phone] FAX: [phone]   CC: [name], MD [address] [place] [address] Phone: [phone] Fax: [phone]   Return to Endocrinology clinic as below: Future Appointments  Date Time Provider [place]  10/12/2021   1:20 PM [name], MD [facility]  09/22/2022  2:00 PM Alizee Maple, [name], MD [facility] None

## 2021-09-26 ENCOUNTER — Other Ambulatory Visit: Payer: Self-pay | Admitting: Family Medicine

## 2021-09-26 DIAGNOSIS — F900 Attention-deficit hyperactivity disorder, predominantly inattentive type: Secondary | ICD-10-CM

## 2021-09-26 NOTE — Telephone Encounter (Signed)
Prolia VOB initiated via [email or web address] ? ?New start ? ?

## 2021-09-28 MED ORDER — LISDEXAMFETAMINE DIMESYLATE 50 MG PO CAPS: 50.0000 mg | ORAL_CAPSULE | Freq: Every day | ORAL | 0 refills | Status: DC

## 2021-09-28 NOTE — Telephone Encounter (Signed)
Controlled substance database reviewed.  Last filled #30 Vyvanse on 08/27/2021.  Refill ordered.

## 2021-10-05 NOTE — Telephone Encounter (Signed)
Prior auth required for PROLIA  PA PROCESS DETAILS: PA is required. Providers may call Medical Utilization at [phone] to initiate. Forms may be accessed online at [email or web address] & [email or web address] back to [phone] .   

## 2021-10-12 ENCOUNTER — Encounter: Payer: Self-pay | Admitting: Family Medicine

## 2021-10-12 ENCOUNTER — Ambulatory Visit: Payer: BC Managed Care – PPO | Admitting: Family Medicine

## 2021-10-12 VITALS — BP 136/68 | HR 100 | Temp 98.2°F | Resp 16 | Ht 66.0 in | Wt 193.8 lb

## 2021-10-12 DIAGNOSIS — F418 Other specified anxiety disorders: Secondary | ICD-10-CM

## 2021-10-12 DIAGNOSIS — F900 Attention-deficit hyperactivity disorder, predominantly inattentive type: Secondary | ICD-10-CM

## 2021-10-12 DIAGNOSIS — I890 Lymphedema, not elsewhere classified: Secondary | ICD-10-CM

## 2021-10-12 DIAGNOSIS — I10 Essential (primary) hypertension: Secondary | ICD-10-CM

## 2021-10-12 MED ORDER — BUSPIRONE HCL 10 MG PO TABS: 10.0000 mg | ORAL_TABLET | Freq: Two times a day (BID) | ORAL | 1 refills | Status: DC

## 2021-10-12 MED ORDER — LOSARTAN POTASSIUM 50 MG PO TABS: 50.0000 mg | ORAL_TABLET | Freq: Every day | ORAL | 1 refills | Status: DC

## 2021-10-12 MED ORDER — CITALOPRAM HYDROBROMIDE 40 MG PO TABS: 40.0000 mg | ORAL_TABLET | Freq: Every day | ORAL | 1 refills | Status: DC

## 2021-10-12 MED ORDER — CHLORTHALIDONE 25 MG PO TABS: 25.0000 mg | ORAL_TABLET | Freq: Every day | ORAL | 1 refills | Status: DC

## 2021-10-12 NOTE — Progress Notes (Signed)
Subjective:  Patient ID: Catherine Cole, female    DOB: 1956-02-08  Age: 66 y.o. MRN: [id number]  CC:  Chief Complaint  Patient presents with   ADHD   Anxiety   Hypertension    HPI Catherine Cole presents for  Edema, lymphedema, hypertension  -Support stockings and lymphedema for upper extremities treated with lymphedema pump.  Also treated with chlorthalidone, potassium and losartan for hypertension. Has pump for lymphedema ready - not yet started.  Not needing compression stockings - swelling not bad, elevates legs.   BP Readings from Last 3 Encounters:  10/12/21 136/68  09/24/21 120/76  09/09/21 136/68   Lab Results  Component Value Date   CREATININE 0.77 09/24/2021     Anxiety Follow-up from May 24.  Anxiety with move at that time with flare of her IBS.  Has been meeting with therapist, multiple visits.  She was taking BuSpar 20 mg in a.m. only.  She had doubled up on the dose a few times due to anxiety during the day and felt like that was helpful.  Less anxiety in the evening.  We did provide a note last visit to extend some time for her to complete her activities for move given flare of anxiety symptoms, and IBS.   Continue on same dose of Vyvanse as ADD symptoms were thought to be overall controlled.  Concern of focus likely related to anxiety and stress versus her underlying ADD.  Continued on Vyvanse 50 mg daily.Controlled substance database reviewed.  Last prescription for Vyvanse 50 mg #30 filled on 10/02/2021, previously May 11, previously April 5.  No concerns. Still taking celexa QD, no new side effects with med combo.  Got through the move. Has completed a lot form the move - friend reminded her of this. Working on clutter.  Taking Buspar 20mg  in am, 10mg  at night - helpful. Some bad dreams, now only on 20mg  in am only - working well for anxiety.  Talked to counselor today. Helpful.  Focus is ok. Still some anxiety component. Not sure if better on  extra dose, but was getting a lot done.        10/12/2021    1:25 PM 08/27/2021    1:52 PM 07/06/2021    1:54 PM 03/19/2021    3:35 PM 01/29/2021    4:16 PM  Depression screen PHQ 2/9  Decreased Interest 0 0 0 1 1  Down, Depressed, Hopeless 0 0 1 1 2   PHQ - 2 Score 0 0 1 2 3   Altered sleeping  1 1 1 1   Tired, decreased energy  1 1 1 1   Change in appetite  0 1 1 0  Feeling bad or failure about yourself   0 0 0 0  Trouble concentrating  1 1 1 2   Moving slowly or fidgety/restless  0 0 0 0  Suicidal thoughts  0 0 0 0  PHQ-9 Score  3 5 6 7   Difficult doing work/chores    Somewhat difficult Not difficult at all      10/12/2021    1:23 PM 01/29/2021    4:17 PM 04/02/2020    3:09 PM 12/12/2018    5:09 PM  GAD 7 : Generalized Anxiety Score  Nervous, Anxious, on Edge 1 1 1 1   Control/stop worrying 0 1 0 0  Worry too much - different things 1 1 0 0  Trouble relaxing 0 1 0 0  Restless 0 0 0 0  Easily annoyed or  irritable 0 0 0 0  Afraid - awful might happen 0 0 0 0  Total GAD 7 Score 2 4 1 1   Anxiety Difficulty  Not difficult at all  Not difficult at all     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   Family history of ovarian cancer    Family history of breast cancer    Family history of colon cancer    Family history of kidney cancer    Family history of melanoma    Vitamin D deficiency 12/12/2018   Essential hypertension, benign 10/15/2015   Kidney stone 05/02/2015   Palpitations 02/08/2015   Atypical chest pain 02/08/2015   Exertional dyspnea 02/08/2015   Lower extremity edema 02/08/2015   Mixed dyslipidemia 03/26/2014   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/03/2013   Depression with anxiety 04/26/2012   ADHD (attention deficit hyperactivity  disorder) 04/26/2012   Past Medical History:  Diagnosis Date   Adult ADHD    Anemia    Anxiety    Cancer (HCC)    Phreesia 05/13/2020   Complication of anesthesia    Depression    Depression    Phreesia 05/13/2020   Family history of breast cancer    Family history of colon cancer    Family history of kidney cancer    Family history of melanoma    Family history of ovarian cancer    Hypertension    IBS (irritable bowel syndrome)    PONV (postoperative nausea and vomiting)    Past Surgical History:  Procedure Laterality Date   ABDOMINAL HYSTERECTOMY N/A    Phreesia 05/13/2020   APPENDECTOMY     12 yrs   BREAST SURGERY N/A    Phreesia 05/13/2020   CHOLECYSTECTOMY  1995   CHOLESTEATOMA EXCISION     13 and 22 yrs   FRACTURE SURGERY     KIDNEY STONE SURGERY  1995   LESION DESTRUCTION N/A 12/03/2015   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;  Surgeon: Peggye Form, DO;  Location: [facility];  Service: Plastics;  Laterality: N/A;   MASTECTOMY W/ SENTINEL NODE BIOPSY Bilateral 07/25/2019   Procedure: BILATERAL MASTECTOMY WITH RIGHT SENTINEL LYMPH NODE BIOPSY, LEFT RISK REDUCING MASTECTOMY;  Surgeon: Harriette Bouillon, MD;  Location:  SURGERY CENTER;  Service: General;  Laterality: Bilateral;  PEC BLOCK   TONSILLECTOMY     15 yrs   Allergies  Allergen Reactions   Latex Hives   Demerol Nausea And Vomiting   Codeine Nausea And Vomiting   Erythromycin Itching and Rash   Neosporin [Neomycin-Polymyxin B Gu] Itching and Rash   Prior to Admission medications   Medication Sig Start Date End Date Taking? Authorizing Provider  busPIRone (BUSPAR) 10 MG tablet Take 0.5-1 tablets (5-10 mg total) by mouth 2 (two) times daily. 07/06/21  Yes Shade Flood, MD  chlorthalidone (HYGROTON) 25 MG tablet Take 1 tablet (25 mg total) by mouth daily. 07/06/21  Yes Shade Flood, MD  citalopram (CELEXA) 40 MG tablet Take 1 tablet (40 mg  total) by mouth daily. 07/06/21  Yes Shade Flood, MD  Incontinence Supply Disposable (DEPEND EASY FIT UNDERGARMENTS) MISC Use daily as directed. Change several times daily as needed. 06/29/18  Yes Lezlie Lye, Meda Coffee, MD  lisdexamfetamine (VYVANSE) 50 MG capsule Take 1 capsule (50 mg total) by mouth daily. 09/28/21  Yes Shade Flood, MD  losartan (COZAAR) 50 MG tablet TAKE 1 TABLET BY MOUTH EVERY DAY 06/24/21  Yes Shade Flood, MD  Multiple Vitamin (MULTIVITAMIN WITH MINERALS) TABS tablet Take 1 tablet by mouth daily.   Yes [provider]  potassium chloride (KLOR-CON) 10 MEQ tablet TAKE 2 TABLETS BY MOUTH DAILY 06/15/21  Yes Shade Flood, MD  valACYclovir (VALTREX) 500 MG tablet Take 2 tablets by mouth at acute onset for outbreak, followed by 1 tablet once a day x 5 days 01/11/20  Yes Lezlie Lye, Meda Coffee, MD  Vitamin D, Ergocalciferol, (DRISDOL) 1.25 MG (50000 UNIT) CAPS capsule Take 1 capsule (50,000 Units total) by mouth every 7 (seven) days. 03/24/21  Yes [name], Konrad Dolores, MD   Social History   Socioeconomic History   Marital status: Widowed    Spouse name: Not on file   Number of children: Not on file   Years of education: Not on file   Highest education level: Not on file  Occupational History   Not on file  Tobacco Use   Smoking status: Former    Packs/day: 1.00    Years: 7.00    Total pack years: 7.00    Types: Cigarettes   Smokeless tobacco: Never   Tobacco comments:    some day smoker when she smoked  Substance and Sexual Activity   Alcohol use: No   Drug use: No   Sexual activity: Never  Other Topics Concern   Not on file  Social History Narrative   Not on file   Social Determinants of Health   Financial Resource Strain: Not on file  Food Insecurity: Not on file  Transportation Needs: Not on file  Physical Activity: Not on file  Stress: Not on file  Social Connections: Not on file  Intimate Partner Violence: Not on file     Review of Systems Per hPI;   Objective:   Vitals:   10/12/21 1320  BP: 136/68  Pulse: 100  Resp: 16  Temp: 98.2 F (36.8 C)  TempSrc: Temporal  SpO2: 96%  Weight: 193 lb 12.8 oz (87.9 kg)  Height: 5\' 6"  (1.676 m)     Physical Exam Vitals reviewed.  Constitutional:      General: She is not in acute distress.    Appearance: Normal appearance. She is well-developed. She is not toxic-appearing.  HENT:     Head: Normocephalic and atraumatic.  Eyes:     Conjunctiva/sclera: Conjunctivae normal.     Pupils: Pupils are equal, round, and reactive to light.  Neck:     Vascular: No carotid bruit.  Cardiovascular:     Rate and Rhythm: Normal rate and regular rhythm.     Heart sounds: Normal heart sounds.  Pulmonary:     Effort: Pulmonary effort is normal.     Breath sounds: Normal breath sounds.  Abdominal:     Palpations: Abdomen is soft. There is no pulsatile mass.     Tenderness: There is no abdominal tenderness.  Musculoskeletal:     Right lower leg: No edema.     Left lower leg: No edema.  Skin:    General: Skin is warm and dry.  Neurological:     Mental Status: She is alert and oriented to person, place, and time.  Psychiatric:        Mood and Affect: Mood normal.  Behavior: Behavior normal.        Thought Content: Thought content normal.        Assessment & Plan:  Catherine Cole is a 66 y.o. female . Depression with anxiety - Plan: busPIRone (BUSPAR) 10 MG tablet Attention deficit hyperactivity disorder (ADHD), predominantly inattentive type citalopram (CELEXA) 40 MG tablet,  -Anxiety improving status post move.  Option to take BuSpar 20 mg in the morning with consistent 10 mg evening doses unless nightmares or other new side effects.  Continue Celexa same dose.  Suspect ADD stable and focus issues previously due to situational stressors/anxiety.  Continue Vyvanse same dose.   Lymphedema  -Overall stable, has lymphedema pump/treatment  options at home  Essential hypertension, benign - Plan: losartan (COZAAR) 50 MG tablet,  chlorthalidone (HYGROTON) 25 MG tablet  -Stable with current doses of meds above, continue same.  Meds ordered this encounter  Medications   losartan (COZAAR) 50 MG tablet    Sig: Take 1 tablet (50 mg total) by mouth daily.    Dispense:  90 tablet    Refill:  1   citalopram (CELEXA) 40 MG tablet    Sig: Take 1 tablet (40 mg total) by mouth daily.    Dispense:  90 tablet    Refill:  1   chlorthalidone (HYGROTON) 25 MG tablet    Sig: Take 1 tablet (25 mg total) by mouth daily.    Dispense:  90 tablet    Refill:  1   busPIRone (BUSPAR) 10 MG tablet    Sig: Take 1-2 tablets (10-20 mg total) by mouth 2 (two) times daily.    Dispense:  180 tablet    Refill:  1   Patient Instructions  If you were able to get more done and more focused on the extra 10mg  of buspar at night it may be a good idea to try that again. No change in Vyvanse dose for now.  Let me know how things are going in next 6 weeks. Recheck in 3 months, but let me know if there are questions in the meantime.  Take care!     Signed,   Meredith Staggers, MD [facility], [facility] 10/12/21 1:44 PM

## 2021-10-13 NOTE — Telephone Encounter (Signed)
Prior Authorization initiated for PROLIA via [email or web address] KEY: Catherine Cole

## 2021-10-15 NOTE — Telephone Encounter (Addendum)
KEYNancie Neas [id number]  Valid: 10/13/21-10/12/22

## 2021-10-15 NOTE — Telephone Encounter (Signed)
Pt ready for scheduling on or after 10/13/21  Out-of-pocket cost due at time of visit: $0  Primary: BCBS [place] Prolia co-insurance: 0% Admin fee co-insurance: 0%  Secondary: n/a Prolia co-insurance:  Admin fee co-insurance:   Deductible: does not apply  Prior Auth: APPROVED KEY: [id number], PA# [id number]  Valid: 10/13/21-10/12/22    ** This summary of benefits is an estimation of the patient's out-of-pocket cost. Exact cost may very based on individual plan coverage.

## 2021-10-22 ENCOUNTER — Ambulatory Visit: Payer: BC Managed Care – PPO | Admitting: Physician Assistant

## 2021-10-22 NOTE — Progress Notes (Incomplete)
Catherine Cole is a 66 y.o. female here for a {New prob or follow up:31724}.  SCRIBE STATEMENT  History of Present Illness:   No chief complaint on file.   HPI UTI  Past Medical History:  Diagnosis Date   Adult ADHD    Anemia    Anxiety    Cancer ([place])    Phreesia 40/01/2724   Complication of anesthesia    Depression    Depression    Phreesia 05/13/2020   Family history of breast cancer    Family history of colon cancer    Family history of kidney cancer    Family history of melanoma    Family history of ovarian cancer    Hypertension    IBS (irritable bowel syndrome)    PONV (postoperative nausea and vomiting)      Social History   Tobacco Use   Smoking status: Former    Packs/day: 1.00    Years: 7.00    Total pack years: 7.00    Types: Cigarettes   Smokeless tobacco: Never   Tobacco comments:    some day smoker when she smoked  Substance Use Topics   Alcohol use: No   Drug use: No    Past Surgical History:  Procedure Laterality Date   ABDOMINAL HYSTERECTOMY N/A    Phreesia 05/13/2020   APPENDECTOMY     12 yrs   BREAST SURGERY N/A    Phreesia 05/13/2020   CHOLECYSTECTOMY  1995   CHOLESTEATOMA EXCISION     13 and 22 yrs   FRACTURE SURGERY     KIDNEY STONE SURGERY  1995   LESION DESTRUCTION N/A 12/03/2015   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;  Surgeon: [name], DO;  Location: [facility];  Service: Plastics;  Laterality: N/A;   MASTECTOMY W/ SENTINEL NODE BIOPSY Bilateral 07/25/2019   Procedure: BILATERAL MASTECTOMY WITH RIGHT SENTINEL LYMPH NODE BIOPSY, LEFT RISK REDUCING MASTECTOMY;  Surgeon: [name], MD;  Location: [facility];  Service: General;  Laterality: Bilateral;  PEC BLOCK   TONSILLECTOMY     15 yrs    Family History  Problem Relation Age of Onset   Heart disease Mother 78       Heart attack   Colon polyps Mother    Melanoma Mother    Alcohol abuse  Father    Diabetes Father    Colon polyps Sister    Colon polyps Brother    Colon polyps Son    Brain cancer Maternal Aunt        pituitary cancer over 23   Lung cancer Maternal Uncle    Colon polyps Sister    Melanoma Maternal Aunt    Ovarian cancer Maternal Aunt    Breast cancer Cousin        maternal first cousin   Kidney cancer Cousin        mat first cousin   Colon cancer Cousin        mat first cousin    Allergies  Allergen Reactions   Latex Hives   Demerol Nausea And Vomiting   Codeine Nausea And Vomiting   Erythromycin Itching and Rash   Neosporin [Neomycin-Polymyxin B Gu] Itching and Rash    Current Medications:   Current Outpatient Medications:    busPIRone (BUSPAR) 10 MG tablet, Take 1-2 tablets (10-20 mg total) by mouth 2 (two) times daily., Disp: 180 tablet, Rfl: 1   chlorthalidone (HYGROTON) 25 MG tablet, Take  1 tablet (25 mg total) by mouth daily., Disp: 90 tablet, Rfl: 1   citalopram (CELEXA) 40 MG tablet, Take 1 tablet (40 mg total) by mouth daily., Disp: 90 tablet, Rfl: 1   Incontinence Supply Disposable (DEPEND EASY FIT UNDERGARMENTS) MISC, Use daily as directed. Change several times daily as needed., Disp: 120 each, Rfl: 99   lisdexamfetamine (VYVANSE) 50 MG capsule, Take 1 capsule (50 mg total) by mouth daily., Disp: 30 capsule, Rfl: 0   losartan (COZAAR) 50 MG tablet, Take 1 tablet (50 mg total) by mouth daily., Disp: 90 tablet, Rfl: 1   Multiple Vitamin (MULTIVITAMIN WITH MINERALS) TABS tablet, Take 1 tablet by mouth daily., Disp: , Rfl:    potassium chloride (KLOR-CON) 10 MEQ tablet, TAKE 2 TABLETS BY MOUTH DAILY, Disp: 180 tablet, Rfl: 1   valACYclovir (VALTREX) 500 MG tablet, Take 2 tablets by mouth at acute onset for outbreak, followed by 1 tablet once a day x 5 days, Disp: 30 tablet, Rfl: 3   Vitamin D, Ergocalciferol, (DRISDOL) 1.25 MG (50000 UNIT) CAPS capsule, Take 1 capsule (50,000 Units total) by mouth every 7 (seven) days., Disp: 12 capsule,  Rfl: 1   Review of Systems:   ROS Negative unless otherwise specified per HPI.   Vitals:   There were no vitals filed for this visit.   There is no height or weight on file to calculate BMI.  Physical Exam:   Physical Exam  Assessment and Plan:   '@DIAGLIST'$ @    I,Savera Zaman,acting as a scribe for Inda Coke, PA.,have documented all relevant documentation on the behalf of Inda Coke, PA,as directed by  Inda Coke, PA while in the presence of Inda Coke, [place].   ***  Inda Coke, PA-C

## 2021-10-26 ENCOUNTER — Other Ambulatory Visit (HOSPITAL_COMMUNITY): Payer: Self-pay

## 2021-10-27 ENCOUNTER — Ambulatory Visit (INDEPENDENT_AMBULATORY_CARE_PROVIDER_SITE_OTHER): Payer: BC Managed Care – PPO

## 2021-10-27 ENCOUNTER — Other Ambulatory Visit (HOSPITAL_COMMUNITY): Payer: Self-pay

## 2021-10-27 DIAGNOSIS — M816 Localized osteoporosis [Lequesne]: Secondary | ICD-10-CM

## 2021-10-27 MED ORDER — DENOSUMAB 60 MG/ML ~~LOC~~ SOSY
60.0000 mg | PREFILLED_SYRINGE | Freq: Once | SUBCUTANEOUS | Status: AC
  Administered 2021-10-27: 60 mg via SUBCUTANEOUS

## 2021-10-27 NOTE — Progress Notes (Signed)
After obtaining consent, and per orders of Dr.[name] , injection of Prolia '60mg'$  given by Aadhira Heffernan L Macen Joslin. Patient instructed to remain in clinic for 20 minutes afterwards, and to report any adverse reaction to me immediately.

## 2021-10-28 NOTE — Telephone Encounter (Signed)
Sent message to [facility] to see if office would like to do buy/bill or send rx to [facility] to get through pharmacy benefit.

## 2021-10-28 NOTE — Telephone Encounter (Signed)
Submitted a Prior Authorization request to [facility] for [place] via CoverMyMeds. Will update once we receive a response.   (KeySalli Quarry) - [id number]

## 2021-10-28 NOTE — Telephone Encounter (Signed)
Received notification from [facility] regarding a prior authorization for [place]. Authorization has been APPROVED from 10/28/21 to 10/29/22.   Patient must fill through [facility] for pharmacy benefit rx.  Authorization #  [id number]

## 2021-11-05 ENCOUNTER — Other Ambulatory Visit: Payer: Self-pay | Admitting: Family Medicine

## 2021-11-05 ENCOUNTER — Encounter: Payer: Self-pay | Admitting: Family Medicine

## 2021-11-05 DIAGNOSIS — F900 Attention-deficit hyperactivity disorder, predominantly inattentive type: Secondary | ICD-10-CM

## 2021-11-05 MED ORDER — LISDEXAMFETAMINE DIMESYLATE 50 MG PO CAPS: 50.0000 mg | ORAL_CAPSULE | Freq: Every day | ORAL | 0 refills | Status: DC

## 2021-11-05 NOTE — Telephone Encounter (Signed)
Controlled substance database reviewed, last filled 10/02/2021, medication discussed at recent visit.  Refill ordered.

## 2021-11-21 NOTE — Telephone Encounter (Signed)
Last Prolia inj 10/27/21 Next Prolia inj due 04/30/22

## 2021-12-09 ENCOUNTER — Other Ambulatory Visit: Payer: Self-pay | Admitting: Family Medicine

## 2021-12-09 DIAGNOSIS — F900 Attention-deficit hyperactivity disorder, predominantly inattentive type: Secondary | ICD-10-CM

## 2021-12-09 DIAGNOSIS — F418 Other specified anxiety disorders: Secondary | ICD-10-CM

## 2021-12-09 MED ORDER — LISDEXAMFETAMINE DIMESYLATE 50 MG PO CAPS: 50.0000 mg | ORAL_CAPSULE | Freq: Every day | ORAL | 0 refills | Status: DC

## 2021-12-12 ENCOUNTER — Other Ambulatory Visit: Payer: Self-pay | Admitting: Family Medicine

## 2021-12-12 DIAGNOSIS — F418 Other specified anxiety disorders: Secondary | ICD-10-CM

## 2021-12-14 ENCOUNTER — Other Ambulatory Visit: Payer: Self-pay | Admitting: Family Medicine

## 2021-12-14 DIAGNOSIS — E876 Hypokalemia: Secondary | ICD-10-CM

## 2022-01-11 ENCOUNTER — Ambulatory Visit: Payer: BC Managed Care – PPO | Admitting: Family Medicine

## 2022-01-11 ENCOUNTER — Encounter: Payer: Self-pay | Admitting: Family Medicine

## 2022-01-11 VITALS — BP 124/70 | HR 90 | Temp 98.6°F | Ht 66.0 in | Wt 195.6 lb

## 2022-01-11 DIAGNOSIS — F418 Other specified anxiety disorders: Secondary | ICD-10-CM | POA: Diagnosis not present

## 2022-01-11 DIAGNOSIS — Z23 Encounter for immunization: Secondary | ICD-10-CM | POA: Diagnosis not present

## 2022-01-11 DIAGNOSIS — F900 Attention-deficit hyperactivity disorder, predominantly inattentive type: Secondary | ICD-10-CM | POA: Diagnosis not present

## 2022-01-11 MED ORDER — ESCITALOPRAM OXALATE 20 MG PO TABS: 20.0000 mg | ORAL_TABLET | Freq: Every day | ORAL | 1 refills | Status: DC

## 2022-01-11 MED ORDER — LISDEXAMFETAMINE DIMESYLATE 60 MG PO CAPS: 60.0000 mg | ORAL_CAPSULE | Freq: Every day | ORAL | 0 refills | Status: DC

## 2022-01-11 NOTE — Progress Notes (Unsigned)
Subjective:  Patient ID: Catherine Cole, female    DOB: 05-28-1955  Age: 66 y.o. MRN: [id number]  CC:  Chief Complaint  Patient presents with   Anxiety    Pt wants to talk about med changes  GAD score 4 PHQ9 6    HPI Catherine Cole presents for   Depression with anxiety, ADD. Follow-up from June 2016 visit.  Previously had significant situational stressors with a move.  BuSpar 20 mg in the morning, 10 mg at night was helpful, but changed to just 20 mg in the morning due to some bad dreams.  Followed by counselor.  Prior focus issues were thought to be due to the anxiety/situational stressors.  Was continued on same dose of Vyvanse 50 mg daily, and continued Celexa 40 mg daily.  Work has been stressful with some file issues. Has cut back on buspar - only '20mg'$  in morning usually. Feels like Celexa is making her tired, not as much energy. Lexapro was much better. Took prozac in past - prozac burnout, then welllbutrin, ritalin, and lexapro - liked the lexapro - stopped in 2021 - some eye sx concenrs, but found not to be the cause.  Still meeting with counselor weekly.  Still having trouble with focus - would like to try higher dose of vyvanse. Feels like not being productive. No SI/HI.  Never received call from psychiatry in 2020 referral. Agrees to new referral Controlled substance database (PDMP) reviewed. No concerns appreciated.  No cp/palpitaitons, sleep on and off. Things on mind. Trouble shutting off mind at times.  Some agorapjobia with anxiety.  Normal TSH in June.      01/11/2022    1:50 PM 10/12/2021    1:25 PM 08/27/2021    1:52 PM 07/06/2021    1:54 PM 03/19/2021    3:35 PM  Depression screen PHQ 2/9  Decreased Interest 2 0 0 0 1  Down, Depressed, Hopeless 0 0 0 1 1  PHQ - 2 Score 2 0 0 1 2  Altered sleeping '2  1 1 1  '$ Tired, decreased energy '1  1 1 1  '$ Change in appetite 0  0 1 1  Feeling bad or failure about yourself  0  0 0 0  Trouble concentrating '3   1 1 1  '$ Moving slowly or fidgety/restless 0  0 0 0  Suicidal thoughts 0  0 0 0  PHQ-9 Score '8  3 5 6  '$ Difficult doing work/chores     Somewhat difficult      01/11/2022    1:57 PM 10/12/2021    1:23 PM 01/29/2021    4:17 PM 04/02/2020    3:09 PM  GAD 7 : Generalized Anxiety Score  Nervous, Anxious, on Edge '1 1 1 1  '$ Control/stop worrying 0 0 1 0  Worry too much - different things '1 1 1 '$ 0  Trouble relaxing 2 0 1 0  Restless 0 0 0 0  Easily annoyed or irritable 0 0 0 0  Afraid - awful might happen 0 0 0 0  Total GAD 7 Score '4 2 4 1  '$ Anxiety Difficulty   Not difficult at all       History Patient Active Problem List   Diagnosis Date Noted   Localized osteoporosis without current pathological fracture 03/23/2021   Genetic testing 08/03/2019   Breast cancer, right ([place]) 07/25/2019   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   Family history of ovarian cancer  Family history of breast cancer    Family history of colon cancer    Family history of kidney cancer    Family history of melanoma    Vitamin D deficiency 12/12/2018   Essential hypertension, benign 10/15/2015   Kidney stone 05/02/2015   Palpitations 02/08/2015   Atypical chest pain 02/08/2015   Exertional dyspnea 02/08/2015   Lower extremity edema 02/08/2015   Mixed dyslipidemia 03/26/2014   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/03/2013   Depression with anxiety 04/26/2012   ADHD (attention deficit hyperactivity disorder) 04/26/2012   Past Medical History:  Diagnosis Date   Adult ADHD    Anemia    Anxiety    Cancer ([place])    Phreesia 24/12/7351   Complication of anesthesia    Depression    Depression    Phreesia 05/13/2020   Family history of breast cancer    Family history of colon cancer    Family history of kidney cancer    Family history of melanoma    Family history of ovarian cancer    Hypertension    IBS (irritable bowel syndrome)    PONV (postoperative nausea and vomiting)     Past Surgical History:  Procedure Laterality Date   ABDOMINAL HYSTERECTOMY N/A    Phreesia 05/13/2020   APPENDECTOMY     12 yrs   BREAST SURGERY N/A    Phreesia 05/13/2020   CHOLECYSTECTOMY  1995   CHOLESTEATOMA EXCISION     13 and 22 yrs   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;  Surgeon: [name], DO;  Location: [facility];  Service: Plastics;  Laterality: N/A;   MASTECTOMY W/ SENTINEL NODE BIOPSY Bilateral 07/25/2019   Procedure: BILATERAL MASTECTOMY WITH RIGHT SENTINEL LYMPH NODE BIOPSY, LEFT RISK REDUCING MASTECTOMY;  Surgeon: [name], MD;  Location: [facility];  Service: General;  Laterality: Bilateral;  PEC BLOCK   TONSILLECTOMY     15 yrs   Allergies  Allergen Reactions   Latex Hives   Demerol Nausea And Vomiting   Codeine Nausea And Vomiting   Erythromycin Itching and Rash   Neosporin [Neomycin-Polymyxin B Gu] Itching and Rash   Prior to Admission medications   Medication Sig Start Date End Date Taking? Authorizing Provider  busPIRone (BUSPAR) 10 MG tablet TAKE 1-2 TABLETS BY MOUTH 2 TIMES DAILY. 12/14/21  Yes [name], MD  chlorthalidone (HYGROTON) 25 MG tablet Take 1 tablet (25 mg total) by mouth daily. 10/12/21  Yes [name], MD  citalopram (CELEXA) 40 MG tablet Take 1 tablet (40 mg total) by mouth daily. 10/12/21  Yes [name], MD  Incontinence Supply Disposable (DEPEND EASY FIT UNDERGARMENTS) MISC Use daily as directed. Change several times daily as needed. 06/29/18  Yes [name], [name], MD  lisdexamfetamine (VYVANSE) 50 MG capsule Take 1 capsule (50 mg total) by mouth daily. 12/09/21  Yes Dutch Quint B, FNP  losartan (COZAAR) 50 MG tablet Take 1 tablet (50 mg total) by mouth daily. 10/12/21  Yes [name], MD  potassium chloride (KLOR-CON) 10 MEQ tablet TAKE 2 TABLETS  BY MOUTH DAILY 12/14/21  Yes [name], MD  valACYclovir (VALTREX) 500 MG tablet Take 2 tablets by mouth at acute onset for outbreak, followed by 1 tablet once a day x 5 days 01/11/20  Yes Taylor, Anselmo,  MD  Vitamin D, Ergocalciferol, (DRISDOL) 1.25 MG (50000 UNIT) CAPS capsule Take 1 capsule (50,000 Units total) by mouth every 7 (seven) days. 03/24/21  Yes [name], [name], MD  Multiple Vitamin (MULTIVITAMIN WITH MINERALS) TABS tablet Take 1 tablet by mouth daily. Patient not taking: Reported on 01/11/2022    [provider]   Social History   Socioeconomic History   Marital status: Widowed    Spouse name: Not on file   Number of children: Not on file   Years of education: Not on file   Highest education level: Not on file  Occupational History   Not on file  Tobacco Use   Smoking status: Former    Packs/day: 1.00    Years: 7.00    Total pack years: 7.00    Types: Cigarettes   Smokeless tobacco: Never   Tobacco comments:    some day smoker when she smoked  Substance and Sexual Activity   Alcohol use: No   Drug use: No   Sexual activity: Never  Other Topics Concern   Not on file  Social History Narrative   Not on file   Social Determinants of Health   Financial Resource Strain: Not on file  Food Insecurity: Not on file  Transportation Needs: Not on file  Physical Activity: Not on file  Stress: Not on file  Social Connections: Not on file  Intimate Partner Violence: Not on file    Review of Systems   Objective:   Vitals:   01/11/22 1353  BP: 124/70  Pulse: 90  Temp: 98.6 F (37 C)  SpO2: 96%  Weight: 195 lb 9.6 oz (88.7 kg)  Height: '5\' 6"'$  (1.676 m)     Physical Exam Vitals reviewed.  Constitutional:      Appearance: Normal appearance. She is well-developed.  HENT:     Head: Normocephalic and atraumatic.  Eyes:     Conjunctiva/sclera: Conjunctivae normal.     Pupils: Pupils are equal, round, and reactive to light.   Neck:     Vascular: No carotid bruit.  Cardiovascular:     Rate and Rhythm: Normal rate and regular rhythm.     Heart sounds: Normal heart sounds.  Pulmonary:     Effort: Pulmonary effort is normal.     Breath sounds: Normal breath sounds.  Abdominal:     Palpations: Abdomen is soft. There is no pulsatile mass.     Tenderness: There is no abdominal tenderness.  Musculoskeletal:     Right lower leg: No edema.     Left lower leg: No edema.  Skin:    General: Skin is warm and dry.  Neurological:     Mental Status: She is alert and oriented to person, place, and time.  Psychiatric:        Attention and Perception: Attention normal.        Mood and Affect: Mood is anxious.        Behavior: Behavior normal.        Thought Content: Thought content is not paranoid. Thought content does not include homicidal or suicidal ideation.     Comments: Does not appear to be responding to internal stimuli.         Assessment & Plan:  Catherine Cole is a 66 y.o. female . Need for influenza vaccination - Plan: Flu Vaccine QUAD 6+ mos PF IM (Fluarix Quad PF)   No orders of the defined types were placed in this encounter.  There are no  Patient Instructions on file for this visit.    Signed,   Merri Ray, MD [place], [facility] 01/11/22 2:11 PM

## 2022-01-11 NOTE — Patient Instructions (Addendum)
We can try lexapro in place of celexa and slight higher dose of Vyvanse.  If any increased anxiety or trouble sleeping, we need to go back to lower dose vyvanse. I will also refer you to psychiatry again to help review these meds and decide on other changes.  Return to the clinic or go to the nearest emergency room if any of your symptoms worsen or new symptoms occur. Recheck in 1 month to review meds.     Managing Anxiety, Adult After being diagnosed with anxiety, you may be relieved to know why you have felt or behaved a certain way. You may also feel overwhelmed about the treatment ahead and what it will mean for your life. With care and support, you can manage this condition. How to manage lifestyle changes Managing stress and anxiety  Stress is your body's reaction to life changes and events, both good and bad. Most stress will last just a few hours, but stress can be ongoing and can lead to more than just stress. Although stress can play a major role in anxiety, it is not the same as anxiety. Stress is usually caused by something external, such as a deadline, test, or competition. Stress normally passes after the triggering event has ended.  Anxiety is caused by something internal, such as imagining a terrible outcome or worrying that something will go wrong that will devastate you. Anxiety often does not go away even after the triggering event is over, and it can become long-term (chronic) worry. It is important to understand the differences between stress and anxiety and to manage your stress effectively so that it does not lead to an anxious response. Talk with your health care provider or a counselor to learn more about reducing anxiety and stress. He or she may suggest tension reduction techniques, such as: Music therapy. Spend time creating or listening to music that you enjoy and that inspires you. Mindfulness-based meditation. Practice being aware of your normal breaths while not trying  to control your breathing. It can be done while sitting or walking. Centering prayer. This involves focusing on a word, phrase, or sacred image that means something to you and brings you peace. Deep breathing. To do this, expand your stomach and inhale slowly through your nose. Hold your breath for 3-5 seconds. Then exhale slowly, letting your stomach muscles relax. Self-talk. Learn to notice and identify thought patterns that lead to anxiety reactions and change those patterns to thoughts that feel peaceful. Muscle relaxation. Taking time to tense muscles and then relax them. Choose a tension reduction technique that fits your lifestyle and personality. These techniques take time and practice. Set aside 5-15 minutes a day to do them. Therapists can offer counseling and training in these techniques. The training to help with anxiety may be covered by some insurance plans. Other things you can do to manage stress and anxiety include: Keeping a stress diary. This can help you learn what triggers your reaction and then learn ways to manage your response. Thinking about how you react to certain situations. You may not be able to control everything, but you can control your response. Making time for activities that help you relax and not feeling guilty about spending your time in this way. Doing visual imagery. This involves imagining or creating mental pictures to help you relax. Practicing yoga. Through yoga poses, you can lower tension and promote relaxation.  Medicines Medicines can help ease symptoms. Medicines for anxiety include: Antidepressant medicines. These are usually prescribed  for long-term daily control. Anti-anxiety medicines. These may be added in severe cases, especially when panic attacks occur. Medicines will be prescribed by a health care provider. When used together, medicines, psychotherapy, and tension reduction techniques may be the most effective  treatment. Relationships Relationships can play a big part in helping you recover. Try to spend more time connecting with trusted friends and family members. Consider going to couples counseling if you have a partner, taking family education classes, or going to family therapy. Therapy can help you and others better understand your condition. How to recognize changes in your anxiety Everyone responds differently to treatment for anxiety. Recovery from anxiety happens when symptoms decrease and stop interfering with your daily activities at home or work. This may mean that you will start to: Have better concentration and focus. Worry will interfere less in your daily thinking. Sleep better. Be less irritable. Have more energy. Have improved memory. It is also important to recognize when your condition is getting worse. Contact your health care provider if your symptoms interfere with home or work and you feel like your condition is not improving. Follow these instructions at home: Activity Exercise. Adults should do the following: Exercise for at least 150 minutes each week. The exercise should increase your heart rate and make you sweat (moderate-intensity exercise). Strengthening exercises at least twice a week. Get the right amount and quality of sleep. Most adults need 7-9 hours of sleep each night. Lifestyle  Eat a healthy diet that includes plenty of vegetables, fruits, whole grains, low-fat dairy products, and lean protein. Do not eat a lot of foods that are high in fats, added sugars, or salt (sodium). Make choices that simplify your life. Do not use any products that contain nicotine or tobacco. These products include cigarettes, chewing tobacco, and vaping devices, such as e-cigarettes. If you need help quitting, ask your health care provider. Avoid caffeine, alcohol, and certain over-the-counter cold medicines. These may make you feel worse. Ask your pharmacist which medicines to  avoid. General instructions Take over-the-counter and prescription medicines only as told by your health care provider. Keep all follow-up visits. This is important. Where to find support You can get help and support from these sources: Self-help groups. Online and [employer]. A trusted spiritual leader. Couples counseling. Family education classes. Family therapy. Where to find more information You may find that joining a support group helps you deal with your anxiety. The following sources can help you locate counselors or support groups near you: [place]: www.mentalhealthamerica.net Anxiety and Depression Association of [place] (ADAA): https://www.clark.net/ National Alliance on Mental Illness (NAMI): www.nami.org Contact a health care provider if: You have a hard time staying focused or finishing daily tasks. You spend many hours a day feeling worried about everyday life. You become exhausted by worry. You start to have headaches or frequently feel tense. You develop chronic nausea or diarrhea. Get help right away if: You have a racing heart and shortness of breath. You have thoughts of hurting yourself or others. If you ever feel like you may hurt yourself or others, or have thoughts about taking your own life, get help right away. Go to your nearest emergency department or: Call your local emergency services (911 in the [place] Call a suicide crisis helpline, such as the [place] at (425)780-3070 or 988 in the [place]. This is open 24 hours a day in the [place] Text the Crisis Text Line at (276) 829-0439 (in the [place].). Summary Taking  steps to learn and use tension reduction techniques can help calm you and help prevent triggering an anxiety reaction. When used together, medicines, psychotherapy, and tension reduction techniques may be the most effective treatment. Family, friends, and partners can play a big part in supporting you. This  information is not intended to replace advice given to you by your health care provider. Make sure you discuss any questions you have with your health care provider. Document Revised: 10/29/2020 Document Reviewed: 07/27/2020 Elsevier Patient Education  [place].

## 2022-01-12 ENCOUNTER — Other Ambulatory Visit: Payer: Self-pay | Admitting: Family Medicine

## 2022-01-12 DIAGNOSIS — F900 Attention-deficit hyperactivity disorder, predominantly inattentive type: Secondary | ICD-10-CM

## 2022-01-13 ENCOUNTER — Encounter: Payer: Self-pay | Admitting: Family Medicine

## 2022-01-14 ENCOUNTER — Encounter: Payer: Self-pay | Admitting: Family Medicine

## 2022-01-14 DIAGNOSIS — F900 Attention-deficit hyperactivity disorder, predominantly inattentive type: Secondary | ICD-10-CM

## 2022-01-14 NOTE — Telephone Encounter (Signed)
Pt called about vyvance 60 mg increased dosage; states that she has been off of medication since Monday 01/11/22; Pt would like vyvance 50 mg called in to [facility] [phone]. Also states that needs prior auth for 60 mg.

## 2022-01-15 ENCOUNTER — Ambulatory Visit (INDEPENDENT_AMBULATORY_CARE_PROVIDER_SITE_OTHER): Payer: BC Managed Care – PPO | Admitting: Family Medicine

## 2022-01-15 ENCOUNTER — Telehealth: Payer: Self-pay

## 2022-01-15 ENCOUNTER — Other Ambulatory Visit: Payer: Self-pay | Admitting: Family Medicine

## 2022-01-15 ENCOUNTER — Encounter: Payer: Self-pay | Admitting: Family Medicine

## 2022-01-15 ENCOUNTER — Telehealth: Payer: Self-pay | Admitting: Family Medicine

## 2022-01-15 ENCOUNTER — Ambulatory Visit
Admission: RE | Admit: 2022-01-15 | Discharge: 2022-01-15 | Disposition: A | Discharge status: Home / Self Care | Payer: BC Managed Care – PPO | Source: Ambulatory Visit | Attending: Family Medicine | Admitting: Family Medicine

## 2022-01-15 VITALS — BP 126/78 | HR 104 | Temp 98.1°F | Resp 17 | Ht 66.0 in | Wt 193.1 lb

## 2022-01-15 DIAGNOSIS — K5792 Diverticulitis of intestine, part unspecified, without perforation or abscess without bleeding: Secondary | ICD-10-CM | POA: Diagnosis not present

## 2022-01-15 DIAGNOSIS — K769 Liver disease, unspecified: Secondary | ICD-10-CM

## 2022-01-15 DIAGNOSIS — R1032 Left lower quadrant pain: Secondary | ICD-10-CM

## 2022-01-15 LAB — BASIC METABOLIC PANEL
BUN: 15 mg/dL (ref 6–23)
CO2: 30 mEq/L (ref 19–32)
Calcium: 9.9 mg/dL (ref 8.4–10.5)
Chloride: 93 mEq/L — ABNORMAL LOW (ref 96–112)
Creatinine, Ser: 0.85 mg/dL (ref 0.40–1.20)
GFR: 71.67 mL/min (ref >60.00)
Glucose, Bld: 119 mg/dL — ABNORMAL HIGH (ref 70–99)
Potassium: 3.1 mEq/L — ABNORMAL LOW (ref 3.5–5.1)
Sodium: 135 mEq/L (ref 135–145)

## 2022-01-15 LAB — CBC WITH DIFFERENTIAL/PLATELET
Basophils Absolute: 0.1 10*3/uL (ref 0.0–0.1)
Basophils Relative: 0.5 % (ref 0.0–3.0)
Eosinophils Absolute: 0.1 10*3/uL (ref 0.0–0.7)
Eosinophils Relative: 0.7 % (ref 0.0–5.0)
HCT: 39.5 % (ref 36.0–46.0)
Hemoglobin: 13.3 g/dL (ref 12.0–15.0)
Lymphocytes Relative: 13.7 % (ref 12.0–46.0)
Lymphs Abs: 2 10*3/uL (ref 0.7–4.0)
MCHC: 33.7 g/dL (ref 30.0–36.0)
MCV: 86.8 fl (ref 78.0–100.0)
Monocytes Absolute: 1.1 10*3/uL — ABNORMAL HIGH (ref 0.1–1.0)
Monocytes Relative: 7.9 % (ref 3.0–12.0)
Neutro Abs: 11.1 10*3/uL — ABNORMAL HIGH (ref 1.4–7.7)
Neutrophils Relative %: 77.2 % — ABNORMAL HIGH (ref 43.0–77.0)
Platelets: 286 10*3/uL (ref 150.0–400.0)
RBC: 4.55 Mil/uL (ref 3.87–5.11)
RDW: 13.7 % (ref 11.5–15.5)
WBC: 14.4 10*3/uL — ABNORMAL HIGH (ref 4.0–10.5)

## 2022-01-15 MED ORDER — LISDEXAMFETAMINE DIMESYLATE 50 MG PO CAPS: 50.0000 mg | ORAL_CAPSULE | Freq: Every day | ORAL | 0 refills | Status: DC

## 2022-01-15 MED ORDER — METRONIDAZOLE 500 MG PO TABS: 500.0000 mg | ORAL_TABLET | Freq: Two times a day (BID) | ORAL | 0 refills | Status: AC

## 2022-01-15 MED ORDER — IOPAMIDOL (ISOVUE-300) INJECTION 61%
100.0000 mL | Freq: Once | INTRAVENOUS | Status: AC | PRN
  Administered 2022-01-15: 100 mL via INTRAVENOUS

## 2022-01-15 MED ORDER — CIPROFLOXACIN HCL 500 MG PO TABS: 500.0000 mg | ORAL_TABLET | Freq: Two times a day (BID) | ORAL | 0 refills | Status: AC

## 2022-01-15 NOTE — Patient Instructions (Addendum)
We will call you with your CT scan appt and let you know the results ASAP We'll notify you of your lab results and make any changes if needed START the Cipro and Flagyl twice daily Follow a liquid diet until abdominal pain improves- broth, juice, gatorade, jello, applesauce Once your stomach is feeling better you can start eating bland foods- crackers, toast, rice, chicken, etc Call with any questions or concerns Hang in there!!!

## 2022-01-15 NOTE — Progress Notes (Unsigned)
   Subjective:    Patient ID: Catherine Cole, female    DOB: January 12, 1956, 66 y.o.   MRN: [id number]  HPI Abd pain- 'i have an air pocket right here' (lower L side)  Sxs started Tuesday after dinner.  Pt states this is 'very painful' and has never had similar sxs.  Pt reports pain improves w/ holding it and applying pressure.  Hard to stand up straight.  Had chills last night 'and what felt like a fever'.  Pt reports she has not eaten anything since lunch yesterday but has been drinking lots of water.  Had constipation yesterday morning but then had normal BM later in the day.  Area is TTP.     Review of Systems For ROS see HPI     Objective:   Physical Exam Vitals reviewed.  Constitutional:      General: She is not in acute distress.    Appearance: She is well-developed. She is not ill-appearing.     Comments: Obviously uncomfortable  HENT:     Head: Normocephalic and atraumatic.  Cardiovascular:     Rate and Rhythm: Regular rhythm. Tachycardia present.     Heart sounds: Normal heart sounds.  Pulmonary:     Effort: Pulmonary effort is normal. No respiratory distress.     Breath sounds: Normal breath sounds. No wheezing or rhonchi.  Abdominal:     General: There is no distension.     Palpations: Abdomen is soft.     Tenderness: There is abdominal tenderness in the left lower quadrant. There is guarding (voluntary). There is no rebound.  Skin:    General: Skin is warm and dry.     Findings: No rash.  Neurological:     General: No focal deficit present.     Mental Status: She is alert and oriented to person, place, and time.  Psychiatric:        Mood and Affect: Mood normal.        Behavior: Behavior normal.           Assessment & Plan:  LLQ pain- new.  Suspect diverticulitis given pain out of proportion to exam findings.  Searched media, care everywhere, and old notes but was not able to find previously colonoscopy report (2017).  She does not recall if they  mentioned the presence of diverticula at that time.  Since she has never had similar sxs, will get CT scan to assess and labs.  However will not hold treatment while waiting on CT.  Start Cipro and Flagyl.  Discussed need for liquid diet until pain improves.  Reviewed supportive care and red flags that should prompt return.  Pt expressed understanding and is in agreement w/ plan.

## 2022-01-15 NOTE — Telephone Encounter (Signed)
Spoke w/ pt and advised of lab results . Advised pt to f/u w/ Dr Carlota Raspberry in 2 weeks and take her Potassium supplements .

## 2022-01-15 NOTE — Telephone Encounter (Signed)
-----   Message from Midge Minium, MD sent at 01/15/2022  4:26 PM EDT ----- Your white blood count is elevated- which isn't surprising given the fact you have diverticulitis.  This will improve as your symptoms do.  Your potassium is again low.  Please make sure you are taking your daily potassium supplement as directed and follow up w/ Dr Carlota Raspberry in 2 weeks to recheck diverticulitis and abnormal labs

## 2022-01-15 NOTE — Telephone Encounter (Signed)
$'50mg'I$  sent until prior auth completed.

## 2022-01-15 NOTE — Telephone Encounter (Signed)
Stacey @ [facility] calling with a STAT CT report  Mild acute diverticulitis and lesion on liver recommend MRI abdomen

## 2022-01-15 NOTE — Telephone Encounter (Signed)
Stat results

## 2022-01-15 NOTE — Progress Notes (Signed)
CT results noted, acute symptoms will be discussed with patient by treating provider today, Dr. Birdie Riddle.  I will order the MRI abdomen with and without contrast to evaluate the hyperenhancing lesions subcapsular right hepatic lobe.

## 2022-01-29 ENCOUNTER — Encounter: Payer: Self-pay | Admitting: Family Medicine

## 2022-01-29 ENCOUNTER — Ambulatory Visit (INDEPENDENT_AMBULATORY_CARE_PROVIDER_SITE_OTHER): Payer: BC Managed Care – PPO | Admitting: Family Medicine

## 2022-01-29 VITALS — BP 122/88 | HR 92 | Temp 98.5°F | Ht 66.0 in | Wt 190.6 lb

## 2022-01-29 DIAGNOSIS — R7989 Other specified abnormal findings of blood chemistry: Secondary | ICD-10-CM

## 2022-01-29 DIAGNOSIS — F418 Other specified anxiety disorders: Secondary | ICD-10-CM | POA: Diagnosis not present

## 2022-01-29 DIAGNOSIS — F900 Attention-deficit hyperactivity disorder, predominantly inattentive type: Secondary | ICD-10-CM | POA: Diagnosis not present

## 2022-01-29 DIAGNOSIS — K5792 Diverticulitis of intestine, part unspecified, without perforation or abscess without bleeding: Secondary | ICD-10-CM

## 2022-01-29 DIAGNOSIS — E876 Hypokalemia: Secondary | ICD-10-CM | POA: Diagnosis not present

## 2022-01-29 LAB — BASIC METABOLIC PANEL
BUN: 17 mg/dL (ref 6–23)
CO2: 24 mEq/L (ref 19–32)
Calcium: 9.7 mg/dL (ref 8.4–10.5)
Chloride: 95 mEq/L — ABNORMAL LOW (ref 96–112)
Creatinine, Ser: 0.79 mg/dL (ref 0.40–1.20)
GFR: 78.24 mL/min (ref >60.00)
Glucose, Bld: 96 mg/dL (ref 70–99)
Potassium: 3.6 mEq/L (ref 3.5–5.1)
Sodium: 133 mEq/L — ABNORMAL LOW (ref 135–145)

## 2022-01-29 MED ORDER — VITAMIN D (ERGOCALCIFEROL) 1.25 MG (50000 UNIT) PO CAPS: 50000.0000 [IU] | ORAL_CAPSULE | ORAL | 1 refills | Status: AC

## 2022-01-29 NOTE — Patient Instructions (Addendum)
Great news about the improved diverticulitis. I expect you continue to improve. Return to the clinic or go to the nearest emergency room if any of your symptoms worsen or new symptoms occur. I will refer you to gastroenterology for follow up.   I reordered vitamin D today. Keep follow up with Dr. Kelton Pillar as planned. I will check labs, continue potassium same dose for now.   Continue lexapro - great news about that working better. If focus is doing better, we have the option to continue '50mg'$  dose.  We still have the option of psychiatry follow up if lexapro becomes less effective. Keep me posted and take care!

## 2022-01-29 NOTE — Progress Notes (Signed)
Subjective:  Patient ID: Catherine Cole, female    DOB: [birth year]  Age: 66 y.o. MRN: [id number]  CC:  Chief Complaint  Patient presents with   Diverticulosis    Pt states this is a follow up from an attack 2 weeks ago   HPI [name] presents for   Depression with anxiety, ADHD Follow-up from September 25.  Medication adjustments at that time, tried higher dose of Vyvanse for concerns with focus, 60 mg.  Potential side effects and potential worsening of anxiety and insomnia discussed.  We also decided to change back to Lexapro for improved anxiety control, 20 mg daily and referred to psychiatry to determine if other med changes or adjustments needed. Unable to get '60mg'$  approved, on generic '50mg'$  still - waiting on higher dose to be approved.  Feels better on lexapro. Less anxious, focus is better.  Had call from psychiatry - prolonged wait time with calls.   Diverticulitis Evaluated September 29 by my colleague.  New left lower quadrant abdominal pain, started on Cipro and Flagyl, CT abdomen pelvis on 01/15/2022 indicated mild acute diverticulitis of the distal descending colon.  Also noted hyperenhancing lesions of the subcapsule of the right hepatic lobe.  Given history of breast cancer, MRI abdomen without and with contrast recommended for further evaluation.  This has been ordered, scheduled on 01/31/2022. Doing really good. Initially taking it easy with diet, recently started adding back more food. Grilled salmon, rice, potato yesterday without issue.  Drinking fluids. No abd pain. Still soft stools, not diarrhea. Would like to see new GI - prior Dr. [name]. Prior colonoscopy 2017.   Vitamin D deficiency With hx of osteoporosis, treated with 50k units per week. Out for past month. On meds in June.  Lab Results  Component Value Date   VD25OH 55.93 09/24/2021   Hypokalemia: 3.1 on 9/29 labs. Irregular use of potassium supplement at that visit. Was taking 3-4 pills  per week. Now 2 per day (total 50mq), and increased K in diet - bananas, potatoes.        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   Family history of ovarian cancer    Family history of breast cancer    Family history of colon cancer    Family history of kidney cancer    Family history of melanoma    Vitamin D deficiency 12/12/2018   Essential hypertension, benign 10/15/2015   Kidney stone 05/02/2015   Palpitations 02/08/2015   Atypical chest pain 02/08/2015   Exertional dyspnea 02/08/2015   Lower extremity edema 02/08/2015   Mixed dyslipidemia 03/26/2014   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/03/2013   Depression with anxiety 04/26/2012   ADHD (attention deficit hyperactivity disorder) 04/26/2012   Past Medical History:  Diagnosis Date   Adult ADHD    Anemia    Anxiety    Cancer ([place]    Phreesia 046/[id number]/8127  Complication of anesthesia    Depression    Depression    Phreesia 05/13/2020   Family history of breast cancer    Family history of colon cancer    Family history of kidney cancer    Family history of melanoma    Family history of ovarian cancer    Hypertension    IBS (irritable bowel  syndrome)    PONV (postoperative nausea and vomiting)    Past Surgical History:  Procedure Laterality Date   ABDOMINAL HYSTERECTOMY N/A    Phreesia 05/13/2020   APPENDECTOMY     12 yrs   BREAST SURGERY N/A    Phreesia 05/13/2020   CHOLECYSTECTOMY  1995   CHOLESTEATOMA EXCISION     13 and 22 yrs   FRACTURE SURGERY     KIDNEY STONE SURGERY  1995   LESION DESTRUCTION N/A 12/03/2015   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;  Surgeon: [name], DO;  Location: [facility];  Service: Plastics;   Laterality: N/A;   MASTECTOMY W/ SENTINEL NODE BIOPSY Bilateral 07/25/2019   Procedure: BILATERAL MASTECTOMY WITH RIGHT SENTINEL LYMPH NODE BIOPSY, LEFT RISK REDUCING MASTECTOMY;  Surgeon: [name], MD;  Location: [facility];  Service: General;  Laterality: Bilateral;  PEC BLOCK   TONSILLECTOMY     15 yrs   Allergies  Allergen Reactions   Latex Hives   Demerol Nausea And Vomiting   Codeine Nausea And Vomiting   Erythromycin Itching and Rash   Neosporin [Neomycin-Polymyxin B Gu] Itching and Rash   Prior to Admission medications   Medication Sig Start Date End Date Taking? Authorizing Provider  busPIRone (BUSPAR) 10 MG tablet TAKE 1-2 TABLETS BY MOUTH 2 TIMES DAILY. 12/14/21  Yes [name], MD  chlorthalidone (HYGROTON) 25 MG tablet Take 1 tablet (25 mg total) by mouth daily. 10/12/21  Yes [name], MD  escitalopram (LEXAPRO) 20 MG tablet Take 1 tablet (20 mg total) by mouth daily. 01/11/22  Yes [name], MD  Incontinence Supply Disposable (DEPEND EASY FIT UNDERGARMENTS) MISC Use daily as directed. Change several times daily as needed. 06/29/18  Yes [name], [name], MD  lisdexamfetamine (VYVANSE) 50 MG capsule Take 1 capsule (50 mg total) by mouth daily. 01/15/22  Yes [name], MD  losartan (COZAAR) 50 MG tablet Take 1 tablet (50 mg total) by mouth daily. 10/12/21  Yes [name], MD  potassium chloride (KLOR-CON) 10 MEQ tablet TAKE 2 TABLETS BY MOUTH DAILY 12/14/21  Yes [name], MD  valACYclovir (VALTREX) 500 MG tablet Take 2 tablets by mouth at acute onset for outbreak, followed by 1 tablet once a day x 5 days 01/11/20  Yes [name], [name], MD  Vitamin D, Ergocalciferol, (DRISDOL) 1.25 MG (50000 UNIT) CAPS capsule Take 1 capsule (50,000 Units total) by mouth every 7 (seven) days. 03/24/21  Yes [name], [name], MD   Social History   Socioeconomic History   Marital status: Widowed    Spouse name: Not on  file   Number of children: Not on file   Years of education: Not on file   Highest education level: Not on file  Occupational History   Not on file  Tobacco Use   Smoking status: Former    Packs/day: 1.00    Years: 7.00    Total pack years: 7.00    Types: Cigarettes   Smokeless tobacco: Never   Tobacco comments:    some day smoker when she smoked  Substance and Sexual Activity   Alcohol use: No   Drug use: No   Sexual activity: Never  Other Topics Concern   Not on file  Social History Narrative   Not on file   Social Determinants of Health   Financial Resource Strain: Not on file  Food Insecurity: Not on file  Transportation Needs: Not on file  Physical Activity: Not on file  Stress: Not on file  Social Connections: Not on file  Intimate Partner Violence: Not on file    Review of Systems Per HPI.   Objective:   Vitals:   01/29/22 1146  BP: 122/88  Pulse: 92  Temp: 98.5 F (36.9 C)  SpO2: 94%  Weight: 190 lb 9.6 oz (86.5 kg)  Height: '5\' 6"'$  (1.676 m)     Physical Exam Vitals reviewed.  Constitutional:      Appearance: Normal appearance. She is well-developed.  HENT:     Head: Normocephalic and atraumatic.  Eyes:     Conjunctiva/sclera: Conjunctivae normal.     Pupils: Pupils are equal, round, and reactive to light.  Neck:     Vascular: No carotid bruit.  Cardiovascular:     Rate and Rhythm: Normal rate and regular rhythm.     Heart sounds: Normal heart sounds.  Pulmonary:     Effort: Pulmonary effort is normal.     Breath sounds: Normal breath sounds.  Abdominal:     General: There is no distension.     Palpations: Abdomen is soft. There is no pulsatile mass.     Tenderness: There is no abdominal tenderness.  Musculoskeletal:     Right lower leg: No edema.     Left lower leg: No edema.  Skin:    General: Skin is warm and dry.  Neurological:     Mental Status: She is alert and oriented to person, place, and time.  Psychiatric:        Mood  and Affect: Mood normal.        Behavior: Behavior normal.        Thought Content: Thought content normal.     Comments: Euthymic mood, appropriate responses, does not appear anxious.         Assessment & Plan:  Catherine Cole is a 66 y.o. female . Diverticulitis - Plan: Ambulatory referral to Gastroenterology, CBC  -Improving, tolerating diet as above.  Updated CBC ordered with prior leukocytosis, but suspect normalization with improvement as above.  Follow-up with gastroenterology, referral ordered.  RTC/ER precautions if return of abdominal pain or worsening symptoms.  Depression with anxiety Attention deficit hyperactivity disorder (ADHD), predominantly inattentive type  -Improved with Lexapro, still on 50 mg dose of Vyvanse.  May not need 60 mg dose if focus has improved with treatment of anxiety.  We will continue same dose of medicines for now, still has option of follow-up with psychiatry if ineffective treatment.  RTC precautions.  Low serum vitamin D - Plan: Vitamin D, Ergocalciferol, (DRISDOL) 1.25 MG (50000 UNIT) CAPS capsule  -Stable on prior 50,000 unit dosing, reordered.  Continue follow-up with endocrinology.  Hypokalemia - Plan: Basic metabolic panel  -Increase potassium rich foods as above for now consistent use of supplement.  Repeat labs today.  Meds ordered this encounter  Medications   Vitamin D, Ergocalciferol, (DRISDOL) 1.25 MG (50000 UNIT) CAPS capsule    Sig: Take 1 capsule (50,000 Units total) by mouth every 7 (seven) days.    Dispense:  12 capsule    Refill:  1   Patient Instructions  Great news about the improved diverticulitis. I expect you continue to improve. Return to the clinic or go to the nearest emergency room if any of your symptoms worsen or new symptoms occur. I will refer you to gastroenterology for follow up.   I reordered vitamin D today. Keep follow  up with Dr. Kelton Pillar as planned. I will check labs, continue potassium same dose  for now.   Continue lexapro - great news about that working better. If focus is doing better, we have the option to continue '50mg'$  dose.  We still have the option of psychiatry follow up if lexapro becomes less effective. Keep me posted and take care!     Signed,   Merri Ray, MD [place] 01/29/22 12:34 PM

## 2022-01-31 ENCOUNTER — Ambulatory Visit
Admission: RE | Admit: 2022-01-31 | Discharge: 2022-01-31 | Disposition: A | Discharge status: Home / Self Care | Payer: BC Managed Care – PPO | Source: Ambulatory Visit | Attending: Family Medicine | Admitting: Family Medicine

## 2022-01-31 DIAGNOSIS — K769 Liver disease, unspecified: Secondary | ICD-10-CM

## 2022-01-31 MED ORDER — GADOPICLENOL 0.5 MMOL/ML IV SOLN
9.0000 mL | Freq: Once | INTRAVENOUS | Status: AC | PRN
  Administered 2022-01-31: 9 mL via INTRAVENOUS

## 2022-02-01 LAB — CBC
HCT: 43.1 % (ref 36.0–46.0)
Hemoglobin: 14.6 g/dL (ref 12.0–15.0)
MCHC: 33.9 g/dL (ref 30.0–36.0)
MCV: 88 fl (ref 78.0–100.0)
Platelets: 294 10*3/uL (ref 150.0–400.0)
RBC: 4.9 Mil/uL (ref 3.87–5.11)
RDW: 13.4 % (ref 11.5–15.5)
WBC: 8.6 10*3/uL (ref 4.0–10.5)

## 2022-02-10 ENCOUNTER — Ambulatory Visit: Payer: BC Managed Care – PPO | Admitting: Family Medicine

## 2022-02-17 ENCOUNTER — Other Ambulatory Visit: Payer: Self-pay | Admitting: Family Medicine

## 2022-02-17 ENCOUNTER — Other Ambulatory Visit (HOSPITAL_COMMUNITY): Payer: Self-pay

## 2022-02-17 DIAGNOSIS — F900 Attention-deficit hyperactivity disorder, predominantly inattentive type: Secondary | ICD-10-CM

## 2022-02-17 MED ORDER — LISDEXAMFETAMINE DIMESYLATE 50 MG PO CAPS: 50.0000 mg | ORAL_CAPSULE | Freq: Every day | ORAL | 0 refills | Status: DC

## 2022-02-17 NOTE — Telephone Encounter (Signed)
Patient is requesting a refill of the following medications: Requested Prescriptions   Pending Prescriptions Disp Refills   lisdexamfetamine (VYVANSE) 50 MG capsule 30 capsule 0    Sig: Take 1 capsule (50 mg total) by mouth daily.    Date of patient request: 02/17/22 Last office visit: 01/15/22 Date of last refill: 01/29/22 Last refill amount: 30 Follow up time period per chart: 3 months

## 2022-02-17 NOTE — Telephone Encounter (Signed)
Recent office visit in October, controlled substance database reviewed, last filled 01/15/2022.  Refill ordered.

## 2022-02-22 ENCOUNTER — Telehealth: Payer: Self-pay | Admitting: Family Medicine

## 2022-02-22 DIAGNOSIS — F900 Attention-deficit hyperactivity disorder, predominantly inattentive type: Secondary | ICD-10-CM

## 2022-02-22 NOTE — Telephone Encounter (Signed)
Patient called in to check on the Vyvanse 60 mg stated it was still needing a prior auth. I see where the 50 mg was sent in on 11/1, I let patient know that a new rx for the 60 mg will have to be sent in so that we can start a new prior auth. Patient stated that she also needed this to be written for brand name only. Please let me know if I need to do anything else to assist with this.

## 2022-02-23 ENCOUNTER — Telehealth: Payer: Self-pay

## 2022-02-23 ENCOUNTER — Other Ambulatory Visit (HOSPITAL_COMMUNITY): Payer: Self-pay

## 2022-02-23 MED ORDER — VYVANSE 60 MG PO CAPS: 60.0000 mg | ORAL_CAPSULE | ORAL | 0 refills | Status: DC

## 2022-02-23 NOTE — Telephone Encounter (Signed)
A Prior Authorization was initiated for this patients Vyvanse '60MG'$  capsules through CoverMyMeds.   Key:  [id number]

## 2022-02-23 NOTE — Telephone Encounter (Signed)
Controlled substance database reviewed.  She did fill the 50 mg on 02/17/2022.  We can try to order the 60 mg, but let me know if further information needed for prior authorization.  Should continue the 50 mg until that is covered. New rx for '60mg'$  sent.

## 2022-02-23 NOTE — Telephone Encounter (Signed)
Called to inform patient, no answer attempted to leave a message but VM was full

## 2022-02-23 NOTE — Telephone Encounter (Signed)
Patient was informed and thanked for the call and will wait for more news

## 2022-02-24 NOTE — Telephone Encounter (Signed)
Great. Please advise patient and let me know if there is anything else I need to do.

## 2022-02-24 NOTE — Telephone Encounter (Signed)
Pharmacy Patient Advocate Encounter  Prior Authorization for Vyvanse '60MG'$  capsules  has been approved.    PA# [id number] Effective dates: 02/23/2022 through 02/23/2025

## 2022-02-25 NOTE — Telephone Encounter (Signed)
Called and informed the patient and she was happy with this

## 2022-04-13 NOTE — Telephone Encounter (Signed)
Prolia VOB initiated via [email or web address]  Last Prolia inj 10/27/21 Next Prolia inj due 04/30/22  Scheduled 04/30/22

## 2022-04-27 NOTE — Telephone Encounter (Signed)
Pt ready for scheduling on or after 04/30/22  Out-of-pocket cost due at time of visit: $0  Primary: [employer] [place] Plan B Prolia co-insurance: 0% Admin fee co-insurance: 0%  Secondary: n/a Prolia co-insurance:  Admin fee co-insurance:   Deductible: does not apply  Prior Auth: APPROVED KEY: [id number], PA# [id number]  Valid: 10/13/21-10/12/22    ** This summary of benefits is an estimation of the patient's out-of-pocket cost. Exact cost may very based on individual plan coverage.

## 2022-04-27 NOTE — Telephone Encounter (Signed)
Prior auth on file  Prior Auth: APPROVED KEYNancie Neas [id number]  Valid: 10/13/21-10/12/22

## 2022-04-28 NOTE — Telephone Encounter (Signed)
LMx1 to scheduled Prolia

## 2022-04-30 ENCOUNTER — Ambulatory Visit: Payer: BC Managed Care – PPO

## 2022-05-04 ENCOUNTER — Other Ambulatory Visit: Payer: Self-pay | Admitting: Family Medicine

## 2022-05-04 DIAGNOSIS — F900 Attention-deficit hyperactivity disorder, predominantly inattentive type: Secondary | ICD-10-CM

## 2022-05-04 MED ORDER — VYVANSE 60 MG PO CAPS: 60.0000 mg | ORAL_CAPSULE | ORAL | 0 refills | Status: DC

## 2022-05-04 NOTE — Telephone Encounter (Signed)
Vyvanse 60 mg LOV: 01/29/22 Last Refill:02/23/22 Upcoming appt: 05/07/22

## 2022-05-04 NOTE — Telephone Encounter (Signed)
Medication discussed in October visit.  Controlled substance database reviewed, med last filled #30 on 03/31/2022, refill ordered.

## 2022-05-05 ENCOUNTER — Other Ambulatory Visit: Payer: Self-pay | Admitting: Family Medicine

## 2022-05-05 DIAGNOSIS — I10 Essential (primary) hypertension: Secondary | ICD-10-CM

## 2022-05-05 NOTE — Telephone Encounter (Signed)
Left message on patient's cell phone voice mail to call office to schedule next Prolia injection.

## 2022-05-06 ENCOUNTER — Encounter: Payer: Self-pay | Admitting: Family Medicine

## 2022-05-07 ENCOUNTER — Encounter: Payer: Self-pay | Admitting: Family Medicine

## 2022-05-07 ENCOUNTER — Ambulatory Visit (INDEPENDENT_AMBULATORY_CARE_PROVIDER_SITE_OTHER): Payer: BC Managed Care – PPO | Admitting: Family Medicine

## 2022-05-07 VITALS — BP 122/64 | HR 83 | Temp 98.0°F | Ht 66.0 in | Wt 193.0 lb

## 2022-05-07 DIAGNOSIS — R7989 Other specified abnormal findings of blood chemistry: Secondary | ICD-10-CM | POA: Diagnosis not present

## 2022-05-07 DIAGNOSIS — I10 Essential (primary) hypertension: Secondary | ICD-10-CM | POA: Diagnosis not present

## 2022-05-07 DIAGNOSIS — K5792 Diverticulitis of intestine, part unspecified, without perforation or abscess without bleeding: Secondary | ICD-10-CM | POA: Diagnosis not present

## 2022-05-07 DIAGNOSIS — F418 Other specified anxiety disorders: Secondary | ICD-10-CM | POA: Diagnosis not present

## 2022-05-07 DIAGNOSIS — F900 Attention-deficit hyperactivity disorder, predominantly inattentive type: Secondary | ICD-10-CM

## 2022-05-07 DIAGNOSIS — E876 Hypokalemia: Secondary | ICD-10-CM

## 2022-05-07 DIAGNOSIS — Z23 Encounter for immunization: Secondary | ICD-10-CM

## 2022-05-07 LAB — BASIC METABOLIC PANEL
BUN: 19 mg/dL (ref 6–23)
CO2: 32 mEq/L (ref 19–32)
Calcium: 10.4 mg/dL (ref 8.4–10.5)
Chloride: 95 mEq/L — ABNORMAL LOW (ref 96–112)
Creatinine, Ser: 0.86 mg/dL (ref 0.40–1.20)
GFR: 70.52 mL/min (ref >60.00)
Glucose, Bld: 93 mg/dL (ref 70–99)
Potassium: 3.3 mEq/L — ABNORMAL LOW (ref 3.5–5.1)
Sodium: 140 mEq/L (ref 135–145)

## 2022-05-07 LAB — VITAMIN D 25 HYDROXY (VIT D DEFICIENCY, FRACTURES): VITD: 24.86 ng/mL — ABNORMAL LOW (ref 30.00–100.00)

## 2022-05-07 MED ORDER — LISDEXAMFETAMINE DIMESYLATE 60 MG PO CAPS: 60.0000 mg | ORAL_CAPSULE | ORAL | 0 refills | Status: DC

## 2022-05-07 MED ORDER — LOSARTAN POTASSIUM 50 MG PO TABS: 50.0000 mg | ORAL_TABLET | Freq: Every day | ORAL | 1 refills | Status: DC

## 2022-05-07 MED ORDER — ESCITALOPRAM OXALATE 20 MG PO TABS: 20.0000 mg | ORAL_TABLET | Freq: Every day | ORAL | 1 refills | Status: DC

## 2022-05-07 MED ORDER — BUSPIRONE HCL 10 MG PO TABS: ORAL_TABLET | ORAL | 1 refills | Status: DC

## 2022-05-07 MED ORDER — CHLORTHALIDONE 25 MG PO TABS: 25.0000 mg | ORAL_TABLET | Freq: Every day | ORAL | 1 refills | Status: DC

## 2022-05-07 NOTE — Progress Notes (Signed)
Subjective:  Patient ID: Catherine Cole, female    DOB: 09-12-1955  Age: 67 y.o. MRN: [id number]  CC:  Chief Complaint  Patient presents with   Labs Only    Pt would like Vit D and potassium checked today    Diverticulitis    Notes she has been doing very well with this no concerns at this time    Depression    PHQ-9=2 no concerns   ADHD    Pt notes she cannot find vyvanse or generic anywhere      HPI Catherine Cole presents for  Follow-up.  History of diverticulitis, improvement at her October visit, plan for gastroenterology follow-up. Still trying to coordinate follow up. No further abd pain. Adjusted eating habits.   Depression: With anxiety, ADHD.  We have adjusted her medications previously tried higher dose of Vyvanse for concerns with focus, up to 60 mg.  Prior authorization was performed in November, and approved from February 23, 2022 through February 23, 2025.  Vyvanse 60 mg capsules.  Cost prohibitive with most recent prescription.  Treated with generic 50 mg previously.  Mood symptoms were improving in October on Lexapro, less anxious and focus was better.  As she was improving we decided to hold off on the psychiatry follow-up in the interim. Pharmacy has ordered Vyvanse but out of it at this time.  Last filled 03/31/22.  Current dose has worked well. No new side effects. No heart palpitations, no appetite suppression, no new insomnia, only some trouble with increased stress Some days not having to take vyvnase, taking most days and every day working.  Anxiety has been doing ok on lexapro and 2 buspar daily - improved. Prior stressors don't bother her as much.  Meeting with counselor weekly helpful.       05/07/2022   11:30 AM 01/29/2022   11:45 AM 01/11/2022    1:50 PM 10/12/2021    1:25 PM 08/27/2021    1:52 PM  Depression screen PHQ 2/9  Decreased Interest 0 0 2 0 0  Down, Depressed, Hopeless 0 0 0 0 0  PHQ - 2 Score 0 0 2 0 0  Altered sleeping 1  0 2  1  Tired, decreased energy 0 0 1  1  Change in appetite 0 0 0  0  Feeling bad or failure about yourself  0 0 0  0  Trouble concentrating 1 0 3  1  Moving slowly or fidgety/restless 0 0 0  0  Suicidal thoughts 0 0 0  0  PHQ-9 Score 2 0 8  3   Hypertension: Chlorthalidone '25mg'$  qd, losartan '50mg'$  qd.  No new side effects of meds.  Home readings: none.  BP Readings from Last 3 Encounters:  05/07/22 122/64  01/29/22 122/88  01/15/22 126/78   Lab Results  Component Value Date   CREATININE 0.79 01/29/2022   Hypokalemia On supplementation.  Stable on October labs. Takes potassium supplement 18mq QOD, unless eating food high in potassium that day. Last dose of potassium about a week ago.  Lab Results  Component Value Date   K 3.6 01/29/2022   Vitamin D deficiency With history of osteoporosis.  Treated with 50,000 units/week but she was out of that medication when seen in October.  No recent testing. Restarted 50k units weekly in October. Was taking weekly until November, sister had concerns about that high of a dose.  No current otc vit D supplement.   Last vitamin D Lab Results  Component Value Date   VD25OH 55.93 09/24/2021   HM/vaccines: Prevnar recommended. Given today. Covid and rsv vaccines discussed as options at her pharmacy.   Immunization History  Administered Date(s) Administered   Fluad Quad(high Dose 65+) 01/11/2022   Influenza,inj,Quad PF,6+ Mos 12/15/2016, 04/04/2018, 12/12/2018, 04/03/2020, 01/29/2021   Influenza-Unspecified 02/14/2014, 02/11/2016   PFIZER Comirnaty(Gray Top)Covid-19 Tri-Sucrose Vaccine 05/27/2020   PFIZER(Purple Top)SARS-COV-2 Vaccination 12/27/2019   Tdap 03/26/2014   Zoster Recombinat (Shingrix) 04/04/2020, 10/13/2020      History Patient Active Problem List   Diagnosis Date Noted   Localized osteoporosis without current pathological fracture 03/23/2021   Genetic testing 08/03/2019   Breast cancer, right ([place]) 07/25/2019    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   Family history of ovarian cancer    Family history of breast cancer    Family history of colon cancer    Family history of kidney cancer    Family history of melanoma    Vitamin D deficiency 12/12/2018   Essential hypertension, benign 10/15/2015   Kidney stone 05/02/2015   Palpitations 02/08/2015   Atypical chest pain 02/08/2015   Exertional dyspnea 02/08/2015   Lower extremity edema 02/08/2015   Mixed dyslipidemia 03/26/2014   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/03/2013   Depression with anxiety 04/26/2012   ADHD (attention deficit hyperactivity disorder) 04/26/2012   Past Medical History:  Diagnosis Date   Adult ADHD    Anemia    Anxiety    Cancer ([place])    Phreesia 82/99/3716   Complication of anesthesia    Depression    Depression    Phreesia 05/13/2020   Family history of breast cancer    Family history of colon cancer    Family history of kidney cancer    Family history of melanoma    Family history of ovarian cancer    Hypertension    IBS (irritable bowel syndrome)    PONV (postoperative nausea and vomiting)    Past Surgical History:  Procedure Laterality Date   ABDOMINAL HYSTERECTOMY N/A    Phreesia 05/13/2020   APPENDECTOMY     12 yrs   BREAST SURGERY N/A    Phreesia 05/13/2020   CHOLECYSTECTOMY  1995   CHOLESTEATOMA EXCISION     13 and 22 yrs   [place]   LESION DESTRUCTION N/A 12/03/2015   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;  Surgeon: [name], DO;  Location: [facility];  Service: Plastics;  Laterality: N/A;   MASTECTOMY W/ SENTINEL NODE BIOPSY Bilateral 07/25/2019   Procedure: BILATERAL MASTECTOMY WITH RIGHT SENTINEL LYMPH NODE BIOPSY, LEFT RISK REDUCING MASTECTOMY;  Surgeon: [name], MD;  Location: [facility];  Service: General;  Laterality: Bilateral;  PEC BLOCK    TONSILLECTOMY     15 yrs   Allergies  Allergen Reactions   Latex Hives   Demerol Nausea And Vomiting   Codeine Nausea And Vomiting   Erythromycin Itching and Rash   Neosporin [Neomycin-Polymyxin B Gu] Itching and Rash   Prior to Admission medications   Medication Sig Start Date End Date Taking? Authorizing Provider  busPIRone (BUSPAR) 10 MG tablet TAKE 1-2 TABLETS BY MOUTH 2 TIMES DAILY. 12/14/21  Yes [name], MD  chlorthalidone (HYGROTON) 25 MG tablet TAKE 1 TABLET (25 MG TOTAL) BY MOUTH DAILY. 05/05/22  Yes [name], MD  escitalopram (LEXAPRO) 20 MG tablet Take 1 tablet (  20 mg total) by mouth daily. 01/11/22  Yes [name], MD  Incontinence Supply Disposable (DEPEND EASY FIT UNDERGARMENTS) MISC Use daily as directed. Change several times daily as needed. 06/29/18  Yes [name], [name], MD  losartan (COZAAR) 50 MG tablet Take 1 tablet (50 mg total) by mouth daily. 10/12/21  Yes [name], MD  potassium chloride (KLOR-CON) 10 MEQ tablet TAKE 2 TABLETS BY MOUTH DAILY 12/14/21  Yes [name], MD  valACYclovir (VALTREX) 500 MG tablet Take 2 tablets by mouth at acute onset for outbreak, followed by 1 tablet once a day x 5 days 01/11/20  Yes [name], [name], MD  Vitamin D, Ergocalciferol, (DRISDOL) 1.25 MG (50000 UNIT) CAPS capsule Take 1 capsule (50,000 Units total) by mouth every 7 (seven) days. 01/29/22  Yes [name], MD  VYVANSE 60 MG capsule Take 1 capsule (60 mg total) by mouth every morning. 05/04/22  Yes [name], MD   Social History   Socioeconomic History   Marital status: Widowed    Spouse name: Not on file   Number of children: Not on file   Years of education: Not on file   Highest education level: Not on file  Occupational History   Not on file  Tobacco Use   Smoking status: Former    Packs/day: 1.00    Years: 7.00    Total pack years: 7.00    Types: Cigarettes   Smokeless tobacco: Never   Tobacco  comments:    some day smoker when she smoked  Substance and Sexual Activity   Alcohol use: No   Drug use: No   Sexual activity: Never  Other Topics Concern   Not on file  Social History Narrative   Not on file   Social Determinants of Health   Financial Resource Strain: Not on file  Food Insecurity: Not on file  Transportation Needs: Not on file  Physical Activity: Not on file  Stress: Not on file  Social Connections: Not on file  Intimate Partner Violence: Not on file    Review of Systems  Constitutional:  Negative for fatigue and unexpected weight change.  Respiratory:  Negative for chest tightness and shortness of breath.   Cardiovascular:  Negative for chest pain, palpitations and leg swelling.  Gastrointestinal:  Negative for abdominal pain and blood in stool.  Neurological:  Negative for dizziness, syncope, light-headedness and headaches.     Objective:   Vitals:   05/07/22 1126  BP: 122/64  Pulse: 83  Temp: 98 F (36.7 C)  TempSrc: Temporal  SpO2: 95%  Weight: 193 lb (87.5 kg)  Height: '5\' 6"'$  (1.676 m)     Physical Exam Vitals reviewed.  Constitutional:      Appearance: Normal appearance. She is well-developed.  HENT:     Head: Normocephalic and atraumatic.  Eyes:     Conjunctiva/sclera: Conjunctivae normal.     Pupils: Pupils are equal, round, and reactive to light.  Neck:     Vascular: No carotid bruit.  Cardiovascular:     Rate and Rhythm: Normal rate and regular rhythm.     Heart sounds: Normal heart sounds.  Pulmonary:     Effort: Pulmonary effort is normal.     Breath sounds: Normal breath sounds.  Abdominal:     Palpations: Abdomen is soft. There is no pulsatile mass.     Tenderness: There is no abdominal tenderness.  Musculoskeletal:     Right lower leg:  No edema.     Left lower leg: No edema.  Skin:    General: Skin is warm and dry.  Neurological:     Mental Status: She is alert and oriented to person, place, and time.   Psychiatric:        Mood and Affect: Mood normal.        Behavior: Behavior normal.        Assessment & Plan:  Catherine Cole is a 67 y.o. female . Attention deficit hyperactivity disorder (ADHD), predominantly inattentive type - Plan: lisdexamfetamine (VYVANSE) 60 MG capsule  -Overall stable with current dose of Vyvanse.  Continue same.  New prescription printed to help with availability and to make sure they are filling generic for improved cost.  Follow-up in 6 months, okay to refill meds until that time if stable.  Diverticulitis  -Resolved, following up with gastroenterology as planned.  RTC precautions.  Depression with anxiety - Plan: busPIRone (BUSPAR) 10 MG tablet, escitalopram (LEXAPRO) 20 MG tablet  -Overall stable, some persistent work stressors but appears to be healing as well.  Continue same dose Lexapro, BuSpar.  52-monthfollow-up  Hypokalemia - Plan: Basic metabolic panel  -Intermittent supplementation as above.  Check a BMP and adjust meds/doses accordingly.  Low serum vitamin D - Plan: Vitamin D (25 hydroxy)  -Check vitamin D level, currently off meds.  Can decide on over-the-counter supplementation versus 50,000 unit dosing once labs return.  Essential hypertension, benign - Plan: Basic metabolic panel, chlorthalidone (HYGROTON) 25 MG tablet, losartan (COZAAR) 50 MG tablet   Stable, tolerating current regimen. Medications refilled. Labs pending as above.   Need for pneumonia vaccine, Prevnar 20 given.  Other vaccines were discussed.  Declines RSV at this time. Meds ordered this encounter  Medications   lisdexamfetamine (VYVANSE) 60 MG capsule    Sig: Take 1 capsule (60 mg total) by mouth every morning.    Dispense:  30 capsule    Refill:  0   busPIRone (BUSPAR) 10 MG tablet    Sig: TAKE 1-2 TABLETS BY MOUTH 2 TIMES DAILY.    Dispense:  180 tablet    Refill:  1   chlorthalidone (HYGROTON) 25 MG tablet    Sig: Take 1 tablet (25 mg total) by mouth  daily.    Dispense:  90 tablet    Refill:  1   escitalopram (LEXAPRO) 20 MG tablet    Sig: Take 1 tablet (20 mg total) by mouth daily.    Dispense:  90 tablet    Refill:  1   losartan (COZAAR) 50 MG tablet    Sig: Take 1 tablet (50 mg total) by mouth daily.    Dispense:  90 tablet    Refill:  1   Patient Instructions  Let me know if I can help with gastroenterology follow up.  I will print Vyvanse (generic written to see if that is easier to have that filled), and can check other pharmacies for availability if needed.   If any concerns on lab I will let you know. No changes for now.  Take care!    Signed,   JMerri Ray MD [facility] 05/07/22 12:28 PM

## 2022-05-07 NOTE — Patient Instructions (Addendum)
Let me know if I can help with gastroenterology follow up.  I will print Vyvanse (generic written to see if that is easier to have that filled), and can check other pharmacies for availability if needed.   If any concerns on lab I will let you know. No changes for now.  Take care!

## 2022-05-11 ENCOUNTER — Other Ambulatory Visit: Payer: Self-pay | Admitting: Family Medicine

## 2022-05-11 DIAGNOSIS — R7989 Other specified abnormal findings of blood chemistry: Secondary | ICD-10-CM

## 2022-05-11 DIAGNOSIS — E876 Hypokalemia: Secondary | ICD-10-CM

## 2022-05-11 NOTE — Progress Notes (Signed)
See labs 

## 2022-05-13 NOTE — Telephone Encounter (Signed)
Patient is having dental surgery that will be ongoing for at least 6 more months.  If patient decides to continue with the Prolia injections she will give the office a call to schedule at a later date.  She is unsure at this time if she will continue the injections due to certain issues.

## 2022-05-17 ENCOUNTER — Encounter: Payer: Self-pay | Admitting: Family Medicine

## 2022-05-17 ENCOUNTER — Telehealth: Payer: Self-pay | Admitting: Family Medicine

## 2022-05-17 DIAGNOSIS — F900 Attention-deficit hyperactivity disorder, predominantly inattentive type: Secondary | ICD-10-CM

## 2022-05-17 MED ORDER — LISDEXAMFETAMINE DIMESYLATE 60 MG PO CAPS: 60.0000 mg | ORAL_CAPSULE | ORAL | 0 refills | Status: DC

## 2022-05-17 NOTE — Telephone Encounter (Signed)
Pt called stating she let an urgent message in Mychart about her medication and pharmacy.

## 2022-05-17 NOTE — Telephone Encounter (Signed)
Pt notes her mail order has stock of the generic vyvanse and is requesting it be sent there, I have pended to the [facility] below

## 2022-05-17 NOTE — Telephone Encounter (Signed)
I ordered, but will need to sign printed copy to fax when in office.

## 2022-05-19 MED ORDER — LISDEXAMFETAMINE DIMESYLATE 60 MG PO CAPS: 60.0000 mg | ORAL_CAPSULE | ORAL | 0 refills | Status: DC

## 2022-06-01 NOTE — Addendum Note (Signed)
Addended by: Patrcia Dolly on: 06/01/2022 02:43 PM   Modules accepted: Orders

## 2022-06-01 NOTE — Telephone Encounter (Signed)
Will you re send or re print appears medication was not received

## 2022-06-02 MED ORDER — LISDEXAMFETAMINE DIMESYLATE 60 MG PO CAPS: 60.0000 mg | ORAL_CAPSULE | ORAL | 0 refills | Status: DC

## 2022-06-02 NOTE — Telephone Encounter (Signed)
Controlled substance database (PDMP) reviewed. No concerns appreciated.  Last filled 05/06/22, # 20.  Called provided number - [phone], spoke to Luis M. Cintron.  Unable to call this med in as controlled.  I sent to [facility] electronically.  They did advise me that if they do not have that medication in stock, will reach out to [place] to look at other options.  Let me know if I can help further.

## 2022-06-02 NOTE — Telephone Encounter (Signed)
Caller name: Luthien Cadogan  On DPR?: Yes  Call back number: [phone] (mobile)  Provider they see: [name], MD  Reason for call:Pt called stating she has become aware that this a controlled substance and must be called in. She's asking that the pharmacy be called at [phone] and a CMA call her for confirmation as well.

## 2022-06-02 NOTE — Telephone Encounter (Signed)
Pt aware Rx has been sent in

## 2022-06-02 NOTE — Addendum Note (Signed)
Addended by: Merri Ray R on: 06/02/2022 03:32 PM   Modules accepted: Orders

## 2022-06-05 ENCOUNTER — Other Ambulatory Visit: Payer: Self-pay | Admitting: Family Medicine

## 2022-06-05 DIAGNOSIS — F418 Other specified anxiety disorders: Secondary | ICD-10-CM

## 2022-06-05 DIAGNOSIS — E876 Hypokalemia: Secondary | ICD-10-CM

## 2022-06-07 ENCOUNTER — Telehealth: Payer: Self-pay | Admitting: Family Medicine

## 2022-06-07 NOTE — Telephone Encounter (Signed)
[facility] called informing office that the above generic medication is out of stock - there is no time frame as per when will it be back on stock.  lisdexamfetamine (VYVANSE) 60 MG capsule  Please call [phone] should you have any questions using Ref no. [id number].

## 2022-06-07 NOTE — Telephone Encounter (Signed)
Generic is not available anywhere pt notes insurance is not covering brand name, not sure what to do now asked if she should stop meds due to shortage across the board, or stopping for now

## 2022-06-08 MED ORDER — AMPHETAMINE-DEXTROAMPHET ER 30 MG PO CP24: 30.0000 mg | ORAL_CAPSULE | ORAL | 0 refills | Status: DC

## 2022-06-08 NOTE — Telephone Encounter (Signed)
Sorry this has been difficult.  We can try to switch to Adderall XR at similar dose which would be 30 mg each day.  I have sent that to [facility] but if that is not available can look at other options.  Let me know if she has questions.  Once Vyvanse is more readily available we can switch back if she would prefer.

## 2022-06-08 NOTE — Telephone Encounter (Signed)
Called and informed the patient  

## 2022-06-15 ENCOUNTER — Telehealth: Payer: Self-pay | Admitting: Family Medicine

## 2022-06-15 NOTE — Telephone Encounter (Signed)
Encourage patient to contact the pharmacy for refills or they can request refills through [place] TO: [id number] [place]:amphetamine-dextroamphetamine (ADDERALL XR) 30 MG 24 hr capsule  OR they'll do immediate release. The pharmacy has both.  NOTES/COMMENTS FROM PATIENT:    [facility] please notify patient: It takes 48-72 hours to process rx refill requests Ask patient to call pharmacy to ensure rx is ready before heading there.

## 2022-06-16 ENCOUNTER — Telehealth: Payer: Self-pay

## 2022-06-16 ENCOUNTER — Other Ambulatory Visit: Payer: Self-pay

## 2022-06-16 NOTE — Telephone Encounter (Signed)
PA is not on CMM

## 2022-06-16 NOTE — Telephone Encounter (Signed)
Message sent to PA pool

## 2022-06-16 NOTE — Telephone Encounter (Signed)
Pt requires PA for [place]rell '30mg'$ .

## 2022-06-18 ENCOUNTER — Telehealth: Payer: Self-pay

## 2022-06-18 ENCOUNTER — Telehealth: Payer: Self-pay | Admitting: Family Medicine

## 2022-06-18 ENCOUNTER — Other Ambulatory Visit: Payer: Self-pay | Admitting: Family Medicine

## 2022-06-18 ENCOUNTER — Other Ambulatory Visit (HOSPITAL_COMMUNITY): Payer: Self-pay

## 2022-06-18 MED ORDER — AMPHETAMINE-DEXTROAMPHET ER 30 MG PO CP24: 30.0000 mg | ORAL_CAPSULE | ORAL | 0 refills | Status: DC

## 2022-06-18 NOTE — Telephone Encounter (Signed)
Caller name: Catherine Cole  On DPR?: Yes  Call back number: [phone] (mobile)  Provider they see: [name], MD  Reason for call:Vyvanse is on backorder and switched to Adderall mail order -now mail order is out. Now the pharmacy at [address] [place], [place] [address] has it but she has no prescription at this point.

## 2022-06-18 NOTE — Telephone Encounter (Signed)
Called back to clarify which Rx are we [facility] [address] [facility]? The vyvanse or the adderall?  No answer, LM

## 2022-06-18 NOTE — Telephone Encounter (Signed)
error 

## 2022-06-18 NOTE — Telephone Encounter (Signed)
Pharmacy Patient Advocate Encounter  Prior Authorization for Amphetamine-Dextroamphetamine has been approved by [employer].    PA # [id number] Effective dates: 06/18/2022 through 06/18/2023

## 2022-06-18 NOTE — Progress Notes (Signed)
Notified the pt that her adderall has been approved and Dr Carlota Raspberry [facility] [address]

## 2022-06-18 NOTE — Progress Notes (Signed)
Chart reviewed, it appears that a prior authorization has been required and submitted today to covermymeds for her amphetamine-dextroamphetamine ER 30 mg capsules, however I will try sending it to [facility].   Rx sent, called pharmacy - they have rx, it did clear insurance and they are working on filling it. Please advise patient of above.

## 2022-06-18 NOTE — Telephone Encounter (Signed)
I have notified the pt and informed her that her Adderall has been approved from 06/18/22-06/18/23

## 2022-06-18 NOTE — Telephone Encounter (Signed)
PA approved. Effective dates: 06/18/2022 through 06/18/2023

## 2022-06-18 NOTE — Telephone Encounter (Signed)
PA submitted via CMM. Key: [id number] . Status pending

## 2022-06-18 NOTE — Telephone Encounter (Signed)
Pharmacy Patient Advocate Encounter   Received notification that prior authorization for Amphetamine-Dextroamphet ER '30MG'$  er capsules is required/requested.  Per Test Claim: PA required   PA submitted on 06/18/22 to (ins) [employer] via CoverMyMeds Key or [facility]) confirmation #  [id number]  Status is pending

## 2022-07-18 ENCOUNTER — Other Ambulatory Visit: Payer: Self-pay | Admitting: Family Medicine

## 2022-07-19 NOTE — Telephone Encounter (Signed)
Adderall XR 20 mg LOV: 05/07/22 Last Refill:06/18/22 Upcoming appt: 11/08/22

## 2022-07-20 MED ORDER — AMPHETAMINE-DEXTROAMPHET ER 30 MG PO CP24: 30.0000 mg | ORAL_CAPSULE | ORAL | 0 refills | Status: DC

## 2022-07-20 NOTE — Telephone Encounter (Signed)
Medications discussed January 19.  Controlled substance database reviewed.  Dextroamphetamine amphetamine ER 30 mg filled on 06/18/2022, prior authorization for Adderall from 06/18/2022 through 06/18/2023.  Adderall XR refill ordered.

## 2022-07-24 ENCOUNTER — Other Ambulatory Visit: Payer: Self-pay | Admitting: Family Medicine

## 2022-07-24 DIAGNOSIS — R7989 Other specified abnormal findings of blood chemistry: Secondary | ICD-10-CM

## 2022-08-17 ENCOUNTER — Other Ambulatory Visit: Payer: Self-pay | Admitting: Family Medicine

## 2022-08-18 MED ORDER — AMPHETAMINE-DEXTROAMPHET ER 30 MG PO CP24: 30.0000 mg | ORAL_CAPSULE | ORAL | 0 refills | Status: DC

## 2022-08-18 NOTE — Telephone Encounter (Signed)
Controlled substance database reviewed, last filled #30 on 07/20/2022.  Medication discussed in January.  Refill ordered.

## 2022-08-18 NOTE — Telephone Encounter (Signed)
Patient is requesting a refill of the following medications: Requested Prescriptions   Pending Prescriptions Disp Refills   amphetamine-dextroamphetamine (ADDERALL XR) 30 MG 24 hr capsule 30 capsule 0    Sig: Take 1 capsule (30 mg total) by mouth every morning.    Date of patient request: 08/18/22 Last office visit: 05/07/22 Date of last refill: 07/20/22 Last refill amount: 30 Follow up time period per chart: 6 months

## 2022-08-18 NOTE — Telephone Encounter (Signed)
Patient is requesting a refill of the following medications: Requested Prescriptions   Pending Prescriptions Disp Refills   amphetamine-dextroamphetamine (ADDERALL XR) 30 MG 24 hr capsule 30 capsule 0    Sig: Take 1 capsule (30 mg total) by mouth every morning.    Date of patient request:08/18/2022 Last office visit: 05/07/2022 Date of last refill: 07/20/2022 Last refill amount: 30 Follow up time period per chart: 6 months

## 2022-09-15 ENCOUNTER — Other Ambulatory Visit: Payer: Self-pay | Admitting: Family Medicine

## 2022-09-15 MED ORDER — AMPHETAMINE-DEXTROAMPHET ER 30 MG PO CP24: 30.0000 mg | ORAL_CAPSULE | ORAL | 0 refills | Status: DC

## 2022-09-15 NOTE — Telephone Encounter (Signed)
Adderall XR 30 mg LOV: 05/07/22 Last Refill:08/18/22 Upcoming appt: 11/08/22

## 2022-09-15 NOTE — Telephone Encounter (Signed)
Medication discussed at January office visit, 51-month follow-up planned.  Controlled substance database reviewed, Dextroamphetamine-amphetamine ER 30 mg last filled #30 on 08/18/2022, refill ordered.

## 2022-09-22 ENCOUNTER — Ambulatory Visit: Payer: BC Managed Care – PPO | Admitting: Internal Medicine

## 2022-10-15 ENCOUNTER — Other Ambulatory Visit: Payer: Self-pay | Admitting: Family Medicine

## 2022-10-15 MED ORDER — AMPHETAMINE-DEXTROAMPHET ER 30 MG PO CP24: 30.0000 mg | ORAL_CAPSULE | ORAL | 0 refills | Status: DC

## 2022-10-15 NOTE — Telephone Encounter (Signed)
Controlled substance database reviewed, last filled 09/19/2022.  Medications discussed at last office visit in January.  Refill ordered.

## 2022-10-15 NOTE — Telephone Encounter (Signed)
Patient is requesting a refill of the following medications: Requested Prescriptions   Pending Prescriptions Disp Refills   amphetamine-dextroamphetamine (ADDERALL XR) 30 MG 24 hr capsule 30 capsule 0    Sig: Take 1 capsule (30 mg total) by mouth every morning.    Date of patient request: 10/15/22 Last office visit: 05/07/22 Date of last refill: 09/15/22 Last refill amount: 30 Follow up time period per chart: 6 months

## 2022-11-08 ENCOUNTER — Encounter: Payer: Self-pay | Admitting: Family Medicine

## 2022-11-08 ENCOUNTER — Ambulatory Visit: Payer: BC Managed Care – PPO | Admitting: Family Medicine

## 2022-11-08 VITALS — BP 130/72 | HR 93 | Temp 97.9°F | Ht 66.0 in | Wt 196.0 lb

## 2022-11-08 DIAGNOSIS — F418 Other specified anxiety disorders: Secondary | ICD-10-CM

## 2022-11-08 DIAGNOSIS — K769 Liver disease, unspecified: Secondary | ICD-10-CM

## 2022-11-08 DIAGNOSIS — R7989 Other specified abnormal findings of blood chemistry: Secondary | ICD-10-CM | POA: Diagnosis not present

## 2022-11-08 DIAGNOSIS — F900 Attention-deficit hyperactivity disorder, predominantly inattentive type: Secondary | ICD-10-CM | POA: Diagnosis not present

## 2022-11-08 DIAGNOSIS — I10 Essential (primary) hypertension: Secondary | ICD-10-CM

## 2022-11-08 DIAGNOSIS — E876 Hypokalemia: Secondary | ICD-10-CM

## 2022-11-08 MED ORDER — AMPHETAMINE-DEXTROAMPHET ER 10 MG PO CP24
10.0000 mg | ORAL_CAPSULE | Freq: Every day | ORAL | 0 refills | Status: DC
Start: 2022-11-08 — End: 2022-12-09

## 2022-11-08 NOTE — Progress Notes (Signed)
Subjective:  Patient ID: Catherine Cole, female    DOB: [birth year]  Age: 67 y.o. MRN: [id number]  CC:  Chief Complaint  Patient presents with   Medical Management of Chronic Issues    Adderall seems to be okay but could be better effectiveness notes she is back in the office now     HPI Catherine Cole presents for   ADHD: SeeDiscussed in January. Hx of anxiety with ADHD. Med adjustments prior - vyvanse up to 60mg , had to be changed to Adderall d/t availability issues.  Lexapro 20mg  every day and Buspar 20mg  BID for anxiety. Option of psychiatry follow up in past, deferred as improving, and counseling weekly - still meeting with her weekly - working well.   Taking Adderall XR 30mg  QAM at 5:30.  No palpitations, appetite suppression or insomnia.  Some decreased focus on current dose - back in office now, would like to try higher dose if possible.  Feels like anxiety has been doing well on current meds.      11/08/2022    2:18 PM 05/07/2022   11:30 AM 01/29/2022   11:45 AM 01/11/2022    1:50 PM 10/12/2021    1:25 PM  Depression screen PHQ 2/9  Decreased Interest  0 0 2 0  Down, Depressed, Hopeless 0 0 0 0 0  PHQ - 2 Score 0 0 0 2 0  Altered sleeping 1 1 0 2   Tired, decreased energy 0 0 0 1   Change in appetite 1 0 0 0   Feeling bad or failure about yourself  0 0 0 0   Trouble concentrating 1 1 0 3   Moving slowly or fidgety/restless 1 0 0 0   Suicidal thoughts 0 0 0 0   PHQ-9 Score 4 2 0 8       11/08/2022    2:19 PM 01/11/2022    1:57 PM 10/12/2021    1:23 PM 01/29/2021    4:17 PM  GAD 7 : Generalized Anxiety Score  Nervous, Anxious, on Edge 1 1 1 1   Control/stop worrying 0 0 0 1  Worry too much - different things 1 1 1 1   Trouble relaxing 0 2 0 1  Restless 0 0 0 0  Easily annoyed or irritable 1 0 0 0  Afraid - awful might happen 0 0 0 0  Total GAD 7 Score 3 4 2 4   Anxiety Difficulty    Not difficult at all      Hypokalemia Treated with  supplementation.  10 meq every other day previously, off for 1 week on her last labs.  High potassium foods as well. Taking 2pills 2 times per week and high potassium foods.  Lab Results  Component Value Date   NA 140 05/07/2022   K 3.3 (L) 05/07/2022   CL 95 (L) 05/07/2022   CO2 32 05/07/2022   Vitamin D deficiency: With history of osteoporosis.  Previous 50,000 unit/week dosing.  Off supplementation at her last visit in January. OTC vit D supplement about once per week - unknown dose. ?1000units.  Last vitamin D Lab Results  Component Value Date   VD25OH 24.86 (L) 05/07/2022    Hypertension: Chlorthalidone 25 mg daily, losartan 50 mg daily.no new side effects with meds.  Home readings: none BP Readings from Last 3 Encounters:  11/08/22 130/72  05/07/22 122/64  01/29/22 122/88   Lab Results  Component Value Date   CREATININE 0.86 05/07/2022  Liver lesions Imaging in October of last year, plan for 3 to 34-month follow-up for  2 right hepatic lobe areas of subcapsular arterial phase and less so delayed hyperenhancement, without correlate on other pulse Sequences.  Unlikely metastatic disease but given history of breast cancer, repeat imaging recommended as above.  Hepatic steatosis noted, no extrahepatic metastatic disease or acute process identified on prior imaging.  Agrees to repeat imaging at this time. 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   Family history of ovarian cancer    Family history of breast cancer    Family history of colon cancer    Family history of kidney cancer    Family history of melanoma    Vitamin D deficiency 12/12/2018   Essential hypertension, benign 10/15/2015   Kidney stone 05/02/2015   Palpitations 02/08/2015    Atypical chest pain 02/08/2015   Exertional dyspnea 02/08/2015   Lower extremity edema 02/08/2015   Mixed dyslipidemia 03/26/2014   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/03/2013   Depression with anxiety 04/26/2012   ADHD (attention deficit hyperactivity disorder) 04/26/2012   Past Medical History:  Diagnosis Date   Adult ADHD    Anemia    Anxiety    Cancer (HCC)    Phreesia 05/13/2020   Complication of anesthesia    Depression    Depression    Phreesia 05/13/2020   Family history of breast cancer    Family history of colon cancer    Family history of kidney cancer    Family history of melanoma    Family history of ovarian cancer    Hypertension    IBS (irritable bowel syndrome)    PONV (postoperative nausea and vomiting)    Past Surgical History:  Procedure Laterality Date   ABDOMINAL HYSTERECTOMY N/A    Phreesia 05/13/2020   APPENDECTOMY     12 yrs   BREAST SURGERY N/A    Phreesia 05/13/2020   CHOLECYSTECTOMY  1995   CHOLESTEATOMA EXCISION     13 and 22 yrs   FRACTURE SURGERY     KIDNEY STONE SURGERY  1995   LESION DESTRUCTION N/A 12/03/2015   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;  Surgeon: Peggye Form, DO;  Location: [facility];  Service: Plastics;  Laterality: N/A;   MASTECTOMY W/ SENTINEL NODE BIOPSY Bilateral 07/25/2019   Procedure: BILATERAL MASTECTOMY WITH RIGHT SENTINEL LYMPH NODE BIOPSY, LEFT RISK REDUCING MASTECTOMY;  Surgeon: Harriette Bouillon, MD;  Location: [facility];  Service: General;  Laterality: Bilateral;  PEC BLOCK   TONSILLECTOMY     15 yrs   Allergies  Allergen Reactions   Latex Hives   Demerol Nausea And Vomiting   Codeine Nausea And Vomiting   Erythromycin Itching and Rash   Neosporin [Neomycin-Polymyxin B Gu] Itching and Rash   Prior to Admission medications   Medication Sig Start Date End Date Taking? Authorizing Provider  amphetamine-dextroamphetamine (ADDERALL XR) 30  MG 24 hr capsule Take 1 capsule (30 mg total) by mouth every morning. 10/15/22  Yes Shade Flood, MD  busPIRone (BUSPAR) 10 MG tablet TAKE 1-2 TABLETS BY MOUTH 2 TIMES DAILY. 06/07/22  Yes Shade Flood, MD  chlorthalidone (HYGROTON) 25 MG tablet Take 1 tablet (25 mg total) by mouth daily. 05/07/22  Yes Shade Flood, MD  escitalopram (LEXAPRO) 20 MG tablet Take 1 tablet (20 mg total) by mouth daily. 05/07/22  Yes Shade Flood, MD  Incontinence Supply Disposable (DEPEND EASY FIT UNDERGARMENTS) MISC Use daily as directed. Change several times daily as needed. 06/29/18  Yes Lezlie Lye, Meda Coffee, MD  losartan (COZAAR) 50 MG tablet Take 1 tablet (50 mg total) by mouth daily. 05/07/22  Yes Shade Flood, MD  potassium chloride (KLOR-CON) 10 MEQ tablet TAKE 2 TABLETS BY MOUTH EVERY DAY 06/07/22  Yes Shade Flood, MD  valACYclovir (VALTREX) 500 MG tablet Take 2 tablets by mouth at acute onset for outbreak, followed by 1 tablet once a day x 5 days 01/11/20  Yes Lezlie Lye, Meda Coffee, MD  Vitamin D, Ergocalciferol, (DRISDOL) 1.25 MG (50000 UNIT) CAPS capsule Take 1 capsule (50,000 Units total) by mouth every 7 (seven) days. 01/29/22  Yes Shade Flood, MD   Social History   Socioeconomic History   Marital status: Widowed    Spouse name: Not on file   Number of children: Not on file   Years of education: Not on file   Highest education level: Not on file  Occupational History   Not on file  Tobacco Use   Smoking status: Former    Current packs/day: 1.00    Average packs/day: 1 pack/day for 7.0 years (7.0 ttl pk-yrs)    Types: Cigarettes   Smokeless tobacco: Never   Tobacco comments:    some day smoker when she smoked  Substance and Sexual Activity   Alcohol use: No   Drug use: No   Sexual activity: Never  Other Topics Concern   Not on file  Social History Narrative   Not on file   Social Determinants of Health   Financial Resource Strain: Not on file  Food  Insecurity: Not on file  Transportation Needs: Not on file  Physical Activity: Not on file  Stress: Not on file  Social Connections: Not on file  Intimate Partner Violence: Not on file    Review of Systems  Constitutional:  Negative for fatigue and unexpected weight change.  Respiratory:  Negative for chest tightness and shortness of breath.   Cardiovascular:  Negative for chest pain, palpitations and leg swelling.  Gastrointestinal:  Negative for abdominal pain and blood in stool.  Neurological:  Negative for dizziness, syncope, light-headedness and headaches.     Objective:   Vitals:   11/08/22 1420  BP: 130/72  Pulse: 93  Temp: 97.9 F (36.6 C)  TempSrc: Temporal  SpO2: 96%  Weight: 196 lb (88.9 kg)  Height: 5\' 6"  (1.676 m)     Physical Exam Vitals reviewed.  Constitutional:      Appearance: Normal appearance. She is well-developed.  HENT:     Head: Normocephalic and atraumatic.  Eyes:     Conjunctiva/sclera: Conjunctivae normal.     Pupils: Pupils are equal, round, and reactive to light.  Neck:     Vascular: No carotid bruit.  Cardiovascular:     Rate and Rhythm: Normal rate and regular rhythm.     Heart sounds: Normal heart sounds.  Pulmonary:     Effort: Pulmonary effort is normal.     Breath sounds: Normal breath sounds.  Abdominal:     General: There is no distension.     Palpations: Abdomen is soft. There is no pulsatile mass.     Tenderness: There is no abdominal tenderness.  Musculoskeletal:  Right lower leg: No edema.     Left lower leg: No edema.  Skin:    General: Skin is warm and dry.  Neurological:     Mental Status: She is alert and oriented to person, place, and time.  Psychiatric:        Mood and Affect: Mood normal.        Behavior: Behavior normal.        Assessment & Plan:  Catherine Cole is a 67 y.o. female . Liver disease - Plan: MR Abdomen W Wo Contrast Liver lesion - Plan: MR Abdomen W Wo Contrast  -Prior  abnormality seen in October 2023, repeat imaging ordered with history of breast cancer.  Follow-up or specialty eval to be determined by imaging results.  Depression with anxiety  -Reports overall stable symptoms with Lexapro, BuSpar, and counseling, continue same for now.  Attention deficit hyperactivity disorder (ADHD), predominantly inattentive type - Plan: amphetamine-dextroamphetamine (ADDERALL XR) 10 MG 24 hr capsule  -Decreased control, will increase her Adderall XR slightly to 40 mg daily.  If persistent difficulty, would consider psychiatry evaluation to determine if medication changes or adjustment in plan given underlying depression/anxiety.  Hypokalemia - Plan: Basic metabolic panel  Low serum vitamin D - Plan: Vitamin D (25 hydroxy) Check labs and adjust supplementation accordingly  Essential hypertension, benign - Plan: Basic metabolic panel Stable, continue same management for now.  Meds ordered this encounter  Medications   amphetamine-dextroamphetamine (ADDERALL XR) 10 MG 24 hr capsule    Sig: Take 1 capsule (10 mg total) by mouth daily. Add to 30mg  - total dose 40mg  every day.    Dispense:  30 capsule    Refill:  0   Patient Instructions  Can try additional 10 mg of Adderall XR with your 30 mg for a total dose of 40 mg/day and recheck in 1 month.  No change in Lexapro BuSpar dosing for now.  Continue to meet with therapist.  I will check your electrolytes for the vitamin D, potassium and other electrolytes, no change in blood pressure medication for now.  I did order the repeat MRI for the 2 areas on the liver and will let you know if any concerns on the imaging.  Thank you for coming in today and take care.     Signed,   Meredith Staggers, MD [facility], [facility] 11/08/22 3:17 PM

## 2022-11-08 NOTE — Patient Instructions (Signed)
Can try additional 10 mg of Adderall XR with your 30 mg for a total dose of 40 mg/day and recheck in 1 month.  No change in Lexapro BuSpar dosing for now.  Continue to meet with therapist.  I will check your electrolytes for the vitamin D, potassium and other electrolytes, no change in blood pressure medication for now.  I did order the repeat MRI for the 2 areas on the liver and will let you know if any concerns on the imaging.  Thank you for coming in today and take care.

## 2022-11-09 LAB — BASIC METABOLIC PANEL
BUN: 18 mg/dL (ref 6–23)
CO2: 31 mEq/L (ref 19–32)
Calcium: 10.2 mg/dL (ref 8.4–10.5)
Chloride: 94 mEq/L — ABNORMAL LOW (ref 96–112)
Creatinine, Ser: 0.86 mg/dL (ref 0.40–1.20)
GFR: 70.27 mL/min (ref >60.00)
Glucose, Bld: 89 mg/dL (ref 70–99)
Potassium: 3.1 mEq/L — ABNORMAL LOW (ref 3.5–5.1)
Sodium: 137 mEq/L (ref 135–145)

## 2022-11-09 LAB — VITAMIN D 25 HYDROXY (VIT D DEFICIENCY, FRACTURES): VITD: 29.11 ng/mL — ABNORMAL LOW (ref 30.00–100.00)

## 2022-11-11 ENCOUNTER — Other Ambulatory Visit (HOSPITAL_COMMUNITY): Payer: Self-pay

## 2022-11-11 ENCOUNTER — Telehealth: Payer: Self-pay

## 2022-11-11 NOTE — Telephone Encounter (Signed)
Pharmacy Patient Advocate Encounter  Received notification from [facility] that Prior Authorization for Amphetamine-Dextroamphetamine ER 10 MG has been APPROVED from 11/10/22 to 11/09/25. Ran test claim, Copay is $--- (last filled 11/08/22).  PA #/Case ID/Reference #: [id number]

## 2022-11-12 ENCOUNTER — Ambulatory Visit: Payer: BC Managed Care – PPO | Admitting: Family Medicine

## 2022-11-12 ENCOUNTER — Encounter: Payer: Self-pay | Admitting: Family Medicine

## 2022-11-12 VITALS — BP 154/84 | HR 97 | Temp 98.8°F | Ht 66.0 in | Wt 194.6 lb

## 2022-11-12 DIAGNOSIS — U071 COVID-19: Secondary | ICD-10-CM | POA: Diagnosis not present

## 2022-11-12 DIAGNOSIS — E876 Hypokalemia: Secondary | ICD-10-CM

## 2022-11-12 DIAGNOSIS — R051 Acute cough: Secondary | ICD-10-CM | POA: Diagnosis not present

## 2022-11-12 LAB — POC COVID19 BINAXNOW: SARS Coronavirus 2 Ag: POSITIVE — AB

## 2022-11-12 MED ORDER — NIRMATRELVIR/RITONAVIR (PAXLOVID)TABLET
3.0000 | ORAL_TABLET | Freq: Two times a day (BID) | ORAL | 0 refills | Status: AC
Start: 2022-11-12 — End: 2022-11-17

## 2022-11-12 MED ORDER — PROMETHAZINE-DM 6.25-15 MG/5ML PO SYRP
2.5000 mL | ORAL_SOLUTION | Freq: Four times a day (QID) | ORAL | 0 refills | Status: DC | PRN
Start: 2022-11-12 — End: 2023-01-24

## 2022-11-12 NOTE — Patient Instructions (Addendum)
Mucinex or mucinex DM for cough.  Phenergan cough syrup if needed for more nausea or cough if needed. Continue to drink fluids, rest. Tylenol or occasional advil if needed for fever or bodyache.   I did order the antiviral Paxlovid if you would like to take that but you do have up to 5 days to start that medication.  It was sent to your pharmacy, you can let them know to place it on hold if you would not like to fill it just quite yet.  I expect your symptoms to be improving in the next few days.  I do recommend avoiding others as much as possible the first 5 days of illness and then after 5 days as long as you are improving and fever free (off of fever reducing medicines) could wear mask inside around others in close proximity for additional 5 days but considered to be less infectious/contagious at that time.  If any chest pains, shortness of breath at rest, confusion or other acute worsening symptoms be seen through urgent care or ER but I do not expect this to happen.  Hang in there.

## 2022-11-12 NOTE — Progress Notes (Signed)
Subjective:  Patient ID: Catherine Cole, female    DOB: April 03, 1956  Age: 66 y.o. MRN: [id number]  CC:  Chief Complaint  Patient presents with   Covid Exposure    Pt reports 2 hour meeting with full room on Tuesday where someone had tested positive the next day, dry cough, feels body aches, nausea     HPI Catherine Cole presents for above symptoms.  Patient with mask and I evaluated with mask during entirety of visit.  Cough, body aches, nausea Noted 2 days ago.  Was in a meeting day prior for similar and tested positive for COVID. Mild dry cough initially, worse cough yesterday.  Body aches. R ear ache. Dry cough. No vomiting. Nausea. Fever this morning - 101.  No dyspnea. No chest pain, no confusion.  Drinking water, gatorade. Some food intake yesterday.  Tx: sudafed last night. Able to sleep last night  No recent covid booster.   Planned retreat next Tuesday.   Antiviral discussed - including risks, side effects. Will consider antiviral - rx to pharmacy to fill in next few days if needed.  Immunization History  Administered Date(s) Administered   Fluad Quad(high Dose 65+) 01/11/2022   Influenza,inj,Quad PF,6+ Mos 12/15/2016, 04/04/2018, 12/12/2018, 04/03/2020, 01/29/2021   Influenza-Unspecified 02/14/2014, 02/11/2016   PFIZER Comirnaty(Gray Top)Covid-19 Tri-Sucrose Vaccine 05/27/2020   PFIZER(Purple Top)SARS-COV-2 Vaccination 12/27/2019   PNEUMOCOCCAL CONJUGATE-20 05/07/2022   Tdap 03/26/2014   Zoster Recombinant(Shingrix) 04/04/2020, 10/13/2020   GFR 70 on 11/08/22  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   Family history of ovarian cancer    Family history of breast cancer    Family history of colon cancer    Family history of  kidney cancer    Family history of melanoma    Vitamin D deficiency 12/12/2018   Essential hypertension, benign 10/15/2015   Kidney stone 05/02/2015   Palpitations 02/08/2015   Atypical chest pain 02/08/2015   Exertional dyspnea 02/08/2015   Lower extremity edema 02/08/2015   Mixed dyslipidemia 03/26/2014   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/03/2013   Depression with anxiety 04/26/2012   ADHD (attention deficit hyperactivity disorder) 04/26/2012   Past Medical History:  Diagnosis Date   Adult ADHD    Anemia    Anxiety    Cancer (HCC)    Phreesia 05/13/2020   Complication of anesthesia    Depression    Depression    Phreesia 05/13/2020   Family history of breast cancer    Family history of colon cancer    Family history of kidney cancer    Family history of melanoma    Family history of ovarian cancer    Hypertension    IBS (irritable bowel syndrome)    PONV (postoperative nausea and vomiting)    Past Surgical History:  Procedure Laterality Date   ABDOMINAL HYSTERECTOMY N/A    Phreesia 05/13/2020   APPENDECTOMY     12 yrs   BREAST SURGERY N/A    Phreesia 05/13/2020   CHOLECYSTECTOMY  1995   CHOLESTEATOMA EXCISION     13 and 22 yrs   FRACTURE SURGERY     KIDNEY STONE SURGERY  1995   LESION DESTRUCTION N/A 12/03/2015   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;  Surgeon: Alena Bills Dillingham, DO;  Location: [facility];  Service: Plastics;  Laterality: N/A;   MASTECTOMY W/ SENTINEL NODE BIOPSY Bilateral 07/25/2019   Procedure: BILATERAL MASTECTOMY WITH RIGHT SENTINEL LYMPH NODE BIOPSY, LEFT RISK REDUCING MASTECTOMY;  Surgeon: Harriette Bouillon, MD;  Location: [facility];  Service: General;  Laterality: Bilateral;  PEC BLOCK   TONSILLECTOMY     15 yrs   Allergies  Allergen Reactions   Latex Hives   Demerol Nausea And Vomiting   Codeine Nausea And Vomiting   Erythromycin Itching and Rash   Neosporin  [Neomycin-Polymyxin B Gu] Itching and Rash   Prior to Admission medications   Medication Sig Start Date End Date Taking? Authorizing Provider  amphetamine-dextroamphetamine (ADDERALL XR) 10 MG 24 hr capsule Take 1 capsule (10 mg total) by mouth daily. Add to 30mg  - total dose 40mg  every day. 11/08/22   Shade Flood, MD  amphetamine-dextroamphetamine (ADDERALL XR) 30 MG 24 hr capsule Take 1 capsule (30 mg total) by mouth every morning. 10/15/22   Shade Flood, MD  busPIRone (BUSPAR) 10 MG tablet TAKE 1-2 TABLETS BY MOUTH 2 TIMES DAILY. 06/07/22   Shade Flood, MD  chlorthalidone (HYGROTON) 25 MG tablet Take 1 tablet (25 mg total) by mouth daily. 05/07/22   Shade Flood, MD  escitalopram (LEXAPRO) 20 MG tablet Take 1 tablet (20 mg total) by mouth daily. 05/07/22   Shade Flood, MD  Incontinence Supply Disposable (DEPEND EASY FIT UNDERGARMENTS) MISC Use daily as directed. Change several times daily as needed. 06/29/18   Noni Saupe, MD  losartan (COZAAR) 50 MG tablet Take 1 tablet (50 mg total) by mouth daily. 05/07/22   Shade Flood, MD  potassium chloride (KLOR-CON) 10 MEQ tablet TAKE 2 TABLETS BY MOUTH EVERY DAY 06/07/22   Shade Flood, MD  valACYclovir (VALTREX) 500 MG tablet Take 2 tablets by mouth at acute onset for outbreak, followed by 1 tablet once a day x 5 days 01/11/20   Lezlie Lye, Meda Coffee, MD  Vitamin D, Ergocalciferol, (DRISDOL) 1.25 MG (50000 UNIT) CAPS capsule Take 1 capsule (50,000 Units total) by mouth every 7 (seven) days. 01/29/22   Shade Flood, MD   Social History   Socioeconomic History   Marital status: Widowed    Spouse name: Not on file   Number of children: Not on file   Years of education: Not on file   Highest education level: Not on file  Occupational History   Not on file  Tobacco Use   Smoking status: Former    Current packs/day: 1.00    Average packs/day: 1 pack/day for 7.0 years (7.0 ttl pk-yrs)    Types:  Cigarettes   Smokeless tobacco: Never   Tobacco comments:    some day smoker when she smoked  Substance and Sexual Activity   Alcohol use: No   Drug use: No   Sexual activity: Never  Other Topics Concern   Not on file  Social History Narrative   Not on file   Social Determinants of Health   Financial Resource Strain: Not on file  Food Insecurity: Not on file  Transportation Needs: Not on file  Physical Activity: Not on file  Stress: Not on file  Social Connections: Not on file  Intimate Partner Violence: Not on file    Review of Systems   Objective:   Vitals:   11/12/22 0856  BP: (!) 154/84  Pulse: 97  Temp: 98.8 F (37.1 C)  TempSrc: Temporal  SpO2: 98%  Weight: 194 lb 9.6 oz (88.3 kg)  Height: 5\' 6"  (1.676 m)     Physical Exam Vitals reviewed.  Constitutional:      General: She is not in acute distress.    Appearance: She is well-developed.  HENT:     Head: Normocephalic and atraumatic.     Right Ear: Hearing, tympanic membrane, ear canal and external ear normal.     Left Ear: Hearing, tympanic membrane, ear canal and external ear normal.     Nose: Nose normal.     Mouth/Throat:     Pharynx: No posterior oropharyngeal erythema.  Eyes:     Conjunctiva/sclera: Conjunctivae normal.     Pupils: Pupils are equal, round, and reactive to light.  Cardiovascular:     Rate and Rhythm: Normal rate and regular rhythm.     Heart sounds: Normal heart sounds. No murmur heard. Pulmonary:     Effort: Pulmonary effort is normal. No respiratory distress.     Breath sounds: Normal breath sounds. No wheezing or rhonchi.  Skin:    General: Skin is warm and dry.     Findings: No rash.  Neurological:     Mental Status: She is alert and oriented to person, place, and time.  Psychiatric:        Mood and Affect: Mood normal.        Behavior: Behavior normal.      Results for orders placed or performed in visit on 11/12/22  POC COVID-19  Result Value Ref Range    SARS Coronavirus 2 Ag Positive (A) Negative     Assessment & Plan:  Catherine Cole is a 67 y.o. female . Acute cough - Plan: POC COVID-19  COVID-19 virus infection - Plan: promethazine-dextromethorphan (PROMETHAZINE-DM) 6.25-15 MG/5ML syrup, nirmatrelvir/ritonavir (PAXLOVID) 20 x 150 MG & 10 x 100MG  TABS Day 2 of COVID-19 infection.  Vital signs reviewed, elevated blood pressure but coughing.  Otherwise reassuring.  Exam reassuring, no concerning symptoms at this time.  Appears appropriate for continued outpatient treatment.  Symptomatic care discussed with Mucinex or Mucinex DM, fluids, antipyretics as needed for fever, myalgias, and rest.  Phenergan DM if needed for cough or nausea.  We also discussed risk, benefits, side effects of antivirals.  She would like to consider that but not start at this time.  Sent to pharmacy, she can place on on hold if she would prefer, timing of that medicine within 5 days discussed.  Masking, isolation precautions and expected course discussed with ER precautions given.  Recent labs reviewed including borderline vitamin D, intermittent dosing of over-the-counter supplement previously.  She will take it daily and can recheck next visit.  Hypokalemia also discussed, currently taking 2 of the 10 mill equivalent potassium supplements, will increase to 4 today, then 3/day with recheck levels in 1 week.  Meds ordered this encounter  Medications   promethazine-dextromethorphan (PROMETHAZINE-DM) 6.25-15 MG/5ML syrup    Sig: Take 2.5-5 mLs by mouth 4 (four) times daily as needed for cough.    Dispense:  118 mL    Refill:  0   nirmatrelvir/ritonavir (PAXLOVID) 20 x 150 MG & 10 x 100MG  TABS    Sig: Take 3 tablets by mouth 2 (two) times daily for 5 days. (Take nirmatrelvir 150 mg two tablets twice daily for 5 days and ritonavir 100 mg one tablet twice daily for 5 days) Patient GFR is 70    Dispense:  30 tablet    Refill:  0   Patient Instructions  Mucinex or  mucinex DM for cough.  Phenergan cough syrup if needed for more nausea or cough if needed. Continue to drink fluids, rest. Tylenol or occasional advil if needed for fever or bodyache.   I did order the antiviral Paxlovid if you would like to take that but you do have up to 5 days to start that medication.  It was sent to your pharmacy, you can let them know to place it on hold if you would not like to fill it just quite yet.  I expect your symptoms to be improving in the next few days.  I do recommend avoiding others as much as possible the first 5 days of illness and then after 5 days as long as you are improving and fever free (off of fever reducing medicines) could wear mask inside around others in close proximity for additional 5 days but considered to be less infectious/contagious at that time.  If any chest pains, shortness of breath at rest, confusion or other acute worsening symptoms be seen through urgent care or ER but I do not expect this to happen.  Hang in there.     Signed,   Meredith Staggers, MD [facility], [facility] 11/12/22 9:49 AM

## 2022-11-16 ENCOUNTER — Other Ambulatory Visit: Payer: Self-pay | Admitting: Family Medicine

## 2022-11-16 MED ORDER — AMPHETAMINE-DEXTROAMPHET ER 30 MG PO CP24: 30.0000 mg | ORAL_CAPSULE | ORAL | 0 refills | Status: DC

## 2022-11-16 NOTE — Telephone Encounter (Signed)
Adderall XR 30 mg LOV: 11/12/22 Last Refill:10/15/22 Upcoming appt: 12/09/22

## 2022-11-16 NOTE — Telephone Encounter (Signed)
Patient is requesting a refill of the following medications: Requested Prescriptions   Pending Prescriptions Disp Refills   amphetamine-dextroamphetamine (ADDERALL XR) 30 MG 24 hr capsule 30 capsule 0    Sig: Take 1 capsule (30 mg total) by mouth every morning.    Date of patient request: 11/16/22 Last office visit: 11/12/22 Date of last refill: 10/15/22 Last refill amount: 30

## 2022-11-16 NOTE — Telephone Encounter (Signed)
Medication discussed at recent visit on 11/08/2022.  Controlled substance database reviewed, #30 last filled on 11/08/2022 of 10 mg caps.  Will reorder 30 mg caps #30 today.

## 2022-11-16 NOTE — Telephone Encounter (Signed)
Encourage patient to contact the pharmacy for refills or they can request refills through [facility]    WHAT PHARMACY WOULD THEY LIKE THIS SENT TO:  [id number] - [place],  - [address] AT [address]   MEDICATION NAME & DOSE: amphetamine-dextroamphetamine (ADDERALL XR) 30 MG 24 hr capsule   NOTES/COMMENTS FROM PATIENT: 10mg  was just filled ONLY the 30mg  needs to be filled to make th 40mg  Greene prescribed.       Front office please notify patient: It takes 48-72 hours to process rx refill requests Ask patient to call pharmacy to ensure rx is ready before heading there.

## 2022-11-17 ENCOUNTER — Encounter (INDEPENDENT_AMBULATORY_CARE_PROVIDER_SITE_OTHER): Payer: Self-pay

## 2022-11-17 NOTE — Telephone Encounter (Signed)
Left vm to let pt know this medication has been filled.

## 2022-11-19 ENCOUNTER — Encounter: Payer: Self-pay | Admitting: Family Medicine

## 2022-11-19 ENCOUNTER — Other Ambulatory Visit: Payer: Self-pay | Admitting: Family Medicine

## 2022-11-19 ENCOUNTER — Ambulatory Visit (INDEPENDENT_AMBULATORY_CARE_PROVIDER_SITE_OTHER): Payer: BC Managed Care – PPO | Admitting: Family Medicine

## 2022-11-19 VITALS — BP 122/68 | HR 81 | Temp 97.9°F | Ht 66.0 in | Wt 191.8 lb

## 2022-11-19 DIAGNOSIS — R7989 Other specified abnormal findings of blood chemistry: Secondary | ICD-10-CM

## 2022-11-19 DIAGNOSIS — R5383 Other fatigue: Secondary | ICD-10-CM | POA: Diagnosis not present

## 2022-11-19 DIAGNOSIS — R21 Rash and other nonspecific skin eruption: Secondary | ICD-10-CM

## 2022-11-19 DIAGNOSIS — U071 COVID-19: Secondary | ICD-10-CM | POA: Diagnosis not present

## 2022-11-19 DIAGNOSIS — R0981 Nasal congestion: Secondary | ICD-10-CM

## 2022-11-19 DIAGNOSIS — E876 Hypokalemia: Secondary | ICD-10-CM

## 2022-11-19 LAB — BASIC METABOLIC PANEL
BUN: 18 mg/dL (ref 6–23)
CO2: 30 mEq/L (ref 19–32)
Calcium: 9.7 mg/dL (ref 8.4–10.5)
Chloride: 93 mEq/L — ABNORMAL LOW (ref 96–112)
Creatinine, Ser: 0.91 mg/dL (ref 0.40–1.20)
GFR: 65.65 mL/min (ref >60.00)
Glucose, Bld: 123 mg/dL — ABNORMAL HIGH (ref 70–99)
Potassium: 3.2 mEq/L — ABNORMAL LOW (ref 3.5–5.1)
Sodium: 135 mEq/L (ref 135–145)

## 2022-11-19 MED ORDER — TRIAMCINOLONE ACETONIDE 0.1 % EX CREA
1.0000 | TOPICAL_CREAM | Freq: Two times a day (BID) | CUTANEOUS | 0 refills | Status: AC
Start: 2022-11-19 — End: ?

## 2022-11-19 MED ORDER — DOXYCYCLINE HYCLATE 100 MG PO TABS: 100.0000 mg | ORAL_TABLET | Freq: Two times a day (BID) | ORAL | 0 refills | Status: DC

## 2022-11-19 NOTE — Patient Instructions (Signed)
Glad to hear that some of your symptoms have improved but I think the fatigue is still related to your recent COVID-19 infection.  I do expect that to improve but may just take a little bit more time.  Make sure you are drinking plenty of fluids and resting as needed.  We can write you out from work today on Monday, okay to return to work next Tuesday as long as you are continuing to improve but please follow-up if you are still having difficulty next week.    I did write a prescription for an antibiotic for the discolored nasal discharge if that is not improving as that could be a sinus infection.  Without pain on exam today I think it is reasonable to watch those symptoms for another day or 2 and if they are improving you may not need the antibiotic.  Rash on leg could be due to insect bites, or other cause of itching rash.  Does not appear concerning at this time.  Okay to apply the steroid cream to itchy areas if needed but please be seen if any spread of rash or other new symptoms.  Continue potassium 3 pills/day for now, and I will check your levels today to decide on further changes.  Hang in there.   Return to the clinic or go to the nearest emergency room if any of your symptoms worsen or new symptoms occur.

## 2022-11-19 NOTE — Progress Notes (Signed)
Subjective:  Patient ID: Catherine Cole, female    DOB: February 26, 1956  Age: 66 y.o. MRN: [id number]  CC:  Chief Complaint  Patient presents with   Results    Pt here to discuss labs, notes she has been taking the requested potassium supplements and trying to eat really well hoping this would help her energy    Skin Problem    Pt has small bumps on her Rt & Lt legs that she notes are very itchy, redness and little scabby     HPI Catherine Cole presents for   Rash on legs Few days ago. Small red bumps on R knee area.moved to left leg with some itching on those small bumps. Cat at home. No fleas noted. No known mosquito bites. No other areas of rash. Just legs. No genital/mouth lesions.   Hypokalemia 3.6 in October, 3.3 in January, most recently 3.1.  Had been taking 20 mill equivalents per day, advised to increase to 40 mEq once, then 30 mEq daily, repeat testing today.  She does take chlorthalidone, losartan for hypertension. Reports she was inconsistent with potassium prior - 2-3 times per week, with potassium rich diet.  Now taking 3 per day - consistent dosing. Did have diarrhea with covid infection. Better now.   Vitamin D deficiency Low at 40 in January, recently 29 July 22.  Intermittent dosing of 1000 mg over-the-counter, recommended consistent dosing. Now taking daily.   Covid infection: Initial symptoms July 24, visit here with positive COVID-19 test on July 26.  Treated with Paxlovid, Phenergan DM if needed. Did not take paxlovid.  As above did have some diarrhea first few days, that has improved.  Still having persistent fatigue, green nasal discharge past week, without sinus pain, no recent fevers. Eating and drinking ok.   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   Family history of ovarian cancer    Family history of breast cancer    Family history of colon cancer    Family history of kidney cancer    Family history of melanoma    Vitamin D deficiency 12/12/2018   Essential hypertension, benign 10/15/2015   Kidney stone 05/02/2015   Palpitations 02/08/2015   Atypical chest pain 02/08/2015   Exertional dyspnea 02/08/2015   Lower extremity edema 02/08/2015   Mixed dyslipidemia 03/26/2014   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/03/2013   Depression with anxiety 04/26/2012   ADHD (attention deficit hyperactivity disorder) 04/26/2012   Past Medical History:  Diagnosis Date   Adult ADHD    Anemia    Anxiety    Cancer (HCC)    Phreesia 05/13/2020   Complication of anesthesia    Depression    Depression    Phreesia 05/13/2020   Family history of breast cancer    Family history of colon cancer    Family history of kidney cancer    Family history of melanoma    Family history of ovarian cancer    Hypertension    IBS (irritable bowel syndrome)    PONV (postoperative nausea and vomiting)    Past Surgical History:  Procedure Laterality Date   ABDOMINAL HYSTERECTOMY N/A    Phreesia 05/13/2020   APPENDECTOMY     12 yrs   BREAST SURGERY N/A  Phreesia 05/13/2020   CHOLECYSTECTOMY  1995   CHOLESTEATOMA EXCISION     13 and 22 yrs   FRACTURE SURGERY     KIDNEY STONE SURGERY  1995   LESION DESTRUCTION N/A 12/03/2015   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;  Surgeon: Peggye Form, DO;  Location: [facility];  Service: Plastics;  Laterality: N/A;   MASTECTOMY W/ SENTINEL NODE BIOPSY Bilateral 07/25/2019   Procedure: BILATERAL MASTECTOMY WITH RIGHT SENTINEL LYMPH NODE BIOPSY, LEFT RISK REDUCING MASTECTOMY;  Surgeon: Harriette Bouillon, MD;  Location: [facility];  Service: General;  Laterality: Bilateral;  PEC BLOCK   TONSILLECTOMY     15 yrs    Allergies  Allergen Reactions   Latex Hives   Demerol Nausea And Vomiting   Codeine Nausea And Vomiting   Erythromycin Itching and Rash   Neosporin [Neomycin-Polymyxin B Gu] Itching and Rash   Prior to Admission medications   Medication Sig Start Date End Date Taking? Authorizing Provider  amphetamine-dextroamphetamine (ADDERALL XR) 10 MG 24 hr capsule Take 1 capsule (10 mg total) by mouth daily. Add to 30mg  - total dose 40mg  every day. 11/08/22  Yes Shade Flood, MD  amphetamine-dextroamphetamine (ADDERALL XR) 30 MG 24 hr capsule Take 1 capsule (30 mg total) by mouth every morning. 11/16/22  Yes Shade Flood, MD  busPIRone (BUSPAR) 10 MG tablet TAKE 1-2 TABLETS BY MOUTH 2 TIMES DAILY. 06/07/22  Yes Shade Flood, MD  chlorthalidone (HYGROTON) 25 MG tablet Take 1 tablet (25 mg total) by mouth daily. 05/07/22  Yes Shade Flood, MD  escitalopram (LEXAPRO) 20 MG tablet Take 1 tablet (20 mg total) by mouth daily. 05/07/22  Yes Shade Flood, MD  Incontinence Supply Disposable (DEPEND EASY FIT UNDERGARMENTS) MISC Use daily as directed. Change several times daily as needed. 06/29/18  Yes Lezlie Lye, Meda Coffee, MD  losartan (COZAAR) 50 MG tablet Take 1 tablet (50 mg total) by mouth daily. 05/07/22  Yes Shade Flood, MD  potassium chloride (KLOR-CON) 10 MEQ tablet TAKE 2 TABLETS BY MOUTH EVERY DAY 06/07/22  Yes Shade Flood, MD  promethazine-dextromethorphan (PROMETHAZINE-DM) 6.25-15 MG/5ML syrup Take 2.5-5 mLs by mouth 4 (four) times daily as needed for cough. 11/12/22  Yes Shade Flood, MD  valACYclovir (VALTREX) 500 MG tablet Take 2 tablets by mouth at acute onset for outbreak, followed by 1 tablet once a day x 5 days 01/11/20  Yes Lezlie Lye, Meda Coffee, MD  Vitamin D, Ergocalciferol, (DRISDOL) 1.25 MG (50000 UNIT) CAPS capsule Take 1 capsule (50,000 Units total) by mouth every 7 (seven) days. 01/29/22  Yes Shade Flood, MD   Social History   Socioeconomic  History   Marital status: Widowed    Spouse name: Not on file   Number of children: Not on file   Years of education: Not on file   Highest education level: Not on file  Occupational History   Not on file  Tobacco Use   Smoking status: Former    Current packs/day: 1.00    Average packs/day: 1 pack/day for 7.0 years (7.0 ttl pk-yrs)    Types: Cigarettes   Smokeless tobacco: Never   Tobacco comments:    some day smoker when she smoked  Substance and Sexual Activity   Alcohol use: No   Drug use: No   Sexual activity: Never  Other Topics Concern   Not on file  Social History Narrative   Not on  file   Social Determinants of Health   Financial Resource Strain: Not on file  Food Insecurity: Not on file  Transportation Needs: Not on file  Physical Activity: Not on file  Stress: Not on file  Social Connections: Not on file  Intimate Partner Violence: Not on file    Review of Systems   Objective:   Vitals:   11/19/22 0846  BP: 122/68  Pulse: 81  Temp: 97.9 F (36.6 C)  TempSrc: Temporal  SpO2: 97%  Weight: 191 lb 12.8 oz (87 kg)  Height: 5\' 6"  (1.676 m)     Physical Exam Vitals reviewed.  Constitutional:      General: She is not in acute distress.    Appearance: She is well-developed.  HENT:     Head: Normocephalic and atraumatic.     Right Ear: Hearing, tympanic membrane, ear canal and external ear normal.     Left Ear: Hearing, tympanic membrane, ear canal and external ear normal.     Nose: Nose normal.     Comments: Sinuses nontender.    Mouth/Throat:     Pharynx: No posterior oropharyngeal erythema.  Eyes:     Conjunctiva/sclera: Conjunctivae normal.     Pupils: Pupils are equal, round, and reactive to light.  Cardiovascular:     Rate and Rhythm: Normal rate and regular rhythm.     Heart sounds: Normal heart sounds. No murmur heard. Pulmonary:     Effort: Pulmonary effort is normal. No respiratory distress.     Breath sounds: Normal breath sounds.  No wheezing or rhonchi.  Skin:    General: Skin is warm and dry.     Findings: No rash.  Neurological:     Mental Status: She is alert and oriented to person, place, and time.  Psychiatric:        Mood and Affect: Mood normal.        Behavior: Behavior normal.        Assessment & Plan:  Jenay Morici is a 67 y.o. female . COVID-19 virus infection Fatigue, unspecified type  -Overall improved but still within typical time of suspected fatigue, postviral fatigue with infection from COVID-19.  Continued rest, note provided for work, continue symptomatic care with RTC precautions/urgent care/ER precautions given.  Hypokalemia - Plan: Basic metabolic panel  -On further history above had not been adherent to daily use of potassium.  Will check levels on current dosing, and if she is adherent to daily dosing may be able to decrease back to previous 20 mEq daily.  Plan depending on lab results.  Low serum vitamin D  -Borderline previously, now on consistent use of vitamin D, can check with next labs.  Continue same plan for now.  Sinus congestion - Plan: doxycycline (VIBRA-TABS) 100 MG tablet  -Possible secondary sinusitis with COVID-19 infection as above, start doxycycline if not improving through the weekend but potential side effects, risks of antibiotics discussed.  Continue symptomatic care, rest, fluids.  RTC precautions  Rash and nonspecific skin eruption - Plan: triamcinolone cream (KENALOG) 0.1 %  -Few areas on bilateral lower legs, no petechial lesions, slight pruritic lesions, possible insect bites versus viral exanthem although no other affected areas.  Topical triamcinolone for symptomatic care for now with RTC/urgent care precautions.  Meds ordered this encounter  Medications   triamcinolone cream (KENALOG) 0.1 %    Sig: Apply 1 Application topically 2 (two) times daily. As needed to itchy rash    Dispense:  30 g  Refill:  0   doxycycline (VIBRA-TABS) 100 MG  tablet    Sig: Take 1 tablet (100 mg total) by mouth 2 (two) times daily.    Dispense:  20 tablet    Refill:  0   Patient Instructions  Glad to hear that some of your symptoms have improved but I think the fatigue is still related to your recent COVID-19 infection.  I do expect that to improve but may just take a little bit more time.  Make sure you are drinking plenty of fluids and resting as needed.  We can write you out from work today on Monday, okay to return to work next Tuesday as long as you are continuing to improve but please follow-up if you are still having difficulty next week.    I did write a prescription for an antibiotic for the discolored nasal discharge if that is not improving as that could be a sinus infection.  Without pain on exam today I think it is reasonable to watch those symptoms for another day or 2 and if they are improving you may not need the antibiotic.  Rash on leg could be due to insect bites, or other cause of itching rash.  Does not appear concerning at this time.  Okay to apply the steroid cream to itchy areas if needed but please be seen if any spread of rash or other new symptoms.  Continue potassium 3 pills/day for now, and I will check your levels today to decide on further changes.  Hang in there.   Return to the clinic or go to the nearest emergency room if any of your symptoms worsen or new symptoms occur.     Signed,   Meredith Staggers, MD [facility], [facility] 11/19/22 9:38 AM

## 2022-11-19 NOTE — Progress Notes (Signed)
See labs 

## 2022-11-24 ENCOUNTER — Ambulatory Visit
Admission: RE | Admit: 2022-11-24 | Discharge: 2022-11-24 | Disposition: A | Discharge status: Home / Self Care | Payer: BC Managed Care – PPO | Source: Ambulatory Visit | Attending: Family Medicine | Admitting: Family Medicine

## 2022-11-24 ENCOUNTER — Other Ambulatory Visit (INDEPENDENT_AMBULATORY_CARE_PROVIDER_SITE_OTHER): Payer: BC Managed Care – PPO

## 2022-11-24 DIAGNOSIS — E876 Hypokalemia: Secondary | ICD-10-CM

## 2022-11-24 DIAGNOSIS — K769 Liver disease, unspecified: Secondary | ICD-10-CM

## 2022-11-24 LAB — BASIC METABOLIC PANEL
BUN: 16 mg/dL (ref 6–23)
CO2: 31 mEq/L (ref 19–32)
Calcium: 10 mg/dL (ref 8.4–10.5)
Chloride: 96 mEq/L (ref 96–112)
Creatinine, Ser: 0.73 mg/dL (ref 0.40–1.20)
GFR: 85.52 mL/min (ref >60.00)
Glucose, Bld: 101 mg/dL — ABNORMAL HIGH (ref 70–99)
Potassium: 3.5 mEq/L (ref 3.5–5.1)
Sodium: 136 mEq/L (ref 135–145)

## 2022-11-24 MED ORDER — GADOPICLENOL 0.5 MMOL/ML IV SOLN
9.0000 mL | Freq: Once | INTRAVENOUS | Status: AC | PRN
  Administered 2022-11-24: 9 mL via INTRAVENOUS

## 2022-11-26 ENCOUNTER — Telehealth: Payer: Self-pay

## 2022-11-26 NOTE — Telephone Encounter (Signed)
Pt reports she started Doxycycline 11/25/2022 and she will go this weekend and see how she feels .  Pt will call Monday if not improved.

## 2022-11-26 NOTE — Telephone Encounter (Signed)
Noted.  Reasonable plan.

## 2022-11-26 NOTE — Telephone Encounter (Signed)
Please advise 

## 2022-11-26 NOTE — Telephone Encounter (Signed)
Please see MRI result note.  Doxycycline should be sufficient but if she is not improving, or any acute worsening should be seen to adjust regimen.  Either way should have some follow-up imaging for the MRI result note.  Let me know if there are questions and I can order that imaging once we reach patient with info.

## 2022-11-26 NOTE — Telephone Encounter (Signed)
-----   Message from Shade Flood sent at 11/25/2022  3:39 PM EDT ----- Results sent by MyChart, but please make sure she has seen those results and if there are any questions.  Once results discussed, can order lung CT in approximately 2 to 3 weeks to rule out chest nodules seen on MRI.  Let me know if there are questions.

## 2022-12-07 ENCOUNTER — Other Ambulatory Visit: Payer: Self-pay | Admitting: Family Medicine

## 2022-12-07 DIAGNOSIS — I10 Essential (primary) hypertension: Secondary | ICD-10-CM

## 2022-12-09 ENCOUNTER — Encounter: Payer: Self-pay | Admitting: Family Medicine

## 2022-12-09 ENCOUNTER — Ambulatory Visit: Payer: BC Managed Care – PPO | Admitting: Family Medicine

## 2022-12-09 VITALS — BP 134/82 | HR 95 | Temp 99.1°F | Ht 66.0 in | Wt 193.8 lb

## 2022-12-09 DIAGNOSIS — I10 Essential (primary) hypertension: Secondary | ICD-10-CM

## 2022-12-09 DIAGNOSIS — E559 Vitamin D deficiency, unspecified: Secondary | ICD-10-CM | POA: Diagnosis not present

## 2022-12-09 DIAGNOSIS — R911 Solitary pulmonary nodule: Secondary | ICD-10-CM

## 2022-12-09 DIAGNOSIS — F418 Other specified anxiety disorders: Secondary | ICD-10-CM

## 2022-12-09 DIAGNOSIS — U071 COVID-19: Secondary | ICD-10-CM | POA: Diagnosis not present

## 2022-12-09 DIAGNOSIS — R918 Other nonspecific abnormal finding of lung field: Secondary | ICD-10-CM

## 2022-12-09 DIAGNOSIS — R0981 Nasal congestion: Secondary | ICD-10-CM

## 2022-12-09 DIAGNOSIS — F900 Attention-deficit hyperactivity disorder, predominantly inattentive type: Secondary | ICD-10-CM

## 2022-12-09 DIAGNOSIS — R051 Acute cough: Secondary | ICD-10-CM | POA: Diagnosis not present

## 2022-12-09 MED ORDER — AMPHETAMINE-DEXTROAMPHET ER 10 MG PO CP24
10.0000 mg | ORAL_CAPSULE | Freq: Every day | ORAL | 0 refills | Status: DC
Start: 2022-12-09 — End: 2023-01-08

## 2022-12-09 NOTE — Progress Notes (Signed)
Subjective:  Patient ID: [name], female    DOB: July 17, 1955  Age: 67 y.o. MRN: [id number]  CC:  Chief Complaint  Patient presents with   Medical Management of Chronic Issues    HPI Amariyana Meineke presents for   Covid infection:  Treated without Paxlovid.  Some persistent fatigue, green nasal discharge when discussed August 2, possible secondary sinusitis.  Doxycycline prescribed - 1 more day. Min congestion, no further discolored phlegm.  Triamcinolone also prescribed at that time for pruritic rash, possible viral exanthem versus insect bites.rash resolved.  Feeling better - able to work at home some. Energy level improving. Chills in evening, not daily. Last had 2 days ago.  No recent temp above 100. Shortness of breath with walking to get garbage, not at rest. Drinking and eating ok. MRi noted abnormalities on 11/24/22: FINDINGS: Lower chest: Normal heart size without pericardial or pleural effusion. Areas of bibasilar ill-defined nodularity are suboptimally evaluated, including at up to 1.0 cm on 04/06. These are new since the prior.  Plan for CT to evaluate these areas.  Needs note to work at home, concerned about continued symptoms and being around crowds with current illness.   Low serum vitamin D Borderline reading previously, consistent use of vitamin D at her August 2 visit, plan on recheck today.   History of hypokalemia 3.2 on August 2, normalized on repeat testing August 7. Taking k daily.  Lab Results  Component Value Date   NA 136 11/24/2022   K 3.5 11/24/2022   CL 96 11/24/2022   CO2 31 11/24/2022   Depression with anxiety Treated with BuSpar 10-20mg  twice daily along with Lexapro 20 mg daily. Has been taking 2 buspar at night - feels like working ok. Has taken up to 4 at once - felt tired next days.   Last vitamin D Lab Results  Component Value Date   VD25OH 29.11 (L) 11/08/2022    Attention deficit disorder Treated with  Adderall XR, 30 mg, last filled on 11/16/2022.  Controlled substance database reviewed without concerns.  Decreased control discussed in July, option of additional 10 mg Adderall XR with her 30 mg for total dose of 40 mg. Feels better in 40mg  dose.   Hypertension: Chlorthalidone 25 mg daily, losartan 50 mg daily, potassium 20 mEq daily. Home readings: stable, good readings. No new side effects.  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   Family history of ovarian cancer    Family history of breast cancer    Family history of colon cancer    Family history of kidney cancer    Family history of melanoma    Vitamin D deficiency 12/12/2018   Essential hypertension, benign 10/15/2015   Kidney stone 05/02/2015   Palpitations 02/08/2015   Atypical chest pain 02/08/2015   Exertional dyspnea 02/08/2015   Lower extremity edema 02/08/2015   Mixed dyslipidemia 03/26/2014   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/03/2013   Depression with anxiety 04/26/2012   ADHD (attention deficit hyperactivity disorder) 04/26/2012   Past Medical History:  Diagnosis Date   Adult  ADHD    Anemia    Anxiety    Cancer (HCC)    Phreesia 05/13/2020   Complication of anesthesia    Depression    Depression    Phreesia 05/13/2020   Family history of breast cancer    Family history of colon cancer    Family history of kidney cancer    Family history of melanoma    Family history of ovarian cancer    Hypertension    IBS (irritable bowel syndrome)    PONV (postoperative nausea and vomiting)    Past Surgical History:   Procedure Laterality Date   ABDOMINAL HYSTERECTOMY N/A    Phreesia 05/13/2020   APPENDECTOMY     12 yrs   BREAST SURGERY N/A    Phreesia 05/13/2020   CHOLECYSTECTOMY  1995   CHOLESTEATOMA EXCISION     13 and 22 yrs   FRACTURE SURGERY     KIDNEY STONE SURGERY  1995   LESION DESTRUCTION N/A 12/03/2015   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;  Surgeon: Peggye Form, DO;  Location: [facility];  Service: Plastics;  Laterality: N/A;   MASTECTOMY W/ SENTINEL NODE BIOPSY Bilateral 07/25/2019   Procedure: BILATERAL MASTECTOMY WITH RIGHT SENTINEL LYMPH NODE BIOPSY, LEFT RISK REDUCING MASTECTOMY;  Surgeon: Harriette Bouillon, MD;  Location: [facility];  Service: General;  Laterality: Bilateral;  PEC BLOCK   TONSILLECTOMY     15 yrs   Allergies  Allergen Reactions   Latex Hives   Demerol Nausea And Vomiting   Codeine Nausea And Vomiting   Erythromycin Itching and Rash   Neosporin [Neomycin-Polymyxin B Gu] Itching and Rash   Prior to Admission medications   Medication Sig Start Date End Date Taking? Authorizing Provider  amphetamine-dextroamphetamine (ADDERALL XR) 10 MG 24 hr capsule Take 1 capsule (10 mg total) by mouth daily. Add to 30mg  - total dose 40mg  every day. 11/08/22  Yes Shade Flood, MD  amphetamine-dextroamphetamine (ADDERALL XR) 30 MG 24 hr capsule Take 1 capsule (30 mg total) by mouth every morning. 11/16/22  Yes Shade Flood, MD  busPIRone (BUSPAR) 10 MG tablet TAKE 1-2 TABLETS BY MOUTH 2 TIMES DAILY. 06/07/22  Yes Shade Flood, MD  chlorthalidone (HYGROTON) 25 MG tablet Take 1 tablet (25 mg total) by mouth daily. 05/07/22  Yes Shade Flood, MD  doxycycline (VIBRA-TABS) 100 MG tablet Take 1 tablet (100 mg total) by mouth 2 (two) times daily. 11/19/22  Yes Shade Flood, MD  escitalopram (LEXAPRO) 20 MG tablet Take 1 tablet (20 mg total) by mouth daily. 05/07/22  Yes Shade Flood, MD  Incontinence  Supply Disposable (DEPEND EASY FIT UNDERGARMENTS) MISC Use daily as directed. Change several times daily as needed. 06/29/18  Yes Lezlie Lye, Meda Coffee, MD  losartan (COZAAR) 50 MG tablet TAKE 1 TABLET BY MOUTH EVERY DAY 12/07/22  Yes Shade Flood, MD  potassium chloride (KLOR-CON) 10 MEQ tablet TAKE 2 TABLETS BY MOUTH EVERY DAY 06/07/22  Yes Shade Flood, MD  promethazine-dextromethorphan (PROMETHAZINE-DM) 6.25-15 MG/5ML syrup Take 2.5-5 mLs by mouth 4 (four) times daily as needed for cough. 11/12/22  Yes Shade Flood, MD  triamcinolone cream (KENALOG) 0.1 % Apply 1 Application topically 2 (two) times daily. As needed to itchy rash 11/19/22  Yes Shade Flood, MD  valACYclovir (VALTREX) 500 MG tablet Take 2 tablets by mouth at acute onset for outbreak, followed by 1 tablet once a day x  5 days 01/11/20  Yes Lezlie Lye, Meda Coffee, MD  Vitamin D, Ergocalciferol, (DRISDOL) 1.25 MG (50000 UNIT) CAPS capsule Take 1 capsule (50,000 Units total) by mouth every 7 (seven) days. 01/29/22  Yes Shade Flood, MD   Social History   Socioeconomic History   Marital status: Widowed    Spouse name: Not on file   Number of children: Not on file   Years of education: Not on file   Highest education level: Not on file  Occupational History   Not on file  Tobacco Use   Smoking status: Former    Current packs/day: 1.00    Average packs/day: 1 pack/day for 7.0 years (7.0 ttl pk-yrs)    Types: Cigarettes   Smokeless tobacco: Never   Tobacco comments:    some day smoker when she smoked  Substance and Sexual Activity   Alcohol use: No   Drug use: No   Sexual activity: Never  Other Topics Concern   Not on file  Social History Narrative   Not on file   Social Determinants of Health   Financial Resource Strain: Not on file  Food Insecurity: Not on file  Transportation Needs: Not on file  Physical Activity: Not on file  Stress: Not on file  Social Connections: Not on file  Intimate  Partner Violence: Not on file    Review of Systems Per HPI.   Objective:   Vitals:   12/09/22 1356  BP: 134/82  Pulse: 95  Temp: 99.1 F (37.3 C)  TempSrc: Oral  SpO2: 95%  Weight: 193 lb 12.8 oz (87.9 kg)  Height: 5\' 6"  (1.676 m)     Physical Exam Vitals reviewed.  Constitutional:      General: She is not in acute distress.    Appearance: Normal appearance. She is well-developed. She is not ill-appearing or diaphoretic.  HENT:     Head: Normocephalic and atraumatic.     Right Ear: Hearing, tympanic membrane, ear canal and external ear normal.     Left Ear: Hearing, tympanic membrane, ear canal and external ear normal.     Nose: Nose normal.     Mouth/Throat:     Pharynx: No posterior oropharyngeal erythema.  Eyes:     Conjunctiva/sclera: Conjunctivae normal.     Pupils: Pupils are equal, round, and reactive to light.  Neck:     Vascular: No carotid bruit.  Cardiovascular:     Rate and Rhythm: Normal rate and regular rhythm.     Heart sounds: Normal heart sounds. No murmur heard. Pulmonary:     Effort: Pulmonary effort is normal. No respiratory distress.     Breath sounds: Normal breath sounds. No wheezing or rhonchi.  Abdominal:     Palpations: Abdomen is soft. There is no pulsatile mass.     Tenderness: There is no abdominal tenderness.  Musculoskeletal:     Right lower leg: No edema.     Left lower leg: No edema.  Skin:    General: Skin is warm and dry.     Findings: No rash.  Neurological:     Mental Status: She is alert and oriented to person, place, and time.  Psychiatric:        Mood and Affect: Mood normal.        Behavior: Behavior normal.      40 minutes spent during visit, including chart review, discussion of cough, CT plan, medications for depression and dosing, counseling and assimilation of information, exam, discussion of  plan, and chart completion.    Assessment & Plan:  Nyara Border is a 67 y.o. female . COVID-19 virus  infection - Plan: CBC, CT CHEST WO CONTRAST  -Overall improving but persistent cough, fatigue, noted concerns above with return to in person work and I think it is reasonable for temporary remote work to allow further recovery.  Note provided.  Recheck 1 week.  Chest imaging as below for follow-up of previous abnormality, likely related to infection.  Attention deficit hyperactivity disorder (ADHD), predominantly inattentive type - Plan: amphetamine-dextroamphetamine (ADDERALL XR) 10 MG 24 hr capsule  -Overall stable at current dose of 40 mg Adderall XR daily, continue same.  Acute cough - Plan: CBC, CT CHEST WO CONTRAST  -As above with COVID infection, status post antibiotic for his symptoms, and bibasilar nodularity noted on prior imaging.  CT imaging ordered.  Essential hypertension, benign - Plan: Basic metabolic panel  -Stable with current regimen, check labs.  Depression with anxiety  -Discussed dosing of Lexapro and BuSpar including dosing intervals of BuSpar if needed.  No med changes otherwise for now.  Sinus congestion  -Plan as above.  Recheck 1 week.  Status post doxycycline.  Abnormal finding on lung imaging - Plan: CT CHEST WO CONTRAST Solitary pulmonary nodule - Plan: CT CHEST WO CONTRAST  -CT chest ordered given history of breast cancer but likely infectious/inflammatory with recent illness.  Vitamin D deficiency - Plan: Vitamin D (25 hydroxy) Continue supplementation, check levels now that she is consistently using vitamin D.  Meds ordered this encounter  Medications   amphetamine-dextroamphetamine (ADDERALL XR) 10 MG 24 hr capsule    Sig: Take 1 capsule (10 mg total) by mouth daily. Add to 30mg  - total dose 40mg  every day.    Dispense:  30 capsule    Refill:  0   Patient Instructions  Continue Lexapro 20 mg once per day, BuSpar 10 mg 1-2 of those up to twice per day but do not take more than 2 at a time.  I will check your electrolytes today and we will order the  CT scan of the chest.  No change in Adderall dose for now.  Blood pressure also appears to be stable at this time.  No other medication changes for now.  I did complete a letter for work, okay to work at home for now, take it easy, rest, and if any worsening symptoms prior to follow-up next week, please be seen.  Hang in there and thank you for coming in today.    Signed,   Meredith Staggers, MD [facility], [facility] 12/09/22 2:33 PM

## 2022-12-09 NOTE — Patient Instructions (Addendum)
Continue Lexapro 20 mg once per day, BuSpar 10 mg 1-2 of those up to twice per day but do not take more than 2 at a time.  I will check your electrolytes today and we will order the CT scan of the chest.  No change in Adderall dose for now.  Blood pressure also appears to be stable at this time.  No other medication changes for now.  I did complete a letter for work, okay to work at home for now, take it easy, rest, and if any worsening symptoms prior to follow-up next week, please be seen.  Hang in there and thank you for coming in today.

## 2022-12-10 ENCOUNTER — Ambulatory Visit
Admission: RE | Admit: 2022-12-10 | Discharge: 2022-12-10 | Disposition: A | Discharge status: Home / Self Care | Payer: BC Managed Care – PPO | Source: Ambulatory Visit | Attending: Family Medicine | Admitting: Family Medicine

## 2022-12-10 DIAGNOSIS — R911 Solitary pulmonary nodule: Secondary | ICD-10-CM

## 2022-12-10 DIAGNOSIS — R051 Acute cough: Secondary | ICD-10-CM

## 2022-12-10 DIAGNOSIS — R918 Other nonspecific abnormal finding of lung field: Secondary | ICD-10-CM

## 2022-12-10 DIAGNOSIS — U071 COVID-19: Secondary | ICD-10-CM

## 2022-12-10 LAB — BASIC METABOLIC PANEL
BUN: 19 mg/dL (ref 6–23)
CO2: 33 meq/L — ABNORMAL HIGH (ref 19–32)
Calcium: 10.2 mg/dL (ref 8.4–10.5)
Chloride: 96 meq/L (ref 96–112)
Creatinine, Ser: 0.89 mg/dL (ref 0.40–1.20)
GFR: 67.4 mL/min (ref >60.00)
Glucose, Bld: 94 mg/dL (ref 70–99)
Potassium: 4.3 meq/L (ref 3.5–5.1)
Sodium: 139 meq/L (ref 135–145)

## 2022-12-10 LAB — CBC
HCT: 42.9 % (ref 36.0–46.0)
Hemoglobin: 13.8 g/dL (ref 12.0–15.0)
MCHC: 32.3 g/dL (ref 30.0–36.0)
MCV: 90.1 fl (ref 78.0–100.0)
Platelets: 331 10*3/uL (ref 150.0–400.0)
RBC: 4.76 Mil/uL (ref 3.87–5.11)
RDW: 14.5 % (ref 11.5–15.5)
WBC: 9.8 10*3/uL (ref 4.0–10.5)

## 2022-12-10 LAB — VITAMIN D 25 HYDROXY (VIT D DEFICIENCY, FRACTURES): VITD: 30.58 ng/mL (ref 30.00–100.00)

## 2022-12-13 ENCOUNTER — Telehealth: Payer: Self-pay

## 2022-12-13 NOTE — Telephone Encounter (Signed)
-----   Message from Shade Flood sent at 12/11/2022  8:27 AM EDT ----- Results sent by MyChart, but please let me know if she has questions.

## 2022-12-16 ENCOUNTER — Ambulatory Visit (INDEPENDENT_AMBULATORY_CARE_PROVIDER_SITE_OTHER): Payer: BC Managed Care – PPO | Admitting: Family Medicine

## 2022-12-16 ENCOUNTER — Encounter: Payer: Self-pay | Admitting: Family Medicine

## 2022-12-16 ENCOUNTER — Telehealth: Payer: Self-pay | Admitting: Family Medicine

## 2022-12-16 ENCOUNTER — Telehealth: Payer: Self-pay

## 2022-12-16 VITALS — BP 128/78 | HR 98 | Temp 97.9°F | Ht 66.0 in | Wt 195.8 lb

## 2022-12-16 DIAGNOSIS — U071 COVID-19: Secondary | ICD-10-CM | POA: Diagnosis not present

## 2022-12-16 DIAGNOSIS — R051 Acute cough: Secondary | ICD-10-CM

## 2022-12-16 DIAGNOSIS — R918 Other nonspecific abnormal finding of lung field: Secondary | ICD-10-CM

## 2022-12-16 MED ORDER — BENZONATATE 100 MG PO CAPS
100.0000 mg | ORAL_CAPSULE | Freq: Three times a day (TID) | ORAL | 0 refills | Status: DC | PRN
Start: 2022-12-16 — End: 2023-01-28

## 2022-12-16 NOTE — Telephone Encounter (Signed)
Left lab results on pt VM 

## 2022-12-16 NOTE — Progress Notes (Signed)
Subjective:  Patient ID: Catherine Cole, female    DOB: [birth year]  Age: 67 y.o. MRN: [id number]  CC:  Chief Complaint  Patient presents with   Cough    Pt notes almost resolved, she has a little cough but much better    Results    Need to review labs and CT scan     HPI Catherine Cole presents for   Cough, COVID-19 infection. Follow-up from August 22.  Note provided for work due to her persistent cough at that time and concerned about continued symptoms and being around crowds with her current illness.  She was still having chills at that time, but overall improving.  Was completing doxycycline.  CT chest was ordered for previous abnormality on MRI, suspected abnormalities due to infection.  CBC, BMP, vitamin D overall reassuring. CT chest on 12/10/2022: IMPRESSION: 1. Scattered irregular ground-glass airspace opacities throughout the lung bases, nonspecific and infectious or inflammatory, although comparison to MR is very limited, these are likely improved and resolving when compared to examination dated 11/24/2022. 2. Emphysema and diffuse bilateral bronchial wall thickening. 3. Status post bilateral mastectomy. 4. Hepatic steatosis. Aortic Atherosclerosis (ICD10-I70.0) and Emphysema (ICD10-J43.9).  Since last visit:  Feeling better. Still some cough, but improving. Feels more normal since yesterday. No recent fever.  Plans to RTW next week - next Wednesday. Took personal time off work since last visit. On vacation until next Wednesday.  Tessalon worked in past.  No shortness of breath with activity today.    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   Family history of ovarian cancer    Family history of breast cancer    Family history  of colon cancer    Family history of kidney cancer    Family history of melanoma    Vitamin D deficiency 12/12/2018   Essential hypertension, benign 10/15/2015   Kidney stone 05/02/2015   Palpitations 02/08/2015   Atypical chest pain 02/08/2015   Exertional dyspnea 02/08/2015   Lower extremity edema 02/08/2015   Mixed dyslipidemia 03/26/2014   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/03/2013   Depression with anxiety 04/26/2012   ADHD (attention deficit hyperactivity disorder) 04/26/2012   Past Medical History:  Diagnosis Date   Adult ADHD    Anemia    Anxiety    Cancer (HCC)    Phreesia 05/13/2020   Complication of anesthesia    Depression    Depression    Phreesia 05/13/2020   Family history of breast cancer    Family history of colon cancer    Family history of kidney cancer    Family history of melanoma    Family history of ovarian cancer    Hypertension    IBS (irritable bowel syndrome)    PONV (postoperative nausea and vomiting)    Past Surgical History:  Procedure Laterality Date   ABDOMINAL HYSTERECTOMY N/A    Phreesia 05/13/2020   APPENDECTOMY     12 yrs   BREAST SURGERY N/A    Phreesia 05/13/2020   CHOLECYSTECTOMY  1995   CHOLESTEATOMA EXCISION     13 and 22 yrs   FRACTURE SURGERY     KIDNEY STONE SURGERY  1995   LESION DESTRUCTION N/A 12/03/2015   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;  Surgeon: Peggye Form, DO;  Location: [facility];  Service: Plastics;  Laterality: N/A;   MASTECTOMY W/ SENTINEL NODE BIOPSY Bilateral 07/25/2019   Procedure: BILATERAL MASTECTOMY WITH RIGHT SENTINEL LYMPH NODE BIOPSY, LEFT RISK REDUCING MASTECTOMY;  Surgeon: Harriette Bouillon, MD;  Location: [facility];  Service: General;  Laterality: Bilateral;  PEC BLOCK   TONSILLECTOMY     15 yrs   Allergies  Allergen Reactions   Latex Hives   Demerol Nausea And Vomiting   Codeine Nausea And Vomiting   Erythromycin  Itching and Rash   Neosporin [Neomycin-Polymyxin B Gu] Itching and Rash   Prior to Admission medications   Medication Sig Start Date End Date Taking? Authorizing Provider  amphetamine-dextroamphetamine (ADDERALL XR) 10 MG 24 hr capsule Take 1 capsule (10 mg total) by mouth daily. Add to 30mg  - total dose 40mg  every day. 12/09/22  Yes Shade Flood, MD  amphetamine-dextroamphetamine (ADDERALL XR) 30 MG 24 hr capsule Take 1 capsule (30 mg total) by mouth every morning. 11/16/22  Yes Shade Flood, MD  busPIRone (BUSPAR) 10 MG tablet TAKE 1-2 TABLETS BY MOUTH 2 TIMES DAILY. 06/07/22  Yes Shade Flood, MD  chlorthalidone (HYGROTON) 25 MG tablet Take 1 tablet (25 mg total) by mouth daily. 05/07/22  Yes Shade Flood, MD  doxycycline (VIBRA-TABS) 100 MG tablet Take 1 tablet (100 mg total) by mouth 2 (two) times daily. 11/19/22  Yes Shade Flood, MD  escitalopram (LEXAPRO) 20 MG tablet Take 1 tablet (20 mg total) by mouth daily. 05/07/22  Yes Shade Flood, MD  Incontinence Supply Disposable (DEPEND EASY FIT UNDERGARMENTS) MISC Use daily as directed. Change several times daily as needed. 06/29/18  Yes Lezlie Lye, Meda Coffee, MD  losartan (COZAAR) 50 MG tablet TAKE 1 TABLET BY MOUTH EVERY DAY 12/07/22  Yes Shade Flood, MD  potassium chloride (KLOR-CON) 10 MEQ tablet TAKE 2 TABLETS BY MOUTH EVERY DAY 06/07/22  Yes Shade Flood, MD  promethazine-dextromethorphan (PROMETHAZINE-DM) 6.25-15 MG/5ML syrup Take 2.5-5 mLs by mouth 4 (four) times daily as needed for cough. 11/12/22  Yes Shade Flood, MD  triamcinolone cream (KENALOG) 0.1 % Apply 1 Application topically 2 (two) times daily. As needed to itchy rash 11/19/22  Yes Shade Flood, MD  valACYclovir (VALTREX) 500 MG tablet Take 2 tablets by mouth at acute onset for outbreak, followed by 1 tablet once a day x 5 days 01/11/20  Yes Lezlie Lye, Meda Coffee, MD  Vitamin D, Ergocalciferol, (DRISDOL) 1.25 MG (50000 UNIT) CAPS capsule  Take 1 capsule (50,000 Units total) by mouth every 7 (seven) days. 01/29/22  Yes Shade Flood, MD   Social History   Socioeconomic History   Marital status: Widowed    Spouse name: Not on file   Number of children: Not on file   Years of education: Not on file   Highest education level: Not on file  Occupational History   Not on file  Tobacco Use   Smoking status: Former    Current packs/day: 1.00    Average packs/day: 1 pack/day for 7.0 years (7.0 ttl pk-yrs)    Types: Cigarettes   Smokeless tobacco: Never   Tobacco comments:    some day smoker when she smoked  Substance and Sexual Activity   Alcohol use: No   Drug use: No   Sexual activity: Never  Other Topics Concern   Not on file  Social History Narrative   Not on  file   Social Determinants of Health   Financial Resource Strain: Not on file  Food Insecurity: Not on file  Transportation Needs: Not on file  Physical Activity: Not on file  Stress: Not on file  Social Connections: Not on file  Intimate Partner Violence: Not on file    Review of Systems   Objective:   Vitals:   12/16/22 1455  BP: 128/78  Pulse: 98  Temp: 97.9 F (36.6 C)  TempSrc: Temporal  SpO2: 99%  Weight: 195 lb 12.8 oz (88.8 kg)  Height: 5\' 6"  (1.676 m)     Physical Exam Vitals reviewed.  Constitutional:      Appearance: Normal appearance. She is well-developed.  HENT:     Head: Normocephalic and atraumatic.  Eyes:     Conjunctiva/sclera: Conjunctivae normal.     Pupils: Pupils are equal, round, and reactive to light.  Neck:     Vascular: No carotid bruit.  Cardiovascular:     Rate and Rhythm: Normal rate and regular rhythm.     Heart sounds: Normal heart sounds.  Pulmonary:     Effort: Pulmonary effort is normal. No respiratory distress.     Breath sounds: Normal breath sounds. No wheezing, rhonchi or rales.  Abdominal:     Palpations: Abdomen is soft. There is no pulsatile mass.     Tenderness: There is no  abdominal tenderness.  Musculoskeletal:     Right lower leg: No edema.     Left lower leg: No edema.  Skin:    General: Skin is warm and dry.  Neurological:     Mental Status: She is alert and oriented to person, place, and time.  Psychiatric:        Mood and Affect: Mood normal.        Behavior: Behavior normal.        Assessment & Plan:  Catherine Cole is a 67 y.o. female . COVID-19 virus infection - Plan: benzonatate (TESSALON PERLES) 100 MG capsule, DG Chest 2 View  Acute cough - Plan: benzonatate (TESSALON PERLES) 100 MG capsule, DG Chest 2 View  Abnormal finding on lung imaging - Plan: benzonatate (TESSALON PERLES) 100 MG capsule, DG Chest 2 View  COVID-19 infection with some persistent cough but has improved significantly since last visit.  No further dyspnea.  Improving as above.  Tessalon Perles as needed for mild residual irritant cough, lungs were clear on exam.  Plan for repeat chest x-ray in approximately 4 weeks, follow-up 1 week later and then can decide if advanced imaging with CT needed given prior abnormalities.  RTC precautions if needed in the meantime, any new or worsening symptoms.  Meds ordered this encounter  Medications   benzonatate (TESSALON PERLES) 100 MG capsule    Sig: Take 1 capsule (100 mg total) by mouth 3 (three) times daily as needed for cough.    Dispense:  20 capsule    Refill:  0   Patient Instructions  Glad to hear you are improving.  Please have x-ray at the [facility] location below in about 4 weeks and then follow-up with me a week later.  Tessalon Perles if needed for some of the residual cough but expect that to continue to improve.  If any worsening of symptoms or new symptoms I am happy to see you sooner.  Take care!  [facility] or xray: Walk in 8:30-4:30 during weekdays, no appointment needed [address]  [place], [place] [address]  Signed,   Meredith Staggers, MD [facility], [facility] 12/16/22 4:01 PM

## 2022-12-16 NOTE — Patient Instructions (Addendum)
Glad to hear you are improving.  Please have x-ray at the [facility] location below in about 4 weeks and then follow-up with me a week later.  Tessalon Perles if needed for some of the residual cough but expect that to continue to improve.  If any worsening of symptoms or new symptoms I am happy to see you sooner.  Take care!  [facility] or xray: Walk in 8:30-4:30 during weekdays, no appointment needed [address]  [place], [place] [address]

## 2022-12-16 NOTE — Telephone Encounter (Signed)
-----   Message from Shade Flood sent at 12/16/2022  1:10 PM EDT ----- Results sent by MyChart, but appears patient has not yet reviewed those results.  Please call and make sure they have either seen note or discuss result note.  Thanks.

## 2022-12-16 NOTE — Telephone Encounter (Signed)
Pt dropped off in person paperwork from [employer] to be completed   Charge sheet attached placed in front bin

## 2022-12-17 NOTE — Telephone Encounter (Signed)
Placed in your sign folder  

## 2022-12-21 ENCOUNTER — Other Ambulatory Visit: Payer: Self-pay | Admitting: Family Medicine

## 2022-12-21 MED ORDER — AMPHETAMINE-DEXTROAMPHET ER 30 MG PO CP24: 30.0000 mg | ORAL_CAPSULE | ORAL | 0 refills | Status: DC

## 2022-12-21 NOTE — Telephone Encounter (Signed)
Medication discussed at August 22 visit.  Controlled substance database reviewed.  #30 filled on 12/09/2022 of the 10 mg, previously 30 mg on 11/16/2022.  Will refill the 30 mg.

## 2022-12-21 NOTE — Telephone Encounter (Signed)
Adderall XR 30 mg Requested Prescriptions   Pending Prescriptions Disp Refills   amphetamine-dextroamphetamine (ADDERALL XR) 30 MG 24 hr capsule 30 capsule 0    Sig: Take 1 capsule (30 mg total) by mouth every morning.     Date of patient request: 12/21/22 Last office visit: 12/16/22 Date of last refill: 11/16/22 Last refill amount: 30 Follow up time period per chart: AS scheduled

## 2022-12-29 NOTE — Telephone Encounter (Signed)
Let message for patient - will try to reach her again later today or tomorrow to review form for completion.

## 2022-12-29 NOTE — Telephone Encounter (Signed)
Pt called back trying to get ahold of Dr. Neva Seat. Pt was notified that Dr. Neva Seat will try to reach out to her again this afternoon or tomorrow to discuss the paperwork.

## 2022-12-29 NOTE — Telephone Encounter (Signed)
Pt did attempt a call back to you

## 2022-12-30 ENCOUNTER — Telehealth: Payer: Self-pay | Admitting: Family Medicine

## 2022-12-30 DIAGNOSIS — Z0279 Encounter for issue of other medical certificate: Secondary | ICD-10-CM

## 2022-12-30 NOTE — Telephone Encounter (Signed)
Pt reached out to try and catch you before the end of the day. She thought she missed your call.

## 2022-12-30 NOTE — Telephone Encounter (Signed)
Called patient, reviewed form and her responses, discussed diagnoses regarding her request for accommodations and completed based on her symptoms and discussion. She was in agreement with information provided. Paperwork completed and will be placed in fax bin at back nurse station for pick up in am.

## 2022-12-31 NOTE — Telephone Encounter (Signed)
Patient presented to the office, once signed pt was provided the original as well as a copy for her records, faxed the forms to billing with the charge sheet, will send to scan in one week

## 2022-12-31 NOTE — Telephone Encounter (Signed)
Called patient she will be coming into the office at approximately 9:30 am and she will sign the document, the office will make a copy for our records and then give the original to the patient for her to turn in.   Once we have the patients signature on the documents we do need to send document with Charge sheet attached to the billing department. Then the document should be sent to be scanned into the patients chart

## 2023-01-08 ENCOUNTER — Other Ambulatory Visit: Payer: Self-pay | Admitting: Family Medicine

## 2023-01-08 DIAGNOSIS — F900 Attention-deficit hyperactivity disorder, predominantly inattentive type: Secondary | ICD-10-CM

## 2023-01-09 ENCOUNTER — Other Ambulatory Visit: Payer: Self-pay | Admitting: Family Medicine

## 2023-01-09 DIAGNOSIS — F418 Other specified anxiety disorders: Secondary | ICD-10-CM

## 2023-01-10 NOTE — Telephone Encounter (Signed)
Last office visit 12/16/22 Last refill was 12/09/22

## 2023-01-10 NOTE — Telephone Encounter (Signed)
Patient is requesting a refill of the following medications: Requested Prescriptions   Pending Prescriptions Disp Refills   amphetamine-dextroamphetamine (ADDERALL XR) 10 MG 24 hr capsule 30 capsule 0    Sig: Take 1 capsule (10 mg total) by mouth daily. Add to 30mg  - total dose 40mg  every day.    Date of patient request: 01/10/23 Last office visit: 12/09/22 Date of last refill: 12/16/22 Last refill amount: 30 Follow up time period per chart: 6 months

## 2023-01-11 MED ORDER — AMPHETAMINE-DEXTROAMPHET ER 10 MG PO CP24
10.0000 mg | ORAL_CAPSULE | Freq: Every day | ORAL | 0 refills | Status: DC
Start: 2023-01-11 — End: 2023-02-17

## 2023-01-11 NOTE — Telephone Encounter (Signed)
Medication last discussed 12/09/2022.  Stable symptom control at 40 mg Adderall XR daily.  Controlled substance database reviewed.  #30 of extended release 30 mg filled on 12/21/2022, #30 of extended release 10 mg on 12/09/2022.  10 mg refill ordered.

## 2023-01-19 ENCOUNTER — Other Ambulatory Visit: Payer: Self-pay | Admitting: Family Medicine

## 2023-01-19 MED ORDER — AMPHETAMINE-DEXTROAMPHET ER 30 MG PO CP24: 30.0000 mg | ORAL_CAPSULE | ORAL | 0 refills | Status: DC

## 2023-01-19 NOTE — Telephone Encounter (Signed)
Encourage patient to contact the pharmacy for refills or they can request refills through [facility]  WHAT PHARMACY WOULD THEY LIKE THIS SENT TO:  [id number] - [place], [place] - [address] AT [address]    MEDICATION NAME & DOSE:  amphetamine-dextroamphetamine (ADDERALL XR) 30 MG 24 hr capsule   NOTES/COMMENTS FROM PATIENT: Pt is not understanding why refill was denied again     Front office please notify patient: It takes 48-72 hours to process rx refill requests Ask patient to call pharmacy to ensure rx is ready before heading there.

## 2023-01-19 NOTE — Telephone Encounter (Signed)
Medication discussed at August 22 visit.  Controlled substance database reviewed.  #30 last filled on 01/11/2023 of 10 mg, but 30 mg last filled September 3rd.  Refilled 30 mg prescription.

## 2023-01-19 NOTE — Telephone Encounter (Signed)
Patient is requesting a refill of the following medications: Requested Prescriptions   Pending Prescriptions Disp Refills   amphetamine-dextroamphetamine (ADDERALL XR) 30 MG 24 hr capsule 30 capsule 0    Sig: Take 1 capsule (30 mg total) by mouth every morning.    Date of patient request: 01/19/23 Last office visit: 12/16/22 Date of last refill: 12/21/22 Last refill amount: 30

## 2023-01-20 ENCOUNTER — Ambulatory Visit: Payer: BC Managed Care – PPO | Admitting: Family Medicine

## 2023-01-24 ENCOUNTER — Ambulatory Visit: Payer: BC Managed Care – PPO | Admitting: Family Medicine

## 2023-01-24 VITALS — BP 138/78 | HR 96 | Temp 97.8°F | Ht 66.0 in | Wt 199.2 lb

## 2023-01-24 DIAGNOSIS — R918 Other nonspecific abnormal finding of lung field: Secondary | ICD-10-CM

## 2023-01-24 DIAGNOSIS — U071 COVID-19: Secondary | ICD-10-CM

## 2023-01-24 DIAGNOSIS — R051 Acute cough: Secondary | ICD-10-CM

## 2023-01-24 NOTE — Progress Notes (Unsigned)
Subjective:  Patient ID: Catherine Cole, female    DOB: 03/24/1956  Age: 67 y.o. MRN: [id number]  CC:  Chief Complaint  Patient presents with   Cough    Pt notes cough is significantly improved note has not had CXR but would still like to do so     HPI Catherine Cole presents for   Follow-up cough.  See previous visit, persistent cough after initial COVID-19 infection.  Treated with doxycycline.  Follow-up CT chest performed due to previous abnormality on MRI, scattered irregular groundglass opacities in the airspaces were noted throughout the lung bases but those are nonspecific and infectious or inflammatory,  bronchial wall thickening.  Cough was improving at her August 29 visit, including improvement in her shortness of breath with activity.  Tessalon Perles were refilled.  Repeat chest x-ray appointment 4 weeks.    Cough has improved, not completely resolved. Notices when lying down at night and when warm.  No dyspnea. Fatigue improved. Feeling better.  Rare use for tessalon in evening - few days per week.  Has not had repeat CXR - able to have that done at this point.  5 more weeks until retirement.      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   Family history of ovarian cancer    Family history of breast cancer    Family history of colon cancer    Family history of kidney cancer    Family history of melanoma    Vitamin D deficiency 12/12/2018   Essential hypertension, benign 10/15/2015   Kidney stone 05/02/2015   Palpitations 02/08/2015   Atypical chest pain 02/08/2015   Exertional dyspnea 02/08/2015   Lower extremity edema 02/08/2015   Mixed dyslipidemia 03/26/2014   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/03/2013    Depression with anxiety 04/26/2012   ADHD (attention deficit hyperactivity disorder) 04/26/2012   Past Medical History:  Diagnosis Date   Adult ADHD    Anemia    Anxiety    Cancer (HCC)    Phreesia 05/13/2020   Complication of anesthesia    Depression    Depression    [employer]reesia 05/13/2020   Family history of breast cancer    Family history of colon cancer    Family history of kidney cancer    Family history of melanoma    Family history of ovarian cancer    Hypertension    IBS (irritable bowel syndrome)    PONV (postoperative nausea and vomiting)    Past Surgical History:  Procedure Laterality Date   ABDOMINAL HYSTERECTOMY N/A    Phreesia 05/13/2020   APPENDECTOMY     12 yrs   BREAST SURGERY N/A    Phreesia 05/13/2020   CHOLECYSTECTOMY  1995   CHOLESTEATOMA EXCISION     13 and 22 yrs   FRACTURE SURGERY     KIDNEY STONE SURGERY  1995   LESION DESTRUCTION N/A 12/03/2015   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;  Surgeon: Peggye Form, DO;  Location: [facility];  Service: Plastics;  Laterality: N/A;   MASTECTOMY W/ SENTINEL NODE BIOPSY Bilateral 07/25/2019   Procedure: BILATERAL MASTECTOMY WITH RIGHT SENTINEL LYMPH NODE BIOPSY, LEFT RISK REDUCING MASTECTOMY;  Surgeon: Harriette Bouillon, MD;  Location:  SURGERY CENTER;  Service: General;  Laterality: Bilateral;  PEC BLOCK   TONSILLECTOMY     15 yrs   Allergies  Allergen Reactions   Latex Hives   Demerol Nausea And Vomiting   Codeine Nausea And Vomiting   Erythromycin Itching and Rash   Neosporin [Neomycin-Polymyxin B Gu] Itching and Rash   Prior to Admission medications   Medication Sig Start Date End Date Taking? Authorizing Provider  amphetamine-dextroamphetamine (ADDERALL XR) 10 MG 24 hr capsule Take 1 capsule (10 mg total) by mouth daily. Add to 30mg  - total dose 40mg  every day. 01/11/23  Yes Shade Flood, MD  amphetamine-dextroamphetamine (ADDERALL XR) 30  MG 24 hr capsule Take 1 capsule (30 mg total) by mouth every morning. 01/19/23  Yes Shade Flood, MD  benzonatate (TESSALON PERLES) 100 MG capsule Take 1 capsule (100 mg total) by mouth 3 (three) times daily as needed for cough. 12/16/22  Yes Shade Flood, MD  busPIRone (BUSPAR) 10 MG tablet TAKE 1-2 TABLETS BY MOUTH 2 TIMES DAILY. 06/07/22  Yes Shade Flood, MD  chlorthalidone (HYGROTON) 25 MG tablet Take 1 tablet (25 mg total) by mouth daily. 05/07/22  Yes Shade Flood, MD  doxycycline (VIBRA-TABS) 100 MG tablet Take 1 tablet (100 mg total) by mouth 2 (two) times daily. 11/19/22  Yes Shade Flood, MD  escitalopram (LEXAPRO) 20 MG tablet TAKE 1 TABLET BY MOUTH EVERY DAY 01/10/23  Yes Shade Flood, MD  Incontinence Supply Disposable (DEPEND EASY FIT UNDERGARMENTS) MISC Use daily as directed. Change several times daily as needed. 06/29/18  Yes Lezlie Lye, Meda Coffee, MD  losartan (COZAAR) 50 MG tablet TAKE 1 TABLET BY MOUTH EVERY DAY 12/07/22  Yes Shade Flood, MD  potassium chloride (KLOR-CON) 10 MEQ tablet TAKE 2 TABLETS BY MOUTH EVERY DAY 06/07/22  Yes Shade Flood, MD  promethazine-dextromethorphan (PROMETHAZINE-DM) 6.25-15 MG/5ML syrup Take 2.5-5 mLs by mouth 4 (four) times daily as needed for cough. 11/12/22  Yes Shade Flood, MD  triamcinolone cream (KENALOG) 0.1 % Apply 1 Application topically 2 (two) times daily. As needed to itchy rash 11/19/22  Yes Shade Flood, MD  valACYclovir (VALTREX) 500 MG tablet Take 2 tablets by mouth at acute onset for outbreak, followed by 1 tablet once a day x 5 days 01/11/20  Yes Lezlie Lye, Meda Coffee, MD  Vitamin D, Ergocalciferol, (DRISDOL) 1.25 MG (50000 UNIT) CAPS capsule Take 1 capsule (50,000 Units total) by mouth every 7 (seven) days. 01/29/22  Yes Shade Flood, MD   Social History   Socioeconomic History   Marital status: Widowed    Spouse name: Not on file   Number of children: Not on file   Years of  education: Not on file   Highest education level: Not on file  Occupational History   Not on file  Tobacco Use   Smoking status: Former    Current packs/day: 1.00    Average packs/day: 1 pack/day for 7.0 years (7.0 ttl pk-yrs)    Types: Cigarettes   Smokeless tobacco: Never   Tobacco comments:    some day smoker when she smoked  Substance and Sexual Activity   Alcohol use: No   Drug use: No   Sexual activity: Never  Other Topics Concern   Not on file  Social History Narrative   Not on file   Social Determinants of Health   Financial Resource Strain: Not on file  Food Insecurity: Not on file  Transportation Needs: Not  on file  Physical Activity: Not on file  Stress: Not on file  Social Connections: Not on file  Intimate Partner Violence: Not on file    Review of Systems   Objective:   Vitals:   01/24/23 1609  BP: 138/78  Pulse: 96  Temp: 97.8 F (36.6 C)  TempSrc: Temporal  SpO2: 99%  Weight: 199 lb 3.2 oz (90.4 kg)  Height: 5\' 6"  (1.676 m)     Physical Exam Vitals reviewed.  Constitutional:      Appearance: Normal appearance. She is well-developed.  HENT:     Head: Normocephalic and atraumatic.  Eyes:     Conjunctiva/sclera: Conjunctivae normal.     Pupils: Pupils are equal, round, and reactive to light.  Neck:     Vascular: No carotid bruit.  Cardiovascular:     Rate and Rhythm: Normal rate and regular rhythm.     Heart sounds: Normal heart sounds.  Pulmonary:     Effort: Pulmonary effort is normal. No respiratory distress.     Breath sounds: Normal breath sounds. No wheezing, rhonchi or rales.  Abdominal:     Palpations: Abdomen is soft. There is no pulsatile mass.     Tenderness: There is no abdominal tenderness.  Musculoskeletal:     Right lower leg: No edema.     Left lower leg: No edema.  Skin:    General: Skin is warm and dry.  Neurological:     Mental Status: She is alert and oriented to person, place, and time.  Psychiatric:         Mood and Affect: Mood normal.        Behavior: Behavior normal.        Assessment & Plan:  Catherine Cole is a 67 y.o. female . Acute cough  COVID-19 virus infection  Abnormal finding on lung imaging Significantly improved symptoms with only minimal cough at this time, lungs were clear on exam.  Plan for repeat chest x-ray, and if any persistent abnormalities could consider repeat CT, or evaluation with pulmonology to decide on further testing or treatment.  RTC precautions given.  No orders of the defined types were placed in this encounter.  Patient Instructions  Glad to hear that you are better. Chest xray at [facility].  If cough not resolved in the next month, let me know and I can refer you to pulmonary to decide on other testing needed. Hang in there and good luck on the home stretch to retirement!  [facility] or xray: Walk in 8:30-4:30 during weekdays, no appointment needed [address]  [place], [place] [address]     Signed,   Meredith Staggers, MD [facility], [facility] 01/24/23 4:42 PM

## 2023-01-24 NOTE — Patient Instructions (Signed)
Glad to hear that you are better. Chest xray at [facility].  If cough not resolved in the next month, let me know and I can refer you to pulmonary to decide on other testing needed. Hang in there and good luck on the home stretch to retirement!  [facility] or xray: Walk in 8:30-4:30 during weekdays, no appointment needed [address]  [place], [place] [address]

## 2023-01-25 ENCOUNTER — Encounter: Payer: Self-pay | Admitting: Family Medicine

## 2023-01-28 ENCOUNTER — Other Ambulatory Visit: Payer: Self-pay | Admitting: Family Medicine

## 2023-01-28 ENCOUNTER — Other Ambulatory Visit: Payer: Self-pay

## 2023-01-28 DIAGNOSIS — U071 COVID-19: Secondary | ICD-10-CM

## 2023-01-28 DIAGNOSIS — I10 Essential (primary) hypertension: Secondary | ICD-10-CM

## 2023-01-28 DIAGNOSIS — R918 Other nonspecific abnormal finding of lung field: Secondary | ICD-10-CM

## 2023-01-28 DIAGNOSIS — R051 Acute cough: Secondary | ICD-10-CM

## 2023-02-17 ENCOUNTER — Other Ambulatory Visit: Payer: Self-pay | Admitting: Family Medicine

## 2023-02-17 DIAGNOSIS — F900 Attention-deficit hyperactivity disorder, predominantly inattentive type: Secondary | ICD-10-CM

## 2023-02-17 MED ORDER — AMPHETAMINE-DEXTROAMPHET ER 10 MG PO CP24
10.0000 mg | ORAL_CAPSULE | Freq: Every day | ORAL | 0 refills | Status: DC
Start: 2023-02-17 — End: 2023-04-06

## 2023-02-17 MED ORDER — AMPHETAMINE-DEXTROAMPHET ER 30 MG PO CP24: 30.0000 mg | ORAL_CAPSULE | ORAL | 0 refills | Status: DC

## 2023-02-17 NOTE — Telephone Encounter (Signed)
Requested Prescriptions  Pending Prescriptions Disp Refills  amphetamine-dextroamphetamine (ADDERALL XR) 10 MG 24 hr capsule 30 capsule 0   Sig: Take 1 capsule (10 mg total) by mouth daily. Add to 30mg  - total dose 40mg  every day.  amphetamine-dextroamphetamine (ADDERALL XR) 30 MG 24 hr capsule 30 capsule 0   Sig: Take 1 capsule (30 mg total) by mouth every morning.    Last office visit 01/24/2023 Last refill 01/11/2023 30 day

## 2023-02-17 NOTE — Telephone Encounter (Signed)
Medication discussed in August.  Controlled substance database reviewed.  30 mg capsule #30 last filled 01/19/23, 10 mg #30 on 01/11/23.  Refills ordered.

## 2023-02-25 ENCOUNTER — Ambulatory Visit (INDEPENDENT_AMBULATORY_CARE_PROVIDER_SITE_OTHER)
Admission: RE | Admit: 2023-02-25 | Discharge: 2023-02-25 | Disposition: A | Discharge status: Home / Self Care | Payer: BC Managed Care – PPO | Source: Ambulatory Visit | Attending: Family Medicine | Admitting: Family Medicine

## 2023-02-25 DIAGNOSIS — R051 Acute cough: Secondary | ICD-10-CM

## 2023-02-25 DIAGNOSIS — U071 COVID-19: Secondary | ICD-10-CM | POA: Diagnosis not present

## 2023-02-25 DIAGNOSIS — R918 Other nonspecific abnormal finding of lung field: Secondary | ICD-10-CM | POA: Diagnosis not present

## 2023-03-16 ENCOUNTER — Other Ambulatory Visit: Payer: Self-pay | Admitting: Family Medicine

## 2023-03-16 MED ORDER — AMPHETAMINE-DEXTROAMPHET ER 30 MG PO CP24: 30.0000 mg | ORAL_CAPSULE | ORAL | 0 refills | Status: DC

## 2023-03-16 NOTE — Telephone Encounter (Signed)
.    Controlled substance database reviewed.  #30 filled on 02/17/2023.  Medication discussed in August.  Refill ordered.

## 2023-03-16 NOTE — Telephone Encounter (Signed)
Requested Prescriptions   Pending Prescriptions Disp Refills   amphetamine-dextroamphetamine (ADDERALL XR) 30 MG 24 hr capsule 30 capsule 0    Sig: Take 1 capsule (30 mg total) by mouth every morning.     Date of patient request: 03/16/2023 Last office visit: 01/24/2023 Upcoming visit: 03/25/2023 Date of last refill: 02/17/2023 Last refill amount: 30 0 refills

## 2023-03-16 NOTE — Telephone Encounter (Signed)
Last office visit 01/24/2023 Last refill 02/17/2023 30 tablets

## 2023-03-25 ENCOUNTER — Ambulatory Visit: Payer: BC Managed Care – PPO | Admitting: Family Medicine

## 2023-04-06 ENCOUNTER — Ambulatory Visit: Payer: BC Managed Care – PPO | Admitting: Family Medicine

## 2023-04-06 VITALS — BP 130/78 | HR 84 | Temp 98.1°F | Ht 66.0 in | Wt 195.2 lb

## 2023-04-06 DIAGNOSIS — E559 Vitamin D deficiency, unspecified: Secondary | ICD-10-CM

## 2023-04-06 DIAGNOSIS — F418 Other specified anxiety disorders: Secondary | ICD-10-CM

## 2023-04-06 DIAGNOSIS — E876 Hypokalemia: Secondary | ICD-10-CM | POA: Diagnosis not present

## 2023-04-06 DIAGNOSIS — R7309 Other abnormal glucose: Secondary | ICD-10-CM

## 2023-04-06 DIAGNOSIS — Z13220 Encounter for screening for lipoid disorders: Secondary | ICD-10-CM

## 2023-04-06 DIAGNOSIS — M7989 Other specified soft tissue disorders: Secondary | ICD-10-CM

## 2023-04-06 DIAGNOSIS — M25561 Pain in right knee: Secondary | ICD-10-CM

## 2023-04-06 DIAGNOSIS — K760 Fatty (change of) liver, not elsewhere classified: Secondary | ICD-10-CM | POA: Diagnosis not present

## 2023-04-06 DIAGNOSIS — Z23 Encounter for immunization: Secondary | ICD-10-CM

## 2023-04-06 DIAGNOSIS — F900 Attention-deficit hyperactivity disorder, predominantly inattentive type: Secondary | ICD-10-CM | POA: Diagnosis not present

## 2023-04-06 DIAGNOSIS — I10 Essential (primary) hypertension: Secondary | ICD-10-CM

## 2023-04-06 MED ORDER — DICLOFENAC SODIUM 1 % EX CREA: 4.0000 g | TOPICAL_CREAM | Freq: Four times a day (QID) | CUTANEOUS | 2 refills | Status: AC | PRN

## 2023-04-06 MED ORDER — AMPHETAMINE-DEXTROAMPHET ER 10 MG PO CP24
10.0000 mg | ORAL_CAPSULE | Freq: Every day | ORAL | 0 refills | Status: DC
Start: 2023-04-06 — End: 2023-05-18

## 2023-04-06 MED ORDER — POTASSIUM CHLORIDE ER 10 MEQ PO TBCR: 20.0000 meq | EXTENDED_RELEASE_TABLET | Freq: Every day | ORAL | 1 refills | Status: DC

## 2023-04-06 MED ORDER — ESCITALOPRAM OXALATE 20 MG PO TABS: 20.0000 mg | ORAL_TABLET | Freq: Every day | ORAL | 0 refills | Status: DC

## 2023-04-06 MED ORDER — BUSPIRONE HCL 10 MG PO TABS: ORAL_TABLET | ORAL | 1 refills | Status: DC

## 2023-04-06 NOTE — Patient Instructions (Signed)
I will check labs today and let you know if there are any concerns including liver tests but I expect those to be okay. No medication changes for now.  Once you retire I think returning to just 30 mg of Adderall XR would be an initial first start, and then we can taper back from there if needed.  We can discuss this further at follow-up in the next 6 weeks.  I did send in the 10 mg dose today, and request your refill of the 30 mg dose from your pharmacy when that refill is closer to being due. Restart potassium supplement, but I will check those levels today. Based on last vitamin D level, okay to continue 2000 units daily. Blood pressure is stable today. No change in BuSpar or Lexapro for today.  I expect depression and anxiety symptoms to improve further once you have retired.  Congratulations on the retirement! I am glad to hear that the leg swelling and pain has improved.  Okay to use diclofenac gel 3-4 times per day if needed but follow-up if that does not continue to improve in the next week or 2. If any new or worsening symptoms be seen right away. Take care!

## 2023-04-06 NOTE — Progress Notes (Signed)
Subjective:  Patient ID: Catherine Cole, female    DOB: Aug 03, 1955  Age: 67 y.o. MRN: [id number]  CC:  Chief Complaint  Patient presents with   Medical Management of Chronic Issues    Pt is doing well, requesting diclofenac sodium cream    HPI [facility]epa presents   R leg swelling: Noted when back and forth to the hospital for mother after a fall. 2 weeks ago. Ache in upper and lower leg. No fall/injury. Had lymphedema in past on the R side but not to this degree. Not able to use lymphedema pump at that time. Off the pump for past 2 weeks - due to time. Is elevating at night.  Sore in upper and lower leg, but in front. Knee up and down on R side.  No hx of DVT.  No recent prolonged car travel or air travel.  Swelling is improving with elevation and pain improves with use of diclofenac 1% TID topical.  No chest pain/dyspnea.   Cough Last visit in October, persistent cough after initial COVID-19 infection, treated with doxycycline, previous abnormality on MRI, CT chest with scattered irregular groundglass opacities throughout the lung bases but nonspecific and infectious or inflammatory with bronchial wall thickening.  Cough improved but not completely resolved at her October 7 visit.  Repeat chest x-ray November 8 with no active cardiopulmonary disease.  Lungs were clear. Cough has resolved.   Hepatic steatosis Noted on previous imaging including MRI in August, with stable appearance of multiple foci of arterial less so portal venous phase hepatic hyperenhancement, stability for approximately 9 months favored benign etiology, most likely areas of focal nodular hyperplasia.  No recent LFTs. Lab Results  Component Value Date   ALT 28 01/11/2020   AST 21 01/11/2020   ALKPHOS 85 01/11/2020   BILITOT 0.3 01/11/2020   Low serum vitamin D Discussed in August.  30.5 at that time.  Prior prescription strength dosing.  Currently taking daily 2000iu.  Last vitamin D Lab  Results  Component Value Date   VD25OH 30.58 12/09/2022   History of hypokalemia Also discussed in August, improved on repeat testing after initial level of 3.2, treated with 20 mg his potassium supplement daily. Off potassium past 2 weeks - left at home.  Lab Results  Component Value Date   K 4.3 12/09/2022   Attention deficit disorder Treated with Adderall XR total dose of 40 mg daily.  Had been decreased control and dosage was increased from 30 mg/day to 40 mg/day in July with improved symptoms on follow-up visits. Controlled substance database reviewed.  #30 of the 30 mg last filled on 03/18/2023, previously #30 on 02/17/2023 with 10 mg #30 also on 02/17/2023. Currently taking 30mg  every day of adderall XR. Additional 10mg  most days, not weekends. More effective on full 40mg  No insomnia, appetite changes or heart palpitations.  Plans to cut back once retired.   Hypertension: Chlorthalidone 25 mg daily, losartan 50 mg daily and potassium 20 Meq as above. Home readings: none.  BP Readings from Last 3 Encounters:  04/06/23 130/78  01/24/23 138/78  12/16/22 128/78   Lab Results  Component Value Date   CREATININE 0.89 12/09/2022   Lab Results  Component Value Date   HGBA1C 5.8 10/16/2020    Depression with anxiety Treated with BuSpar 10 to 20 mg twice daily along with Lexapro 20 mg daily.  Had been taking BuSpar 2 at night at her August 22 visit which had been working okay.  Stressed with work, but retirement planned soon when last discussed.  Still taking 2 buspar per day - spreading out dosing.  Feels like doing well.  Had retirement celebration, but officially retiring on 04/19/23.  Mother fell weak before Thanksgiving, had surgery - home since Friday. Doing better - working with OT.     04/06/2023   11:55 AM 01/24/2023    4:08 PM 11/08/2022    2:18 PM 05/07/2022   11:30 AM 01/29/2022   11:45 AM  Depression screen PHQ 2/9  Decreased Interest 1 1  0 0  Down, Depressed,  Hopeless 0 0 0 0 0  PHQ - 2 Score 1 1 0 0 0  Altered sleeping 1 2 1 1  0  Tired, decreased energy 1 1 0 0 0  Change in appetite 1 1 1  0 0  Feeling bad or failure about yourself  0 1 0 0 0  Trouble concentrating 0 0 1 1 0  Moving slowly or fidgety/restless 0 0 1 0 0  Suicidal thoughts 0  0 0 0  PHQ-9 Score 4 6 4 2  0      04/06/2023   11:56 AM 01/24/2023    4:08 PM 11/08/2022    2:19 PM 01/11/2022    1:57 PM  GAD 7 : Generalized Anxiety Score  Nervous, Anxious, on Edge 1 1 1 1   Control/stop worrying  0 0 0  Worry too much - different things 1 1 1 1   Trouble relaxing 0 1 0 2  Restless 0 0 0 0  Easily annoyed or irritable 1 1 1  0  Afraid - awful might happen 0 0 0 0  Total GAD 7 Score  4 3 4        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   Family history of ovarian cancer    Family history of breast cancer    Family history of colon cancer    Family history of kidney cancer    Family history of melanoma    Vitamin D deficiency 12/12/2018   Essential hypertension, benign 10/15/2015   Kidney stone 05/02/2015   Palpitations 02/08/2015   Atypical chest pain 02/08/2015   Exertional dyspnea 02/08/2015   Lower extremity edema 02/08/2015   Mixed dyslipidemia 03/26/2014   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/03/2013   Depression with anxiety 04/26/2012   ADHD (attention deficit hyperactivity disorder) 04/26/2012   Past Medical History:  Diagnosis Date   Adult ADHD    Anemia    Anxiety    Cancer (HCC)    Phreesia 05/13/2020   Complication of anesthesia    Depression    Depression    [employer]reesia 05/13/2020   Family history of breast cancer    Family history of colon cancer    Family history of kidney cancer    Family history of melanoma    Family  history of ovarian cancer    Hypertension    IBS (irritable bowel syndrome)    PONV (postoperative nausea and vomiting)    Past Surgical History:  Procedure Laterality Date   ABDOMINAL HYSTERECTOMY N/A    Phreesia 05/13/2020   APPENDECTOMY     12 yrs   BREAST SURGERY N/A    Phreesia 05/13/2020   CHOLECYSTECTOMY  1995   CHOLESTEATOMA EXCISION  13 and 22 yrs   FRACTURE SURGERY     KIDNEY STONE SURGERY  1995   LESION DESTRUCTION N/A 12/03/2015   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;  Surgeon: Peggye Form, DO;  Location: [facility];  Service: Plastics;  Laterality: N/A;   MASTECTOMY W/ SENTINEL NODE BIOPSY Bilateral 07/25/2019   Procedure: BILATERAL MASTECTOMY WITH RIGHT SENTINEL LYMPH NODE BIOPSY, LEFT RISK REDUCING MASTECTOMY;  Surgeon: Harriette Bouillon, MD;  Location: [facility];  Service: General;  Laterality: Bilateral;  PEC BLOCK   TONSILLECTOMY     15 yrs   Allergies  Allergen Reactions   Latex Hives   Demerol Nausea And Vomiting   Codeine Nausea And Vomiting   Erythromycin Itching and Rash   Neosporin [Neomycin-Polymyxin B Gu] Itching and Rash   Prior to Admission medications   Medication Sig Start Date End Date Taking? Authorizing Provider  amphetamine-dextroamphetamine (ADDERALL XR) 10 MG 24 hr capsule Take 1 capsule (10 mg total) by mouth daily. Add to 30mg  - total dose 40mg  every day. 02/17/23  Yes Shade Flood, MD  amphetamine-dextroamphetamine (ADDERALL XR) 30 MG 24 hr capsule Take 1 capsule (30 mg total) by mouth every morning. 03/16/23  Yes Shade Flood, MD  busPIRone (BUSPAR) 10 MG tablet TAKE 1-2 TABLETS BY MOUTH 2 TIMES DAILY. 06/07/22  Yes Shade Flood, MD  chlorthalidone (HYGROTON) 25 MG tablet TAKE 1 TABLET (25 MG TOTAL) BY MOUTH DAILY. 01/28/23  Yes Shade Flood, MD  escitalopram (LEXAPRO) 20 MG tablet TAKE 1 TABLET BY MOUTH EVERY DAY 01/10/23  Yes Shade Flood, MD   Incontinence Supply Disposable (DEPEND EASY FIT UNDERGARMENTS) MISC Use daily as directed. Change several times daily as needed. 06/29/18  Yes Lezlie Lye, Meda Coffee, MD  losartan (COZAAR) 50 MG tablet TAKE 1 TABLET BY MOUTH EVERY DAY 12/07/22  Yes Shade Flood, MD  potassium chloride (KLOR-CON) 10 MEQ tablet TAKE 2 TABLETS BY MOUTH EVERY DAY 06/07/22  Yes Shade Flood, MD  triamcinolone cream (KENALOG) 0.1 % Apply 1 Application topically 2 (two) times daily. As needed to itchy rash 11/19/22  Yes Shade Flood, MD  valACYclovir (VALTREX) 500 MG tablet Take 2 tablets by mouth at acute onset for outbreak, followed by 1 tablet once a day x 5 days 01/11/20  Yes Lezlie Lye, Meda Coffee, MD  Vitamin D, Ergocalciferol, (DRISDOL) 1.25 MG (50000 UNIT) CAPS capsule Take 1 capsule (50,000 Units total) by mouth every 7 (seven) days. 01/29/22  Yes Shade Flood, MD   Social History   Socioeconomic History   Marital status: Widowed    Spouse name: Not on file   Number of children: Not on file   Years of education: Not on file   Highest education level: Not on file  Occupational History   Not on file  Tobacco Use   Smoking status: Former    Current packs/day: 1.00    Average packs/day: 1 pack/day for 7.0 years (7.0 ttl pk-yrs)    Types: Cigarettes   Smokeless tobacco: Never   Tobacco comments:    some day smoker when she smoked  Substance and Sexual Activity   Alcohol use: No   Drug use: No   Sexual activity: Never  Other Topics Concern   Not on file  Social History Narrative   Not on file   Social Drivers of Health   Financial Resource Strain: Not on file  Food Insecurity: Not on  file  Transportation Needs: Not on file  Physical Activity: Not on file  Stress: Not on file  Social Connections: Not on file  Intimate Partner Violence: Not on file    Review of Systems   Objective:   Vitals:   04/06/23 1157  BP: 130/78  Pulse: 84  Temp: 98.1 F (36.7 C)  TempSrc:  Temporal  SpO2: 98%  Weight: 195 lb 3.2 oz (88.5 kg)  Height: 5\' 6"  (1.676 m)     Physical Exam Vitals reviewed.  Constitutional:      Appearance: Normal appearance. She is well-developed.  HENT:     Head: Normocephalic and atraumatic.  Eyes:     Conjunctiva/sclera: Conjunctivae normal.     Pupils: Pupils are equal, round, and reactive to light.  Neck:     Vascular: No carotid bruit.  Cardiovascular:     Rate and Rhythm: Normal rate and regular rhythm.     Heart sounds: Normal heart sounds.  Pulmonary:     Effort: Pulmonary effort is normal.     Breath sounds: Normal breath sounds.  Abdominal:     Palpations: Abdomen is soft. There is no pulsatile mass.     Tenderness: There is no abdominal tenderness.  Musculoskeletal:     Right lower leg: Edema present.     Left lower leg: No edema.     Comments: Trace edema right greater than left leg but no calf pain, no cords, negative Homans.  Right knee, tender palpation medial joint line with trace effusion.  Intact motion to 90 degrees flexion, full extension.  Ambulating without assistive device.  Skin:    General: Skin is warm and dry.  Neurological:     Mental Status: She is alert and oriented to person, place, and time.  Psychiatric:        Mood and Affect: Mood normal.        Behavior: Behavior normal.       49 minutes spent during visit, including chart review, counseling and assimilation of information, exam, discussion of plan of multiple chronic concerns and additional concerns as above including leg swelling, and chart completion.   Assessment & Plan:  Dhvani Stephenson is a 67 y.o. female . Attention deficit hyperactivity disorder (ADHD), predominantly inattentive type - Plan: amphetamine-dextroamphetamine (ADDERALL XR) 10 MG 24 hr capsule  -Overall stable with 40 mg dosing of Adderall XR but anticipate decreased need once she retires.  Will follow-up in approximately 6 weeks, which should be about a month  after retirement and can try tapering back on dosage at that time.  No med changes for now.  Depression with anxiety - Plan: busPIRone (BUSPAR) 10 MG tablet, escitalopram (LEXAPRO) 20 MG tablet  -Overall stable with current combination of Lexapro and BuSpar, but also may be able to cut back on meds once she retires.  Follow-up as above.  Hypokalemia - Plan: potassium chloride (KLOR-CON) 10 MEQ tablet, Comprehensive metabolic panel  -Check labs but has been off her potassium supplement recently.  Refilled.  Adjust plan accordingly.  Right leg swelling Right knee pain, unspecified chronicity - Plan: Diclofenac Sodium 1 % CREA  -History lymphedema, no specific calf tenderness or focal calf swelling, unlikely DVT.  Symptoms are improving with treatment as above.  Knee pain improving with use of topical aceclofenac, okay to continue for now with RTC precautions/ER precautions given.  Vitamin D deficiency  -As above, stable current dosing, continue current vitamin D supplementation.  Essential hypertension, benign  -Stable with  current regimen, continue same  Hepatic steatosis - Plan: Comprehensive metabolic panel  -Overall reassuring imaging as above, with hepatic steatosis likely cause and focal nodular hyperplasia.  Check updated LFTs, consider hepatology follow-up.  Needs flu shot - Plan: Flu Vaccine Trivalent High Dose (Fluad)  Screening for hyperlipidemia - Plan: Comprehensive metabolic panel, Lipid panel  -Check labs and adjust plan accordingly.  Elevated hemoglobin A1c - Plan: Hemoglobin A1c  -Watch diet/activity level, check A1c and adjust plan accordingly.  Meds ordered this encounter  Medications   amphetamine-dextroamphetamine (ADDERALL XR) 10 MG 24 hr capsule    Sig: Take 1 capsule (10 mg total) by mouth daily. Add to 30mg  - total dose 40mg  every day.    Dispense:  30 capsule    Refill:  0   busPIRone (BUSPAR) 10 MG tablet    Sig: TAKE 1-2 TABLETS BY MOUTH 2 TIMES DAILY.     Dispense:  360 tablet    Refill:  1   escitalopram (LEXAPRO) 20 MG tablet    Sig: Take 1 tablet (20 mg total) by mouth daily.    Dispense:  90 tablet    Refill:  0   potassium chloride (KLOR-CON) 10 MEQ tablet    Sig: Take 2 tablets (20 mEq total) by mouth daily.    Dispense:  180 tablet    Refill:  1   Diclofenac Sodium 1 % CREA    Sig: Apply 4 g topically 4 (four) times daily as needed. To knee pain    Dispense:  120 g    Refill:  2   Patient Instructions  I will check labs today and let you know if there are any concerns including liver tests but I expect those to be okay. No medication changes for now.  Once you retire I think returning to just 30 mg of Adderall XR would be an initial first start, and then we can taper back from there if needed.  We can discuss this further at follow-up in the next 6 weeks.  I did send in the 10 mg dose today, and request your refill of the 30 mg dose from your pharmacy when that refill is closer to being due. Restart potassium supplement, but I will check those levels today. Based on last vitamin D level, okay to continue 2000 units daily. Blood pressure is stable today. No change in BuSpar or Lexapro for today.  I expect depression and anxiety symptoms to improve further once you have retired.  Congratulations on the retirement! I am glad to hear that the leg swelling and pain has improved.  Okay to use diclofenac gel 3-4 times per day if needed but follow-up if that does not continue to improve in the next week or 2. If any new or worsening symptoms be seen right away. Take care!     Signed,   Meredith Staggers, MD [facility], [facility] 04/06/23 12:10 PM

## 2023-04-07 LAB — COMPREHENSIVE METABOLIC PANEL
ALT: 23 U/L (ref 0–35)
AST: 23 U/L (ref 0–37)
Albumin: 4.7 g/dL (ref 3.5–5.2)
Alkaline Phosphatase: 67 U/L (ref 39–117)
BUN: 24 mg/dL — ABNORMAL HIGH (ref 6–23)
CO2: 30 meq/L (ref 19–32)
Calcium: 10 mg/dL (ref 8.4–10.5)
Chloride: 93 meq/L — ABNORMAL LOW (ref 96–112)
Creatinine, Ser: 0.86 mg/dL (ref 0.40–1.20)
GFR: 70.07 mL/min (ref >60.00)
Glucose, Bld: 101 mg/dL — ABNORMAL HIGH (ref 70–99)
Potassium: 3.5 meq/L (ref 3.5–5.1)
Sodium: 136 meq/L (ref 135–145)
Total Bilirubin: 0.7 mg/dL (ref 0.2–1.2)
Total Protein: 7.7 g/dL (ref 6.0–8.3)

## 2023-04-07 LAB — LIPID PANEL
Cholesterol: 230 mg/dL — ABNORMAL HIGH (ref 0–200)
HDL: 50.9 mg/dL (ref >39.00)
LDL Cholesterol: 150 mg/dL — ABNORMAL HIGH (ref 0–99)
NonHDL: 178.89
Total CHOL/HDL Ratio: 5
Triglycerides: 143 mg/dL (ref 0.0–149.0)
VLDL: 28.6 mg/dL (ref 0.0–40.0)

## 2023-04-07 LAB — HEMOGLOBIN A1C: Hgb A1c MFr Bld: 6.1 % (ref 4.6–6.5)

## 2023-04-09 ENCOUNTER — Encounter: Payer: Self-pay | Admitting: Family Medicine

## 2023-04-10 ENCOUNTER — Encounter: Payer: Self-pay | Admitting: Family Medicine

## 2023-04-15 ENCOUNTER — Other Ambulatory Visit: Payer: Self-pay | Admitting: Family Medicine

## 2023-04-15 MED ORDER — AMPHETAMINE-DEXTROAMPHET ER 30 MG PO CP24: 30.0000 mg | ORAL_CAPSULE | ORAL | 0 refills | Status: DC

## 2023-04-15 NOTE — Telephone Encounter (Signed)
Refill request noted, controlled substance database reviewed.  Dextroamphetamine 10 mg last filled on 04/11/2023, 30 mg on 03/18/2023.  Will refill 30 mg prescription.  Recently discussed plan.

## 2023-04-20 NOTE — Telephone Encounter (Signed)
 VOB initiated for 2025.

## 2023-04-28 ENCOUNTER — Telehealth: Payer: Self-pay

## 2023-04-28 ENCOUNTER — Other Ambulatory Visit (HOSPITAL_COMMUNITY): Payer: Self-pay

## 2023-04-28 DIAGNOSIS — M25561 Pain in right knee: Secondary | ICD-10-CM

## 2023-04-28 NOTE — Telephone Encounter (Signed)
 Pharmacy Patient Advocate Encounter   Received notification from CoverMyMeds that prior authorization for Diclofenac  Sodium 1% gel is required/requested.   Insurance verification completed.   The patient is insured through [facility] .   Per test claim: PA required; PA submitted to above mentioned insurance via CoverMyMeds Key/confirmation #/EOC [id number] Status is pending

## 2023-04-29 ENCOUNTER — Other Ambulatory Visit (HOSPITAL_COMMUNITY): Payer: Self-pay

## 2023-04-29 ENCOUNTER — Telehealth: Payer: Self-pay

## 2023-04-29 NOTE — Telephone Encounter (Signed)
 Pharmacy Patient Advocate Encounter   Received notification from Pt Calls Messages that prior authorization for Diclofenac  Sodium 1% gel is required/requested.   Insurance verification completed.   The patient is insured through [employer] [facility] .   Per test claim: PA required; PA started via CoverMyMeds. KEY [id number] . Please see clinical question(s) below that I am not finding the answer to in her chart and advise.

## 2023-04-29 NOTE — Telephone Encounter (Signed)
 Please submit info from last office visit on 04/06/2023 if needed as we did discuss use of this medication for knee pain.  That should be covered.  Here is excerpt from that assessment and plan:  Right knee pain, unspecified chronicity - Plan: Diclofenac  Sodium 1 % CREA            -History lymphedema, no specific calf tenderness or focal calf swelling, unlikely DVT.  Symptoms are improving with treatment as above.  Knee pain improving with use of topical aceclofenac, okay to continue for now with RTC precautions/ER precautions given   There was a typo in that note, should read knee pain improving with use of topical diclofenac .

## 2023-04-29 NOTE — Telephone Encounter (Signed)
 Pharmacy Patient Advocate Encounter  Received notification from [facility] that Prior Authorization for Diclofenac  1% gel has beenDENIED.  See denial reason below. No denial letter attached in CMM. Will attach denial letter to Media tab once received.   PA #/Case ID/Reference #: [id number]

## 2023-04-29 NOTE — Telephone Encounter (Signed)
 Please advise is there more information we could include to change this outcome or do you feel she should try another therapy?

## 2023-04-29 NOTE — Telephone Encounter (Signed)
 Additional information provided for PA

## 2023-04-29 NOTE — Telephone Encounter (Signed)
 Prior Authorization form/request asks a question that requires your assistance. Please see the question below and advise accordingly.

## 2023-05-01 NOTE — Telephone Encounter (Signed)
 [employer] plan termed 04/19/23  Update insurance

## 2023-05-02 NOTE — Telephone Encounter (Signed)
 Please advise if patient has a diagnosis of osteoarthritis pain in joints.   Reviewed snapshot and previous Dx codes and do not see this mentioned does patient have this history that you are aware of?

## 2023-05-03 NOTE — Addendum Note (Signed)
 Addended by: Meredith Staggers R on: 05/03/2023 11:59 AM   Modules accepted: Orders

## 2023-05-03 NOTE — Telephone Encounter (Signed)
 I do not see a previous knee x-ray but I am suspicious for osteoarthritis.  I have ordered a right knee x-ray, please call patient and have her get that in the next day or 2 if possible at [facility].  Once I see the results and if it does show some osteoarthritic changes can then add that to the diagnosis for assistance with prior authorization on that medication.  Thanks.

## 2023-05-03 NOTE — Telephone Encounter (Signed)
 Per separate encounter:     PA archived

## 2023-05-03 NOTE — Telephone Encounter (Signed)
 Pt does not need gel anymore notes she is much better other than some swelling, declined Xray   PA TEAM: Please discontinue PA efforts pt declines therapy at this time due to improvement

## 2023-05-06 NOTE — Telephone Encounter (Signed)
LMx1 for patient to call office with new insurance information.

## 2023-05-13 NOTE — Telephone Encounter (Signed)
Insurance update. VOB re-submitted.

## 2023-05-13 NOTE — Telephone Encounter (Deleted)
Catherine Cole

## 2023-05-18 ENCOUNTER — Ambulatory Visit: Payer: 59 | Admitting: Family Medicine

## 2023-05-18 ENCOUNTER — Ambulatory Visit (INDEPENDENT_AMBULATORY_CARE_PROVIDER_SITE_OTHER)
Admission: RE | Admit: 2023-05-18 | Discharge: 2023-05-18 | Disposition: A | Discharge status: Home / Self Care | Payer: 59 | Source: Ambulatory Visit | Attending: Family Medicine | Admitting: Family Medicine

## 2023-05-18 ENCOUNTER — Encounter: Payer: Self-pay | Admitting: Family Medicine

## 2023-05-18 VITALS — BP 138/70 | HR 90 | Temp 97.9°F | Ht 66.0 in | Wt 193.8 lb

## 2023-05-18 DIAGNOSIS — M25561 Pain in right knee: Secondary | ICD-10-CM

## 2023-05-18 DIAGNOSIS — N390 Urinary tract infection, site not specified: Secondary | ICD-10-CM

## 2023-05-18 DIAGNOSIS — F900 Attention-deficit hyperactivity disorder, predominantly inattentive type: Secondary | ICD-10-CM

## 2023-05-18 DIAGNOSIS — F418 Other specified anxiety disorders: Secondary | ICD-10-CM

## 2023-05-18 DIAGNOSIS — R3989 Other symptoms and signs involving the genitourinary system: Secondary | ICD-10-CM

## 2023-05-18 DIAGNOSIS — R319 Hematuria, unspecified: Secondary | ICD-10-CM

## 2023-05-18 LAB — POCT URINALYSIS DIPSTICK
Bilirubin, UA: NEGATIVE
Glucose, UA: NEGATIVE
Ketones, UA: NEGATIVE
Nitrite, UA: NEGATIVE
Protein, UA: POSITIVE — AB
Spec Grav, UA: 1.02 (ref 1.010–1.025)
Urobilinogen, UA: NEGATIVE U/dL — AB
pH, UA: 6 (ref 5.0–8.0)

## 2023-05-18 MED ORDER — AMPHETAMINE-DEXTROAMPHET ER 30 MG PO CP24: 30.0000 mg | ORAL_CAPSULE | ORAL | 0 refills | Status: DC

## 2023-05-18 MED ORDER — CEPHALEXIN 500 MG PO CAPS: 500.0000 mg | ORAL_CAPSULE | Freq: Two times a day (BID) | ORAL | 0 refills | Status: AC

## 2023-05-18 NOTE — Progress Notes (Signed)
Subjective:  Patient ID: Catherine Cole, female    DOB: Mar 29, 1956  Age: 68 y.o. MRN: [id number]  CC:  Chief Complaint  Patient presents with   Medical Management of Chronic Issues    Pt notes no concerns about medication at this time, notes she would like to continue to take Adderall as she notes she doesn't have the ability to get out of bed in the morning    Urinary Frequency    Pt notes a little more frequency as well as some pressure in her lower abdomen when urinating, notes also her urine seems a bit more cloudy,     HPI Catherine Cole presents for   Chronic conditions above as well as acute concern above.  Urinary frequency, lower abdominal pressure As above with some cloudiness of her urine. Symptoms  started about 3 days ago. Pressure to urinate and urgency.  No fever, new back pain.no n/v.  Treatment:none.drinking fluids.  No uti in years.   Attention deficit hyperactivity disorder, depression with anxiety Discussed in December.  Overall stable depression/anxiety symptoms at time of time with Lexapro BuSpar combination.  Plan for retirement at that time and we discussed potentially cutting back on meds once she did retire and less stressors.  Additionally was stable at 40 mg dosing of Adderall XR at last visit with plan for possible decreased dosages once she retired and decreased need for medication anticipated.  Retired 12/31.  Has been an adjustment.  Stopped adderall for 3-4 days - no motivation and felt overwhelmed, even with taking the lexapro/buspar.   Controlled substance database (PDMP) reviewed. No concerns appreciated.            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   Family history of ovarian cancer     Family history of breast cancer    Family history of colon cancer    Family history of kidney cancer    Family history of melanoma    Vitamin D deficiency 12/12/2018   Essential hypertension, benign 10/15/2015   Kidney stone 05/02/2015   Palpitations 02/08/2015   Atypical chest pain 02/08/2015   Exertional dyspnea 02/08/2015   Lower extremity edema 02/08/2015   Mixed dyslipidemia 03/26/2014   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/03/2013   Depression with anxiety 04/26/2012   ADHD (attention deficit hyperactivity disorder) 04/26/2012   Past Medical History:  Diagnosis Date   Adult ADHD    Anemia    Anxiety    Cancer (HCC)    Phreesia 05/13/2020   Complication of anesthesia    Depression    Depression    [employer]reesia 05/13/2020   Family history of breast cancer    Family history of colon cancer    Family history of kidney cancer    Family history of melanoma    Family history of ovarian cancer    Hypertension    IBS (irritable bowel syndrome)    PONV (postoperative nausea and vomiting)    Past Surgical History:  Procedure Laterality Date   ABDOMINAL HYSTERECTOMY N/A    Phreesia 05/13/2020   APPENDECTOMY     12 yrs   BREAST SURGERY N/A    Phreesia 05/13/2020   CHOLECYSTECTOMY  1995   CHOLESTEATOMA EXCISION     13 and 22 yrs  FRACTURE SURGERY     KIDNEY STONE SURGERY  1995   LESION DESTRUCTION N/A 12/03/2015   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;  Surgeon: Peggye Form, DO;  Location: [facility];  Service: Plastics;  Laterality: N/A;   MASTECTOMY W/ SENTINEL NODE BIOPSY Bilateral 07/25/2019   Procedure: BILATERAL MASTECTOMY WITH RIGHT SENTINEL LYMPH NODE BIOPSY, LEFT RISK REDUCING MASTECTOMY;  Surgeon: Harriette Bouillon, MD;  Location: [facility];  Service: General;  Laterality: Bilateral;  PEC BLOCK   TONSILLECTOMY     15 yrs   Allergies  Allergen Reactions   Latex Hives   Demerol Nausea And  Vomiting   Codeine Nausea And Vomiting   Erythromycin Itching and Rash   Neosporin [Neomycin-Polymyxin B Gu] Itching and Rash   Prior to Admission medications   Medication Sig Start Date End Date Taking? Authorizing Provider  amphetamine-dextroamphetamine (ADDERALL XR) 10 MG 24 hr capsule Take 1 capsule (10 mg total) by mouth daily. Add to 30mg  - total dose 40mg  every day. 04/06/23  Yes Shade Flood, MD  amphetamine-dextroamphetamine (ADDERALL XR) 30 MG 24 hr capsule Take 1 capsule (30 mg total) by mouth every morning. 04/15/23  Yes Shade Flood, MD  busPIRone (BUSPAR) 10 MG tablet TAKE 1-2 TABLETS BY MOUTH 2 TIMES DAILY. 04/06/23  Yes Shade Flood, MD  chlorthalidone (HYGROTON) 25 MG tablet TAKE 1 TABLET (25 MG TOTAL) BY MOUTH DAILY. 01/28/23  Yes Shade Flood, MD  Diclofenac Sodium 1 % CREA Apply 4 g topically 4 (four) times daily as needed. To knee pain 04/06/23  Yes Shade Flood, MD  escitalopram (LEXAPRO) 20 MG tablet Take 1 tablet (20 mg total) by mouth daily. 04/06/23  Yes Shade Flood, MD  Incontinence Supply Disposable (DEPEND EASY FIT UNDERGARMENTS) MISC Use daily as directed. Change several times daily as needed. 06/29/18  Yes Lezlie Lye, Meda Coffee, MD  losartan (COZAAR) 50 MG tablet TAKE 1 TABLET BY MOUTH EVERY DAY 12/07/22  Yes Shade Flood, MD  potassium chloride (KLOR-CON) 10 MEQ tablet Take 2 tablets (20 mEq total) by mouth daily. 04/06/23  Yes Shade Flood, MD  triamcinolone cream (KENALOG) 0.1 % Apply 1 Application topically 2 (two) times daily. As needed to itchy rash 11/19/22  Yes Shade Flood, MD  valACYclovir (VALTREX) 500 MG tablet Take 2 tablets by mouth at acute onset for outbreak, followed by 1 tablet once a day x 5 days 01/11/20  Yes Lezlie Lye, Meda Coffee, MD  Vitamin D, Ergocalciferol, (DRISDOL) 1.25 MG (50000 UNIT) CAPS capsule Take 1 capsule (50,000 Units total) by mouth every 7 (seven) days. 01/29/22  Yes Shade Flood, MD    Social History   Socioeconomic History   Marital status: Widowed    Spouse name: Not on file   Number of children: Not on file   Years of education: Not on file   Highest education level: Not on file  Occupational History   Not on file  Tobacco Use   Smoking status: Former    Current packs/day: 1.00    Average packs/day: 1 pack/day for 7.0 years (7.0 ttl pk-yrs)    Types: Cigarettes   Smokeless tobacco: Never   Tobacco comments:    some day smoker when she smoked  Substance and Sexual Activity   Alcohol use: No   Drug use: No   Sexual activity: Never  Other Topics Concern   Not on file  Social History  Narrative   Not on file   Social Drivers of Health   Financial Resource Strain: Not on file  Food Insecurity: Not on file  Transportation Needs: Not on file  Physical Activity: Not on file  Stress: Not on file  Social Connections: Not on file  Intimate Partner Violence: Not on file    Review of Systems Per HPI.  Objective:   Vitals:   05/18/23 1312  BP: 138/70  Pulse: 90  Temp: 97.9 F (36.6 C)  TempSrc: Temporal  SpO2: 97%  Weight: 193 lb 12.8 oz (87.9 kg)  Height: 5\' 6"  (1.676 m)     Physical Exam Constitutional:      Appearance: Normal appearance. She is well-developed.  HENT:     Head: Normocephalic and atraumatic.  Pulmonary:     Effort: Pulmonary effort is normal.  Abdominal:     General: There is no distension.     Palpations: Abdomen is soft.     Tenderness: There is no abdominal tenderness. There is no guarding or rebound.  Skin:    General: Skin is warm.  Neurological:     Mental Status: She is alert and oriented to person, place, and time.  Psychiatric:        Behavior: Behavior normal.       Results for orders placed or performed in visit on 05/18/23  POCT urinalysis dipstick   Collection Time: 05/18/23  1:22 PM  Result Value Ref Range   Color, UA yellow    Clarity, UA cloudy    Glucose, UA Negative Negative    Bilirubin, UA Negative    Ketones, UA Negative    Spec Grav, UA 1.020 1.010 - 1.025   Blood, UA Large    pH, UA 6.0 5.0 - 8.0   Protein, UA Positive (A) Negative   Urobilinogen, UA negative (A) 0.2 or 1.0 E.U./dL   Nitrite, UA Neg    Leukocytes, UA Large (3+) (A) Negative   Appearance     Odor      Assessment & Plan:  Catherine Cole is a 68 y.o. female . Sensation of pressure in bladder area - Plan: POCT urinalysis dipstick Urinary tract infection with hematuria, site unspecified - Plan: cephALEXin (KEFLEX) 500 MG capsule, Urine Culture  -UTI with microscopic hematuria, denies gross hematuria.  No concerning symptoms, reassuring exam.  Start Keflex, check urine culture, handout given.  RTC precautions.  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  I suspect she will tolerate lower dose now that she is not at work, will revert back to just 30 mg/day for now, recheck in 2 months.  Refill ordered.  Meds ordered this encounter  Medications   cephALEXin (KEFLEX) 500 MG capsule    Sig: Take 1 capsule (500 mg total) by mouth 2 (two) times daily for 5 days.    Dispense:  10 capsule    Refill:  0   amphetamine-dextroamphetamine (ADDERALL XR) 30 MG 24 hr capsule    Sig: Take 1 capsule (30 mg total) by mouth every morning.    Dispense:  30 capsule    Refill:  0   Patient Instructions  Thank you for coming in today.  I do think you have a urinary tract infection, start antibiotic as discussed, drink plenty of fluids and see other recommendations below.  Be seen  if any new or worsening symptoms.  Lets stop the 10 mg Adderall for now, and see if the 30 mg dose is sufficient by itself.  I suspect that will be.  Follow-up in 2 months and we can see how  that is going as well as review any other chronic medical concerns if needed or recheck labs if needed.    If any concerns or new symptoms in the meantime please follow-up sooner.  Take care.  Congratulations again on the retirement!    Signed,   Meredith Staggers, MD [facility], [facility] 05/18/23 1:35 PM

## 2023-05-18 NOTE — Patient Instructions (Addendum)
Thank you for coming in today.  I do think you have a urinary tract infection, start antibiotic as discussed, drink plenty of fluids and see other recommendations below.  Be seen if any new or worsening symptoms.  Lets stop the 10 mg Adderall for now, and see if the 30 mg dose is sufficient by itself.  I suspect that will be.  Follow-up in 2 months and we can see how that is going as well as review any other chronic medical concerns if needed or recheck labs if needed.    If any concerns or new symptoms in the meantime please follow-up sooner.  Take care.  Congratulations again on the retirement!   Urinary Tract Infection, Female A urinary tract infection (UTI) is an infection in your urinary tract. The urinary tract is made up of organs that make, store, and get rid of pee (urine) in your body. These organs include: The kidneys. The ureters. The bladder. The urethra. What are the causes? Most UTIs are caused by germs called bacteria. They may be in or near your genitals. These germs grow and cause swelling in your urinary tract. What increases the risk? You're more likely to get a UTI if: You're a female. The urethra is shorter in females than in males. You have a soft tube called a catheter that drains your pee. You can't control when you pee or poop. You have trouble peeing because of: A kidney stone. A urinary blockage. A nerve condition that affects your bladder. Not getting enough to drink. You're sexually active. You use a birth control inside your vagina, like spermicide. You're pregnant. You have low levels of the hormone estrogen in your body. You're an older adult. You're also more likely to get a UTI if you have other health problems. These may include: Diabetes. A weak immune system. Your immune system is your body's defense system. Sickle cell disease. Injury of the spine. What are the signs or symptoms? Symptoms may include: Needing to pee right away. Peeing small  amounts often. Pain or burning when you pee. Blood in your pee. Pee that smells bad or odd. Pain in your belly or lower back. You may also: Feel confused. This may be the first symptom in older adults. Vomit. Not feel hungry. Feel tired or easily annoyed. Have a fever or chills. How is this diagnosed? A UTI is diagnosed based on your medical history and an exam. You may also have other tests. These may include: Pee tests. Blood tests. Tests for sexually transmitted infections (STIs). If you've had more than one UTI, you may need to have imaging studies done to find out why you keep getting them. How is this treated? A UTI can be treated by: Taking antibiotics or other medicines. Drinking enough fluid to keep your pee pale yellow. In rare cases, a UTI can cause a very bad condition called sepsis. Sepsis may be treated in the hospital. Follow these instructions at home: Medicines Take your medicines only as told by your health care provider. If you were given antibiotics, take them as told by your provider. Do not stop taking them even if you start to feel better. General instructions Make sure you: Pee often and fully. Do not hold your pee for a long time. Wipe from front to back after you pee or poop. Use each tissue only once when you wipe. Pee after you have sex. Do not douche or use sprays or powders in your genital area. Contact a health  care provider if: Your symptoms don't get better after 1-2 days of taking antibiotics. Your symptoms go away and then come back. You have a fever or chills. You vomit or feel like you may vomit. Get help right away if: You have very bad pain in your back or lower belly. You faint. This information is not intended to replace advice given to you by your health care provider. Make sure you discuss any questions you have with your health care provider. Document Revised: 11/11/2022 Document Reviewed: 07/09/2022 Elsevier Patient Education   2024 [employer].

## 2023-05-19 ENCOUNTER — Encounter: Payer: Self-pay | Admitting: Family Medicine

## 2023-05-20 NOTE — Telephone Encounter (Signed)
 Noted

## 2023-05-20 NOTE — Telephone Encounter (Signed)
Patient has chosen to continue monitoring and will provide updates if necessary

## 2023-05-24 ENCOUNTER — Telehealth: Payer: Self-pay

## 2023-05-24 NOTE — Telephone Encounter (Signed)
 [facility] [employer] Stable - Prior Authorization REQUIRED

## 2023-05-24 NOTE — Telephone Encounter (Signed)
 Copied from CRM [id number]. Topic: General - Other >> May 23, 2023  9:24 AM Melissa C wrote: Reason for CRM: Received call regarding Prolia  authorization. They wanted patient's insurance information, I gave them what I saw in patient's chart, however if someone could give a call back with further information to ensure everything is complete for patient. Call back number is  [phone]

## 2023-05-24 NOTE — Telephone Encounter (Signed)
Prior Authorization initiated for [facility] via [employer] Case ID: [id number]

## 2023-05-25 NOTE — Telephone Encounter (Signed)
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

## 2023-05-30 NOTE — Telephone Encounter (Signed)
 Prior Auth for PROLIA  APPROVED PA# [id number] Valid: 05/24/23-05/22/24

## 2023-05-30 NOTE — Telephone Encounter (Signed)
 Pt ready for scheduling on or after 05/24/23  Out-of-pocket cost due at time of visit: $47  Primary: [employer] Prolia  co-insurance: $47 Admin fee co-insurance: n/a  Deductible: does not apply  Prior Auth: APPROVED PA# [id number] Valid: 05/24/23-05/22/24  Secondary: N/A Prolia  co-insurance:  Admin fee co-insurance:  Deductible:  Prior Auth:  PA# Valid:     ** This summary of benefits is an estimation of the patient's out-of-pocket cost. Exact cost may very based on individual plan coverage.

## 2023-06-02 ENCOUNTER — Other Ambulatory Visit: Payer: Self-pay | Admitting: Family Medicine

## 2023-06-02 DIAGNOSIS — I10 Essential (primary) hypertension: Secondary | ICD-10-CM

## 2023-06-13 NOTE — Telephone Encounter (Signed)
 LMx1 to schedule Prolia injection.

## 2023-06-15 NOTE — Telephone Encounter (Signed)
 LMx1 to call office to schedule Prolia injection.

## 2023-06-16 ENCOUNTER — Other Ambulatory Visit: Payer: Self-pay | Admitting: Family Medicine

## 2023-06-16 DIAGNOSIS — F900 Attention-deficit hyperactivity disorder, predominantly inattentive type: Secondary | ICD-10-CM

## 2023-06-16 MED ORDER — AMPHETAMINE-DEXTROAMPHET ER 30 MG PO CP24: 30.0000 mg | ORAL_CAPSULE | ORAL | 0 refills | Status: DC

## 2023-06-16 NOTE — Telephone Encounter (Signed)
 Requested Prescriptions   Pending Prescriptions Disp Refills   amphetamine-dextroamphetamine (ADDERALL XR) 30 MG 24 hr capsule 30 capsule 0    Sig: Take 1 capsule (30 mg total) by mouth every morning.     Date of patient request: 06/16/2023 Last office visit: 05/18/2023 Upcoming visit: 07/18/2023 Date of last refill: 05/18/2023 Last refill amount: 30 tabs 0 refills

## 2023-06-16 NOTE — Telephone Encounter (Signed)
 Controlled substance database reviewed. #30 last filled on 05/18/2023.  Plan discussed at her January 2019 visit.  Refill ordered.

## 2023-06-17 NOTE — Telephone Encounter (Signed)
 LMx1 for patient to call office to schedule nest Prolia injection.

## 2023-06-28 NOTE — Telephone Encounter (Signed)
 Last Prolia injection in 2023  Pt archived in [email or web address].  Please advise if patient and/or provider wish to proceed with Prolia therpay.

## 2023-07-01 ENCOUNTER — Other Ambulatory Visit: Payer: Self-pay | Admitting: Family Medicine

## 2023-07-01 DIAGNOSIS — F418 Other specified anxiety disorders: Secondary | ICD-10-CM

## 2023-07-13 ENCOUNTER — Other Ambulatory Visit: Payer: Self-pay | Admitting: Family Medicine

## 2023-07-13 DIAGNOSIS — F900 Attention-deficit hyperactivity disorder, predominantly inattentive type: Secondary | ICD-10-CM

## 2023-07-13 MED ORDER — AMPHETAMINE-DEXTROAMPHET ER 30 MG PO CP24: 30.0000 mg | ORAL_CAPSULE | ORAL | 0 refills | Status: DC

## 2023-07-13 NOTE — Telephone Encounter (Signed)
 Medication discussed at January 29 visit.  Controlled substance database reviewed.  #30 last filled on 06/19/2023.  Refill ordered to have available when due.

## 2023-07-18 ENCOUNTER — Ambulatory Visit: Payer: 59 | Admitting: Family Medicine

## 2023-07-25 ENCOUNTER — Encounter: Payer: Self-pay | Admitting: Family Medicine

## 2023-07-25 ENCOUNTER — Ambulatory Visit (INDEPENDENT_AMBULATORY_CARE_PROVIDER_SITE_OTHER): Admitting: Family Medicine

## 2023-07-25 VITALS — BP 128/70 | HR 84 | Temp 97.9°F | Ht 66.0 in | Wt 198.8 lb

## 2023-07-25 DIAGNOSIS — F418 Other specified anxiety disorders: Secondary | ICD-10-CM | POA: Diagnosis not present

## 2023-07-25 DIAGNOSIS — R7303 Prediabetes: Secondary | ICD-10-CM | POA: Diagnosis not present

## 2023-07-25 DIAGNOSIS — F900 Attention-deficit hyperactivity disorder, predominantly inattentive type: Secondary | ICD-10-CM

## 2023-07-25 DIAGNOSIS — E785 Hyperlipidemia, unspecified: Secondary | ICD-10-CM | POA: Diagnosis not present

## 2023-07-25 NOTE — Progress Notes (Signed)
 Subjective:  Patient ID: Catherine Cole, female    DOB: 08/09/1955  Age: 68 y.o. MRN: [id number]  CC:  Chief Complaint  Patient presents with   Medical Management of Chronic Issues    Pt is well, no concerns.     HPI Catherine Cole presents for   Attention deficit disorder, anxiety/depression. Last discussed January 29.  Underlying depression/anxiety treated with Lexapro, BuSpar combination.  Retired December 31.  She did try to stop Adderall for a few days but no motivation and felt overwhelmed even with taking Lexapro and BuSpar.  We decreased to 30 mg dose from total of 40 mg at her January 29 visit, is now retired and should require less medication. 30mg  dose ahs been ok overall for focus. Rare days of decreased focus, but usually stable. No palpitations, insomnia, or new side effects with Adderall.  Lexapro and buspar still working well - on buspar 2 per dose at this time, at times can back off to 1.  Controlled substance database (PDMP) reviewed. No concerns appreciated. Adderall last filled 07/19/23.   Chronic conditions were discussed 04/06/2023 visit with labs at that time.  Prediabetes: A1c had increased in December.  Plan for recheck in 3 months. Minimal change in diet/exercise past few months. Trying to eat healthy, but minimal - plans to improve diet. Some exercise with home movements - getting more involved with church - plans to increase activities.  Lab Results  Component Value Date   HGBA1C 6.1 04/06/2023   Wt Readings from Last 3 Encounters:  07/25/23 198 lb 12.8 oz (90.2 kg)  05/18/23 193 lb 12.8 oz (87.9 kg)  04/06/23 195 lb 3.2 oz (88.5 kg)    Hyperlipidemia: Elevated in December.  Plan for recheck in 3 months to decide on meds Lab Results  Component Value Date   CHOL 230 (H) 04/06/2023   HDL 50.90 04/06/2023   LDLCALC 150 (H) 04/06/2023   LDLDIRECT 160 (H) 06/28/2014   TRIG 143.0 04/06/2023   CHOLHDL 5 04/06/2023   Lab Results   Component Value Date   ALT 23 04/06/2023   AST 23 04/06/2023   ALKPHOS 67 04/06/2023   BILITOT 0.7 04/06/2023   HM:  Immunization History  Administered Date(s) Administered   Fluad Quad(high Dose 65+) 01/11/2022   Fluad Trivalent(High Dose 65+) 04/06/2023   Influenza,inj,Quad PF,6+ Mos 12/15/2016, 04/04/2018, 12/12/2018, 04/03/2020, 01/29/2021   Influenza-Unspecified 02/14/2014, 02/11/2016   PFIZER Comirnaty(Gray Top)Covid-19 Tri-Sucrose Vaccine 05/27/2020   PFIZER(Purple Top)SARS-COV-2 Vaccination 12/27/2019   PNEUMOCOCCAL CONJUGATE-20 05/07/2022   Tdap 03/26/2014   Zoster Recombinant(Shingrix) 04/04/2020, 10/13/2020   Mammogram  - hx of bilateral mastectomy. Not indicated.  Colonoscopy 10 years.     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   Family history of ovarian cancer    Family history of breast cancer    Family history of colon cancer    Family history of kidney cancer    Family history of melanoma    Vitamin D deficiency 12/12/2018   Essential hypertension, benign 10/15/2015   Kidney stone 05/02/2015   Palpitations 02/08/2015   Atypical chest pain 02/08/2015   Exertional dyspnea 02/08/2015   Lower extremity edema 02/08/2015   Mixed dyslipidemia 03/26/2014   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/03/2013   Depression with anxiety  04/26/2012   ADHD (attention deficit hyperactivity disorder) 04/26/2012   Past Medical History:  Diagnosis Date   Adult ADHD    Anemia    Anxiety    Cancer (HCC)    Phreesia 05/13/2020   Complication of anesthesia    Depression    Depression    [employer]reesia 05/13/2020   Family history of breast cancer    Family history of colon cancer    Family history of kidney cancer    Family history of melanoma     Family history of ovarian cancer    Hypertension    IBS (irritable bowel syndrome)    PONV (postoperative nausea and vomiting)    Past Surgical History:  Procedure Laterality Date   ABDOMINAL HYSTERECTOMY N/A    Phreesia 05/13/2020   APPENDECTOMY     12 yrs   BREAST SURGERY N/A    Phreesia 05/13/2020   CHOLECYSTECTOMY  1995   CHOLESTEATOMA EXCISION     13 and 22 yrs   FRACTURE SURGERY     KIDNEY STONE SURGERY  1995   LESION DESTRUCTION N/A 12/03/2015   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;  Surgeon: Peggye Form, DO;  Location: [facility];  Service: Plastics;  Laterality: N/A;   MASTECTOMY W/ SENTINEL NODE BIOPSY Bilateral 07/25/2019   Procedure: BILATERAL MASTECTOMY WITH RIGHT SENTINEL LYMPH NODE BIOPSY, LEFT RISK REDUCING MASTECTOMY;  Surgeon: Harriette Bouillon, MD;  Location: [facility];  Service: General;  Laterality: Bilateral;  PEC BLOCK   TONSILLECTOMY     15 yrs   Allergies  Allergen Reactions   Latex Hives   Demerol Nausea And Vomiting   Codeine Nausea And Vomiting   Erythromycin Itching and Rash   Neosporin [Neomycin-Polymyxin B Gu] Itching and Rash   Prior to Admission medications   Medication Sig Start Date End Date Taking? Authorizing Provider  amphetamine-dextroamphetamine (ADDERALL XR) 30 MG 24 hr capsule Take 1 capsule (30 mg total) by mouth every morning. 07/13/23  Yes Shade Flood, MD  busPIRone (BUSPAR) 10 MG tablet TAKE 1-2 TABLETS BY MOUTH 2 TIMES DAILY. 04/06/23  Yes Shade Flood, MD  chlorthalidone (HYGROTON) 25 MG tablet TAKE 1 TABLET (25 MG TOTAL) BY MOUTH DAILY. 01/28/23  Yes Shade Flood, MD  Diclofenac Sodium 1 % CREA Apply 4 g topically 4 (four) times daily as needed. To knee pain 04/06/23  Yes Shade Flood, MD  escitalopram (LEXAPRO) 20 MG tablet TAKE 1 TABLET BY MOUTH EVERY DAY 07/01/23  Yes Shade Flood, MD  Incontinence Supply Disposable (DEPEND EASY FIT  UNDERGARMENTS) MISC Use daily as directed. Change several times daily as needed. 06/29/18  Yes Lezlie Lye, Meda Coffee, MD  losartan (COZAAR) 50 MG tablet TAKE 1 TABLET BY MOUTH EVERY DAY 06/02/23  Yes Shade Flood, MD  potassium chloride (KLOR-CON) 10 MEQ tablet Take 2 tablets (20 mEq total) by mouth daily. 04/06/23  Yes Shade Flood, MD  triamcinolone cream (KENALOG) 0.1 % Apply 1 Application topically 2 (two) times daily. As needed to itchy rash 11/19/22  Yes Shade Flood, MD  valACYclovir (VALTREX) 500 MG tablet Take 2 tablets by mouth at acute onset for outbreak, followed by 1 tablet once a day x 5 days 01/11/20  Yes Lezlie Lye, Meda Coffee, MD  Vitamin D, Ergocalciferol, (DRISDOL) 1.25 MG (50000 UNIT) CAPS capsule Take 1 capsule (50,000 Units total) by mouth every 7 (seven) days. 01/29/22  Yes Shade Flood,  MD   Social History   Socioeconomic History   Marital status: Widowed    Spouse name: Not on file   Number of children: Not on file   Years of education: Not on file   Highest education level: Not on file  Occupational History   Not on file  Tobacco Use   Smoking status: Former    Current packs/day: 1.00    Average packs/day: 1 pack/day for 7.0 years (7.0 ttl pk-yrs)    Types: Cigarettes   Smokeless tobacco: Never   Tobacco comments:    some day smoker when she smoked  Substance and Sexual Activity   Alcohol use: No   Drug use: No   Sexual activity: Never  Other Topics Concern   Not on file  Social History Narrative   Not on file   Social Drivers of Health   Financial Resource Strain: Not on file  Food Insecurity: Not on file  Transportation Needs: Not on file  Physical Activity: Not on file  Stress: Not on file  Social Connections: Not on file  Intimate Partner Violence: Not on file    Review of Systems  Per HPI Objective:   Vitals:   07/25/23 1049  BP: 128/70  Pulse: 84  Temp: 97.9 F (36.6 C)  TempSrc: Temporal  SpO2: 97%  Weight: 198  lb 12.8 oz (90.2 kg)  Height: 5\' 6"  (1.676 m)     Physical Exam Vitals reviewed.  Constitutional:      Appearance: Normal appearance. She is well-developed.  HENT:     Head: Normocephalic and atraumatic.  Eyes:     Conjunctiva/sclera: Conjunctivae normal.     Pupils: Pupils are equal, round, and reactive to light.  Neck:     Vascular: No carotid bruit.  Cardiovascular:     Rate and Rhythm: Normal rate and regular rhythm.     Heart sounds: Normal heart sounds.  Pulmonary:     Effort: Pulmonary effort is normal.     Breath sounds: Normal breath sounds.  Abdominal:     Palpations: Abdomen is soft. There is no pulsatile mass.     Tenderness: There is no abdominal tenderness.  Musculoskeletal:     Right lower leg: No edema.     Left lower leg: No edema.  Skin:    General: Skin is warm and dry.  Neurological:     Mental Status: She is alert and oriented to person, place, and time.  Psychiatric:        Mood and Affect: Mood normal.        Behavior: Behavior normal.      Assessment & Plan:  Catherine Cole is a 68 y.o. female . Attention deficit hyperactivity disorder (ADHD), predominantly inattentive type  - Stable with current dose of Adderall, will continue same for now with recheck in 3 months, attempt to taper further at that time if still doing well.  Depression with anxiety  - Overall stable with Lexapro, BuSpar combination, and variable option on BuSpar dosing.  No changes for now.  Communication with family discussed but I do think that boundary setting and her managing stressors appears to be going well.  Prediabetes Hyperlipidemia, unspecified hyperlipidemia type  - Elevations on most recent labs but minimal change in diet/exercise since that time.  Does plan on changes with activity, working on diet, so we will delay repeat labs for 3 months.  No changes for now.  No orders of the defined types were placed in  this encounter.  Patient Instructions   Continue to watch diet, increased activity/exercise may help prediabetes, cholesterol. We can repeat testing in 3 months.   No change in Adderall dose for now, or other meds. Continue to take care of yourself, boundary setting and communication with family may be helpful to come up with a plan on response on texting/contact.   Take care!    Signed,   Meredith Staggers, MD [facility], [facility] 07/25/23 11:37 AM

## 2023-07-25 NOTE — Patient Instructions (Signed)
 Continue to watch diet, increased activity/exercise may help prediabetes, cholesterol. We can repeat testing in 3 months.   No change in Adderall dose for now, or other meds. Continue to take care of yourself, boundary setting and communication with family may be helpful to come up with a plan on response on texting/contact.   Take care!

## 2023-08-13 ENCOUNTER — Other Ambulatory Visit: Payer: Self-pay | Admitting: Family Medicine

## 2023-08-13 DIAGNOSIS — I10 Essential (primary) hypertension: Secondary | ICD-10-CM

## 2023-08-17 ENCOUNTER — Other Ambulatory Visit: Payer: Self-pay | Admitting: Family Medicine

## 2023-08-17 DIAGNOSIS — F900 Attention-deficit hyperactivity disorder, predominantly inattentive type: Secondary | ICD-10-CM

## 2023-08-17 MED ORDER — AMPHETAMINE-DEXTROAMPHET ER 30 MG PO CP24
30.0000 mg | ORAL_CAPSULE | ORAL | 0 refills | Status: DC
Start: 2023-08-17 — End: 2023-09-16

## 2023-08-17 NOTE — Telephone Encounter (Signed)
 Requested Prescriptions   Pending Prescriptions Disp Refills   amphetamine -dextroamphetamine  (ADDERALL XR) 30 MG 24 hr capsule 30 capsule 0    Sig: Take 1 capsule (30 mg total) by mouth every morning.     Date of patient request: 08/17/2023 Last office visit: 07/25/2023 Upcoming visit: 10/24/2023 Date of last refill: 07/13/2023 Last refill amount: 30

## 2023-08-17 NOTE — Telephone Encounter (Signed)
 Controlled substance database reviewed.  Dextroamphetamine  amphetamine  extended release 30 mg #30 last filled on 07/19/2023, previously 06/19/2023, no concerns.  Recent office visit April 7.  Refill ordered.

## 2023-09-16 ENCOUNTER — Other Ambulatory Visit: Payer: Self-pay | Admitting: Family Medicine

## 2023-09-16 DIAGNOSIS — F900 Attention-deficit hyperactivity disorder, predominantly inattentive type: Secondary | ICD-10-CM

## 2023-09-16 MED ORDER — AMPHETAMINE-DEXTROAMPHET ER 30 MG PO CP24: 30.0000 mg | ORAL_CAPSULE | ORAL | 0 refills | Status: DC

## 2023-09-16 NOTE — Telephone Encounter (Signed)
 Medication discussed April 7.  58-month follow-up planned.  Controlled substance database reviewed.  #30 last filled on 08/17/2023, refill ordered.

## 2023-09-16 NOTE — Telephone Encounter (Signed)
 Medication: Adderall Directions: Take 1 capsule (30 mg total) by mouth every morning.  Last given: 08/17/2023 Number refills: 0 Last o/v: 07/25/2023 Follow up: Return in about 3 months (around 10/24/2023  Labs:  04/06/2023  Please review refill request.

## 2023-09-16 NOTE — Telephone Encounter (Signed)
 Copied from CRM [id number]. Topic: Clinical - Medication Refill >> Sep 16, 2023 10:23 AM Freya Jesus wrote: Medication: amphetamine -dextroamphetamine  (ADDERALL XR) 30 MG 24 hr capsule [[id number]]  Has the patient contacted their pharmacy? Yes (Agent: If no, request that the patient contact the pharmacy for the refill. If patient does not wish to contact the pharmacy document the reason why and proceed with request.) (Agent: If yes, when and what did the pharmacy advise?) Call provider  This is the patient's preferred pharmacy:  [id number] - [place], [place] - [address] AT [address]NER OF [facility] [address] [place] [address] Phone: [phone] Fax: [phone]  Is this the correct pharmacy for this prescription? Yes If no, delete pharmacy and type the correct one.   Has the prescription been filled recently? No  Is the patient out of the medication? Yes  Has the patient been seen for an appointment in the last year OR does the patient have an upcoming appointment? Yes  Can we respond through MyChart? Yes  Agent: Please be advised that Rx refills may take up to 3 business days. We ask that you follow-up with your pharmacy.

## 2023-09-29 ENCOUNTER — Other Ambulatory Visit: Payer: Self-pay | Admitting: Family Medicine

## 2023-09-29 DIAGNOSIS — F418 Other specified anxiety disorders: Secondary | ICD-10-CM

## 2023-10-24 ENCOUNTER — Ambulatory Visit: Admitting: Family Medicine

## 2023-10-26 DIAGNOSIS — R194 Change in bowel habit: Secondary | ICD-10-CM | POA: Insufficient documentation

## 2023-10-26 DIAGNOSIS — K625 Hemorrhage of anus and rectum: Secondary | ICD-10-CM | POA: Insufficient documentation

## 2023-10-27 ENCOUNTER — Encounter: Payer: Self-pay | Admitting: Family Medicine

## 2023-10-27 ENCOUNTER — Ambulatory Visit (INDEPENDENT_AMBULATORY_CARE_PROVIDER_SITE_OTHER): Admitting: Family Medicine

## 2023-10-27 VITALS — BP 124/68 | HR 89 | Temp 98.0°F | Resp 15 | Ht 66.0 in | Wt 199.4 lb

## 2023-10-27 DIAGNOSIS — E876 Hypokalemia: Secondary | ICD-10-CM

## 2023-10-27 DIAGNOSIS — R5383 Other fatigue: Secondary | ICD-10-CM | POA: Diagnosis not present

## 2023-10-27 DIAGNOSIS — F418 Other specified anxiety disorders: Secondary | ICD-10-CM

## 2023-10-27 DIAGNOSIS — R7303 Prediabetes: Secondary | ICD-10-CM | POA: Diagnosis not present

## 2023-10-27 DIAGNOSIS — F900 Attention-deficit hyperactivity disorder, predominantly inattentive type: Secondary | ICD-10-CM

## 2023-10-27 DIAGNOSIS — E559 Vitamin D deficiency, unspecified: Secondary | ICD-10-CM | POA: Diagnosis not present

## 2023-10-27 DIAGNOSIS — E785 Hyperlipidemia, unspecified: Secondary | ICD-10-CM | POA: Diagnosis not present

## 2023-10-27 LAB — COMPREHENSIVE METABOLIC PANEL WITH GFR
ALT: 24 U/L (ref 0–35)
AST: 18 U/L (ref 0–37)
Albumin: 4.8 g/dL (ref 3.5–5.2)
Alkaline Phosphatase: 73 U/L (ref 39–117)
BUN: 19 mg/dL (ref 6–23)
CO2: 30 meq/L (ref 19–32)
Calcium: 10 mg/dL (ref 8.4–10.5)
Chloride: 96 meq/L (ref 96–112)
Creatinine, Ser: 0.81 mg/dL (ref 0.40–1.20)
GFR: 75 mL/min (ref >60.00)
Glucose, Bld: 130 mg/dL — ABNORMAL HIGH (ref 70–99)
Potassium: 3.6 meq/L (ref 3.5–5.1)
Sodium: 137 meq/L (ref 135–145)
Total Bilirubin: 0.7 mg/dL (ref 0.2–1.2)
Total Protein: 7.5 g/dL (ref 6.0–8.3)

## 2023-10-27 LAB — VITAMIN D 25 HYDROXY (VIT D DEFICIENCY, FRACTURES): VITD: 24.25 ng/mL — ABNORMAL LOW (ref 30.00–100.00)

## 2023-10-27 LAB — LIPID PANEL
Cholesterol: 282 mg/dL — ABNORMAL HIGH (ref 0–200)
HDL: 53.6 mg/dL (ref >39.00)
LDL Cholesterol: 187 mg/dL — ABNORMAL HIGH (ref 0–99)
NonHDL: 228.19
Total CHOL/HDL Ratio: 5
Triglycerides: 205 mg/dL — ABNORMAL HIGH (ref 0.0–149.0)
VLDL: 41 mg/dL — ABNORMAL HIGH (ref 0.0–40.0)

## 2023-10-27 LAB — CBC
HCT: 41.9 % (ref 36.0–46.0)
Hemoglobin: 14.1 g/dL (ref 12.0–15.0)
MCHC: 33.7 g/dL (ref 30.0–36.0)
MCV: 87.1 fl (ref 78.0–100.0)
Platelets: 268 K/uL (ref 150.0–400.0)
RBC: 4.8 Mil/uL (ref 3.87–5.11)
RDW: 13.9 % (ref 11.5–15.5)
WBC: 6.9 K/uL (ref 4.0–10.5)

## 2023-10-27 LAB — HEMOGLOBIN A1C: Hgb A1c MFr Bld: 6 % (ref 4.6–6.5)

## 2023-10-27 LAB — VITAMIN B12: Vitamin B-12: 236 pg/mL (ref 211–911)

## 2023-10-27 MED ORDER — BUSPIRONE HCL 10 MG PO TABS: ORAL_TABLET | ORAL | 1 refills | Status: AC

## 2023-10-27 MED ORDER — POTASSIUM CHLORIDE ER 10 MEQ PO TBCR: 20.0000 meq | EXTENDED_RELEASE_TABLET | Freq: Every day | ORAL | 1 refills | Status: AC

## 2023-10-27 NOTE — Progress Notes (Signed)
 Subjective:  Patient ID: Catherine Cole, female    DOB: 02/29/1956  Age: 68 y.o. MRN: [id number]  CC:  Chief Complaint  Patient presents with   Medical Management of Chronic Issues    Pt is doing well, pt notes she did stop adderall a week and half ago,     HPI Catherine Cole presents for   Attention deficit hyperactivity disorder, anxiety, depression Treated with Lexapro , BuSpar .  Retired in December, tried tapering off Adderall initially after retirement but overwhelmed and decreased motivation, restarted and we have been decreasing her dose, tapering back on Adderall.  Dosage has been decreased from 40 to 30 mg and was doing okay at her April visit.  BuSpar  has been used from 1 to 2 pills per dose - still same dosing 1 or 2 at a time. Still taking lexapro  once per day.   Stopped taking adderall about a week ago - off for past week. Minimal change in focus off meds.  Felt anxious one Sunday few weeks ago heart was racing, BP elevated. Had taken adderall and accidentally had more caffeine with caffeinated coffee.  No recurrence. No CP/dyspnea. No return of fast HR or palpitations.  Increasing water intake.  Taking B vitamin, vitamin D . Some fatigue at times for awhile.  No CP/palpitations/melena/hematochezia. Prior vitamin D  deficiency with reading of 24 in January 2024, most recently 30 in August.     10/27/2023   11:02 AM 07/25/2023   10:48 AM 05/18/2023    1:07 PM 04/06/2023   11:56 AM  GAD 7 : Generalized Anxiety Score  Nervous, Anxious, on Edge 0 0 0 1  Control/stop worrying 0 0 0   Worry too much - different things 0 0 0 1  Trouble relaxing 0 0 0 0  Restless 0 0 0 0  Easily annoyed or irritable 0 0 0 1  Afraid - awful might happen 0 0 0 0  Total GAD 7 Score 0 0 0   Anxiety Difficulty Not difficult at all          10/27/2023   11:02 AM 07/25/2023   10:47 AM 05/18/2023    1:07 PM 04/06/2023   11:55 AM 01/24/2023    4:08 PM  Depression screen PHQ 2/9   Decreased Interest 0 0 0 1 1  Down, Depressed, Hopeless 1 0 0 0 0  PHQ - 2 Score 1 0 0 1 1  Altered sleeping 1 0 0 1 2  Tired, decreased energy 0 0 0 1 1  Change in appetite 0 0 0 1 1  Feeling bad or failure about yourself  0 0 0 0 1  Trouble concentrating 1 0 0 0 0  Moving slowly or fidgety/restless 0 0 0 0 0  Suicidal thoughts 0 0 0 0   PHQ-9 Score 3 0 0 4 6  Difficult doing work/chores Not difficult at all          Hyperlipidemia: Prior elevation in December, repeat labs planned after initial diet/exercise approach.  With planned changes at her April visit we deferred labs for a few more months. Has been more conscious about diet, eating healthier. More walking. More active at home, no changes at church yet.    Lab Results  Component Value Date   CHOL 230 (H) 04/06/2023   HDL 50.90 04/06/2023   LDLCALC 150 (H) 04/06/2023   LDLDIRECT 160 (H) 06/28/2014   TRIG 143.0 04/06/2023   CHOLHDL 5 04/06/2023   Lab Results  Component Value Date   ALT 23 04/06/2023   AST 23 04/06/2023   ALKPHOS 67 04/06/2023   BILITOT 0.7 04/06/2023    Prediabetes: Diet/exercise approach, weight overall stable.  Plan going becoming more involved with church with increase activities and exercise when discussed in April. 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   Family history of ovarian cancer    Family history of breast cancer    Family history of colon cancer    Family history of kidney cancer     Family history of melanoma    Vitamin D  deficiency 12/12/2018   Essential hypertension, benign 10/15/2015   Kidney stone 05/02/2015   Palpitations 02/08/2015   Atypical chest pain 02/08/2015   Exertional dyspnea 02/08/2015   Lower extremity edema 02/08/2015   Mixed dyslipidemia 03/26/2014   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/03/2013   Depression with anxiety 04/26/2012   ADHD (attention deficit hyperactivity disorder) 04/26/2012   Past Medical History:  Diagnosis Date   Adult ADHD    Anemia    Anxiety    Cancer (HCC)    Phreesia 05/13/2020   Complication of anesthesia    Depression    Depression    Phreesia 05/13/2020   Family history of breast cancer    Family history of colon cancer    Family history of kidney cancer    Family history of melanoma    Family history of ovarian cancer    Hypertension    IBS (irritable bowel syndrome)    PONV (postoperative nausea and vomiting)    Past Surgical History:  Procedure Laterality Date   ABDOMINAL HYSTERECTOMY N/A    Phreesia 05/13/2020   APPENDECTOMY     12 yrs   BREAST SURGERY N/A    Phreesia 05/13/2020   CHOLECYSTECTOMY  1995   CHOLESTEATOMA EXCISION     13 and 22 yrs   FRACTURE SURGERY     KIDNEY STONE SURGERY  1995   LESION DESTRUCTION N/A 12/03/2015   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;  Surgeon: Estefana GORMAN Fritter, DO;  Location: [facility];  Service: Plastics;  Laterality: N/A;   MASTECTOMY W/ SENTINEL NODE BIOPSY Bilateral 07/25/2019   Procedure: BILATERAL MASTECTOMY WITH RIGHT SENTINEL LYMPH NODE BIOPSY, LEFT RISK REDUCING MASTECTOMY;  Surgeon: Vanderbilt Ned, MD;  Location: [facility];  Service: General;  Laterality: Bilateral;  PEC BLOCK   TONSILLECTOMY     15 yrs   Allergies  Allergen Reactions   Latex Hives   Demerol Nausea And Vomiting   Codeine Nausea And Vomiting   Erythromycin Itching and Rash   Neosporin [Neomycin-Polymyxin B Gu]  Itching and Rash   Prior to Admission medications   Medication Sig Start Date End Date Taking? Authorizing Provider  busPIRone  (BUSPAR ) 10 MG tablet TAKE 1-2 TABLETS BY MOUTH 2 TIMES DAILY. 04/06/23  Yes Levora Reyes SAUNDERS, MD  chlorthalidone  (HYGROTON ) 25 MG tablet TAKE 1 TABLET (  25 MG TOTAL) BY MOUTH DAILY. 08/15/23  Yes Levora Reyes SAUNDERS, MD  Diclofenac  Sodium 1 % CREA Apply 4 g topically 4 (four) times daily as needed. To knee pain 04/06/23  Yes Levora Reyes SAUNDERS, MD  escitalopram  (LEXAPRO ) 20 MG tablet TAKE 1 TABLET BY MOUTH EVERY DAY 09/29/23  Yes Levora Reyes SAUNDERS, MD  Incontinence Supply Disposable (DEPEND EASY FIT UNDERGARMENTS) MISC Use daily as directed. Change several times daily as needed. 06/29/18  Yes Melonie Colonel, Mikel HERO, MD  losartan  (COZAAR ) 50 MG tablet TAKE 1 TABLET BY MOUTH EVERY DAY 06/02/23  Yes Levora Reyes SAUNDERS, MD  potassium chloride  (KLOR-CON ) 10 MEQ tablet Take 2 tablets (20 mEq total) by mouth daily. 04/06/23  Yes Levora Reyes SAUNDERS, MD  triamcinolone  cream (KENALOG ) 0.1 % Apply 1 Application topically 2 (two) times daily. As needed to itchy rash 11/19/22  Yes Levora Reyes SAUNDERS, MD  valACYclovir  (VALTREX ) 500 MG tablet Take 2 tablets by mouth at acute onset for outbreak, followed by 1 tablet once a day x 5 days 01/11/20  Yes Melonie Colonel, Mikel HERO, MD  Vitamin D , Ergocalciferol , (DRISDOL ) 1.25 MG (50000 UNIT) CAPS capsule Take 1 capsule (50,000 Units total) by mouth every 7 (seven) days. 01/29/22  Yes Levora Reyes SAUNDERS, MD  amphetamine -dextroamphetamine  (ADDERALL XR) 30 MG 24 hr capsule Take 1 capsule (30 mg total) by mouth every morning. Patient not taking: Reported on 10/27/2023 09/16/23   Levora Reyes SAUNDERS, MD   Social History   Socioeconomic History   Marital status: Widowed    Spouse name: Not on file   Number of children: Not on file   Years of education: Not on file   Highest education level: Bachelor's degree (e.g., BA, AB, BS)  Occupational History   Not on file   Tobacco Use   Smoking status: Former    Current packs/day: 1.00    Average packs/day: 1 pack/day for 7.0 years (7.0 ttl pk-yrs)    Types: Cigarettes   Smokeless tobacco: Never   Tobacco comments:    some day smoker when she smoked  Substance and Sexual Activity   Alcohol use: No   Drug use: No   Sexual activity: Never  Other Topics Concern   Not on file  Social History Narrative   Not on file   Social Drivers of Health   Financial Resource Strain: Low Risk  (10/23/2023)   Overall Financial Resource Strain (CARDIA)    Difficulty of Paying Living Expenses: Not very hard  Food Insecurity: No Food Insecurity (10/23/2023)   Hunger Vital Sign    Worried About Running Out of Food in the Last Year: Never true    Ran Out of Food in the Last Year: Never true  Transportation Needs: No Transportation Needs (10/23/2023)   PRAPARE - Administrator, Civil Service (Medical): No    Lack of Transportation (Non-Medical): No  Physical Activity: Inactive (10/23/2023)   Exercise Vital Sign    Days of Exercise per Week: 0 days    Minutes of Exercise per Session: Not on file  Stress: No Stress Concern Present (10/23/2023)   [employer] of Occupational Health - Occupational Stress Questionnaire    Feeling of Stress: Only a little  Social Connections: Moderately Integrated (10/23/2023)   Social Connection and Isolation Panel    Frequency of Communication with Friends and Family: Three times a week    Frequency of Social Gatherings with Friends and Family: Once a week    Attends  Religious Services: More than 4 times per year    Active Member of Clubs or Organizations: Yes    Attends Banker Meetings: More than 4 times per year    Marital Status: Widowed  Intimate Partner Violence: Not on file    Review of Systems Per HPI.   Objective:   Vitals:   10/27/23 1045  BP: 124/68  Pulse: 89  Resp: 15  Temp: 98 F (36.7 C)  TempSrc: Temporal  SpO2: 97%  Weight: 199 lb  6.4 oz (90.4 kg)  Height: 5' 6 (1.676 m)     Physical Exam Vitals reviewed.  Constitutional:      Appearance: Normal appearance. She is well-developed.  HENT:     Head: Normocephalic and atraumatic.  Eyes:     Conjunctiva/sclera: Conjunctivae normal.     Pupils: Pupils are equal, round, and reactive to light.  Neck:     Vascular: No carotid bruit.  Cardiovascular:     Rate and Rhythm: Normal rate and regular rhythm.     Heart sounds: Normal heart sounds.  Pulmonary:     Effort: Pulmonary effort is normal.     Breath sounds: Normal breath sounds.  Abdominal:     Palpations: Abdomen is soft. There is no pulsatile mass.     Tenderness: There is no abdominal tenderness.  Musculoskeletal:     Right lower leg: No edema.     Left lower leg: No edema.  Skin:    General: Skin is warm and dry.  Neurological:     Mental Status: She is alert and oriented to person, place, and time.  Psychiatric:        Mood and Affect: Mood normal.        Behavior: Behavior normal.        Assessment & Plan:  Catherine Cole is a 68 y.o. female . Depression with anxiety  - overall stable with Lexapro , BuSpar , continue same regimen.  Attention deficit hyperactivity disorder (ADHD), predominantly inattentive type  - Doing well at this time off meds, option to restart lower dose Adderall if recurrent focus difficulty.  Prediabetes - Plan: Hemoglobin A1c  - Check A1c, continue diet/exercise approach.  Hyperlipidemia, unspecified hyperlipidemia type - Plan: Comprehensive metabolic panel with GFR, Lipid panel  - No med changes at this time, check updated labs and adjust plan accordingly.  Vitamin D  deficiency - Plan: Vitamin D  (25 hydroxy) Fatigue, unspecified type - Plan: B12, Vitamin D  (25 hydroxy), CBC  - Prior vitamin D  deficiency, check a B12, vitamin D , CBC with history of fatigue, RTC precautions of changes or worsening.  Hypokalemia  - Continue supplementation, check labs and  adjust med doses accordingly.  No orders of the defined types were placed in this encounter.  Patient Instructions  Ok to stay off adderall for now. If you are having a significant change in focus, especially if interfering with your usual activities, let me know and we can a lower dose- 10-20mg  may be an option.   Thank you for coming in today. No change in medications at this time. If there are any concerns on your bloodwork, I will let you know. Take care!  I will check some labs for fatigue but if that persist please schedule separate visit so we can discuss that further.  If any return of elevated blood pressure or fast heart rate be seen numbers look okay today and your exam was reassuring today.  Take care!    Signed,  Reyes Pines, MD [facility], [facility] 10/27/23 11:19 AM

## 2023-10-27 NOTE — Patient Instructions (Addendum)
 Ok to stay off adderall for now. If you are having a significant change in focus, especially if interfering with your usual activities, let me know and we can a lower dose- 10-20mg  may be an option.   Thank you for coming in today. No change in medications at this time. If there are any concerns on your bloodwork, I will let you know. Take care!  I will check some labs for fatigue but if that persist please schedule separate visit so we can discuss that further.  If any return of elevated blood pressure or fast heart rate be seen numbers look okay today and your exam was reassuring today.  Take care!

## 2023-10-30 ENCOUNTER — Ambulatory Visit: Payer: Self-pay | Admitting: Family Medicine

## 2023-12-03 ENCOUNTER — Other Ambulatory Visit: Payer: Self-pay | Admitting: Family Medicine

## 2023-12-03 DIAGNOSIS — I10 Essential (primary) hypertension: Secondary | ICD-10-CM

## 2023-12-14 NOTE — Telephone Encounter (Signed)
 Called patient to discuss med/plan. Left VM - I will try to reach her again in next few days to discuss options.

## 2023-12-14 NOTE — Telephone Encounter (Unsigned)
 Copied from CRM [id number]. Topic: Clinical - Medication Question >> Dec 13, 2023  4:21 PM Sophia H wrote: Reason for CRM: Patient states she is wanting a call back from nurse to discuss starting back up on adderall again, states she had stopped taking it back in July thinking she would be okay but she has been struggling. Per office visit notes on 07/10 Dr. Levora Belton to stay off adderall for now. If you are having a significant change in focus, especially if interfering with your usual activities, let me know and we can a lower dose- 10-20mg  may be an option.   Please advise patient, ty. # [id number]   [id number] - [place],  - [address] OF [facility] [address]

## 2023-12-18 NOTE — Telephone Encounter (Signed)
 Called pt - left VM - I can try to reach her again on Tuesday - advised her to let me know of a good time to call that day if possible

## 2023-12-20 MED ORDER — AMPHETAMINE-DEXTROAMPHET ER 10 MG PO CP24: 10.0000 mg | ORAL_CAPSULE | Freq: Every day | ORAL | 0 refills | Status: DC

## 2023-12-20 NOTE — Telephone Encounter (Addendum)
 Called pt -she is having more difficulty with focus and would like to restart Adderall.  Potential side effects and risks have been discussed.  Also discussed possibly increasing her Lexapro  but she is already maxed at 20 mg.  We discussed that untreated ADD may contribute to some of her anxiety.  For now we will try 1 change at a time with addition of Adderall and recheck in the next few weeks.  PDMP system is not accessible at this time.   She also noted that she is having some left arm pain, will have her seen in the next few weeks to check on status of add/anxiety and arm pain.

## 2023-12-20 NOTE — Addendum Note (Signed)
 Addended by: Munira Polson R on: 12/20/2023 11:56 AM   Modules accepted: Orders

## 2023-12-20 NOTE — Telephone Encounter (Signed)
 Called patient to schedule appt for left arm pain and ADD. Left vm to return call

## 2023-12-21 NOTE — Telephone Encounter (Signed)
 Patient scheduled f/u appt with Dr. Levora in January.

## 2023-12-27 ENCOUNTER — Other Ambulatory Visit: Payer: Self-pay | Admitting: Family Medicine

## 2023-12-27 DIAGNOSIS — F418 Other specified anxiety disorders: Secondary | ICD-10-CM

## 2024-01-16 NOTE — Telephone Encounter (Signed)
 I have tried to call pt to let her know that I got P/A approval for CT scan. No answer on home phone and work # reroutes to home #. No answer and no voice mail box set up.

## 2024-01-16 NOTE — Telephone Encounter (Signed)
 I left message for pt on sisters phone as noted in chart at [phone]. I got P/A for Ct scan to be done, could pt please call me back since I have not been able to reach her per her phone # with full mail box.

## 2024-01-20 ENCOUNTER — Other Ambulatory Visit: Payer: Self-pay | Admitting: Family Medicine

## 2024-01-20 DIAGNOSIS — I10 Essential (primary) hypertension: Secondary | ICD-10-CM

## 2024-01-20 MED ORDER — AMPHETAMINE-DEXTROAMPHET ER 10 MG PO CP24: 10.0000 mg | ORAL_CAPSULE | Freq: Every day | ORAL | 0 refills | Status: DC

## 2024-01-20 NOTE — Telephone Encounter (Signed)
 Requested Prescriptions   Pending Prescriptions Disp Refills   amphetamine -dextroamphetamine  (ADDERALL XR) 10 MG 24 hr capsule 30 capsule 0    Sig: Take 1 capsule (10 mg total) by mouth daily.     Date of patient request: 01/20/2024 Last office visit: 10/27/2023 Upcoming visit: 05/03/2024 Date of last refill: 12/20/2023 Last refill amount: 30

## 2024-01-20 NOTE — Telephone Encounter (Signed)
 Controlled substance database reviewed.  #30 of dextroamphetamine  dextroamphetamine  ER last filled on 12/20/2023.  Office visit 10/27/2023.  Option to restart Adderall at lower dose if return of ADHD symptoms.  10 mg dose was last filled on September 2 as above, refill ordered.

## 2024-01-20 NOTE — Telephone Encounter (Signed)
 Copied from CRM [id number]. Topic: Clinical - Medication Refill >> Jan 20, 2024  3:03 PM Paige D wrote: Medication:  amphetamine -dextroamphetamine  (ADDERALL XR) 10 MG 24 hr capsule   Has the patient contacted their pharmacy? Yes (Agent: If no, request that the patient contact the pharmacy for the refill. If patient does not wish to contact the pharmacy document the reason why and proceed with request.) (Agent: If yes, when and what did the pharmacy advise?)  This is the patient's preferred pharmacy:  [id number] - [place], [place] - [address] OF [facility] [address] [place] [address] Phone: [phone] Fax: [phone]  Is this the correct pharmacy for this prescription? Yes If no, delete pharmacy and type the correct one.   Has the prescription been filled recently? No  Is the patient out of the medication? Yes  Has the patient been seen for an appointment in the last year OR does the patient have an upcoming appointment? Yes  Can we respond through MyChart? Yes  Agent: Please be advised that Rx refills may take up to 3 business days. We ask that you follow-up with your pharmacy.

## 2024-02-22 ENCOUNTER — Other Ambulatory Visit: Payer: Self-pay | Admitting: Family Medicine

## 2024-02-22 DIAGNOSIS — I10 Essential (primary) hypertension: Secondary | ICD-10-CM

## 2024-02-22 NOTE — Telephone Encounter (Unsigned)
 Copied from CRM [id number]. Topic: Clinical - Medication Refill >> Feb 22, 2024  1:46 PM Macario HERO wrote: Medication: amphetamine -dextroamphetamine  (ADDERALL XR) 10 MG 24 hr capsule [[id number]]  Has the patient contacted their pharmacy? Yes (Agent: If no, request that the patient contact the pharmacy for the refill. If patient does not wish to contact the pharmacy document the reason why and proceed with request.) (Agent: If yes, when and what did the pharmacy advise?)  This is the patient's preferred pharmacy:  [id number] - [place], [place] - [address] AT [address] OF [facility] [address] [place] [address] Phone: [phone] Fax: [phone]  Is this the correct pharmacy for this prescription? Yes If no, delete pharmacy and type the correct one.   Has the prescription been filled recently? Yes  Is the patient out of the medication? Yes  Has the patient been seen for an appointment in the last year OR does the patient have an upcoming appointment? Yes  Can we respond through MyChart? Yes  Agent: Please be advised that Rx refills may take up to 3 business days. We ask that you follow-up with your pharmacy.

## 2024-02-22 NOTE — Telephone Encounter (Signed)
 Requested Prescriptions   Pending Prescriptions Disp Refills   amphetamine -dextroamphetamine  (ADDERALL XR) 10 MG 24 hr capsule 30 capsule 0    Sig: Take 1 capsule (10 mg total) by mouth daily.     Date of patient request: 02/22/24 Last office visit: 10/27/2023 Upcoming visit: 05/03/2024 Date of last refill: 01/20/24 Last refill amount: 30

## 2024-02-23 MED ORDER — AMPHETAMINE-DEXTROAMPHET ER 10 MG PO CP24: 10.0000 mg | ORAL_CAPSULE | Freq: Every day | ORAL | 0 refills | Status: DC

## 2024-02-23 NOTE — Telephone Encounter (Signed)
 Controlled substance database reviewed.  #30 last filled on 01/20/2024.  Medication discussed at July 10 visit.  Refill ordered.

## 2024-03-23 ENCOUNTER — Other Ambulatory Visit: Payer: Self-pay | Admitting: Family Medicine

## 2024-03-23 DIAGNOSIS — I10 Essential (primary) hypertension: Secondary | ICD-10-CM

## 2024-03-23 NOTE — Telephone Encounter (Signed)
 Copied from CRM [id number]. Topic: Clinical - Medication Refill >> Mar 23, 2024  2:36 PM Deaijah H wrote: Medication: amphetamine -dextroamphetamine  (ADDERALL XR) 10 MG 24 hr capsule  Has the patient contacted their pharmacy? Yes (Agent: If no, request that the patient contact the pharmacy for the refill. If patient does not wish to contact the pharmacy document the reason why and proceed with request.) To call PCP (Agent: If yes, when and what did the pharmacy advise?)  This is the patient's preferred pharmacy:  [id number] - [place], [place] - [address] AT [address]NER OF [facility] [address] [place] [address] Phone: [phone] Fax: [phone]  Is this the correct pharmacy for this prescription? Yes If no, delete pharmacy and type the correct one.   Has the prescription been filled recently? Yes  Is the patient out of the medication? No, 1 left   Has the patient been seen for an appointment in the last year OR does the patient have an upcoming appointment? Yes  Can we respond through MyChart? Yes  Agent: Please be advised that Rx refills may take up to 3 business days. We ask that you follow-up with your pharmacy.

## 2024-03-24 ENCOUNTER — Other Ambulatory Visit: Payer: Self-pay | Admitting: Family Medicine

## 2024-03-24 DIAGNOSIS — F418 Other specified anxiety disorders: Secondary | ICD-10-CM

## 2024-03-26 MED ORDER — AMPHETAMINE-DEXTROAMPHET ER 10 MG PO CP24: 10.0000 mg | ORAL_CAPSULE | Freq: Every day | ORAL | 0 refills | Status: DC

## 2024-03-26 NOTE — Telephone Encounter (Signed)
 Requested Prescriptions   Pending Prescriptions Disp Refills   amphetamine -dextroamphetamine  (ADDERALL XR) 10 MG 24 hr capsule 30 capsule 0    Sig: Take 1 capsule (10 mg total) by mouth daily.     Date of patient request: 03/26/24 Last office visit: 10/27/2023 Upcoming visit: 03/24/2024 Date of last refill: 02/23/24 Last refill amount: 30

## 2024-03-26 NOTE — Telephone Encounter (Signed)
 Medication discussed at her office visit in July.Controlled substance database reviewed.  #30 of dextroamphetamine  dextroamphetamine  extended release 10 mg last filled on 02/23/2024.  No concerns, refilled.

## 2024-04-24 ENCOUNTER — Other Ambulatory Visit: Payer: Self-pay | Admitting: Family Medicine

## 2024-04-24 DIAGNOSIS — I10 Essential (primary) hypertension: Secondary | ICD-10-CM

## 2024-04-24 MED ORDER — AMPHETAMINE-DEXTROAMPHET ER 10 MG PO CP24: 10.0000 mg | ORAL_CAPSULE | Freq: Every day | ORAL | 0 refills | Status: DC

## 2024-04-24 NOTE — Telephone Encounter (Signed)
 Requested Prescriptions   Pending Prescriptions Disp Refills   amphetamine -dextroamphetamine  (ADDERALL XR) 10 MG 24 hr capsule 30 capsule 0    Sig: Take 1 capsule (10 mg total) by mouth daily.     Date of patient request: 04/25/23 Last office visit: 10/27/2023 Upcoming visit: 05/03/2024 Date of last refill: 03/26/24 Last refill amount: 30

## 2024-04-24 NOTE — Telephone Encounter (Signed)
 Copied from CRM [id number]. Topic: Clinical - Medication Refill >> Apr 24, 2024 11:02 AM Adelita E wrote: Medication: amphetamine -dextroamphetamine  (ADDERALL XR) 10 MG 24 hr capsule  Has the patient contacted their pharmacy? No (Agent: If no, request that the patient contact the pharmacy for the refill. If patient does not wish to contact the pharmacy document the reason why and proceed with request.) (Agent: If yes, when and what did the pharmacy advise?)  This is the patient's preferred pharmacy:  [id number] - [place], [place] - [address] AT [address] OF [facility] [address] [place] [address] Phone: [phone] Fax: [phone]  Is this the correct pharmacy for this prescription? Yes If no, delete pharmacy and type the correct one.   Has the prescription been filled recently? No  Is the patient out of the medication? Yes  Has the patient been seen for an appointment in the last year OR does the patient have an upcoming appointment? Yes  Can we respond through MyChart? Yes  Agent: Please be advised that Rx refills may take up to 3 business days. We ask that you follow-up with your pharmacy.

## 2024-04-24 NOTE — Telephone Encounter (Signed)
 Medication discussed at July 10 visit.  Option of lower dose Adderall restart, restarted September 2, controlled substance database reviewed, consistent refills from 9/2, 10/3, 11/6, 03/26/2024.  Refill ordered.

## 2024-04-29 ENCOUNTER — Other Ambulatory Visit: Payer: Self-pay | Admitting: Family Medicine

## 2024-04-29 DIAGNOSIS — I10 Essential (primary) hypertension: Secondary | ICD-10-CM

## 2024-04-30 ENCOUNTER — Telehealth: Payer: Self-pay | Admitting: Family Medicine

## 2024-04-30 NOTE — Telephone Encounter (Signed)
 Pharmacy does not have medication but they do have immediate release available. Okay to change?

## 2024-04-30 NOTE — Telephone Encounter (Unsigned)
 Copied from CRM #[id number]. Topic: Clinical - Prescription Issue >> Apr 30, 2024 12:42 PM Catherine Cole wrote: Reason for CRM: patient calling because the pharmacy stated the amphetamine -dextroamphetamine  (ADDERALL XR) 10 MG 24 hr capsule Is on back order. They do have the immediate release but a new Rx will have to be sent in for it to be filled.

## 2024-05-01 MED ORDER — AMPHETAMINE-DEXTROAMPHETAMINE 10 MG PO TABS: 10.0000 mg | ORAL_TABLET | Freq: Every day | ORAL | 0 refills | Status: AC

## 2024-05-01 NOTE — Telephone Encounter (Signed)
 Potentially can try the immediate release, but she will not have the sustained effects from that formulation compared to extended release.  I will send in the 10 mg immediate release temporarily but let me know how she does with that dose.

## 2024-05-01 NOTE — Telephone Encounter (Signed)
 LM to call back so we can discuss Dr Perrin message and recommendation

## 2024-05-02 NOTE — Telephone Encounter (Signed)
 LM to call back so we can discuss Dr Perrin message and recommendation

## 2024-05-02 NOTE — Telephone Encounter (Signed)
 Patient called back and was informed notes she is okay with this and will follow up again later notes this is due to shortage on the XR formulation

## 2024-05-03 ENCOUNTER — Encounter: Admitting: Family Medicine

## 2024-07-05 ENCOUNTER — Encounter: Admitting: Family Medicine
